# Patient Record
Sex: Female | Born: 1940
Health system: Southern US, Community
[De-identification: ages and names within clinical notes are randomized; demographics above are authoritative.]

## PROBLEM LIST (undated history)

## (undated) DIAGNOSIS — M1990 Unspecified osteoarthritis, unspecified site: Secondary | ICD-10-CM

## (undated) DIAGNOSIS — D239 Other benign neoplasm of skin, unspecified: Secondary | ICD-10-CM

## (undated) DIAGNOSIS — M8580 Other specified disorders of bone density and structure, unspecified site: Secondary | ICD-10-CM

## (undated) DIAGNOSIS — K209 Esophagitis, unspecified without bleeding: Secondary | ICD-10-CM

## (undated) DIAGNOSIS — K222 Esophageal obstruction: Secondary | ICD-10-CM

## (undated) DIAGNOSIS — K219 Gastro-esophageal reflux disease without esophagitis: Secondary | ICD-10-CM

## (undated) DIAGNOSIS — K449 Diaphragmatic hernia without obstruction or gangrene: Secondary | ICD-10-CM

## (undated) DIAGNOSIS — IMO0002 Reserved for concepts with insufficient information to code with codable children: Secondary | ICD-10-CM

## (undated) DIAGNOSIS — H269 Unspecified cataract: Secondary | ICD-10-CM

## (undated) DIAGNOSIS — T7840XA Allergy, unspecified, initial encounter: Secondary | ICD-10-CM

## (undated) DIAGNOSIS — F32A Depression, unspecified: Secondary | ICD-10-CM

## (undated) DIAGNOSIS — F419 Anxiety disorder, unspecified: Secondary | ICD-10-CM

## (undated) DIAGNOSIS — Z86010 Personal history of colonic polyps: Secondary | ICD-10-CM

## (undated) DIAGNOSIS — G4730 Sleep apnea, unspecified: Secondary | ICD-10-CM

## (undated) DIAGNOSIS — K5792 Diverticulitis of intestine, part unspecified, without perforation or abscess without bleeding: Secondary | ICD-10-CM

## (undated) DIAGNOSIS — H6990 Unspecified Eustachian tube disorder, unspecified ear: Secondary | ICD-10-CM

## (undated) DIAGNOSIS — N952 Postmenopausal atrophic vaginitis: Secondary | ICD-10-CM

## (undated) DIAGNOSIS — I341 Nonrheumatic mitral (valve) prolapse: Secondary | ICD-10-CM

## (undated) DIAGNOSIS — D649 Anemia, unspecified: Secondary | ICD-10-CM

## (undated) DIAGNOSIS — K5790 Diverticulosis of intestine, part unspecified, without perforation or abscess without bleeding: Secondary | ICD-10-CM

## (undated) DIAGNOSIS — J189 Pneumonia, unspecified organism: Secondary | ICD-10-CM

## (undated) DIAGNOSIS — K269 Duodenal ulcer, unspecified as acute or chronic, without hemorrhage or perforation: Secondary | ICD-10-CM

## (undated) DIAGNOSIS — H6980 Other specified disorders of Eustachian tube, unspecified ear: Secondary | ICD-10-CM

## (undated) DIAGNOSIS — J45909 Unspecified asthma, uncomplicated: Secondary | ICD-10-CM

## (undated) DIAGNOSIS — R42 Dizziness and giddiness: Secondary | ICD-10-CM

## (undated) DIAGNOSIS — E785 Hyperlipidemia, unspecified: Secondary | ICD-10-CM

## (undated) HISTORY — DX: Dizziness and giddiness: R42

## (undated) HISTORY — DX: Nonrheumatic mitral (valve) prolapse: I34.1

## (undated) HISTORY — DX: Diverticulosis of intestine, part unspecified, without perforation or abscess without bleeding: K57.90

## (undated) HISTORY — DX: Other specified disorders of bone density and structure, unspecified site: M85.80

## (undated) HISTORY — DX: Unspecified osteoarthritis, unspecified site: M19.90

## (undated) HISTORY — DX: Unspecified eustachian tube disorder, unspecified ear: H69.90

## (undated) HISTORY — DX: Anemia, unspecified: D64.9

## (undated) HISTORY — DX: Personal history of colonic polyps: Z86.010

## (undated) HISTORY — DX: Diverticulitis of intestine, part unspecified, without perforation or abscess without bleeding: K57.92

## (undated) HISTORY — DX: Postmenopausal atrophic vaginitis: N95.2

## (undated) HISTORY — DX: Gastro-esophageal reflux disease without esophagitis: K21.9

## (undated) HISTORY — DX: Reserved for concepts with insufficient information to code with codable children: IMO0002

## (undated) HISTORY — DX: Other specified disorders of Eustachian tube, unspecified ear: H69.80

## (undated) HISTORY — DX: Duodenal ulcer, unspecified as acute or chronic, without hemorrhage or perforation: K26.9

## (undated) HISTORY — DX: Sleep apnea, unspecified: G47.30

## (undated) HISTORY — DX: Allergy, unspecified, initial encounter: T78.40XA

## (undated) HISTORY — DX: Esophagitis, unspecified without bleeding: K20.90

## (undated) HISTORY — DX: Diaphragmatic hernia without obstruction or gangrene: K44.9

## (undated) HISTORY — DX: Unspecified cataract: H26.9

## (undated) HISTORY — DX: Esophageal obstruction: K22.2

## (undated) HISTORY — DX: Esophagitis, unspecified: K20.9

---
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
## 1967-05-14 HISTORY — PX: BREAST BIOPSY: SHX20

## 1974-05-13 HISTORY — PX: SEPTOPLASTY: SUR1290

## 1987-05-14 HISTORY — PX: APPENDECTOMY: SHX54

## 1993-05-13 DIAGNOSIS — K222 Esophageal obstruction: Secondary | ICD-10-CM

## 1993-05-13 HISTORY — DX: Esophageal obstruction: K22.2

## 1997-08-10 ENCOUNTER — Other Ambulatory Visit: Admission: RE | Admit: 1997-08-10 | Discharge: 1997-08-10 | Payer: Self-pay | Admitting: Obstetrics & Gynecology

## 1998-08-15 ENCOUNTER — Other Ambulatory Visit: Admission: RE | Admit: 1998-08-15 | Discharge: 1998-08-15 | Payer: Self-pay | Admitting: Obstetrics & Gynecology

## 1999-05-14 HISTORY — PX: ABDOMINAL HYSTERECTOMY: SHX81

## 1999-09-10 ENCOUNTER — Other Ambulatory Visit: Admission: RE | Admit: 1999-09-10 | Discharge: 1999-09-10 | Payer: Self-pay | Admitting: Obstetrics & Gynecology

## 1999-09-24 ENCOUNTER — Inpatient Hospital Stay (HOSPITAL_COMMUNITY): Admission: RE | Admit: 1999-09-24 | Discharge: 1999-09-28 | Payer: Self-pay | Admitting: Obstetrics & Gynecology

## 2000-04-03 ENCOUNTER — Ambulatory Visit: Admission: RE | Admit: 2000-04-03 | Discharge: 2000-04-03 | Payer: Self-pay | Admitting: Obstetrics & Gynecology

## 2000-04-04 ENCOUNTER — Ambulatory Visit (HOSPITAL_COMMUNITY): Admission: RE | Admit: 2000-04-04 | Discharge: 2000-04-04 | Payer: Self-pay | Admitting: Obstetrics & Gynecology

## 2000-04-04 ENCOUNTER — Encounter: Payer: Self-pay | Admitting: Obstetrics & Gynecology

## 2000-04-12 HISTORY — PX: LAPAROSCOPY: SHX197

## 2000-04-15 ENCOUNTER — Encounter: Payer: Self-pay | Admitting: Obstetrics & Gynecology

## 2000-04-15 ENCOUNTER — Ambulatory Visit (HOSPITAL_COMMUNITY): Admission: RE | Admit: 2000-04-15 | Discharge: 2000-04-15 | Payer: Self-pay | Admitting: Obstetrics & Gynecology

## 2000-04-25 ENCOUNTER — Ambulatory Visit (HOSPITAL_COMMUNITY): Admission: RE | Admit: 2000-04-25 | Discharge: 2000-04-25 | Payer: Self-pay | Admitting: Obstetrics & Gynecology

## 2000-04-25 ENCOUNTER — Encounter (INDEPENDENT_AMBULATORY_CARE_PROVIDER_SITE_OTHER): Payer: Self-pay | Admitting: Specialist

## 2002-09-21 ENCOUNTER — Other Ambulatory Visit: Admission: RE | Admit: 2002-09-21 | Discharge: 2002-09-21 | Payer: Self-pay | Admitting: Obstetrics & Gynecology

## 2003-03-10 ENCOUNTER — Ambulatory Visit (HOSPITAL_COMMUNITY): Admission: RE | Admit: 2003-03-10 | Discharge: 2003-03-11 | Payer: Self-pay | Admitting: *Deleted

## 2003-09-13 ENCOUNTER — Ambulatory Visit (HOSPITAL_COMMUNITY): Admission: RE | Admit: 2003-09-13 | Discharge: 2003-09-13 | Payer: Self-pay | Admitting: Orthopedic Surgery

## 2004-01-12 HISTORY — PX: CERVICAL DISCECTOMY: SHX98

## 2004-01-12 HISTORY — PX: SHOULDER SURGERY: SHX246

## 2004-01-19 ENCOUNTER — Inpatient Hospital Stay (HOSPITAL_COMMUNITY): Admission: RE | Admit: 2004-01-19 | Discharge: 2004-01-20 | Payer: Self-pay | Admitting: Neurosurgery

## 2004-03-22 ENCOUNTER — Ambulatory Visit: Payer: Self-pay | Admitting: Internal Medicine

## 2004-05-01 ENCOUNTER — Ambulatory Visit: Payer: Self-pay | Admitting: Internal Medicine

## 2004-09-12 ENCOUNTER — Ambulatory Visit: Payer: Self-pay | Admitting: Internal Medicine

## 2004-09-19 ENCOUNTER — Ambulatory Visit: Payer: Self-pay | Admitting: Internal Medicine

## 2004-09-25 ENCOUNTER — Ambulatory Visit: Payer: Self-pay | Admitting: Family Medicine

## 2004-10-11 DIAGNOSIS — G4730 Sleep apnea, unspecified: Secondary | ICD-10-CM

## 2004-10-11 HISTORY — DX: Sleep apnea, unspecified: G47.30

## 2004-10-12 ENCOUNTER — Ambulatory Visit: Payer: Self-pay | Admitting: Internal Medicine

## 2004-10-17 ENCOUNTER — Ambulatory Visit (HOSPITAL_BASED_OUTPATIENT_CLINIC_OR_DEPARTMENT_OTHER): Admission: RE | Admit: 2004-10-17 | Discharge: 2004-10-17 | Payer: Self-pay | Admitting: Internal Medicine

## 2004-10-26 ENCOUNTER — Ambulatory Visit: Payer: Self-pay | Admitting: Pulmonary Disease

## 2004-12-24 ENCOUNTER — Ambulatory Visit: Payer: Self-pay | Admitting: Pulmonary Disease

## 2005-03-06 ENCOUNTER — Ambulatory Visit: Payer: Self-pay | Admitting: Internal Medicine

## 2005-09-12 ENCOUNTER — Ambulatory Visit: Payer: Self-pay | Admitting: Internal Medicine

## 2005-09-23 ENCOUNTER — Ambulatory Visit: Payer: Self-pay | Admitting: Internal Medicine

## 2006-01-21 ENCOUNTER — Ambulatory Visit: Payer: Self-pay | Admitting: Internal Medicine

## 2006-02-28 ENCOUNTER — Ambulatory Visit: Payer: Self-pay | Admitting: Internal Medicine

## 2006-04-12 HISTORY — PX: OTHER SURGICAL HISTORY: SHX169

## 2006-04-18 ENCOUNTER — Encounter (INDEPENDENT_AMBULATORY_CARE_PROVIDER_SITE_OTHER): Payer: Self-pay | Admitting: Specialist

## 2006-04-18 ENCOUNTER — Ambulatory Visit (HOSPITAL_BASED_OUTPATIENT_CLINIC_OR_DEPARTMENT_OTHER): Admission: RE | Admit: 2006-04-18 | Discharge: 2006-04-18 | Payer: Self-pay | Admitting: Orthopedic Surgery

## 2006-08-26 ENCOUNTER — Encounter: Admission: RE | Admit: 2006-08-26 | Discharge: 2006-08-26 | Payer: Self-pay | Admitting: Neurosurgery

## 2006-09-01 ENCOUNTER — Ambulatory Visit: Payer: Self-pay | Admitting: Internal Medicine

## 2006-09-16 ENCOUNTER — Ambulatory Visit: Payer: Self-pay | Admitting: Internal Medicine

## 2006-10-01 ENCOUNTER — Ambulatory Visit: Payer: Self-pay | Admitting: Internal Medicine

## 2006-10-01 LAB — CONVERTED CEMR LAB
BUN: 14 mg/dL (ref 6–23)
Basophils Absolute: 0 10*3/uL (ref 0.0–0.1)
Basophils Relative: 0.4 % (ref 0.0–1.0)
CO2: 31 meq/L (ref 19–32)
Calcium: 9.4 mg/dL (ref 8.4–10.5)
Chloride: 104 meq/L (ref 96–112)
Cholesterol: 246 mg/dL (ref 0–200)
Creatinine, Ser: 0.7 mg/dL (ref 0.4–1.2)
Direct LDL: 115 mg/dL
Eosinophils Absolute: 0.2 10*3/uL (ref 0.0–0.6)
Eosinophils Relative: 4.5 % (ref 0.0–5.0)
GFR calc Af Amer: 107 mL/min
GFR calc non Af Amer: 89 mL/min
Glucose, Bld: 101 mg/dL — ABNORMAL HIGH (ref 70–99)
HCT: 40.8 % (ref 36.0–46.0)
HDL: 92 mg/dL (ref >39.0)
Hemoglobin: 14 g/dL (ref 12.0–15.0)
Lymphocytes Relative: 30 % (ref 12.0–46.0)
MCHC: 34.2 g/dL (ref 30.0–36.0)
MCV: 91.9 fL (ref 78.0–100.0)
Monocytes Absolute: 0.4 10*3/uL (ref 0.2–0.7)
Monocytes Relative: 7.9 % (ref 3.0–11.0)
Neutro Abs: 3.2 10*3/uL (ref 1.4–7.7)
Neutrophils Relative %: 57.2 % (ref 43.0–77.0)
Platelets: 224 10*3/uL (ref 150–400)
Potassium: 4.5 meq/L (ref 3.5–5.1)
RBC: 4.44 M/uL (ref 3.87–5.11)
RDW: 12.5 % (ref 11.5–14.6)
Sodium: 140 meq/L (ref 135–145)
Total CHOL/HDL Ratio: 2.7
Triglycerides: 46 mg/dL (ref 0–149)
VLDL: 9 mg/dL (ref 0–40)
WBC: 5.5 10*3/uL (ref 4.5–10.5)

## 2006-10-09 ENCOUNTER — Ambulatory Visit: Payer: Self-pay | Admitting: Internal Medicine

## 2006-10-14 ENCOUNTER — Ambulatory Visit: Payer: Self-pay | Admitting: Internal Medicine

## 2006-11-10 ENCOUNTER — Ambulatory Visit: Payer: Self-pay | Admitting: Internal Medicine

## 2006-12-29 ENCOUNTER — Encounter: Payer: Self-pay | Admitting: Internal Medicine

## 2006-12-29 DIAGNOSIS — Z8719 Personal history of other diseases of the digestive system: Secondary | ICD-10-CM | POA: Insufficient documentation

## 2006-12-29 DIAGNOSIS — I059 Rheumatic mitral valve disease, unspecified: Secondary | ICD-10-CM | POA: Insufficient documentation

## 2006-12-29 DIAGNOSIS — J309 Allergic rhinitis, unspecified: Secondary | ICD-10-CM | POA: Insufficient documentation

## 2006-12-29 DIAGNOSIS — K219 Gastro-esophageal reflux disease without esophagitis: Secondary | ICD-10-CM | POA: Insufficient documentation

## 2007-02-23 ENCOUNTER — Ambulatory Visit: Payer: Self-pay | Admitting: Internal Medicine

## 2007-06-19 ENCOUNTER — Encounter: Payer: Self-pay | Admitting: Internal Medicine

## 2007-06-30 ENCOUNTER — Encounter: Payer: Self-pay | Admitting: Internal Medicine

## 2007-09-16 ENCOUNTER — Encounter: Payer: Self-pay | Admitting: Internal Medicine

## 2007-11-30 ENCOUNTER — Ambulatory Visit: Payer: Self-pay | Admitting: Internal Medicine

## 2007-11-30 LAB — CONVERTED CEMR LAB
ALT: 26 units/L (ref 0–35)
AST: 34 units/L (ref 0–37)
Albumin: 4.3 g/dL (ref 3.5–5.2)
Alkaline Phosphatase: 66 units/L (ref 39–117)
BUN: 16 mg/dL (ref 6–23)
Basophils Absolute: 0 10*3/uL (ref 0.0–0.1)
Basophils Relative: 0.3 % (ref 0.0–3.0)
Bilirubin Urine: NEGATIVE
Bilirubin, Direct: 0.2 mg/dL (ref 0.0–0.3)
CO2: 30 meq/L (ref 19–32)
Calcium: 9.4 mg/dL (ref 8.4–10.5)
Chloride: 102 meq/L (ref 96–112)
Cholesterol: 253 mg/dL (ref 0–200)
Creatinine, Ser: 0.8 mg/dL (ref 0.4–1.2)
Direct LDL: 117.9 mg/dL
Eosinophils Absolute: 0.1 10*3/uL (ref 0.0–0.7)
Eosinophils Relative: 2.7 % (ref 0.0–5.0)
GFR calc Af Amer: 92 mL/min
GFR calc non Af Amer: 76 mL/min
Glucose, Bld: 100 mg/dL — ABNORMAL HIGH (ref 70–99)
HCT: 38.7 % (ref 36.0–46.0)
HDL: 105.8 mg/dL (ref >39.0)
Hemoglobin, Urine: NEGATIVE
Hemoglobin: 13.2 g/dL (ref 12.0–15.0)
Leukocytes, UA: NEGATIVE
Lymphocytes Relative: 23.6 % (ref 12.0–46.0)
MCHC: 34.1 g/dL (ref 30.0–36.0)
MCV: 94.6 fL (ref 78.0–100.0)
Monocytes Absolute: 0.4 10*3/uL (ref 0.1–1.0)
Monocytes Relative: 7.6 % (ref 3.0–12.0)
Neutro Abs: 3.7 10*3/uL (ref 1.4–7.7)
Neutrophils Relative %: 65.8 % (ref 43.0–77.0)
Nitrite: NEGATIVE
Platelets: 217 10*3/uL (ref 150–400)
Potassium: 4.1 meq/L (ref 3.5–5.1)
RBC: 4.09 M/uL (ref 3.87–5.11)
RDW: 12.6 % (ref 11.5–14.6)
Sodium: 139 meq/L (ref 135–145)
Specific Gravity, Urine: 1.01 (ref 1.000–1.03)
TSH: 1.34 microintl units/mL (ref 0.35–5.50)
Total Bilirubin: 1.5 mg/dL — ABNORMAL HIGH (ref 0.3–1.2)
Total CHOL/HDL Ratio: 2.4
Total Protein, Urine: NEGATIVE mg/dL
Total Protein: 7.1 g/dL (ref 6.0–8.3)
Triglycerides: 41 mg/dL (ref 0–149)
Urine Glucose: NEGATIVE mg/dL
Urobilinogen, UA: 0.2 (ref 0.0–1.0)
VLDL: 8 mg/dL (ref 0–40)
WBC: 5.5 10*3/uL (ref 4.5–10.5)
pH: 5.5 (ref 5.0–8.0)

## 2007-12-08 ENCOUNTER — Ambulatory Visit: Payer: Self-pay | Admitting: Internal Medicine

## 2007-12-08 DIAGNOSIS — M26629 Arthralgia of temporomandibular joint, unspecified side: Secondary | ICD-10-CM | POA: Insufficient documentation

## 2007-12-08 DIAGNOSIS — M949 Disorder of cartilage, unspecified: Secondary | ICD-10-CM

## 2007-12-08 DIAGNOSIS — Z8669 Personal history of other diseases of the nervous system and sense organs: Secondary | ICD-10-CM | POA: Insufficient documentation

## 2007-12-08 DIAGNOSIS — M899 Disorder of bone, unspecified: Secondary | ICD-10-CM | POA: Insufficient documentation

## 2008-02-26 ENCOUNTER — Ambulatory Visit: Payer: Self-pay | Admitting: Internal Medicine

## 2008-03-16 ENCOUNTER — Ambulatory Visit: Payer: Self-pay | Admitting: Internal Medicine

## 2008-03-16 DIAGNOSIS — R0789 Other chest pain: Secondary | ICD-10-CM | POA: Insufficient documentation

## 2008-03-24 ENCOUNTER — Encounter: Payer: Self-pay | Admitting: Internal Medicine

## 2008-03-24 ENCOUNTER — Ambulatory Visit: Payer: Self-pay

## 2008-04-08 ENCOUNTER — Encounter: Payer: Self-pay | Admitting: Internal Medicine

## 2008-06-21 ENCOUNTER — Encounter: Payer: Self-pay | Admitting: Internal Medicine

## 2008-10-11 ENCOUNTER — Telehealth: Payer: Self-pay | Admitting: Internal Medicine

## 2008-11-08 ENCOUNTER — Encounter: Payer: Self-pay | Admitting: Internal Medicine

## 2008-11-08 ENCOUNTER — Ambulatory Visit: Payer: Self-pay | Admitting: Internal Medicine

## 2008-11-22 ENCOUNTER — Telehealth: Payer: Self-pay | Admitting: Internal Medicine

## 2008-11-30 ENCOUNTER — Ambulatory Visit: Payer: Self-pay | Admitting: Internal Medicine

## 2008-11-30 LAB — CONVERTED CEMR LAB
ALT: 22 units/L (ref 0–35)
AST: 27 units/L (ref 0–37)
Albumin: 4.3 g/dL (ref 3.5–5.2)
Alkaline Phosphatase: 81 units/L (ref 39–117)
BUN: 15 mg/dL (ref 6–23)
Basophils Absolute: 0 10*3/uL (ref 0.0–0.1)
Basophils Relative: 0.1 % (ref 0.0–3.0)
Bilirubin Urine: NEGATIVE
Bilirubin, Direct: 0.1 mg/dL (ref 0.0–0.3)
CO2: 33 meq/L — ABNORMAL HIGH (ref 19–32)
Calcium: 9.5 mg/dL (ref 8.4–10.5)
Chloride: 105 meq/L (ref 96–112)
Cholesterol: 231 mg/dL — ABNORMAL HIGH (ref 0–200)
Creatinine, Ser: 0.7 mg/dL (ref 0.4–1.2)
Direct LDL: 107.7 mg/dL
Eosinophils Absolute: 0.2 10*3/uL (ref 0.0–0.7)
Eosinophils Relative: 3 % (ref 0.0–5.0)
GFR calc non Af Amer: 88.04 mL/min (ref >60)
Glucose, Bld: 98 mg/dL (ref 70–99)
HCT: 39.5 % (ref 36.0–46.0)
HDL: 104.1 mg/dL (ref >39.00)
Hemoglobin, Urine: NEGATIVE
Hemoglobin: 13.3 g/dL (ref 12.0–15.0)
Leukocytes, UA: NEGATIVE
Lymphocytes Relative: 27.2 % (ref 12.0–46.0)
Lymphs Abs: 1.4 10*3/uL (ref 0.7–4.0)
MCHC: 33.8 g/dL (ref 30.0–36.0)
MCV: 93.4 fL (ref 78.0–100.0)
Monocytes Absolute: 0.5 10*3/uL (ref 0.1–1.0)
Monocytes Relative: 8.9 % (ref 3.0–12.0)
Neutro Abs: 3.1 10*3/uL (ref 1.4–7.7)
Neutrophils Relative %: 60.8 % (ref 43.0–77.0)
Nitrite: NEGATIVE
Platelets: 204 10*3/uL (ref 150.0–400.0)
Potassium: 4.3 meq/L (ref 3.5–5.1)
RBC: 4.23 M/uL (ref 3.87–5.11)
RDW: 12.5 % (ref 11.5–14.6)
Sodium: 143 meq/L (ref 135–145)
Specific Gravity, Urine: 1.025 (ref 1.000–1.030)
TSH: 0.75 microintl units/mL (ref 0.35–5.50)
Total Bilirubin: 1.3 mg/dL — ABNORMAL HIGH (ref 0.3–1.2)
Total CHOL/HDL Ratio: 2
Total Protein, Urine: NEGATIVE mg/dL
Total Protein: 7.4 g/dL (ref 6.0–8.3)
Triglycerides: 39 mg/dL (ref 0.0–149.0)
Urine Glucose: NEGATIVE mg/dL
Urobilinogen, UA: 0.2 (ref 0.0–1.0)
VLDL: 7.8 mg/dL (ref 0.0–40.0)
WBC: 5.2 10*3/uL (ref 4.5–10.5)
pH: 6 (ref 5.0–8.0)

## 2008-12-08 ENCOUNTER — Ambulatory Visit: Payer: Self-pay | Admitting: Internal Medicine

## 2008-12-09 ENCOUNTER — Telehealth: Payer: Self-pay | Admitting: Internal Medicine

## 2008-12-30 ENCOUNTER — Encounter (INDEPENDENT_AMBULATORY_CARE_PROVIDER_SITE_OTHER): Payer: Self-pay | Admitting: *Deleted

## 2009-02-08 ENCOUNTER — Ambulatory Visit: Payer: Self-pay | Admitting: Internal Medicine

## 2009-02-10 DIAGNOSIS — Z860101 Personal history of adenomatous and serrated colon polyps: Secondary | ICD-10-CM

## 2009-02-10 DIAGNOSIS — Z86010 Personal history of colonic polyps: Secondary | ICD-10-CM

## 2009-02-10 HISTORY — DX: Personal history of colonic polyps: Z86.010

## 2009-02-10 HISTORY — DX: Personal history of adenomatous and serrated colon polyps: Z86.0101

## 2009-02-14 ENCOUNTER — Ambulatory Visit: Payer: Self-pay | Admitting: Internal Medicine

## 2009-02-23 ENCOUNTER — Ambulatory Visit: Payer: Self-pay | Admitting: Internal Medicine

## 2009-02-23 LAB — CONVERTED CEMR LAB
Nitrite: POSITIVE
Specific Gravity, Urine: >=1.03 (ref 1.000–1.030)
Total Protein, Urine: >=300 mg/dL
Urine Glucose: NEGATIVE mg/dL
Urobilinogen, UA: 1 (ref 0.0–1.0)
pH: 5 (ref 5.0–8.0)

## 2009-02-24 ENCOUNTER — Telehealth: Payer: Self-pay | Admitting: Internal Medicine

## 2009-03-06 ENCOUNTER — Encounter: Payer: Self-pay | Admitting: Internal Medicine

## 2009-03-06 ENCOUNTER — Ambulatory Visit: Payer: Self-pay | Admitting: Internal Medicine

## 2009-03-06 LAB — HM COLONOSCOPY

## 2009-03-07 ENCOUNTER — Encounter: Payer: Self-pay | Admitting: Internal Medicine

## 2009-05-20 ENCOUNTER — Ambulatory Visit: Payer: Self-pay | Admitting: Family Medicine

## 2009-05-30 ENCOUNTER — Ambulatory Visit: Payer: Self-pay | Admitting: Internal Medicine

## 2009-06-12 ENCOUNTER — Telehealth: Payer: Self-pay | Admitting: Internal Medicine

## 2009-06-20 ENCOUNTER — Encounter: Payer: Self-pay | Admitting: Internal Medicine

## 2009-06-22 ENCOUNTER — Encounter: Payer: Self-pay | Admitting: Internal Medicine

## 2009-06-22 LAB — HM MAMMOGRAPHY: HM Mammogram: NORMAL

## 2009-06-28 ENCOUNTER — Telehealth (INDEPENDENT_AMBULATORY_CARE_PROVIDER_SITE_OTHER): Payer: Self-pay | Admitting: *Deleted

## 2009-07-26 ENCOUNTER — Ambulatory Visit: Payer: Self-pay | Admitting: Internal Medicine

## 2009-07-31 ENCOUNTER — Telehealth: Payer: Self-pay | Admitting: Internal Medicine

## 2009-12-04 ENCOUNTER — Ambulatory Visit: Payer: Self-pay | Admitting: Internal Medicine

## 2009-12-04 LAB — CONVERTED CEMR LAB
ALT: 19 units/L (ref 0–35)
AST: 26 units/L (ref 0–37)
Albumin: 4.3 g/dL (ref 3.5–5.2)
Alkaline Phosphatase: 68 units/L (ref 39–117)
BUN: 18 mg/dL (ref 6–23)
Basophils Absolute: 0 10*3/uL (ref 0.0–0.1)
Basophils Relative: 0.4 % (ref 0.0–3.0)
Bilirubin Urine: NEGATIVE
Bilirubin, Direct: 0.1 mg/dL (ref 0.0–0.3)
CO2: 28 meq/L (ref 19–32)
Calcium: 9.4 mg/dL (ref 8.4–10.5)
Chloride: 103 meq/L (ref 96–112)
Cholesterol: 250 mg/dL — ABNORMAL HIGH (ref 0–200)
Creatinine, Ser: 0.7 mg/dL (ref 0.4–1.2)
Direct LDL: 123.3 mg/dL
Eosinophils Absolute: 0.1 10*3/uL (ref 0.0–0.7)
Eosinophils Relative: 2.7 % (ref 0.0–5.0)
GFR calc non Af Amer: 87.79 mL/min (ref >60)
Glucose, Bld: 100 mg/dL — ABNORMAL HIGH (ref 70–99)
HCT: 37.8 % (ref 36.0–46.0)
HDL: 107.7 mg/dL (ref >39.00)
Hemoglobin, Urine: NEGATIVE
Hemoglobin: 12.9 g/dL (ref 12.0–15.0)
Ketones, ur: NEGATIVE mg/dL
Leukocytes, UA: NEGATIVE
Lymphocytes Relative: 24.5 % (ref 12.0–46.0)
Lymphs Abs: 1.2 10*3/uL (ref 0.7–4.0)
MCHC: 34.2 g/dL (ref 30.0–36.0)
MCV: 94.3 fL (ref 78.0–100.0)
Monocytes Absolute: 0.4 10*3/uL (ref 0.1–1.0)
Monocytes Relative: 8.8 % (ref 3.0–12.0)
Neutro Abs: 3.1 10*3/uL (ref 1.4–7.7)
Neutrophils Relative %: 63.6 % (ref 43.0–77.0)
Nitrite: NEGATIVE
Platelets: 205 10*3/uL (ref 150.0–400.0)
Potassium: 4.6 meq/L (ref 3.5–5.1)
RBC: 4.01 M/uL (ref 3.87–5.11)
RDW: 13.4 % (ref 11.5–14.6)
Sodium: 137 meq/L (ref 135–145)
Specific Gravity, Urine: >=1.03 (ref 1.000–1.030)
TSH: 0.79 microintl units/mL (ref 0.35–5.50)
Total Bilirubin: 0.9 mg/dL (ref 0.3–1.2)
Total CHOL/HDL Ratio: 2
Total Protein, Urine: NEGATIVE mg/dL
Total Protein: 6.9 g/dL (ref 6.0–8.3)
Triglycerides: 49 mg/dL (ref 0.0–149.0)
Urine Glucose: NEGATIVE mg/dL
Urobilinogen, UA: 0.2 (ref 0.0–1.0)
VLDL: 9.8 mg/dL (ref 0.0–40.0)
WBC: 4.9 10*3/uL (ref 4.5–10.5)
pH: 5.5 (ref 5.0–8.0)

## 2009-12-11 ENCOUNTER — Ambulatory Visit: Payer: Self-pay | Admitting: Internal Medicine

## 2009-12-11 DIAGNOSIS — N952 Postmenopausal atrophic vaginitis: Secondary | ICD-10-CM | POA: Insufficient documentation

## 2009-12-11 DIAGNOSIS — R197 Diarrhea, unspecified: Secondary | ICD-10-CM | POA: Insufficient documentation

## 2010-02-16 ENCOUNTER — Ambulatory Visit: Payer: Self-pay | Admitting: Internal Medicine

## 2010-06-12 NOTE — Progress Notes (Signed)
Summary: Ear Problem   Phone Note Call from Patient Call back at Home Phone [phone]   Summary of Call: Pt took sudafed x 2 wks as advised and she continues to c/o ear "stuffyness". Please advise.  Initial call taken by: Lamar Sprinkles, CMA,  June 12, 2009 4:34 PM  Follow-up for Phone Call        for persistent problem had planned to refer to ENT. [facility] notifiec. Follow-up by: Jacques Navy MD,  June 12, 2009 6:02 PM  Additional Follow-up for Phone Call Additional follow up Details #1::        left mess to call office back.......................Marland KitchenLamar Sprinkles, CMA  June 12, 2009 6:20 PM     Additional Follow-up for Phone Call Additional follow up Details #2::    Patient notified Follow-up by: Rock Nephew CMA,  June 13, 2009 9:48 AM

## 2010-06-12 NOTE — Assessment & Plan Note (Signed)
Summary: flu shot-lb   Nurse Visit   Allergies: 1)  ! Codeine  Orders Added: 1)  Flu Vaccine 60yrs + MEDICARE PATIENTS [Q2039] 2)  Administration Flu vaccine - MCR [G0008] Flu Vaccine Consent Questions     Do you have a history of severe allergic reactions to this vaccine? no    Any prior history of allergic reactions to egg and/or gelatin? no    Do you have a sensitivity to the preservative Thimersol? no    Do you have a past history of Guillan-Barre Syndrome? no    Do you currently have an acute febrile illness? no    Have you ever had a severe reaction to latex? no    Vaccine information given and explained to patient? yes    Are you currently pregnant? no    Lot Number:[id number]   Exp Date:11/10/2010   Site Given  Left Deltoid IM

## 2010-06-12 NOTE — Progress Notes (Signed)
Summary: Cough/phlem  Phone Note Call from Patient Call back at [facility] Phone [phone]   Summary of Call: At last office visit patient was told to call back if not better. Per patient she still has a cough/phlem despite meds and is feeling better. Patient would like to know if she should repeat Z-Pak, or come into the office, or if MD has other recommendations. Please advise. Initial call taken by: Lucious Groves,  July 31, 2009 2:06 PM  Follow-up for Phone Call        still has azithromycin on board thru  3/26th. if sicker she needs office visit. Follow-up by: Jacques Navy MD,  July 31, 2009 5:44 PM  Additional Follow-up for Phone Call Additional follow up Details #1::        left mess to call office back................................Marland KitchenLamar Sprinkles, CMA  July 31, 2009 6:15 PM     Additional Follow-up for Phone Call Additional follow up Details #2::    Pt informed  Follow-up by: Lamar Sprinkles, CMA,  August 01, 2009 5:18 PM

## 2010-06-12 NOTE — Assessment & Plan Note (Signed)
Summary: DR MEN PT--CHEST CONGESTION-NO FEVER--STC   Vital Signs:  Patient profile:   70 year old female Height:      62 inches (157.48 cm) Weight:      114.12 pounds (51.87 kg) O2 Sat:      96 % on Room air Temp:     99.5 degrees F (37.50 degrees C) oral Pulse rate:   94 / minute BP sitting:   132 / 62  (left arm) Cuff size:   regular  Vitals Entered By: Orlan Leavens (July 26, 2009 1:33 PM)  O2 Flow:  Room air CC: chest congestion/ sore throat, URI symptoms Is Patient Diabetic? No Pain Assessment Patient in pain? no        Primary Care Provider:  Jacques Navy MD  CC:  chest congestion/ sore throat and URI symptoms.  History of Present Illness:  URI Symptoms      This is a 70 year old woman who presents with URI symptoms.  The symptoms began 4 days ago.  The severity is described as moderate.  The patient reports sore throat, dry cough, and sick contacts, but denies nasal congestion.  Associated symptoms include low-grade fever (<100.5 degrees).  The patient denies stiff neck, dyspnea, and wheezing.  The patient also reports seasonal symptoms.  The patient denies headache, muscle aches, and severe fatigue.  Risk factors for Strep sinusitis include Strep exposure.  The patient denies the following risk factors for Strep sinusitis: tender adenopathy.    Current Medications (verified): 1)  Vitamin E 400 Unit  Caps (Vitamin E) .... Take 1 Tablet By Mouth Once A Day 2)  Caltrate 600 1500 Mg  Tabs (Calcium Carbonate) .... Take 1 Tablet By Mouth Two Times A Day 3)  Glucosamine-Chondroitin   Caps (Glucosamine-Chondroit-Vit C-Mn) .... Take 1 Tablet By Mouth Two Times A Day 4)  Multivitamins   Tabs (Multiple Vitamin) .... Take 1 Tablet By Mouth Once A Day 5)  Aspirin 81 Mg  Tabs (Aspirin) .... Take 1 Tablet By Mouth Every Other Day 6)  Prilosec 20 Mg  Cpdr (Omeprazole) .... Once Daily 7)  Fosamax 70 Mg  Tabs (Alendronate Sodium) .Marland Kitchen.. 1 Every Week 8)  Liquid Minerals .... 2  Daily 9)  B 12 .... Once Daily 10)  Fluticasone Propionate 50 Mcg/act  Susp (Fluticasone Propionate) .Marland Kitchen.. 1 Spray Each Nostril Daily As Needed 11)  Fish Oil   Oil (Fish Oil) .... Daily  Allergies (verified): 1)  ! Codeine  Past History:  Past Medical History: Allergic rhinitis Diverticulitis, hx of GERD atrophic vaginitis      Review of Systems  The patient denies hoarseness, chest pain, headaches, and hemoptysis.    Physical Exam  General:  Well-developed,well-nourished,in no acute distress; alert,appropriate and cooperative throughout examination Eyes:  vision grossly intact; pupils equal, round and reactive to light.  conjunctiva and lids normal.    Ears:  normal pinnae bilaterally, without erythema, swelling, or tenderness to palpation. TMs clear, without effusion, or cerumen impaction. Hearing grossly normal bilaterally  Mouth:  teeth and gums in good repair; mucous membranes moist, without lesions or ulcers. oropharynx without exudate, mod erythema. +tiny vesicles in OP Lungs:  normal respiratory effort, no intercostal retractions or use of accessory muscles; normal breath sounds bilaterally - no crackles and no wheezes.    Heart:  normal rate and regular rhythm.     Impression & Recommendations:  Problem # 1:  PHARYNGITIS, ACUTE (ICD-462)  suspect viral but +strep exp with g-kids  so tx with zpack - phenrgan vc syrup for night cough - cont robitussin daytime and tylenol as needed - Her updated medication list for this problem includes:    Aspirin 81 Mg Tabs (Aspirin) .Marland Kitchen... Take 1 tablet by mouth every other day    Azithromycin 250 Mg Tabs (Azithromycin) .Marland Kitchen... 2 tabs by mouth today, then 1 by mouth daily starting tomorrow  Orders: Prescription Created Electronically [phone])  Complete Medication List: 1)  Vitamin E 400 Unit Caps (Vitamin e) .... Take 1 tablet by mouth once a day 2)  Caltrate 600 1500 Mg Tabs (Calcium carbonate) .... Take 1 tablet by mouth two times  a day 3)  Glucosamine-chondroitin Caps (Glucosamine-chondroit-vit c-mn) .... Take 1 tablet by mouth two times a day 4)  Multivitamins Tabs (Multiple vitamin) .... Take 1 tablet by mouth once a day 5)  Aspirin 81 Mg Tabs (Aspirin) .... Take 1 tablet by mouth every other day 6)  Prilosec 20 Mg Cpdr (Omeprazole) .... Once daily 7)  Fosamax 70 Mg Tabs (Alendronate sodium) .Marland Kitchen.. 1 every week 8)  Liquid Minerals  .... 2 daily 9)  B 12  .... Once daily 10)  Fluticasone Propionate 50 Mcg/act Susp (Fluticasone propionate) .Marland Kitchen.. 1 spray each nostril daily as needed 11)  Fish Oil Oil (Fish oil) .... Daily 12)  Azithromycin 250 Mg Tabs (Azithromycin) .... 2 tabs by mouth today, then 1 by mouth daily starting tomorrow 13)  Promethazine Vc Plain 6.25-5 Mg/103ml Syrp (Promethazine-phenylephrine) .Marland Kitchen.. 1 tsp by mouth every 4 hours as needed for cough, esp at night  Patient Instructions: 1)  it was good to see you today. 2)  antibiotics and cough syrup as discussed - your prescriptions have been electronically submitted to your pharmacy. Please take as directed. Contact our office if you believe you're having problems with the medication(s).  3)  may continue robitussin during daytime - 4)  Get plenty of rest, drink lots of clear liquids, and use Tylenol or Ibuprofen for fever and comfort. Return in 7-10 days if you're not better:sooner if you're feeling worse. Prescriptions: PROMETHAZINE VC PLAIN 6.25-5 MG/5ML SYRP (PROMETHAZINE-PHENYLEPHRINE) 1 tsp by mouth every 4 hours as needed for cough, esp at night  #6 oz x 0   Entered and Authorized by:   Newt Lukes MD   Signed by:   Newt Lukes MD on 07/26/2009   Method used:   [facility]        [facility] [address] [address] [phone]* (retail)       [address]       [place], [place]  [id number]       Ph: [phone] or [phone]       Fax: [phone]   RxID:   [id number] AZITHROMYCIN 250 MG TABS (AZITHROMYCIN) 2  tabs by mouth today, then 1 by mouth daily starting tomorrow  #6 x 0   Entered and Authorized by:   Newt Lukes MD   Signed by:   Newt Lukes MD on 07/26/2009   Method used:   [facility]        [facility]  [address] [phone]* (retail)       [address]       [place], [place]  [id number]       Ph: [phone] or [phone]       Fax: [phone]   RxID:  [id number]  

## 2010-06-12 NOTE — Consult Note (Signed)
Summary: [facility]  [facility]   Imported By: Sherian Rein 06/27/2009 08:47:07  _____________________________________________________________________  External Attachment:    Type:   Image     Comment:   External Document

## 2010-06-12 NOTE — Assessment & Plan Note (Signed)
Summary: SORE THROAT-CONGEST-HOARSE-DR MEN PT--STC   Vital Signs:  Patient profile:   70 year old female Weight:      112 pounds Temp:     97.3 degrees F oral Pulse rate:   76 / minute Pulse rhythm:   regular BP sitting:   132 / 82  (left arm) Cuff size:   regular  Vitals Entered By: Lowella Petties CMA (May 20, 2009 9:37 AM) CC: Congestion,cough, right ear hurting.   History of Present Illness: 70 yo with over 1 week of sinus congestion. Productive cough, right ear pressure. Has felt feverish with chills. No shortness of breath or wheezing. Has not tried anything OTC yet.  Of note, 38 month old grandchildren with hospitalized with RSV. Husband is having similar symptoms.  Current Medications (verified): 1)  Vitamin E 400 Unit  Caps (Vitamin E) .... Take 1 Tablet By Mouth Once A Day 2)  Caltrate 600 1500 Mg  Tabs (Calcium Carbonate) .... Take 1 Tablet By Mouth Two Times A Day 3)  Glucosamine-Chondroitin   Caps (Glucosamine-Chondroit-Vit C-Mn) .... Take 1 Tablet By Mouth Two Times A Day 4)  Multivitamins   Tabs (Multiple Vitamin) .... Take 1 Tablet By Mouth Once A Day 5)  Aspirin 81 Mg  Tabs (Aspirin) .... Take 1 Tablet By Mouth Every Other Day 6)  Prilosec 20 Mg  Cpdr (Omeprazole) .... Once Daily 7)  Fosamax 70 Mg  Tabs (Alendronate Sodium) .Marland Kitchen.. 1 Every Week 8)  Liquid Minerals .... 2 Daily 9)  B 12 .... Once Daily 10)  Fluticasone Propionate 50 Mcg/act  Susp (Fluticasone Propionate) .Marland Kitchen.. 1 Spray Each Nostril Daily As Needed 11)  Fish Oil   Oil (Fish Oil) .... Daily 12)  Azithromycin 250 Mg  Tabs (Azithromycin) .... 2 By  Mouth Today and Then 1 Daily For 4 Days  Allergies (verified): 1)  ! Codeine  Review of Systems      See HPI General:  Complains of chills, fever, and malaise. CV:  Denies chest pain or discomfort. Resp:  Complains of cough and sputum productive; denies shortness of breath and wheezing.  Physical Exam  General:  alert and well-developed.  NAD. Ears:  clear fluid bilaterally Nose:  mucosal erythema. +/- sinuses   Mouth:  no gingival abnormalities and pharynx pink and moist.   Neck:  no adenopathy Lungs:  normal respiratory effort and normal breath sounds.   Heart:  normal rate and regular rhythm.   Skin:  turgor normal, color normal, no ulcerations, and no edema.   Psych:  Oriented X3, memory intact for recent and remote, normally interactive, good eye contact, and not anxious appearing.     Impression & Recommendations:  Problem # 1:  URI (ICD-465.9) Assessment New MOst likely viral but given duraiton of symptoms with some getting acutely worse, will give prescription for zpack. Advised to fill if symptoms persist over the weekend. See patient instructions. Her updated medication list for this problem includes:    Aspirin 81 Mg Tabs (Aspirin) .Marland Kitchen... Take 1 tablet by mouth every other day  Complete Medication List: 1)  Vitamin E 400 Unit Caps (Vitamin e) .... Take 1 tablet by mouth once a day 2)  Caltrate 600 1500 Mg Tabs (Calcium carbonate) .... Take 1 tablet by mouth two times a day 3)  Glucosamine-chondroitin Caps (Glucosamine-chondroit-vit c-mn) .... Take 1 tablet by mouth two times a day 4)  Multivitamins Tabs (Multiple vitamin) .... Take 1 tablet by mouth once a day 5)  Aspirin 81 Mg Tabs (Aspirin) .... Take 1 tablet by mouth every other day 6)  Prilosec 20 Mg Cpdr (Omeprazole) .... Once daily 7)  Fosamax 70 Mg Tabs (Alendronate sodium) .Marland Kitchen.. 1 every week 8)  Liquid Minerals  .... 2 daily 9)  B 12  .... Once daily 10)  Fluticasone Propionate 50 Mcg/act Susp (Fluticasone propionate) .Marland Kitchen.. 1 spray each nostril daily as needed 11)  Fish Oil Oil (Fish oil) .... Daily 12)  Azithromycin 250 Mg Tabs (Azithromycin) .... 2 by  mouth today and then 1 daily for 4 days  Patient Instructions: 1)  Zpack. 2)  Recommended voice rest for laryngitis, Warm honey tea.  Drink lots of fluids.  Treat sympotmatically  with guafenesin,  nasal saline irrigation, and Tylenol.  Cough suppressant at night.  Prescriptions: AZITHROMYCIN 250 MG  TABS (AZITHROMYCIN) 2 by  mouth today and then 1 daily for 4 days  #6 x 0   Entered and Authorized by:   Ruthe Mannan MD   Signed by:   Ruthe Mannan MD on 05/20/2009   Method used:   [facility]        [facility]  [address] [phone]* (retail)       [address]       [place], [place]  [id number]       Ph: [phone] or [phone]       Fax: [phone]   RxID:   [id number]   Prior Medications: VITAMIN E 400 UNIT  CAPS (VITAMIN E) Take 1 tablet by mouth once a day CALTRATE 600 1500 MG  TABS (CALCIUM CARBONATE) Take 1 tablet by mouth two times a day GLUCOSAMINE-CHONDROITIN   CAPS (GLUCOSAMINE-CHONDROIT-VIT C-MN) Take 1 tablet by mouth two times a day MULTIVITAMINS   TABS (MULTIPLE VITAMIN) Take 1 tablet by mouth once a day ASPIRIN 81 MG  TABS (ASPIRIN) Take 1 tablet by mouth every other day PRILOSEC 20 MG  CPDR (OMEPRAZOLE) once daily FOSAMAX 70 MG  TABS (ALENDRONATE SODIUM) 1 every week LIQUID MINERALS () 2 daily B 12 () once daily FLUTICASONE PROPIONATE 50 MCG/ACT  SUSP (FLUTICASONE PROPIONATE) 1 spray each nostril daily as needed FISH OIL   OIL (FISH OIL) daily Current Allergies (reviewed today): ! CODEINE

## 2010-06-12 NOTE — Assessment & Plan Note (Signed)
Summary: per pt physical--insurance will pay b 4 labs--stc   Vital Signs:  Patient profile:   70 year old female Height:      62 inches Weight:      114 pounds BMI:     20.93 O2 Sat:      97 % on Room air Temp:     97.8 degrees F oral Pulse rate:   75 / minute BP sitting:   112 / 62  (left arm) Cuff size:   regular  Vitals Entered By: Bill Salinas CMA (December 11, 2009 1:19 PM)  O2 Flow:  Room air CC: CPX/ ab  Vision Screening:      Vision Comments: Last eye exam was with Dr Nile Riggs in Oct 2010 with Normal result   Primary Care Provider:  Jacques Navy MD  CC:  CPX/ ab.  History of Present Illness: Patient presents for routine medical exam. She feels good but a little tired: she has twin grandsons 16 months and a 4 month grand-daughter. She has no specific complaints.   C/o shin pain at night. She also has pain at the ischial tuberosity bilaterally. Not severe and usually self-limited.  she has a soft poorly formed stool and then will have water stool. Had colonoscopy in October '10 - diverticulitis and diminuitive.   she c/o dysparunia. She does admit to vaginal dryness. No dysuria.    Current Medications (verified): 1)  Vitamin E 400 Unit  Caps (Vitamin E) .... Take 1 Tablet By Mouth Once A Day 2)  Caltrate 600 1500 Mg  Tabs (Calcium Carbonate) .... Take 1 Tablet By Mouth Two Times A Day 3)  Glucosamine-Chondroitin   Caps (Glucosamine-Chondroit-Vit C-Mn) .... Take 1 Tablet By Mouth Two Times A Day 4)  Multivitamins   Tabs (Multiple Vitamin) .... Take 1 Tablet By Mouth Once A Day 5)  Aspirin 81 Mg  Tabs (Aspirin) .... Take 1 Tablet By Mouth Every Other Day 6)  Prilosec 20 Mg  Cpdr (Omeprazole) .... Once Daily 7)  Fosamax 70 Mg  Tabs (Alendronate Sodium) .Marland Kitchen.. 1 Every Week 8)  Liquid Minerals .... 2 Daily 9)  B 12 .... Once Daily 10)  Fluticasone Propionate 50 Mcg/act  Susp (Fluticasone Propionate) .Marland Kitchen.. 1 Spray Each Nostril Daily As Needed 11)  Fish Oil   Oil (Fish  Oil) .... Daily 12)  Promethazine Vc Plain 6.25-5 Mg/89ml Syrp (Promethazine-Phenylephrine) .Marland Kitchen.. 1 Tsp By Mouth Every 4 Hours As Needed For Cough, Esp At Night 13)  Vitamin D 400 Unit Caps (Cholecalciferol) .Marland Kitchen.. 1 Cap Once Daily  Allergies (verified): 1)  ! Codeine  Past History:  Past Medical History: Last updated: 07/26/2009 Allergic rhinitis Diverticulitis, hx of GERD atrophic vaginitis      Past Surgical History: Last updated: 12/29/2006 Appendectomy Hysterectomy Tonsillectomy Breast Bx Septoplasty C3-4,C4-5,C5-6,C6-7 diskectomy with fusion  Family History: Last updated: 01-04-08 father -deceased @74 : CAD/MI after years of angina mother - deceased @93 : CAD,  MAunt- breast cancer, colon cancer, ovarian cancer Brother - DM Sister - HTN, fibromyalgia, hyperthyroid disease brother - CAD/MI; another brother with CAD/PCI-stents  Social History: Last updated: 12/08/2008 HSG married '59 (44 y/o) 2 son- ''53, '55 (died at age 8); 1 daughter-'80; 4 grandchildren (1 set of twins) work: Engineer, manufacturing, Production designer, theatre/television/film of [employer], 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  The patient denies anorexia, fever, weight loss, weight gain, vision loss, decreased hearing, hoarseness, syncope, dyspnea on exertion, peripheral edema, prolonged cough, abdominal pain, severe indigestion/heartburn, incontinence, muscle weakness, suspicious skin lesions, difficulty walking, unusual weight change, abnormal bleeding, enlarged lymph nodes, and angioedema.         right chest pain, associated with meals, no exericse related pain.   Physical Exam  General:  WNWD white female who looks younger than her stated age Head:  Normocephalic and atraumatic without obvious abnormalities. No apparent alopecia or  balding. Eyes:  vision grossly intact, pupils equal, pupils round, corneas and lenses clear, and no injection.   Ears:  External ear exam shows no significant lesions or deformities.  Otoscopic examination reveals clear canals, tympanic membranes are intact bilaterally without bulging, retraction, inflammation or discharge. Hearing is grossly normal bilaterally. Nose:  no external deformity and no external erythema.   Mouth:  Oral mucosa and oropharynx without lesions or exudates.  Teeth in good repair. Neck:  supple, full ROM, no thyromegaly, no JVD, and no carotid bruits.   Chest Wall:  No deformities, masses, or tenderness noted. Breasts:  No mass, nodules, thickening, tenderness, bulging, retraction, inflamation, nipple discharge or skin changes noted.   Lungs:  Normal respiratory effort, chest expands symmetrically. Lungs are clear to auscultation, no crackles or wheezes. Heart:  Normal rate and regular rhythm. S1 and S2 normal without gallop, murmur, click, rub or other extra sounds. Abdomen:  soft, non-tender, normal bowel sounds, no masses, no guarding, and no splenomegaly.   Genitalia:  deferred Msk:  normal ROM, no joint tenderness, no joint swelling, no joint warmth, no redness over joints, and no joint instability.   Pulses:  2+ radial and DP pulses Extremities:  No clubbing, cyanosis, edema, or deformity noted with normal full range of motion of all joints.   Neurologic:  alert & oriented X3, cranial nerves II-XII intact, strength normal in all extremities, gait normal, and DTRs symmetrical and normal.   Skin:  turgor normal, color normal, no rashes, no suspicious lesions, and no ulcerations.   Cervical Nodes:  no anterior cervical adenopathy and no posterior cervical adenopathy.   Axillary Nodes:  no R axillary adenopathy and no L axillary adenopathy.   Psych:  Oriented X3, memory intact for recent and remote, normally interactive, good eye contact, and not anxious appearing.      Impression & Recommendations:  Problem # 1:  GERD (ICD-530.81) Well controlled on present medication  Her updated medication list for this problem includes:    Prilosec 20 Mg Cpdr (Omeprazole) ..... Once daily  Problem # 2:  VAGINITIS, ATROPHIC, SYMPTOMATIC (ICD-627.3) Probable cause of vaginal dryness and dysparunia  Plan - Premairn vaginal cream. Pt advised that it may take 6-8 weeks to see real improvement.   Her updated medication list for this problem includes:    Premarin 0.625 Mg/gm Crea (Estrogens, conjugated) .Marland Kitchen... 1 g vag applicator daily x 7 days then 2 times a week.  Problem # 3:  LEG PAIN (ICD-729.5) Patient with pain at the shin right leg that is intermittent. Exam is negative for radiculopathy. Also with pain at the ischial tuborosities. Probable bursitis.  Plan - NSAID of choice.  Problem # 4:  Preventive Health Care (ICD-V70.0) Unremarkable history. Normal physical exam. Last colonoscopy October '10, current with mammography. All immunizations are up-to date.  Patient is independent in all ADLs, she is well adjusted without signs of depression. She has no increased  fall risk or injury risk.  In summary - a very nice woman who is medically stable and doing well. She will return as needed or in 1 year.   Complete Medication List: 1)  Vitamin E 400 Unit Caps (Vitamin e) .... Take 1 tablet by mouth once a day 2)  Caltrate 600 1500 Mg Tabs (Calcium carbonate) .... Take 1 tablet by mouth two times a day 3)  Glucosamine-chondroitin Caps (Glucosamine-chondroit-vit c-mn) .... Take 1 tablet by mouth two times a day 4)  Multivitamins Tabs (Multiple vitamin) .... Take 1 tablet by mouth once a day 5)  Aspirin 81 Mg Tabs (Aspirin) .... Take 1 tablet by mouth every other day 6)  Prilosec 20 Mg Cpdr (Omeprazole) .... Once daily 7)  Liquid Minerals  .... 2 daily 8)  B 12  .... Once daily 9)  Fluticasone Propionate 50 Mcg/act Susp (Fluticasone propionate) .Marland Kitchen.. 1 spray each  nostril daily as needed 10)  Fish Oil Oil (Fish oil) .... Daily 11)  Promethazine Vc Plain 6.25-5 Mg/10ml Syrp (Promethazine-phenylephrine) .Marland Kitchen.. 1 tsp by mouth every 4 hours as needed for cough, esp at night 12)  Vitamin D 400 Unit Caps (Cholecalciferol) .Marland Kitchen.. 1 cap once daily 13)  Premarin 0.625 Mg/gm Crea (Estrogens, conjugated) .Marland Kitchen.. 1 g vag applicator daily x 7 days then 2 times a week.  Other Orders: Subsequent annual wellness visit with prevention plan (W1191)  Patient: Karla Roman Note: All result statuses are Final unless otherwise noted.  Tests: (1) Lipid Panel (LIPID)   Cholesterol          [H]  250 mg/dL                   4-782     ATP III Classification            Desirable:  < 200 mg/dL                    Borderline High:  200 - 239 mg/dL               High:  > = 240 mg/dL   Triglycerides             49.0 mg/dL                  9.5-621.3     Normal:  <150 mg/dL     Borderline High:  086 - 199 mg/dL   HDL                       578.46 mg/dL                >96.29   VLDL Cholesterol          9.8 mg/dL                   5.2-84.1  CHO/HDL Ratio:  CHD Risk                             2                    Men          Women     1/2 Average Risk     3.4          3.3     Average Risk          5.0  4.4     2X Average Risk          9.6          7.1     3X Average Risk          15.0          11.0                           Tests: (2) BMP (METABOL)   Sodium                    137 mEq/L                   135-145   Potassium                 4.6 mEq/L                   3.5-5.1   Chloride                  103 mEq/L                   96-112   Carbon Dioxide            28 mEq/L                    19-32   Glucose              [H]  100 mg/dL                   16-10   BUN                       18 mg/dL                    9-60   Creatinine                0.7 mg/dL                   4.5-4.0   Calcium                   9.4 mg/dL                   9.8-11.9   GFR                        87.79 mL/min                >60  Tests: (3) CBC Platelet w/Diff (CBCD)   White Cell Count          4.9 K/uL                    4.5-10.5   Red Cell Count            4.01 Mil/uL                 3.87-5.11   Hemoglobin                12.9 g/dL                   14.7-82.9   Hematocrit                37.8 %  36.0-46.0   MCV                       94.3 fl                     78.0-100.0   MCHC                      34.2 g/dL                   16.1-09.6   RDW                       13.4 %                      11.5-14.6   Platelet Count            205.0 K/uL                  150.0-400.0   Neutrophil %              63.6 %                      43.0-77.0   Lymphocyte %              24.5 %                      12.0-46.0   Monocyte %                8.8 %                       3.0-12.0   Eosinophils%              2.7 %                       0.0-5.0   Basophils %               0.4 %                       0.0-3.0   Neutrophill Absolute      3.1 K/uL                    1.4-7.7   Lymphocyte Absolute       1.2 K/uL                    0.7-4.0   Monocyte Absolute         0.4 K/uL                    0.1-1.0  Eosinophils, Absolute                             0.1 K/uL                    0.0-0.7   Basophils Absolute        0.0 K/uL                    0.0-0.1  Tests: (4) Hepatic/Liver Function Panel (HEPATIC)   Total Bilirubin           0.9 mg/dL  0.3-1.2   Direct Bilirubin          0.1 mg/dL                   0.4-5.4   Alkaline Phosphatase      68 U/L                      39-117   AST                       26 U/L                      0-37   ALT                       19 U/L                      0-35   Total Protein             6.9 g/dL                    0.9-8.1   Albumin                   4.3 g/dL                    1.9-1.4  Tests: (5) TSH (TSH)   FastTSH                   0.79 uIU/mL                 0.35-5.50  Tests: (6) UDip Only (UDIP)   Color                     LT.  YELLOW       RANGE:  Yellow;Lt. Yellow   Clarity                   CLEAR                       Clear   Specific Gravity          >=1.030                     1.000 - 1.030   Urine Ph                  5.5                         5.0-8.0   Protein                   NEGATIVE                    Negative   Urine Glucose             NEGATIVE                    Negative   Ketones                   NEGATIVE                    Negative   Urine Bilirubin           NEGATIVE  Negative   Blood                     NEGATIVE                    Negative   Urobilinogen              0.2                         0.0 - 1.0   Leukocyte Esterace        NEGATIVE                    Negative   Nitrite                   NEGATIVE                    Negative  Tests: (7) Cholesterol LDL - Direct (DIRLDL)  Cholesterol LDL - Direct                             123.3 mg/dL     Optimal:  <161 mg/dL     Near or Above Optimal:  100-129 mg/dL     Borderline High:  096-045 mg/dL     High:  409-811 mg/dL     Very High:  >914 mg/dL Prescriptions: PRILOSEC 20 MG  CPDR (OMEPRAZOLE) once daily  #90 x 3   Entered by:   Ami Bullins CMA   Authorized by:   Jacques Navy MD   Signed by:   Bill Salinas CMA on 12/11/2009   Method used:   Electronically to        [facility]* (mail-order)       [address]       [place], [place]  [address]       Ph: [phone]       Fax: [phone]   RxID:   [id number] PREMARIN 0.625 MG/GM CREA (ESTROGENS, CONJUGATED) 1 g vag applicator daily x 7 days then 2 times a week.  #42.5g x 2   Entered and Authorized by:   Jacques Navy MD   Signed by:   Bill Salinas CMA on 12/11/2009   Method used:   Electronically to        [facility] [phone]* (retail)       [address]       [place], [place]  [id number]       Ph: [phone] or [phone]       Fax: [phone]   RxID:   [id number]    Preventive Care  Screening  Mammogram:    Date:  06/22/2009    Results:  normal

## 2010-06-12 NOTE — Assessment & Plan Note (Signed)
Summary: EAR FEEL STUFF-PAIN--STC   Vital Signs:  Patient profile:   70 year old female Height:      62 inches Weight:      114 pounds BMI:     20.93 O2 Sat:      97 % on Room air Temp:     96.8 degrees F oral Pulse rate:   88 / minute BP sitting:   122 / 68  (left arm) Cuff size:   regular  Vitals Entered By: Bill Salinas CMA (May 30, 2009 9:19 AM)  O2 Flow:  Room air CC: pt here with complaint of ears feeling stopped up and aching mostly in her right ear x about two weeks. pt states she just recently finished a zpak for brochitis/ ab   Primary Care Provider:  Jacques Navy MD  CC:  pt here with complaint of ears feeling stopped up and aching mostly in her right ear x about two weeks. pt states she just recently finished a zpak for brochitis/ ab.  History of Present Illness: A two week h/o of ear ache rigth. She was seen in Saturday clinic, Jan 8th by Dr. Dayton Martes  and was treated for bronchitis with z-pak and symptomatic care. She has continued to have ear pain on the right. No drainage from the ear. It feels full and will ache. No fever. Finished Z-pak.   Current Medications (verified): 1)  Vitamin E 400 Unit  Caps (Vitamin E) .... Take 1 Tablet By Mouth Once A Day 2)  Caltrate 600 1500 Mg  Tabs (Calcium Carbonate) .... Take 1 Tablet By Mouth Two Times A Day 3)  Glucosamine-Chondroitin   Caps (Glucosamine-Chondroit-Vit C-Mn) .... Take 1 Tablet By Mouth Two Times A Day 4)  Multivitamins   Tabs (Multiple Vitamin) .... Take 1 Tablet By Mouth Once A Day 5)  Aspirin 81 Mg  Tabs (Aspirin) .... Take 1 Tablet By Mouth Every Other Day 6)  Prilosec 20 Mg  Cpdr (Omeprazole) .... Once Daily 7)  Fosamax 70 Mg  Tabs (Alendronate Sodium) .Marland Kitchen.. 1 Every Week 8)  Liquid Minerals .... 2 Daily 9)  B 12 .... Once Daily 10)  Fluticasone Propionate 50 Mcg/act  Susp (Fluticasone Propionate) .Marland Kitchen.. 1 Spray Each Nostril Daily As Needed 11)  Fish Oil   Oil (Fish Oil) .... Daily  Allergies  (verified): 1)  ! Codeine PMH-FH-SH reviewed-no changes except otherwise noted  Review of Systems  The patient denies anorexia, fever, weight loss, weight gain, decreased hearing, chest pain, syncope, dyspnea on exertion, prolonged cough, hemoptysis, hematochezia, genital sores, suspicious skin lesions, depression, and enlarged lymph nodes.    Physical Exam  General:  Well-developed,well-nourished,in no acute distress; alert,appropriate and cooperative throughout examination Head:  Normocephalic and atraumatic without obvious abnormalities. No apparent alopecia or balding. Ears:  EACs with minimal cerumen. TMs appear normal Lungs:  normal respiratory effort, normal breath sounds, and no dullness.   Heart:  normal rate and regular rhythm.     Impression & Recommendations:  Problem # 1:  EUSTACHIAN TUBE DYSFUNCTION, BILATERAL (ICD-381.81) No evidence of infection. Suspect eustachian tube dysfunction  Plan - sudafed 30mg  two times a day or three times a day.            if no improvement will need to see ENT  Complete Medication List: 1)  Vitamin E 400 Unit Caps (Vitamin e) .... Take 1 tablet by mouth once a day 2)  Caltrate 600 1500 Mg Tabs (Calcium carbonate) .... Take 1  tablet by mouth two times a day 3)  Glucosamine-chondroitin Caps (Glucosamine-chondroit-vit c-mn) .... Take 1 tablet by mouth two times a day 4)  Multivitamins Tabs (Multiple vitamin) .... Take 1 tablet by mouth once a day 5)  Aspirin 81 Mg Tabs (Aspirin) .... Take 1 tablet by mouth every other day 6)  Prilosec 20 Mg Cpdr (Omeprazole) .... Once daily 7)  Fosamax 70 Mg Tabs (Alendronate sodium) .Marland Kitchen.. 1 every week 8)  Liquid Minerals  .... 2 daily 9)  B 12  .... Once daily 10)  Fluticasone Propionate 50 Mcg/act Susp (Fluticasone propionate) .Marland Kitchen.. 1 spray each nostril daily as needed 11)  Fish Oil Oil (Fish oil) .... Daily

## 2010-06-12 NOTE — Progress Notes (Signed)
    Preventive Care Screening  Mammogram:    Date:  06/22/2009    Results:  normal

## 2010-07-10 ENCOUNTER — Encounter: Payer: Self-pay | Admitting: Internal Medicine

## 2010-08-06 ENCOUNTER — Ambulatory Visit: Payer: Self-pay | Admitting: Internal Medicine

## 2010-09-25 NOTE — Assessment & Plan Note (Signed)
[facility]                           PRIMARY CARE OFFICE NOTE   Karla, Roman                   MRN:          [id number]  DATE:10/09/2006                            DOB:          05/27/1940    Karla Roman is a 70 year old women last seen in the office Sep 16, 2006  after being treated for pneumonia with good resolution of symptoms.  Also treated for eustachian tube dysfunction with pseudoephedrine with  improvement.  She reports she is actually feeling well and doing well at  this time.   Past medical history, family history and social history is well  documented  in my note of Sep 23, 2005 with no significant changes  except for recent hospitalization for pneumonia.   CURRENT MEDICATIONS:  1. Vitamin E.  2. Caltrate.  3. Glucosamine.  4. Biotin.  5. Multivitamin.  6. Fish oil.  7. Prilosec 20 mg q. a.m.  8. Fosamax 70 mg weekly.  9. Aspirin 81 mg daily.  10.B12 orally daily.  11.Celebrex 200 mg daily p.r.n.  12.Vagifem tablets twice a week.   REVIEW OF SYSTEMS:  The patient has had no fevers, sweats or chills and  doing well.  Her last eye exam was in 2007 and she is doing well.  The  patient has had some caps and a root canal done this year.  No  cardiovascular, respiratory, GI problems.  She does have some periodic  vaginal discharge, which will be just for a day or less, no pain or  discomfort.  She does have atropic vaginitis, but uses Vagifem with good  results.  No musculoskeletal, dermatological, neurologic complaints.   EXAMINATION:  Temperature 98.1, blood pressure 116/65, pulse 74, weight  105.  GENERAL APPEARANCE:  This is a well-nourished slender women in no acute  distress.  HEENT:  Normocephalic, atraumatic.  EACs and TMs were unremarkable.  Oropharynx with native dentition that is well reserved with no buccal or  palatal lesions. Posterior pharynx is clear.  Conjunctivae and sclerae  was clear, PERRLA, EOMI,  funduscopic exam was unremarkable.  NECK:  Supple without thyromegaly.  NODES:  No adenopathy was noted in the cervical or supraclavicular  regions.  CHEST:  No CVA tenderness.  LUNGS:  Clear to auscultation and percussion.  BREAST:  Deferred to gynecology.  CARDIOVASCULAR:  2+ radial pulses, no JVD or carotid bruits.  She had a  quiet pericardium with a regular rate and rhythm without murmurs, rubs  or gallops.  ABDOMEN:  Soft, no guarding, no rebound, no organosplenomegaly was  noted.  PELVIC AND RECTAL:  Deferred to gynecology.  EXTREMITIES:  Normal.   LABORATORY:  Hemoglobin 14 grams, white count 5500 with a normal  differential.  Chemistries were unremarkable with a serum glucose of  101.  Renal function was normal with a creatinine of 0.7. Cholesterol is  246, triglycerides 46, HDL was 92, LDL was 115.   ASSESSMENT/PLAN:  1. Pneumonia resolved.  2. Gastroesophageal reflux disease, stable on Prilosec and she will      continue the same.  3. She asked about B12 deficiency.  I reported to her that her MCV was      normal and it is unlikely that this would be the case.  4. Allergic rhinitis, stable.  5. Hip and back pain.  The patient has seen Dr. Newell Coral for this.  At      this point she does seem stable; however if she has progressive      pain or discomfort as she may need further evaluation.  6. Health maintenance.  The patient is current and up to date with Dr.      Lloyd Huger for gynecology.  She reports she is current with mammography.      Last colonoscopy was February 02, 2002 and she would be due for      followup in 2010.   In summary, this is a pleasant women who is medically stable at this  time.  She will continue on her present medical regimen and she does  have current prescriptions.  She has asked to return to see me on a  p.r.n. basis.     Rosalyn Gess Norins, MD  Electronically Signed    MEN/MedQ  DD: 10/10/2006  DT: 10/10/2006  Job #: [id number]   cc:    Alberteen Sam

## 2010-09-28 NOTE — Op Note (Signed)
NAME:  Karla Roman, Karla Roman            ACCOUNT NO.:  [id number]   MEDICAL RECORD NO.:  [id number]          PATIENT TYPE:  AMB   LOCATION:  DSC                          FACILITY:  [facility]   PHYSICIAN:  Katy Fitch. Sypher, M.D. DATE OF BIRTH:  Feb 03, 1941   DATE OF PROCEDURE:  04/18/2006  DATE OF DISCHARGE:                               OPERATIVE REPORT   PREOPERATIVE DIAGNOSES:  1. Dupuytren's contracture involving pretendinous fibers to right long      finger and right ring finger.  2. Chronic stenosing tenosynovitis, right long finger.  3. Chronic stenosing tenosynovitis, right ring finger.   POSTOPERATIVE DIAGNOSES:  1. Dupuytren's contracture involving pretendinous fibers to right long      finger and right ring finger.  2. Chronic stenosing tenosynovitis, right long finger.  3. Chronic stenosing tenosynovitis, right ring finger.   OPERATION:  1. Resection of palmar fascia, right long finger.  2. Resection of palmar fascia, right ring finger.  3. Release of the right long finger A1 pulley.  4. Release of right ring finger A1 pulley.   OPERATING SURGEON:  Katy Fitch. Sypher, M.D.   ASSISTANT:  Molly Maduro Dasnoit PA-C.   ANESTHESIA:  IV regional at proximal brachial level, supervising  anesthesiologist is Dr. Sampson Goon.   INDICATIONS:  Karla Roman is a 70 year old woman referred by  Rosalyn Gess. Norins, MD, for evaluation and management of right hand  issues.  She is a well-known patient who was treated in the past for a  severe frozen shoulder.   Clinical examination revealed early Dupuytren's disease affecting her  right long and ring finger pretendinous fibers in the palm.  She has  extensive nodular disease and early cord formation.  In addition, she  had locking stenosing tenosynovitis of her long and ring fingers.   Karla Roman requested resection of her pathologic fascia and remedy of  her A1 pulley triggering.   After informed consent, she is brought to the operating  room at this  time.   Preoperatively she was carefully advised that Dupuytren's disease is a  genetic predicament that cannot be cured by surgery.   Our efforts to remove the pathologic fascia are palliative.  She  understands that over time she will develop more pathologic fascia with  nodules, cords and probable contracture.  Nonetheless, we can resolve  her current contracture problem by resection of her pathologic fascia at  this time.  In addition, she has triggering of right long and ring  fingers.  She requested that we remedy this as well.   After informed consent, she is brought to the operating room at this  time.  Preoperatively questions were invited and answered regarding the  potential risks and benefits of surgery.   PROCEDURE:  Karla Roman is brought to the operating room and placed  in supine position on the operating table.   Following anesthesia consult with Dr. Sampson Goon, IV regional block was  selected due to a history of obstructive sleep apnea.   After the IV regional block was placed within 10 minutes, excellent  anesthesia of the right hand was achieved.  The arm  was then prepped  with Betadine soap and solution and sterilely draped.   The procedure commenced with planning of Brunner zigzag incisions to  expose the pretendinous fibers of the right long and ring fingers.  Separate incisions were fashioned.   The long finger fascia was approached through a transverse incision in  the distal palmar crease.  The fascia extending from the distal  metacarpal proximally to the carpal canal was identified, released on  the deep surface of the dermis, and the nodular disease resected.  The  A1 pulley of the long finger was then identified and released along its  radial border.  Hemostasis was achieved with bipolar cautery.   A three-limb Brunner zigzag incision was then fashioned in the line of  the ring finger.   An extensive fasciectomy was performed  from the level of the metacarpal  head proximally to the carpal canal.  There was extensive nodular  disease and involvement of the septa.   The A1 pulley was isolated and subsequently split along its radial  border with scissors.  Thereafter, free range of motion of the long and  ring fingers passively was noted.   The wound was inspected for bleeding points.  After assuring that the  pathologic fascia nodules were resected, the wounds were repaired with  mattress sutures of 5-0 nylon.   Lidocaine 2% was infiltrated for postoperative analgesia.  There were no  apparent complications.   Ms. Landrigan was placed in a compressive dressing and awakened from her  sedation and transferred to the recovery room with stable vital signs.  She will be discharged home to the care her family with a prescription  for Dilaudid 2 mg one p.o. q.4-6h. p.r.n. pain, 20 tablets without  refill.   She will also use over-the-counter analgesics as needed for pain  including, Tylenol or Advil.   We will see her back in follow-up in 1 week.   Note that she was given 1 g of Ancef as an IV prophylactic antibiotic  due to a history of mitral valve prolapse even though we realize that  the [employer] states that prophylactic antibiotics  are optional for clean orthopedic surgery in this setting.      Katy Fitch Sypher, M.D.  Electronically Signed     RVS/MEDQ  D:  04/18/2006  T:  04/18/2006  Job:  [id number]   cc:   Rosalyn Gess. Norins, MD

## 2010-09-28 NOTE — Op Note (Signed)
NAME:  Karla Roman, [name]:  [id number]   MEDICAL RECORD NO.:  [id number]                   PATIENT TYPE:  INP   LOCATION:  2891                                 FACILITY:  [facility]   PHYSICIAN:  Hewitt Shorts, M.D.            DATE OF BIRTH:  June 09, 1940   DATE OF PROCEDURE:  01/19/2004  DATE OF DISCHARGE:                                 OPERATIVE REPORT   PREOPERATIVE DIAGNOSIS:  Cervical spondylosis, cervical degenerative disk  disease, cervical instability and cervical radiculopathy.   POSTOPERATIVE DIAGNOSIS:  Cervical spondylosis, cervical degenerative disk  disease, cervical instability and cervical radiculopathy.   PROCEDURE:  C3-4, C5-6 and C6-7 anterior cervical diskectomy with  arthrodesis with _________ allograft and tethered cervical plating.   SURGEON:  Hewitt Shorts, M.D.   ASSISTANT:  Payton Doughty, M.D.   ANESTHESIA:  General endotracheal   INDICATIONS:  The patient is a 70 year old woman, who had presented with  neck pain and left cervical radiculopathy, who was found to have significant  weakness in the left lower extremity and is found to have advanced  spondylosis and degenerative disk disease at the C5-6 and C6-7 levels with  degenerative dynamic instability at the C4-5 level.  The decision was made  to proceed with the three level anterior cervical diskectomy with  arthrodesis.   DESCRIPTION OF PROCEDURE:  The patient was brought to the operating room,  and placed under general endotracheal anesthesia.  Prior to the  neurosurgical procedure, Dr. Teressa Senter performed a manipulation under  anesthesia for adhesive capsulitis of the left shoulder.  After that  procedure was completed, the patient was placed on 10 pounds of Holter  cervical traction and then the neck was prepped with Betadine soap and  solution and draped in a sterile fashion.  A left vertical oblique incision  was made paralleling the anterior border of the  left sternocleidomastoid.  The line of the incision was infiltrated with local anesthetic with  epinephrine and an incision was made and carried down through the  subcutaneous tissue.  Bipolar cautery and electrocautery to maintain  hemostasis.  Dissection was carried down through the platysmal and then,  dissection was carried down through the avascular plane to the  sternocleidomastoid with the carotid and jugular vein laterally and the  trachea and esophagus medially. The ventral aspect of the vertebral column  was identified and an x-ray taken of the C4-5, C5-6 and C6-7 intervertebral  disk space was identified.  At each level, there was significant ventral  spondylitic degeneration.  The remaining annulus was incised and the ventral  osteophytes were removed, using osteophyte removal tool as well as the X-Max  drill and then, self-retaining retractors were placed and we proceeded with  the diskectomy using a variety of micro-curettes and pituitary rongeurs.  The microscope was draped and brought into the field to provide additional  magnification and illumination for visualization and the  remainder of the  procedure was performed using micro-dissection by microsurgical technique.  The cartilaginous end plates of the corresponding vertebral bodies were  removed using the X-Max drill along with micro-curettes and then posterior  osteophytic overgrowth was removed using the X-Max drill along with a  2 mm Kerrison punch with a foot plate.  There was significant spondylitic  overgrowth at every level and this was carefully removed and we were able to  decompress the spinal canal and neural foramina at each level. Once the  decompression was completed, hemostasis was established with the use of  Gelfoam soaked in Thrombin and then, using bone space sizers, we were able  to select two 6 mm grafts and one 7 mm graft of tricortical __________  allograft.  Each was hydrated in saline solution.   The two 6 mm grafts were  positioned at the C5-6 and C6-7 levels, and the single 7 mm graft was placed  at the C4-5 level.  We then selected a 46 mm three level tether cervical  plate.  This was positioned over the fusion construct and secured to the C4  and C7 vertebrae with a pair of 4 by 13 mm screws into the C5 and C6  vertebrae with a single 4 by 2 mm screw being placed at each level.  At each  level, the screw hole was drilled and tapped and the screw placed.  The  screws were placed in alternating fashion and an x-ray subsequently showed  the grafts, plate and screws to be in good position, the overall alignment  to be good.  Final tightening was done of all six screws and then, the wound  was irrigated with Bacitracin solution.  ___________ was confirmed and we  proceeded with closure of the platysma.  It was closed with interrupted,  inverted 2-0 undyed Vicryl sutures, subcutaneous and subcuticular layers  closed with interrupted, inverted 3-0 undyed Vicryl sutures and skin edges  were approximated with Dermabond.   The patient tolerated the procedure well.  The estimated blood loss was 100  cc.  Sponge and needle counts were correct.  Following surgery, the patient  was to be placed in a Aspen cervical collar and the cervical traction had  already been discontinued once the plating had been completed and patient is  to be reversed from anesthetic, extubated and transferred to the recovery  room for further care.                                               Hewitt Shorts, M.D.    RWN/MEDQ  D:  01/19/2004  T:  01/19/2004  Job:  [id number]

## 2010-09-28 NOTE — Op Note (Signed)
[facility] [place]  Patient:    Karla Roman, Karla Roman                     MRN: [id number] Proc. Date: 04/25/00 Attending:  Freddy Finner, M.D.                           Operative Report  PREOPERATIVE DIAGNOSIS:       Persistent lower pelvic pressure and right lower quadrant and pelvic pain, which has persisted since postoperative course following total vaginal hysterectomy, anterior and posterior vaginal repair and sacrospinous ligament suspension done in May 2001.  POSTOPERATIVE DIAGNOSIS:      Extensive pelvic and lower abdominal adhesions possible remnant of right ovary.  OPERATIVE PROCEDURE:          Laparoscopy, extensive adhesiolysis, resection of white nodular structure on the right pelvic sidewall consistent with an ovarian remnant.  INTRAOPERATIVE COMPLICATIONS: None.  ESTIMATED INTRAOPERATIVE BLOOD LOSS:                   20 cc.  DETAILS OF PRESENT ILLNESS:  Patient is a 70 year old white, married female, who had extensive pelvic surgery in May 2001.  She has continued to have pain and had a fairly slow recovery after that surgery.  In the recent past, she has had a CT scan of the abdomen and pelvis, which showed a small, benign-appearing cyst on the right kidney, but no other abnormal findings. She also had a pelvic and abdominal ultrasound again confirming the cyst in the kidney, but no other abnormal findings.  Due to the persistence of her symptoms, we have elected to admit her now for laparoscopy.  INTRAOPERATIVE FINDINGS:      Revealed extensive pelvic and abdominal adhesions.  There were adhesions of the omentum to the right lower quadrant at the location of an appendectomy scar.  There were adhesions of the sigmoid colon to the pelvic and anterior bladder peritoneum in the pelvis and along the line of dissection for the right salpingo-oophorectomy done previously. There were adhesions of the sigmoid colon to the vaginal cuff.  There was  a white structure at the site of the infundibulopelvic ligament on the right, which was possibly consistent with a small residual piece of ovary.  All of these findings were recorded and still photographs retained in the office record.  DESCRIPTION OF PROCEDURE:     Patient was admitted on the morning of surgery, brought to the operating room, placed under adequate general anesthesia, placed in the dorsal lithotomy position using the [facility] stirrup system. Betadine prep of the abdomen was carried out.  Bladder was evacuated with a Robinson catheter.  Sterile drapes were applied.  Two small incisions were made, one at the umbilicus and one just above the symphysis.  A 10 mm trocar was introduced in the upper incision and direct inspection with the laparoscope revealed adequate placement with no evidence of injury on entry. Pneumoperitoneum was allowed to accumulate.  Patient was placed in Trendelenburg position.  A 5 mm trocar was placed in the lower incision and a blunt probe used in this incision during the procedure as well as later and atraumatic grasping forceps were used.  Using the operating channel of the laparoscope for bipolar coagulation and for placement of reticulating scissors, all visible evidence of adhesions were lysed and coagulated where appropriate to complete hemostasis.  Some bleeding was encountered at the resection of the adhesions  on the anterior bladder flap to the left of the midline, but this was completely controlled with bipolar coagulation.  Copious irrigation was carried out.  Irrigating solution was aspirated.  The remnant of ovary or structure consistent with this on the right side was elevated with grasping forceps.  A pedicle was developed using bipolar coagulation and sharp division of this piece of tissue was completely excised and submitted for histologic examination.  Again, hemostasis was adequate.  All of the irrigating solution was aspirated from  the abdomen.  Gas was allowed to escape.   The instruments were removed.  The skin incisions were closed with interrupted subcuticular sutures of 3-0 Dexon.  Steri-Strips were applied to the lower incision.  Marcaine 0.25% was injected into the incision sites for postoperative analgesia and the patient tolerated the procedure well and was taken to the recovery room in good condition. DD:  04/25/00 TD:  04/25/00 Job: [id number]

## 2010-09-28 NOTE — Op Note (Signed)
NAME:  [name], [name] NO.:  [id number]   MEDICAL RECORD NO.:  [id number]          PATIENT TYPE:  INP   LOCATION:  3013                         FACILITY:  [facility]   PHYSICIAN:  Katy Fitch. Sypher Montez Hageman., M.D.DATE OF BIRTH:  08-Feb-1941   DATE OF PROCEDURE:  01/19/2004  DATE OF DISCHARGE:  01/20/2004                                 OPERATIVE REPORT   Note that this is a procedure performed in conjunction with Hewitt Shorts, M.D., who performed an anterior cervical diskectomy and interbody  fusion during the same anesthetic.   PREOPERATIVE DIAGNOSIS:  Chronic adhesive capsulitis, left shoulder.   POSTOPERATIVE DIAGNOSIS:  Chronic adhesive capsulitis, left shoulder.   OPERATION PERFORMED:  Examination of left shoulder under anesthesia with  gentle closed manipulation of left shoulder to increased range of motion.   SURGEON:  Katy Fitch. Sypher, M.D.   ASSISTANT:  None.   ANESTHESIA:  General endotracheal.   SUPERVISING ANESTHESIOLOGIST:  See anesthesia sheet.   INDICATIONS FOR PROCEDURE:  Karla Roman is a well-known patient who has  been followed for adhesive capsulitis of the left shoulder during the spring  and summer of 2005.  During her evaluation, she was noted to have  significant neck and left arm pain and was referred for evaluation and  management by Dr. Newell Coral for possible anterior cervical diskectomy and  interbody fusion.  After consultation with Dr. Newell Coral, she has decided to  proceed with her cervical surgery at this time.  Due to her failure to  respond to a therapy program to improve the range of motion of her shoulder,  Dr. Newell Coral has invited me to perform and examination of her shoulder under  anesthesia and manipulation of her shoulder prior to proceeding with her  cervical surgery.   DESCRIPTION OF PROCEDURE:  [name] was brought to the operating  room and placed in supine position on the operating table.  Following  informed consent in the holding area, during which questions were invited  and answered.  She was transported to the operating room and placed in  supine position upon the operating table.  Following the induction of stable  general orotracheal anesthesia, she was carefully positioned on the  operating table anticipating anterior cervical fusion.  With the consent of  the anesthesiology staff, I proceeded to examine her left shoulder under  anesthesia.  She was noted to have elevation of her shoulder under general  anesthesia to approximately 160 degrees and external rotation to  approximately 75 degrees.  Internal rotation was severely limited at about  20 degrees.  After gentle manipulation, we were able to increase the range  of motion to 180 degrees of combined elevation, external rotation of 95  degrees  at 90 degrees abduction and internal rotation of 70 degrees at 90  degrees abduction.  A minimum amount of force was required.   The characteristic release of adhesions was both audible and palpable.  There was no swelling or sign of bony injury.  [name] arm was then  carefully positioned at her side and prepared in the usual manner by Dr.  Nudelman for anterior cervical surgery.  We have advised Dr. Newell Coral that  she will require routine analgesics that he would normally provide  postoperatively.   I anticipate seeing her for consultation in the office approximately one  week following surgery.  She is tentatively scheduled for re-examination on  January 23, 2004.       RVS/MEDQ  D:  02/07/2004  T:  02/07/2004  Job:  [id number]

## 2010-09-28 NOTE — Op Note (Signed)
   NAME:  Karla Roman, Karla Roman NO.:  [id number]   MEDICAL RECORD NO.:  [id number]                   PATIENT TYPE:  OIB   LOCATION:  2899                                 FACILITY:  [facility]   PHYSICIAN:  Lowell Bouton, M.D.      DATE OF BIRTH:  May 18, 1940   DATE OF PROCEDURE:  03/10/2003  DATE OF DISCHARGE:                                 OPERATIVE REPORT   PREOPERATIVE DIAGNOSIS:  Septic arthritis left index metacarpophalangeal  joint.   POSTOPERATIVE DIAGNOSIS:  Septic arthritis left index metacarpophalangeal  joint.   OPERATION PERFORMED:  Incision and drainage of left metacarpophalangeal  joint.   SURGEON:  Lowell Bouton, M.D.   ANESTHESIA:  General.   OPERATIVE FINDINGS:  The patient had a cat bite that extended down through  the capsule of the MP joint.  There was purulent material in the  subcutaneous tissues that was cultured.   DESCRIPTION OF PROCEDURE:  Under general anesthesia with a tourniquet on the  left arm, the left hand was prepped and draped in the usual fashion.  After  elevating the limb, the tourniquet was inflated to .  A longitudinal  incision was made on the dorsum of the index MP joint and carried down to  through the subcutaneous tissues.  Cultures were obtained as gross purulent  material was identified.  The incision was carried down through the extensor  mechanism and through the capsule of the MP joint.  The joint was irrigated  copiously with saline.  The wound was packed open with iodoform packing and  the edges were closed with 4-0 nylon suture.  Sterile dressings were applied  followed by dorsal splint.  The patient tolerated the procedure well and  went to the recovery room awake and stable in  good condition.                                                Lowell Bouton, M.D.    EMM/MEDQ  D:  03/10/2003  T:  03/11/2003  Job:  [id number]   cc:   Rosalyn Gess. Norins, M.D. [facility]

## 2010-09-28 NOTE — Procedures (Signed)
NAME:  Karla Roman, Karla Roman NO.:  [id number]   MEDICAL RECORD NO.:  [id number]          PATIENT TYPE:  OUT   LOCATION:  SLEEP CENTER                 FACILITY:  [facility]   PHYSICIAN:  Marcelyn Bruins, M.D. [facility] DATE OF BIRTH:  March 30, 1941   DATE OF STUDY:  10/17/2004                              NOCTURNAL POLYSOMNOGRAM   REFERRING PHYSICIAN:  Rosalyn Gess. Norins, M.D.   INDICATIONS:  Hypersomnia with sleep apnea.   EPWORTH SCORE:  Epworth score 9.   SLEEP ARCHITECTURE:  The patient total sleep time of 327 minutes see with  decreased REM and slow wave sleep. Sleep onset latency was prolonged as was  REM onset. Sleep efficiency was only 72%.   IMPRESSION:  1.  Mild to moderate obstructive sleep apnea/hypopnea syndrome with a      respiratory disturbance index of 16 events per hour and O2 desaturation      as low as 83%. Events were clearly worse in the supine position.      Treatment of this degree of sleep apnea may include weight loss, oral      surgery, oral appliance, and C-PAP. Clinical correlation is suggested.  2.  Very fragmented sleep with significantly decreased REM and slow wave      sleep.  3.  Mild to moderate snoring noted throughout the study.  4.  No clinically significant cardiac arrhythmias.     ______________________________  Suzzette Righter    KC/MEDQ  D:  10/26/2004 16:16:11  T:  10/27/2004 07:29:34  Job:  [id number]

## 2010-09-28 NOTE — Discharge Summary (Signed)
[facility]  Patient:    Karla Roman, Karla Roman                   MRN: [id number] Adm. Date:  [id number] Disc. Date: [id number] Attending:  Minette Headland                           Discharge Summary  DISCHARGE DIAGNOSES:          1. Uterine enlargement secondary to leiomyomata.                               2. Adenomyosis.                               3. Dense pelvic adhesions.                               4. Cystocele with third degree prolapse.                               5. Rectocele with third degree prolapse.                               6. Vaginal vault prolapse.  PROCEDURE:                    Total abdominal hysterectomy, bilateral salpingo-oophorectomy, anterior and posterior vaginal repair, sacrospinous ligament suspension of vagina.  Postoperative complications, low grade temperature of 100.1 and slow return of bowel and bladder function.  Other complications were none.  DISPOSITION:                  The patient was in satisfactory improved condition at the time of her discharge.  She was given Percocet and Phenergan to be taken for pain and nausea.  She was to continue with Premarin 0.9 mg per day.  She was also to take Colace and Metamucil.  She is to see me in two weeks or with her ability to void 200 or more cc and a residual of 50 cc.  She is to have progressively increasing physical activity, but no vaginal entry and no vaginal lifting.  She is to take a regular diet.  She is to resume any preoperative medications.  HISTORY OF PRESENT ILLNESS: PAST MEDICAL HISTORY: FAMILY HISTORY: REVIEW OF SYSTEMS: PHYSICAL EXAMINATION:         Details recorded in the admission note and/or in he operative summary.  Her physical findings on admission were remarkable for the pelvic findings as defined in the postoperative diagnoses.  HOSPITAL COURSE:              The patient was admitted on the morning of surgery. She was treated  perioperatively with antibiotics and with PAS hose.  The above described surgical procedure was accomplished without difficulty or significant  intraoperative complications.  Her postoperative recovery was remarkable only for slow return of bowel function and for inability to void at the time of her discharge, but her condition was considered to be satisfactory for discharge and she was discharged home with disposition as noted above.  For some reason, the patients laboratory date and the  pathology report is not included in the medical record as best I can determine.  I do know that she had a hemoglobin of 11.2 postoperatively and I am certain that it was higher than this preoperatively.  DD:  10/19/99 TD:  10/22/99 Job: [id number]

## 2010-10-23 ENCOUNTER — Encounter: Payer: Self-pay | Admitting: Internal Medicine

## 2010-10-29 ENCOUNTER — Ambulatory Visit (INDEPENDENT_AMBULATORY_CARE_PROVIDER_SITE_OTHER): Payer: Medicare Other | Admitting: Internal Medicine

## 2010-10-29 ENCOUNTER — Encounter: Payer: Self-pay | Admitting: Internal Medicine

## 2010-10-29 VITALS — BP 126/74 | HR 80 | Ht 62.0 in | Wt 118.0 lb

## 2010-10-29 DIAGNOSIS — R198 Other specified symptoms and signs involving the digestive system and abdomen: Secondary | ICD-10-CM

## 2010-10-29 DIAGNOSIS — K589 Irritable bowel syndrome without diarrhea: Secondary | ICD-10-CM

## 2010-10-29 MED ORDER — FAMOTIDINE 40 MG PO TABS: 40.0000 mg | ORAL_TABLET | Freq: Every evening | ORAL | Status: DC

## 2010-10-29 MED ORDER — DICYCLOMINE HCL 10 MG PO CAPS: ORAL_CAPSULE | ORAL | Status: DC

## 2010-10-29 NOTE — Progress Notes (Signed)
Karla Roman 1940-09-17 MRN [id number]     History of Present Illness:  This is a 70 year old white female with moderately severe diverticulosis of the left colon from a colonoscopy in October 2010 and family history of colon polyps. She is having urgent bowel movements and occasional incontinence. She had a tubular adenoma on a colonoscopy in 2010. She has been under a lot of stress taking care of her 3 grandchildren 3 days a week. She has a history of gastroesophageal reflux. An upper endoscopy in 1995 showed a Schatzki's ring and eccentric pylorus. She underwent an esophageal manometry which showed an essentially normal motility with lower esophageal sphincter being at the upper limits of normal. She has been on Prilosec 20 mg daily for several years. She also takes probiotics daily. She has gained about 10 pounds in the last several years.   Past Medical History  Diagnosis Date  . Diverticulitis   . GERD (gastroesophageal reflux disease)   . Atrophic vaginitis   . MVP (mitral valve prolapse)   . Vertigo   . Osteopenia   . Eustachian tube dysfunction   . Anemia     in childhood  . Hiatal hernia   . Sleep apnea   . Dyspareunia   . Diverticulosis   . Hx of adenomatous colonic polyps   . Schatzki's ring 1995   Past Surgical History  Procedure Date  . Appendectomy 1989  . Abdominal hysterectomy     with anterior and posterior vaginal repair  . Tonsillectomy   . Breast biopsy     RIGHT  . Septoplasty   . Cervical discectomy     C3-4, C5-6, C6-7  . Laparoscopy 04/2000    with lysis of adhesions  . Shoulder surgery     left  . Hand surgery     right    reports that she has never smoked. She has never used smokeless tobacco. She reports that she does not drink alcohol or use illicit drugs. family history includes Breast cancer in her maternal aunt; Colon cancer in her maternal aunt; Coronary artery disease in her brother and mother; Diabetes in her brother; Fibromyalgia  in her sister; Heart attack in her brother and father; Hypertension in her sister; Ovarian cancer in her maternal aunt; and Thyroid disease in her sister. Allergies  Allergen Reactions  . Codeine         Review of Systems: Denies dysphagia or odynophagia. Denies rectal bleeding or abdominal pain  The remainder of the 10  point ROS is negative except as outlined in H&P   Physical Exam: General appearance  Well developed, in no distress. Eyes- non icteric. HEENT nontraumatic, normocephalic. Mouth no lesions, tongue papillated, no cheilosis. Neck supple without adenopathy, thyroid not enlarged, no carotid bruits, no JVD. Lungs Clear to auscultation bilaterally. Cor normal S1 normal S2, regular rhythm , no murmur,  quiet precordium. Abdomen soft nontender abdomen but minimal discomfort in the left lower quadrant. No palpable mass or stool. Liver edge at costal margin. Right lower quadrant post appendectomy scar. Rectal: Slightly decreased rectal sphincter tone. Stool is Hemoccult negative. Extremities no pedal edema. Skin no lesions. Neurological alert and oriented x 3. Psychological normal mood and affect.  Assessment and Plan:  Problem #1 Change in bowel habits toward urgency and incontinence consistent with irritable bowel syndrome. Stress and poor eating habits resulting in weight gain are contributing factors here. I asked her to start  Metamucil or Benefiber daily and Bentyl 10 mg every morning  and every 6 hours when necessary prn urgency. I also suggested switching to Pepcid 40 mg daily or every other day. She is up-to-date on her colonoscopy. A recall will be due in October 2015.  Problem #2 Family history of colon polyps and a personal history of a tubular adenoma. A recall colonoscopy will be due in October 2015.   10/29/2010 Lina Sar

## 2010-10-29 NOTE — Patient Instructions (Addendum)
We have sent a prescription for Pepcid to your pharmacy in place of omeprazole. We have sent a prescription of Bentyl to your pharmacy. CC:Dr Norins

## 2010-11-15 ENCOUNTER — Telehealth: Payer: Self-pay | Admitting: *Deleted

## 2010-11-15 DIAGNOSIS — M899 Disorder of bone, unspecified: Secondary | ICD-10-CM

## 2010-11-15 NOTE — Telephone Encounter (Signed)
Pt has f/u apt 8/2. She is due for bone density (last was 10/2008). Should she get this prior to apt? If yes, please place orders and we will coordinate scheduling.

## 2010-11-16 NOTE — Telephone Encounter (Signed)
Please help set pt up for an apt for bone density, THANKS

## 2010-11-16 NOTE — Telephone Encounter (Signed)
Order entered

## 2010-11-27 ENCOUNTER — Ambulatory Visit (INDEPENDENT_AMBULATORY_CARE_PROVIDER_SITE_OTHER)
Admission: RE | Admit: 2010-11-27 | Discharge: 2010-11-27 | Disposition: A | Discharge status: Home / Self Care | Payer: Medicare Other | Source: Ambulatory Visit

## 2010-11-27 DIAGNOSIS — M899 Disorder of bone, unspecified: Secondary | ICD-10-CM

## 2010-11-27 DIAGNOSIS — M949 Disorder of cartilage, unspecified: Secondary | ICD-10-CM

## 2010-12-06 ENCOUNTER — Other Ambulatory Visit: Payer: Self-pay | Admitting: Internal Medicine

## 2010-12-06 ENCOUNTER — Other Ambulatory Visit (INDEPENDENT_AMBULATORY_CARE_PROVIDER_SITE_OTHER): Payer: Medicare Other

## 2010-12-06 DIAGNOSIS — Z0000 Encounter for general adult medical examination without abnormal findings: Secondary | ICD-10-CM

## 2010-12-06 DIAGNOSIS — I059 Rheumatic mitral valve disease, unspecified: Secondary | ICD-10-CM

## 2010-12-06 DIAGNOSIS — M899 Disorder of bone, unspecified: Secondary | ICD-10-CM

## 2010-12-06 DIAGNOSIS — Z79899 Other long term (current) drug therapy: Secondary | ICD-10-CM

## 2010-12-06 DIAGNOSIS — K219 Gastro-esophageal reflux disease without esophagitis: Secondary | ICD-10-CM

## 2010-12-06 DIAGNOSIS — M949 Disorder of cartilage, unspecified: Secondary | ICD-10-CM

## 2010-12-06 DIAGNOSIS — R079 Chest pain, unspecified: Secondary | ICD-10-CM

## 2010-12-06 LAB — URINALYSIS
Bilirubin Urine: NEGATIVE
Hgb urine dipstick: NEGATIVE
Leukocytes, UA: NEGATIVE
Nitrite: NEGATIVE
Specific Gravity, Urine: 1.02 (ref 1.000–1.030)
Total Protein, Urine: NEGATIVE
Urine Glucose: NEGATIVE
Urobilinogen, UA: 0.2 (ref 0.0–1.0)
pH: 6 (ref 5.0–8.0)

## 2010-12-06 LAB — BASIC METABOLIC PANEL
BUN: 14 mg/dL (ref 6–23)
CO2: 29 mEq/L (ref 19–32)
Calcium: 9.1 mg/dL (ref 8.4–10.5)
Chloride: 104 mEq/L (ref 96–112)
Creatinine, Ser: 0.9 mg/dL (ref 0.4–1.2)
GFR: 66.35 mL/min (ref >60.00)
Glucose, Bld: 97 mg/dL (ref 70–99)
Potassium: 4.2 mEq/L (ref 3.5–5.1)
Sodium: 139 mEq/L (ref 135–145)

## 2010-12-06 LAB — HEPATIC FUNCTION PANEL
ALT: 19 U/L (ref 0–35)
AST: 27 U/L (ref 0–37)
Albumin: 4.5 g/dL (ref 3.5–5.2)
Alkaline Phosphatase: 78 U/L (ref 39–117)
Bilirubin, Direct: 0.1 mg/dL (ref 0.0–0.3)
Total Bilirubin: 1 mg/dL (ref 0.3–1.2)
Total Protein: 7.5 g/dL (ref 6.0–8.3)

## 2010-12-06 LAB — CBC WITH DIFFERENTIAL/PLATELET
Basophils Absolute: 0 10*3/uL (ref 0.0–0.1)
Basophils Relative: 0.2 % (ref 0.0–3.0)
Eosinophils Absolute: 0.1 10*3/uL (ref 0.0–0.7)
Eosinophils Relative: 2 % (ref 0.0–5.0)
HCT: 38.5 % (ref 36.0–46.0)
Hemoglobin: 12.9 g/dL (ref 12.0–15.0)
Lymphocytes Relative: 19.9 % (ref 12.0–46.0)
Lymphs Abs: 1.1 10*3/uL (ref 0.7–4.0)
MCHC: 33.4 g/dL (ref 30.0–36.0)
MCV: 94.8 fl (ref 78.0–100.0)
Monocytes Absolute: 0.4 10*3/uL (ref 0.1–1.0)
Monocytes Relative: 7.2 % (ref 3.0–12.0)
Neutro Abs: 4.1 10*3/uL (ref 1.4–7.7)
Neutrophils Relative %: 70.7 % (ref 43.0–77.0)
Platelets: 203 10*3/uL (ref 150.0–400.0)
RBC: 4.06 Mil/uL (ref 3.87–5.11)
RDW: 13.9 % (ref 11.5–14.6)
WBC: 5.8 10*3/uL (ref 4.5–10.5)

## 2010-12-06 LAB — LDL CHOLESTEROL, DIRECT: Direct LDL: 100.4 mg/dL

## 2010-12-06 LAB — LIPID PANEL
Cholesterol: 220 mg/dL — ABNORMAL HIGH (ref 0–200)
HDL: 95.4 mg/dL (ref >39.00)
Total CHOL/HDL Ratio: 2
Triglycerides: 60 mg/dL (ref 0.0–149.0)
VLDL: 12 mg/dL (ref 0.0–40.0)

## 2010-12-06 LAB — TSH: TSH: 1.15 u[IU]/mL (ref 0.35–5.50)

## 2010-12-09 ENCOUNTER — Telehealth: Payer: Self-pay | Admitting: Internal Medicine

## 2010-12-09 ENCOUNTER — Encounter: Payer: Self-pay | Admitting: Internal Medicine

## 2010-12-09 NOTE — Telephone Encounter (Signed)
Please call pateint - DXA with normal spine and osteopenia hips. Plan is to continue to take calcium and Vit D; regular weight bearing exercise.  For questions - OV

## 2010-12-10 ENCOUNTER — Encounter: Payer: Self-pay | Admitting: Internal Medicine

## 2010-12-10 NOTE — Telephone Encounter (Signed)
Called pt per MD/LMOVM for her to call back

## 2010-12-11 NOTE — Telephone Encounter (Signed)
Pt called about test/has 12/13/2010 OV scheduled.

## 2010-12-12 NOTE — Telephone Encounter (Signed)
Informed pt she will discuss at Dec 13, 2010 appt

## 2010-12-13 ENCOUNTER — Ambulatory Visit (INDEPENDENT_AMBULATORY_CARE_PROVIDER_SITE_OTHER): Payer: Medicare Other | Admitting: Internal Medicine

## 2010-12-13 ENCOUNTER — Encounter: Payer: Self-pay | Admitting: Internal Medicine

## 2010-12-13 VITALS — BP 102/56 | HR 66 | Temp 97.7°F | Wt 110.0 lb

## 2010-12-13 DIAGNOSIS — Z0000 Encounter for general adult medical examination without abnormal findings: Secondary | ICD-10-CM

## 2010-12-13 DIAGNOSIS — R079 Chest pain, unspecified: Secondary | ICD-10-CM

## 2010-12-13 DIAGNOSIS — M949 Disorder of cartilage, unspecified: Secondary | ICD-10-CM

## 2010-12-13 DIAGNOSIS — K219 Gastro-esophageal reflux disease without esophagitis: Secondary | ICD-10-CM

## 2010-12-13 DIAGNOSIS — Z136 Encounter for screening for cardiovascular disorders: Secondary | ICD-10-CM

## 2010-12-13 DIAGNOSIS — M899 Disorder of bone, unspecified: Secondary | ICD-10-CM

## 2010-12-13 DIAGNOSIS — Z23 Encounter for immunization: Secondary | ICD-10-CM

## 2010-12-13 DIAGNOSIS — I059 Rheumatic mitral valve disease, unspecified: Secondary | ICD-10-CM

## 2010-12-13 DIAGNOSIS — N952 Postmenopausal atrophic vaginitis: Secondary | ICD-10-CM

## 2010-12-13 MED ORDER — PNEUMOCOCCAL VAC POLYVALENT 25 MCG/0.5ML IJ INJ: 0.5000 mL | INJECTION | Freq: Once | INTRAMUSCULAR | Status: DC

## 2010-12-13 NOTE — Progress Notes (Signed)
  Subjective:    Patient ID: Karla Roman, female    DOB: 1941/04/19, 70 y.o.   MRN: [id number]  HPI  The patient is here for annual Medicare wellness examination and management of other chronic and acute problems.   The risk factors are reflected in the social history.  The roster of all physicians providing medical care to patient - is listed in the Snapshot section of the chart.  Activities of daily living:  The patient is 100% inedpendent in all ADLs: dressing, toileting, feeding as well as independent mobility  Home safety : The patient has smoke detectors in the home. They wear seatbelts.No firearms at home ( firearms are present in the home, kept in a safe fashion). There is no violence in the home.   There is no risks for hepatitis, STDs or HIV. There is no   history of blood transfusion. They have no travel history to infectious disease endemic areas of the world.  The patient has not seen their dentist in the last six month. They have not  seen their eye doctor in the last year. They deny  any hearing difficulty and have not had audiologic testing in the last year.  They do not  have excessive sun exposure. Discussed the need for sun protection: hats, long sleeves and use of sunscreen if there is significant sun exposure.   Diet: the importance of a healthy diet is discussed. They do have a healthy (unhealthy-high fat/fast food) diet.  The patient has a regular exercise program: aerobics , 2 hour duration, 2-3 times per week.  The benefits of regular aerobic exercise were discussed.  Depression screen: there are no signs or vegative symptoms of depression- irritability, change in appetite, anhedonia, sadness/tearfullness.  Cognitive assessment: the patient manages all their financial and personal affairs and is actively engaged. They could relate day,date,year and events; recalled 3/3 objects at 3 minutes; performed clock-face test normally.  The following portions of the  patient's history were reviewed and updated as appropriate: allergies, current medications, past family history, past medical history,  past surgical history, past social history  and problem list.  Vision, hearing, body mass index were assessed and reviewed.   During the course of the visit the patient was educated and counseled about appropriate screening and preventive services including : fall prevention , diabetes screening, nutrition counseling, colorectal cancer screening, and recommended immunizations.   Review of Systems     Objective:   Physical Exam        Assessment & Plan:

## 2010-12-13 NOTE — Progress Notes (Signed)
Subjective:    Patient ID: Karla Roman, female    DOB: 1940-12-24, 70 y.o.   MRN: [id number]  HPI The patient is here for annual Medicare wellness examination and management of other chronic and acute problems.  The risk factors are reflected in the social history.  The roster of all physicians providing medical care to patient - is listed in the Snapshot section of the chart.  Activities of daily living: The patient is 100% inedpendent in all ADLs: dressing, toileting, feeding as well as independent mobility  Home safety : The patient has smoke detectors in the home. They wear seatbelts.No firearms at home. There is no violence in the home.  There is no risks for hepatitis, STDs or HIV. There is no history of blood transfusion. They have no travel history to infectious disease endemic areas of the world.  The patient has not seen their dentist in the last six month. They have not seen their eye doctor in the last year. They deny any hearing difficulty and have not had audiologic testing in the last year. They do not have excessive sun exposure. Discussed the need for sun protection: hats, long sleeves and use of sunscreen if there is significant sun exposure.  Diet: the importance of a healthy diet is discussed. They do have a healthy diet.  The patient has a regular exercise program: aerobics , 2 hour duration, 2-3 times per week. The benefits of regular aerobic exercise were discussed.  Depression screen: there are no signs or vegative symptoms of depression- irritability, change in appetite, anhedonia, sadness/tearfullness.  Cognitive assessment: the patient manages all their financial and personal affairs and is actively engaged. They could relate day,date,year and events; recalled 3/3 objects at 3 minutes; performed clock-face test normally.  The following portions of the patient's history were reviewed and updated as appropriate: allergies, current medications, past family history, past  medical history, past surgical history, past social history and problem list.  Vision, hearing, body mass index were assessed and reviewed.  During the course of the visit the patient was educated and counseled about appropriate screening and preventive services including : fall prevention , diabetes screening, nutrition counseling, colorectal cancer screening, and recommended immunizations.  Review of Systems    Review of Systems Review of Systems  Constitutional:  Negative for fever, chills, activity change and unexpected weight change.  HEENT:  Negative for hearing loss, ear pain, congestion, neck stiffness and postnasal drip. Negative for sore throat or swallowing problems. Negative for dental complaints.   Eyes: Negative for vision loss or change in visual acuity.  Respiratory: Negative for chest tightness and wheezing.   Cardiovascular: Negative for chest pain and palpitation. No decreased exercise tolerance Gastrointestinal: No change in bowel habit. No bloating or gas. No reflux or indigestion Genitourinary: Negative for urgency, frequency, flank pain and difficulty urinating.  Musculoskeletal: Negative for myalgias, back pain, arthralgias and gait problem.  Neurological: Negative for dizziness, tremors, weakness and headaches.  Hematological: Negative for adenopathy.  Psychiatric/Behavioral: Negative for behavioral problems and dysphoric mood.       Objective:   Physical Exam Vitals reviewed good BP. Gen'l: well nourished, well developed white woman in no distress HEENT - [place]/AT, EACs/TMs normal, oropharynx with native dentition in good condition, no buccal or palatal lesions, posterior pharynx clear, mucous membranes moist. C&S clear, PERRLA, fundi - poorly visualized Neck - supple, no thyromegaly Nodes- negative submental, cervical, supraclavicular regions Chest - no deformity, no CVAT Lungs - cleat without rales,  wheezes. No increased work of breathing Breast - deferred to  normal mammogram Cardiovascular - regular rate and rhythm, quiet precordium, no murmurs, rubs or gallops, 2+ radial, DP and PT pulses Abdomen - BS+ x 4, no HSM, no guarding or rebound or tenderness Pelvic - NEG/BUS normal, vaginal mucosa with minimal atrophic, absent cervix, no vulvar or vaginal lesions. Rectal - deferred to GI Extremities - no clubbing, cyanosis, edema or deformity.  Neuro - A&O x 3, CN II-XII normal, motor strength normal and equal, DTRs 2+ and symmetrical biceps, radial, and patellar tendons. Cerebellar - no tremor, no rigidity, fluid movement and normal gait. Derm - Head, neck, back, abdomen and extremities without suspicious lesions  Lab Results  Component Value Date   WBC 5.8 12/06/2010   HGB 12.9 12/06/2010   HCT 38.5 12/06/2010   PLT 203.0 12/06/2010   CHOL 220* 12/06/2010   TRIG 60.0 12/06/2010   HDL 95.40 12/06/2010   LDLDIRECT 100.4 12/06/2010   ALT 19 12/06/2010   AST 27 12/06/2010   NA 139 12/06/2010   K 4.2 12/06/2010   CL 104 12/06/2010   CREATININE 0.9 12/06/2010   BUN 14 12/06/2010   CO2 29 12/06/2010   TSH 1.15 12/06/2010           Assessment & Plan:

## 2010-12-15 DIAGNOSIS — Z0000 Encounter for general adult medical examination without abnormal findings: Secondary | ICD-10-CM | POA: Insufficient documentation

## 2010-12-15 NOTE — Assessment & Plan Note (Signed)
Interval medical history is benign. Physical exam is normal. Lab results are within normal limits. She is current with colorectal cancer screening with last study October '10. Last mammogram '11 and she is due for follow-up in '13. Immunizations: Tdap July '09; Shingles vaccine Sept '07; Pneumonia vaccine booster today. 12 lead EKG with T wave inversion V1 and V2 but no signs of ischemia or injury.  In summary - a very nice woman who is medically stable. She is current for health maintenance. She is encouraged to continue her exercise program, to see her dentist and to keep up with her ophthalmologist. She will return as needed or in 1 year.

## 2010-12-15 NOTE — Assessment & Plan Note (Signed)
Stable but the patient will have palpitations at the left chest. She is otherwise hemodynamically stable. No indication for any further diagnostic testing at this time.

## 2010-12-15 NOTE — Assessment & Plan Note (Signed)
Last DXA November 27, 2010 - T-scores: 0.3 AP spine; -1.6 left femoral neck; -1/7 right femoral neck.  Plan - patient had been on medical treatment for greater than 5 years and was taken off medication.           Osteopenia - will need 1200 mg calcium daily ( diet + supplement) and [id number] iu Vitamin D daily.

## 2010-12-15 NOTE — Assessment & Plan Note (Signed)
Stable on H2 blocker therapy along with probiotic.  No indication for EGD or additional testing.

## 2010-12-15 NOTE — Assessment & Plan Note (Signed)
Unremarkable exam today. She does not need any further threapy.  Plan - repeat exam in 3-5 years.(per ACOG guidelines)

## 2010-12-20 ENCOUNTER — Telehealth: Payer: Self-pay

## 2010-12-20 MED ORDER — FAMOTIDINE 40 MG PO TABS: 40.0000 mg | ORAL_TABLET | Freq: Every evening | ORAL | Status: DC

## 2010-12-20 NOTE — Telephone Encounter (Signed)
Patient called requesting  90 day rx for famotidine 40 mg qam. She would like this sent to [facility]. Patient notified

## 2010-12-29 ENCOUNTER — Ambulatory Visit (INDEPENDENT_AMBULATORY_CARE_PROVIDER_SITE_OTHER): Payer: Medicare Other | Admitting: Family Medicine

## 2010-12-29 DIAGNOSIS — J069 Acute upper respiratory infection, unspecified: Secondary | ICD-10-CM

## 2010-12-29 NOTE — Progress Notes (Signed)
  Subjective:    Patient ID: Karla Roman, female    DOB: 1941-04-11, 70 y.o.   MRN: [id number]  Sore Throat  This is a new problem. The current episode started yesterday. The problem has been gradually worsening. Neither side of throat is experiencing more pain than the other. There has been no fever. Associated symptoms include congestion, headaches and trouble swallowing. Pertinent negatives include no coughing, ear discharge, ear pain, hoarse voice, neck pain, shortness of breath or swollen glands. Associated symptoms comments: Chest feels tight, nasal congestion Post nasal drip. She has had exposure to strep. She has had no exposure to mono. She has tried acetaminophen for the symptoms. The treatment provided mild relief.      Review of Systems  Constitutional: Positive for fatigue. Negative for fever.  HENT: Positive for congestion, trouble swallowing and postnasal drip. Negative for ear pain, hoarse voice, rhinorrhea, sneezing, neck pain and ear discharge.   Eyes: Negative for pain and visual disturbance.  Respiratory: Negative for cough and shortness of breath.   Neurological: Positive for headaches.       Objective:   Physical Exam  Constitutional: Vital signs are normal. She appears well-developed and well-nourished. She is cooperative.  Non-toxic appearance. She does not appear ill. No distress.  HENT:  Head: Normocephalic.  Right Ear: Hearing, tympanic membrane, external ear and ear canal normal. Tympanic membrane is not erythematous, not retracted and not bulging.  Left Ear: Hearing, tympanic membrane, external ear and ear canal normal. Tympanic membrane is not erythematous, not retracted and not bulging.  Nose: Mucosal edema and rhinorrhea present. Right sinus exhibits no maxillary sinus tenderness and no frontal sinus tenderness. Left sinus exhibits no maxillary sinus tenderness and no frontal sinus tenderness.  Mouth/Throat: Uvula is midline, oropharynx is clear and  moist and mucous membranes are normal.  Eyes: Conjunctivae, EOM and lids are normal. Pupils are equal, round, and reactive to light. No foreign bodies found.  Neck: Trachea normal and normal range of motion. Neck supple. Carotid bruit is not present. No mass and no thyromegaly present.  Cardiovascular: Normal rate, regular rhythm, S1 normal, S2 normal, normal heart sounds, intact distal pulses and normal pulses.  Exam reveals no gallop and no friction rub.   No murmur heard. Pulmonary/Chest: Effort normal and breath sounds normal. Not tachypneic. No respiratory distress. She has no decreased breath sounds. She has no wheezes. She has no rhonchi. She has no rales.  Genitourinary: Vagina normal and uterus normal.  Neurological: She is alert.  Skin: Skin is warm, dry and intact. No rash noted.  Psychiatric: Her speech is normal and behavior is normal. Judgment normal. Her mood appears not anxious. Cognition and memory are normal. She does not exhibit a depressed mood.          Assessment & Plan:

## 2010-12-29 NOTE — Assessment & Plan Note (Signed)
Treat symptomatically. Reviewed viral timeline and care needed.

## 2010-12-29 NOTE — Patient Instructions (Addendum)
Start guafenesin to break up mucus...mucinex, robitussin. No decongestant. Try nasal saline irrigation. Expect symptoms to worsen over next 2-3 days , but then to improve. Viral infection timeline lasts 7-10 days. Call regular MD if not improving as expected or if shortness of breath. REST!

## 2011-02-14 ENCOUNTER — Ambulatory Visit: Payer: Medicare Other

## 2011-04-01 ENCOUNTER — Telehealth: Payer: Self-pay | Admitting: *Deleted

## 2011-04-01 ENCOUNTER — Ambulatory Visit (INDEPENDENT_AMBULATORY_CARE_PROVIDER_SITE_OTHER)
Admission: RE | Admit: 2011-04-01 | Discharge: 2011-04-01 | Disposition: A | Discharge status: Home / Self Care | Payer: Medicare Other | Source: Ambulatory Visit | Attending: Endocrinology | Admitting: Endocrinology

## 2011-04-01 ENCOUNTER — Encounter: Payer: Self-pay | Admitting: Endocrinology

## 2011-04-01 ENCOUNTER — Ambulatory Visit (INDEPENDENT_AMBULATORY_CARE_PROVIDER_SITE_OTHER): Payer: Medicare Other | Admitting: Endocrinology

## 2011-04-01 VITALS — BP 110/58 | HR 75 | Temp 98.1°F | Ht 62.0 in | Wt 112.5 lb

## 2011-04-01 DIAGNOSIS — R05 Cough: Secondary | ICD-10-CM

## 2011-04-01 DIAGNOSIS — R918 Other nonspecific abnormal finding of lung field: Secondary | ICD-10-CM

## 2011-04-01 MED ORDER — AZITHROMYCIN 500 MG PO TABS: 500.0000 mg | ORAL_TABLET | Freq: Every day | ORAL | Status: AC

## 2011-04-01 MED ORDER — FLUTICASONE-SALMETEROL 100-50 MCG/DOSE IN AEPB: 1.0000 | INHALATION_SPRAY | Freq: Two times a day (BID) | RESPIRATORY_TRACT | Status: DC

## 2011-04-01 MED ORDER — PROMETHAZINE-DM 6.25-15 MG/5ML PO SYRP: 5.0000 mL | ORAL_SOLUTION | Freq: Four times a day (QID) | ORAL | Status: AC | PRN

## 2011-04-01 NOTE — Patient Instructions (Addendum)
i have sent prescriptions for and antibiotic, cough syrup, and "advair-100."  take 1 puff 2x a day.  rinse mouth after using. I hope you feel better soon.  If you don't feel better by next week, please call dr Debby Bud.  A chest-x-ray is requested for you today.  please call [phone] to hear your test results.  You will be prompted to enter the 9-digit "MRN" number that appears at the top left of this page, followed by #.  Then you will hear the message. (update: i left message on phone-tree:  i ordered ct)

## 2011-04-01 NOTE — Telephone Encounter (Signed)
Chest XRay-Asymmetric added density apex right upper lobe-questionably increased in prominence-chest CT w/o contrast recommended for further evaluation-MD informed that results was available for review.

## 2011-04-01 NOTE — Progress Notes (Signed)
Subjective:    Patient ID: Karla Roman, female    DOB: [birth year], 70 y.o.   MRN: [id number]  HPI Pt states 3 weeks of slight prod-quality cough in the chest, and slight assoc wheezing.   Past Medical History  Diagnosis Date  . Diverticulitis   . GERD (gastroesophageal reflux disease)   . Atrophic vaginitis   . MVP (mitral valve prolapse)   . Vertigo   . Osteopenia   . Eustachian tube dysfunction   . Anemia     in childhood  . Hiatal hernia   . Sleep apnea   . Dyspareunia   . Diverticulosis   . Hx of adenomatous colonic polyps   . Schatzki's ring 1995  . Allergic rhinitis     Past Surgical History  Procedure Date  . Appendectomy 1989  . Abdominal hysterectomy     with anterior and posterior vaginal repair  . Tonsillectomy   . Breast biopsy     RIGHT  . Septoplasty   . Cervical discectomy     C3-4, C5-6, C6-7  . Laparoscopy 04/2000    with lysis of adhesions  . Shoulder surgery     left  . Hand surgery     right    History   Social History  . Marital Status: Married    Spouse Name: Vyctoria Dickman    Number of Children: N/A  . Years of Education: N/A   Occupational History  . retired    Social History Main Topics  . Smoking status: Never Smoker   . Smokeless tobacco: Never Used  . Alcohol Use: No  . Drug Use: No  . Sexually Active: Not on file   Other Topics Concern  . Not on file   Social History Narrative   HSGMarried is in good health4 grandchildren (1 set twins)    Current Outpatient Prescriptions on File Prior to Visit  Medication Sig Dispense Refill  . aspirin 81 MG tablet Take 81 mg by mouth daily.        . Calcium Carbonate (CALTRATE 600) 1500 MG TABS Take 1 tablet by mouth 2 (two) times daily. chewables      . conjugated estrogens (PREMARIN) vaginal cream Place vaginally 2 (two) times a week. As needed      . Cyanocobalamin (VITAMIN B-12 PO) Take 1 tablet by mouth daily.        . famotidine (PEPCID) 40 MG tablet  Take 1 tablet (40 mg total) by mouth every evening.  90 tablet  3  . fluticasone (FLONASE) 50 MCG/ACT nasal spray Place 1 spray into the nose daily as needed.        . Glucosamine-Chondroitin (GLUCOSAMINE CHONDR COMPLEX PO) Take 1 tablet by mouth daily. chewable      . Multiple Vitamins-Minerals (MULTIVITAMIN WITH MINERALS) tablet Take 1 tablet by mouth daily. chewables      . Omega-3 Fatty Acids (FISH OIL PO) Take 1 tablet by mouth daily.        . Probiotic Product (ALIGN) 4 MG CAPS Take by mouth 1 dose over 24 hours.        . vitamin D, CHOLECALCIFEROL, 400 UNITS tablet Take 400 Units by mouth daily.        . vitamin E 400 UNIT capsule Take 400 Units by mouth daily.        Marland Kitchen dicyclomine (BENTYL) 10 MG capsule Take 1 tablet by mouth every 4-6 hours as needed for IBS.  90 capsule  2  Current Facility-Administered Medications on File Prior to Visit  Medication Dose Route Frequency Provider Last Rate Last Dose  . pneumococcal 23 valent vaccine (PNU-IMMUNE) injection 0.5 mL  0.5 mL Intramuscular Once Duke Salvia, MD        Allergies  Allergen Reactions  . Codeine     Family History  Problem Relation Age of Onset  . Heart attack Father     after years of angina  . Coronary artery disease Father   . Coronary artery disease Mother   . Breast cancer Maternal Aunt   . Cancer Maternal Aunt     breast, colon, ovarian  . Diabetes Brother   . Coronary artery disease Brother   . Hypertension Sister   . Hyperthyroidism Sister   . Coronary artery disease Brother     PCI-stents   BP 110/58  Pulse 75  Temp(Src) 98.1 F (36.7 C) (Oral)  Ht 5\' 2"  (1.575 m)  Wt 112 lb 8 oz (51.03 kg)  BMI 20.58 kg/m2  SpO2 96%  Review of Systems Denies fever and nasal congestion.      Objective:   Physical Exam VITAL SIGNS:  See vs page GENERAL: no distress head: no deformity eyes: no periorbital swelling, no proptosis external nose and ears are normal mouth: no lesion seen Both tm's  are slightly red LUNGS:  Clear to auscultation, except for a few rales at the left base.  (i reviewed ct)    Assessment & Plan:  Acute bronchitis, new Abnormal cxr, new

## 2011-04-03 ENCOUNTER — Ambulatory Visit (INDEPENDENT_AMBULATORY_CARE_PROVIDER_SITE_OTHER)
Admission: RE | Admit: 2011-04-03 | Discharge: 2011-04-03 | Disposition: A | Discharge status: Home / Self Care | Payer: Medicare Other | Source: Ambulatory Visit | Attending: Endocrinology | Admitting: Endocrinology

## 2011-04-03 DIAGNOSIS — R918 Other nonspecific abnormal finding of lung field: Secondary | ICD-10-CM

## 2011-08-14 ENCOUNTER — Encounter: Payer: Self-pay | Admitting: Internal Medicine

## 2011-10-01 ENCOUNTER — Encounter: Payer: Self-pay | Admitting: Internal Medicine

## 2011-10-01 ENCOUNTER — Ambulatory Visit (INDEPENDENT_AMBULATORY_CARE_PROVIDER_SITE_OTHER): Payer: Medicare Other | Admitting: Internal Medicine

## 2011-10-01 VITALS — BP 130/60 | HR 66 | Temp 98.0°F | Ht 61.0 in | Wt 114.4 lb

## 2011-10-01 DIAGNOSIS — J069 Acute upper respiratory infection, unspecified: Secondary | ICD-10-CM

## 2011-10-01 DIAGNOSIS — J029 Acute pharyngitis, unspecified: Secondary | ICD-10-CM

## 2011-10-01 MED ORDER — AMOXICILLIN 500 MG PO CAPS: 500.0000 mg | ORAL_CAPSULE | Freq: Three times a day (TID) | ORAL | Status: AC

## 2011-10-01 NOTE — Patient Instructions (Signed)
It was good to see you today. If you develop worsening symptoms or fever,  we can reconsider antibiotics, but it does not appear necessary to use antibiotics at this time. If symptoms worse in next 72h (green sputum, fever or increase cough) use amoxicillin antibiotics - Your prescription(s) have been submitted to your pharmacy. Please take as directed and contact our office if you believe you are having problem(s) with the medication(s). Salt water gargle as discussed - see below Alternate between ibuprofen and tylenol for aches, pain and fever symptoms as discussed   Salt Water Gargle This solution will help make your mouth and throat feel better. HOME CARE INSTRUCTIONS    Mix 1 teaspoon of salt in 8 ounces of warm water.   Gargle with this solution as much or often as you need or as directed. Swish and gargle gently if you have any sores or wounds in your mouth.   Do not swallow this mixture.  Document Released: 02/01/2004 Document Revised: 04/18/2011 Document Reviewed: 06/24/2008 [facility] Patient Information 2012 [employer], [place].

## 2011-10-01 NOTE — Progress Notes (Signed)
  Subjective:    Patient ID: Karla Roman, female    DOB: [birth year], 71 y.o.   MRN: [id number]  Sore Throat  This is a new problem. The current episode started in the past 7 days. The problem has been gradually worsening. Neither side of throat is experiencing more pain than the other. There has been no fever. The pain is moderate. Associated symptoms include coughing, headaches and a hoarse voice. Pertinent negatives include no congestion, ear discharge, ear pain, shortness of breath, swollen glands or trouble swallowing. She has had no exposure to strep or mono. She has tried cool liquids and gargles for the symptoms. The treatment provided mild relief.   Past Medical History  Diagnosis Date  . Diverticulitis   . GERD (gastroesophageal reflux disease)   . Atrophic vaginitis   . MVP (mitral valve prolapse)   . Vertigo   . Osteopenia   . Eustachian tube dysfunction   . Anemia     in childhood  . Hiatal hernia   . Sleep apnea   . Dyspareunia   . Diverticulosis   . Hx of adenomatous colonic polyps   . Schatzki's ring 1995  . Allergic rhinitis     Review of Systems  HENT: Positive for hoarse voice. Negative for ear pain, congestion, trouble swallowing and ear discharge.   Respiratory: Positive for cough. Negative for shortness of breath.   Neurological: Positive for headaches.       Objective:   Physical Exam Constitutional: She appears well-developed and well-nourished. No distress.  HENT: Head: Normocephalic and atraumatic. Ears: B TMs ok, no erythema or effusion; Nose: Nose normal. Mouth/Throat: Oropharynx is red but clear and moist. No oropharyngeal exudate.  Eyes: Conjunctivae and EOM are normal. Pupils are equal, round, and reactive to light. No scleral icterus.  Neck: Normal range of motion. Neck supple. No JVD or LAD present. No thyromegaly present.  Cardiovascular: Normal rate, regular rhythm and normal heart sounds.  No murmur heard. No BLE edema. Pulmonary/Chest:  Effort normal and breath sounds normal. No respiratory distress. She has no wheezes.  Neurological: She is alert and oriented to person, place, and time. No cranial nerve deficit. Coordination normal.  Skin: Skin is warm and dry. No rash noted. No erythema.  Psychiatric: She has a normal mood and affect. Her behavior is normal. Judgment and thought content normal.   Lab Results  Component Value Date   WBC 5.8 12/06/2010   HGB 12.9 12/06/2010   HCT 38.5 12/06/2010   PLT 203.0 12/06/2010   GLUCOSE 97 12/06/2010   CHOL 220* 12/06/2010   TRIG 60.0 12/06/2010   HDL 95.40 12/06/2010   LDLDIRECT 100.4 12/06/2010   ALT 19 12/06/2010   AST 27 12/06/2010   NA 139 12/06/2010   K 4.2 12/06/2010   CL 104 12/06/2010   CREATININE 0.9 12/06/2010   BUN 14 12/06/2010   CO2 29 12/06/2010   TSH 1.15 12/06/2010       Assessment & Plan:  Viral URI with cough and pharyngitis  Explained lack of antibiotics efficiency on viral dz but empiric prescription for antibiotics provided to fill if sx worse or unimproved in next 72h - pt expresses understanding of same Salt water gargle Alternate ibuprofen/tylenol Call if worse or unimproved

## 2011-12-17 ENCOUNTER — Encounter: Payer: Medicare Other | Admitting: Internal Medicine

## 2011-12-23 ENCOUNTER — Encounter: Payer: Self-pay | Admitting: Internal Medicine

## 2011-12-23 ENCOUNTER — Ambulatory Visit (INDEPENDENT_AMBULATORY_CARE_PROVIDER_SITE_OTHER): Payer: Medicare Other | Admitting: Internal Medicine

## 2011-12-23 ENCOUNTER — Other Ambulatory Visit (INDEPENDENT_AMBULATORY_CARE_PROVIDER_SITE_OTHER): Payer: Medicare Other

## 2011-12-23 VITALS — BP 112/60 | HR 74 | Temp 98.6°F | Resp 16 | Wt 115.0 lb

## 2011-12-23 DIAGNOSIS — J309 Allergic rhinitis, unspecified: Secondary | ICD-10-CM

## 2011-12-23 DIAGNOSIS — Z0000 Encounter for general adult medical examination without abnormal findings: Secondary | ICD-10-CM

## 2011-12-23 DIAGNOSIS — I059 Rheumatic mitral valve disease, unspecified: Secondary | ICD-10-CM

## 2011-12-23 DIAGNOSIS — M949 Disorder of cartilage, unspecified: Secondary | ICD-10-CM

## 2011-12-23 DIAGNOSIS — K219 Gastro-esophageal reflux disease without esophagitis: Secondary | ICD-10-CM

## 2011-12-23 DIAGNOSIS — M899 Disorder of bone, unspecified: Secondary | ICD-10-CM

## 2011-12-23 LAB — COMPREHENSIVE METABOLIC PANEL
ALT: 22 U/L (ref 0–35)
AST: 27 U/L (ref 0–37)
Albumin: 4.5 g/dL (ref 3.5–5.2)
Alkaline Phosphatase: 73 U/L (ref 39–117)
BUN: 12 mg/dL (ref 6–23)
CO2: 30 mEq/L (ref 19–32)
Calcium: 9.6 mg/dL (ref 8.4–10.5)
Chloride: 102 mEq/L (ref 96–112)
Creatinine, Ser: 0.7 mg/dL (ref 0.4–1.2)
GFR: 81.86 mL/min (ref >60.00)
Glucose, Bld: 96 mg/dL (ref 70–99)
Potassium: 4.1 mEq/L (ref 3.5–5.1)
Sodium: 140 mEq/L (ref 135–145)
Total Bilirubin: 1 mg/dL (ref 0.3–1.2)
Total Protein: 7.6 g/dL (ref 6.0–8.3)

## 2011-12-23 LAB — CBC WITH DIFFERENTIAL/PLATELET
Basophils Absolute: 0 10*3/uL (ref 0.0–0.1)
Basophils Relative: 0.5 % (ref 0.0–3.0)
Eosinophils Absolute: 0.1 10*3/uL (ref 0.0–0.7)
Eosinophils Relative: 2 % (ref 0.0–5.0)
HCT: 41 % (ref 36.0–46.0)
Hemoglobin: 13.4 g/dL (ref 12.0–15.0)
Lymphocytes Relative: 25.2 % (ref 12.0–46.0)
Lymphs Abs: 1.7 10*3/uL (ref 0.7–4.0)
MCHC: 32.6 g/dL (ref 30.0–36.0)
MCV: 95.7 fl (ref 78.0–100.0)
Monocytes Absolute: 0.6 10*3/uL (ref 0.1–1.0)
Monocytes Relative: 8.5 % (ref 3.0–12.0)
Neutro Abs: 4.2 10*3/uL (ref 1.4–7.7)
Neutrophils Relative %: 63.8 % (ref 43.0–77.0)
Platelets: 200 10*3/uL (ref 150.0–400.0)
RBC: 4.28 Mil/uL (ref 3.87–5.11)
RDW: 13.5 % (ref 11.5–14.6)
WBC: 6.5 10*3/uL (ref 4.5–10.5)

## 2011-12-23 LAB — SEDIMENTATION RATE: Sed Rate: 7 mm/hr (ref 0–22)

## 2011-12-23 NOTE — Progress Notes (Signed)
Subjective:    Patient ID: Karla Roman, female    DOB: Aug 08, 1940, 71 y.o.   MRN: [id number]  HPI The patient is here for annual Medicare wellness examination and management of other chronic and acute problems.  CC: she has had increase in heart flutters; she has some fullness in the left neck. She is under a lot of stress - SO w/ rectal cancer: surgery-ileostomy; XRT; chemo.   The risk factors are reflected in the social history.  The roster of all physicians providing medical care to patient - is listed in the Snapshot section of the chart.  Activities of daily living:  The patient is 100% inedpendent in all ADLs: dressing, toileting, feeding as well as independent mobility  Home safety : The patient has smoke detectors in the home. They wear seatbelts. Fall- fall safe.   firearms are present in the home, kept in a safe fashion. There is no violence in the home.   There is no risks for hepatitis, STDs or HIV. There is no   history of blood transfusion. They have no travel history to infectious disease endemic areas of the world.  The patient has seen their dentist in the last six month. They have seen their eye doctor in the last year. They deny any hearing difficulty and have not had audiologic testing in the last year.    They do not  have excessive sun exposure. Discussed the need for sun protection: hats, long sleeves and use of sunscreen if there is significant sun exposure.   Diet: the importance of a healthy diet is discussed. They do have a healthy diet pretty much but she does have fast food several times a day.  The patient has a regular exercise program: aerobic video , 45 min duration, 5 per week.  The benefits of regular aerobic exercise were discussed.  Depression screen: there are no signs or vegative symptoms of depression- irritability, change in appetite, anhedonia, sadness/tearfullness. During her husband's illness she did have some tearfullness and  depression.  Cognitive assessment: the patient manages all their financial and personal affairs and is actively engaged.  The following portions of the patient's history were reviewed and updated as appropriate: allergies, current medications, past family history, past medical history,  past surgical history, past social history  and problem list.  Vision, hearing, body mass index were assessed and reviewed.   During the course of the visit the patient was educated and counseled about appropriate screening and preventive services including : fall prevention , diabetes screening, nutrition counseling, colorectal cancer screening, and recommended immunizations.  Past Medical History  Diagnosis Date  . Diverticulitis   . GERD (gastroesophageal reflux disease)   . Atrophic vaginitis   . MVP (mitral valve prolapse)   . Vertigo   . Osteopenia   . Eustachian tube dysfunction   . Anemia     in childhood  . Hiatal hernia   . Sleep apnea   . Dyspareunia   . Diverticulosis   . Hx of adenomatous colonic polyps   . Schatzki's ring 1995  . Allergic rhinitis    Past Surgical History  Procedure Date  . Appendectomy 1989  . Abdominal hysterectomy     with anterior and posterior vaginal repair  . Tonsillectomy   . Breast biopsy     RIGHT  . Septoplasty   . Cervical discectomy     C3-4, C5-6, C6-7  . Laparoscopy 04/2000    with lysis of adhesions  .  Shoulder surgery     left  . Hand surgery     right   Family History  Problem Relation Age of Onset  . Heart attack Father     after years of angina  . Coronary artery disease Father   . Coronary artery disease Mother   . Breast cancer Maternal Aunt   . Cancer Maternal Aunt     breast, colon, ovarian  . Diabetes Brother   . Coronary artery disease Brother   . Hypertension Sister   . Hyperthyroidism Sister   . Coronary artery disease Brother     PCI-stents   History   Social History  . Marital Status: Married    Spouse Name:  Tristine Langi    Number of Children: N/A  . Years of Education: 12   Occupational History  . retired    Social History Main Topics  . Smoking status: Never Smoker   . Smokeless tobacco: Never Used  . Alcohol Use: No  . Drug Use: No  . Sexually Active: Not Currently   Other Topics Concern  . Not on file   Social History Narrative   HSG. Married 1959. 1 son - ''64; 1 dtr - '80; 6 grandchildren (1 set of twins). Marriage is in good health - SO with rectal cancer. Work - retired.    Current Outpatient Prescriptions on File Prior to Visit  Medication Sig Dispense Refill  . aspirin 81 MG tablet Take 81 mg by mouth daily.        . Calcium Carbonate (CALTRATE 600) 1500 MG TABS Take 1 tablet by mouth 2 (two) times daily. chewables      . Cyanocobalamin (VITAMIN B-12 PO) Take 1 tablet by mouth daily.        . fluticasone (FLONASE) 50 MCG/ACT nasal spray Place 1 spray into the nose daily as needed.        . Multiple Vitamins-Minerals (MULTIVITAMIN WITH MINERALS) tablet Take 1 tablet by mouth daily. chewables      . Omega-3 Fatty Acids (FISH OIL PO) Take 1 tablet by mouth daily.        . Probiotic Product (ALIGN) 4 MG CAPS Take by mouth 1 dose over 24 hours.        . vitamin D, CHOLECALCIFEROL, 400 UNITS tablet Take 400 Units by mouth daily.        . vitamin E 400 UNIT capsule Take 400 Units by mouth daily.        . famotidine (PEPCID) 40 MG tablet Take 1 tablet (40 mg total) by mouth every evening.  90 tablet  3  .    1 each  0   Current Facility-Administered Medications on File Prior to Visit  Medication Dose Route Frequency Provider Last Rate Last Dose  . pneumococcal 23 valent vaccine (PNU-IMMUNE) injection 0.5 mL  0.5 mL Intramuscular Once Jacques Navy, MD           Review of Systems Constitutional:  Negative for fever, chills, activity change and unexpected weight change.  HEENT:  Negative for hearing loss, ear pain, congestion, neck stiffness and postnasal drip. Negative  for sore throat or swallowing problems. Negative for dental complaints.   Eyes: Negative for vision loss or change in visual acuity.  Respiratory: Negative for chest tightness and wheezing. Negative for DOE.   Cardiovascular: Negative for chest pain or palpitations. No decreased exercise tolerance Gastrointestinal: No change in bowel habit. No bloating or gas. No reflux or indigestion Genitourinary: Negative  for urgency, frequency, flank pain and difficulty urinating.  Musculoskeletal: Negative for myalgias, back pain, arthralgias and gait problem.  Neurological: Negative for dizziness, tremors, weakness and headaches.  Hematological: Negative for adenopathy.  Psychiatric/Behavioral: Negative for behavioral problems and dysphoric mood.       Objective:   Physical Exam Filed Vitals:   12/23/11 1004  BP: 112/60  Pulse: 74  Temp: 98.6 F (37 C)  Resp: 16   Wt Readings from Last 3 Encounters:  12/23/11 115 lb (52.164 kg)  10/01/11 114 lb 6.4 oz (51.891 kg)  04/01/11 112 lb 8 oz (51.03 kg)   Gen'l: well nourished, well developed white woman in no distress HEENT - [place]/AT, EACs/TMs normal, oropharynx with native dentition in good condition, no buccal or palatal lesions, posterior pharynx clear, mucous membranes moist. C&S clear, PERRLA, fundi - normal Neck - supple, no thyromegaly. Left neck with muscle tension, no fixed mass, lesion, lymphadenopathy Nodes- negative submental, cervical, supraclavicular regions Chest - no deformity, no CVAT Lungs - cleat without rales, wheezes. No increased work of breathing Breast -- Skin normal, nipples w/o discharge, dense tissue/large cystic type structure Left at 0200 position, Right at 11:00 position, mobile, non-tender, no axillary adenopathy. Cardiovascular - regular rate and rhythm, quiet precordium, no murmurs, rubs or gallops, 2+ radial, DP and PT pulses Abdomen - BS+ x 4, no HSM, no guarding or rebound or tenderness Pelvic - deferred  Rectal  - deferred  Extremities - no clubbing, cyanosis, edema or deformity.  Neuro - A&O x 3, CN II-XII normal, motor strength normal and equal, DTRs 2+ and symmetrical biceps, radial, and patellar tendons. Cerebellar - no tremor, no rigidity, fluid movement and normal gait. Derm - Head, neck, back, abdomen and extremities without suspicious lesions  Lab Results  Component Value Date   WBC 6.5 12/23/2011   HGB 13.4 12/23/2011   HCT 41.0 12/23/2011   PLT 200.0 12/23/2011   GLUCOSE 96 12/23/2011   CHOL 220* 12/06/2010   TRIG 60.0 12/06/2010   HDL 95.40 12/06/2010   LDLDIRECT 100.4 12/06/2010   ALT 22 12/23/2011   AST 27 12/23/2011   NA 140 12/23/2011   K 4.1 12/23/2011   CL 102 12/23/2011   CREATININE 0.7 12/23/2011   BUN 12 12/23/2011   CO2 30 12/23/2011   TSH 1.15 12/06/2010         Assessment & Plan:

## 2011-12-24 ENCOUNTER — Other Ambulatory Visit: Payer: Self-pay | Admitting: Internal Medicine

## 2011-12-24 NOTE — Assessment & Plan Note (Signed)
Last DEXA July 17, '12 - osteopenia bilateral femoral neck: -1.4, -1.6  Plan Calcium 1200 mg daily (diet plus supplement)  Vitamin D 800-1,000 iu daily  Weight bearing exercise.

## 2011-12-24 NOTE — Assessment & Plan Note (Signed)
Well controlled with H2 blocker - pepcid.

## 2011-12-24 NOTE — Assessment & Plan Note (Signed)
Interval history without new medical illness, injury or surgery. Physical exam, sans pelvic, normal. Lab results in normal range. Cholesterol panel from '12 was good and repeat study not indicated. She is current with colorectal and breast cancer screening. Immunizations are up to date.  In summary - a very nice woman who appears to be medically stable. She will continue her healthy ways and return in 1 year or sooner as needed.

## 2011-12-31 ENCOUNTER — Other Ambulatory Visit: Payer: Self-pay | Admitting: *Deleted

## 2011-12-31 MED ORDER — FAMOTIDINE 40 MG PO TABS: 40.0000 mg | ORAL_TABLET | Freq: Every evening | ORAL | Status: DC | PRN

## 2012-01-01 NOTE — Telephone Encounter (Signed)
completed

## 2012-02-20 ENCOUNTER — Ambulatory Visit (INDEPENDENT_AMBULATORY_CARE_PROVIDER_SITE_OTHER): Payer: Medicare Other | Admitting: General Practice

## 2012-02-20 DIAGNOSIS — Z23 Encounter for immunization: Secondary | ICD-10-CM

## 2012-09-16 ENCOUNTER — Ambulatory Visit (INDEPENDENT_AMBULATORY_CARE_PROVIDER_SITE_OTHER): Payer: Medicare Other | Admitting: Internal Medicine

## 2012-09-16 ENCOUNTER — Encounter: Payer: Self-pay | Admitting: Internal Medicine

## 2012-09-16 VITALS — BP 140/60 | HR 72 | Temp 98.1°F | Resp 16 | Wt 116.0 lb

## 2012-09-16 DIAGNOSIS — R030 Elevated blood-pressure reading, without diagnosis of hypertension: Secondary | ICD-10-CM

## 2012-09-16 DIAGNOSIS — M25561 Pain in right knee: Secondary | ICD-10-CM | POA: Insufficient documentation

## 2012-09-16 DIAGNOSIS — H6120 Impacted cerumen, unspecified ear: Secondary | ICD-10-CM

## 2012-09-16 DIAGNOSIS — M25569 Pain in unspecified knee: Secondary | ICD-10-CM

## 2012-09-16 DIAGNOSIS — J069 Acute upper respiratory infection, unspecified: Secondary | ICD-10-CM | POA: Insufficient documentation

## 2012-09-16 DIAGNOSIS — J020 Streptococcal pharyngitis: Secondary | ICD-10-CM

## 2012-09-16 DIAGNOSIS — H6122 Impacted cerumen, left ear: Secondary | ICD-10-CM

## 2012-09-16 MED ORDER — AZITHROMYCIN 250 MG PO TABS: ORAL_TABLET | ORAL | Status: DC

## 2012-09-16 NOTE — Assessment & Plan Note (Signed)
Will watch F/u w/Dr Norins in 4-6 wks

## 2012-09-16 NOTE — Patient Instructions (Addendum)
Use over-the-counter  "cold" medicines  such as  "Afrin" nasal spray for nasal congestion as directed instead. Use" Delsym" or" Robitussin" cough syrup varietis for cough.  You can use plain "Tylenol" or "Advil" for fever, chills and achyness.  Please, make an appointment if you are not better or if you're worse.  

## 2012-09-16 NOTE — Assessment & Plan Note (Signed)
Will irrigate when URI resolved

## 2012-09-16 NOTE — Assessment & Plan Note (Signed)
Strep test [facility] 

## 2012-09-16 NOTE — Progress Notes (Signed)
   Subjective:    Sore Throat  This is a new problem. The current episode started 1 to 4 weeks ago. The problem has been gradually worsening. Neither side of throat is experiencing more pain than the other. There has been no fever. The pain is moderate. Associated symptoms include coughing, headaches and a hoarse voice. Pertinent negatives include no congestion, ear discharge, ear pain, shortness of breath, swollen glands or trouble swallowing. She has had exposure to strep. She has had no exposure to mono. She has tried cool liquids and gargles for the symptoms. The treatment provided mild relief.  Cough This is a new problem. The current episode started 1 to 4 weeks ago. The cough is productive of brown sputum. Associated symptoms include headaches. Pertinent negatives include no ear pain or shortness of breath. The treatment provided no relief. There is no history of COPD.   C/o R knee pain at times  Past Medical History  Diagnosis Date  . Diverticulitis   . GERD (gastroesophageal reflux disease)   . Atrophic vaginitis   . MVP (mitral valve prolapse)   . Vertigo   . Osteopenia   . Eustachian tube dysfunction   . Anemia     in childhood  . Hiatal hernia   . Sleep apnea   . Dyspareunia   . Diverticulosis   . Hx of adenomatous colonic polyps   . Schatzki's ring 1995  . Allergic rhinitis     Review of Systems  HENT: Positive for hoarse voice. Negative for ear pain, congestion, trouble swallowing and ear discharge.   Respiratory: Positive for cough. Negative for shortness of breath.   Neurological: Positive for headaches.       Objective:   Physical Exam Constitutional: She appears well-developed and well-nourished. No distress.  HENT: Head: Normocephalic and atraumatic. Ears: B TMs ok, no erythema or effusion; L wax. Eryth throat  Nose: Nose normal. Mouth/Throat: Oropharynx is red but clear and moist. No oropharyngeal exudate.  Eyes: Conjunctivae and EOM are normal. Pupils  are equal, round, and reactive to light. No scleral icterus.  Neck: Normal range of motion. Neck supple. No JVD or LAD present. No thyromegaly present.  Cardiovascular: Normal rate, regular rhythm and normal heart sounds.  No murmur heard. No BLE edema. Pulmonary/Chest: Effort normal and breath sounds normal. No respiratory distress. She has no wheezes.  Neurological: She is alert and oriented to person, place, and time. No cranial nerve deficit. Coordination normal.  Skin: Skin is warm and dry. No rash noted. No erythema.  Psychiatric: She has a normal mood and affect. Her behavior is normal. Judgment and thought content normal.  L ear wax R knee NT  Lab Results  Component Value Date   WBC 6.5 12/23/2011   HGB 13.4 12/23/2011   HCT 41.0 12/23/2011   PLT 200.0 12/23/2011   GLUCOSE 96 12/23/2011   CHOL 220* 12/06/2010   TRIG 60.0 12/06/2010   HDL 95.40 12/06/2010   LDLDIRECT 100.4 12/06/2010   ALT 22 12/23/2011   AST 27 12/23/2011   NA 140 12/23/2011   K 4.1 12/23/2011   CL 102 12/23/2011   CREATININE 0.7 12/23/2011   BUN 12 12/23/2011   CO2 30 12/23/2011   TSH 1.15 12/06/2010       Assessment & Plan:    Call if worse or unimproved

## 2012-09-28 ENCOUNTER — Ambulatory Visit (INDEPENDENT_AMBULATORY_CARE_PROVIDER_SITE_OTHER): Payer: Medicare Other | Admitting: Internal Medicine

## 2012-09-28 ENCOUNTER — Encounter: Payer: Self-pay | Admitting: Internal Medicine

## 2012-09-28 VITALS — BP 140/70 | HR 80 | Temp 97.9°F | Resp 16 | Wt 119.0 lb

## 2012-09-28 DIAGNOSIS — H6120 Impacted cerumen, unspecified ear: Secondary | ICD-10-CM

## 2012-09-28 DIAGNOSIS — H6122 Impacted cerumen, left ear: Secondary | ICD-10-CM

## 2012-09-28 DIAGNOSIS — J309 Allergic rhinitis, unspecified: Secondary | ICD-10-CM

## 2012-09-28 NOTE — Assessment & Plan Note (Signed)
Claritin 10 mg a day prn ear symptoms

## 2012-09-28 NOTE — Progress Notes (Signed)
  Subjective:    Patient ID: Karla Roman, female    DOB: 1941/02/10, 72 y.o.   MRN: [id number]  HPI C/o L ear wax C/o occ discomfort in b ears   Review of Systems     Objective:   Physical Exam   R ear nl L ear w/wax   Procedure Note :     Procedure :  Ear irrigation   Indication:  Cerumen impaction L   Risks, including pain, dizziness, eardrum perforation, bleeding, infection and others as well as benefits were explained to the patient in detail. Verbal consent was obtained and the patient agreed to proceed.    We used "The Elephant Ear Irrigation Device" filled with lukewarm water for irrigation. A large amount wax was recovered. Procedure has also required manual wax removal with an ear loop.   Tolerated well. Complications: None.   Postprocedure instructions :  Call if problems.       Assessment & Plan:

## 2012-09-28 NOTE — Assessment & Plan Note (Signed)
See procedure 

## 2012-09-29 ENCOUNTER — Other Ambulatory Visit: Payer: Self-pay | Admitting: Dermatology

## 2012-10-11 HISTORY — PX: OTHER SURGICAL HISTORY: SHX169

## 2012-10-15 ENCOUNTER — Other Ambulatory Visit: Payer: Self-pay | Admitting: Dermatology

## 2012-11-30 ENCOUNTER — Encounter: Payer: Self-pay | Admitting: Internal Medicine

## 2012-12-23 ENCOUNTER — Ambulatory Visit (INDEPENDENT_AMBULATORY_CARE_PROVIDER_SITE_OTHER): Payer: Medicare Other

## 2012-12-23 ENCOUNTER — Ambulatory Visit (INDEPENDENT_AMBULATORY_CARE_PROVIDER_SITE_OTHER): Payer: Medicare Other | Admitting: Internal Medicine

## 2012-12-23 ENCOUNTER — Encounter: Payer: Self-pay | Admitting: Internal Medicine

## 2012-12-23 VITALS — BP 130/70 | HR 67 | Temp 97.5°F | Ht 62.25 in | Wt 116.0 lb

## 2012-12-23 DIAGNOSIS — Z136 Encounter for screening for cardiovascular disorders: Secondary | ICD-10-CM

## 2012-12-23 DIAGNOSIS — R5381 Other malaise: Secondary | ICD-10-CM

## 2012-12-23 DIAGNOSIS — K219 Gastro-esophageal reflux disease without esophagitis: Secondary | ICD-10-CM

## 2012-12-23 DIAGNOSIS — R7989 Other specified abnormal findings of blood chemistry: Secondary | ICD-10-CM

## 2012-12-23 DIAGNOSIS — Z0000 Encounter for general adult medical examination without abnormal findings: Secondary | ICD-10-CM

## 2012-12-23 DIAGNOSIS — I059 Rheumatic mitral valve disease, unspecified: Secondary | ICD-10-CM

## 2012-12-23 DIAGNOSIS — R5383 Other fatigue: Secondary | ICD-10-CM

## 2012-12-23 DIAGNOSIS — R030 Elevated blood-pressure reading, without diagnosis of hypertension: Secondary | ICD-10-CM

## 2012-12-23 LAB — COMPREHENSIVE METABOLIC PANEL
ALT: 20 U/L (ref 0–35)
AST: 26 U/L (ref 0–37)
Albumin: 4.3 g/dL (ref 3.5–5.2)
Alkaline Phosphatase: 68 U/L (ref 39–117)
BUN: 13 mg/dL (ref 6–23)
CO2: 29 mEq/L (ref 19–32)
Calcium: 9.4 mg/dL (ref 8.4–10.5)
Chloride: 106 mEq/L (ref 96–112)
Creatinine, Ser: 0.8 mg/dL (ref 0.4–1.2)
GFR: 75.7 mL/min (ref >60.00)
Glucose, Bld: 97 mg/dL (ref 70–99)
Potassium: 4.5 mEq/L (ref 3.5–5.1)
Sodium: 139 mEq/L (ref 135–145)
Total Bilirubin: 1 mg/dL (ref 0.3–1.2)
Total Protein: 7.3 g/dL (ref 6.0–8.3)

## 2012-12-23 LAB — HEPATIC FUNCTION PANEL
ALT: 20 U/L (ref 0–35)
AST: 26 U/L (ref 0–37)
Albumin: 4.3 g/dL (ref 3.5–5.2)
Alkaline Phosphatase: 68 U/L (ref 39–117)
Bilirubin, Direct: 0.1 mg/dL (ref 0.0–0.3)
Total Bilirubin: 1 mg/dL (ref 0.3–1.2)
Total Protein: 7.3 g/dL (ref 6.0–8.3)

## 2012-12-23 LAB — CBC WITH DIFFERENTIAL/PLATELET
Basophils Absolute: 0 10*3/uL (ref 0.0–0.1)
Basophils Relative: 0.3 % (ref 0.0–3.0)
Eosinophils Absolute: 0.1 10*3/uL (ref 0.0–0.7)
Eosinophils Relative: 1.8 % (ref 0.0–5.0)
HCT: 39.3 % (ref 36.0–46.0)
Hemoglobin: 13.2 g/dL (ref 12.0–15.0)
Lymphocytes Relative: 19 % (ref 12.0–46.0)
Lymphs Abs: 1.3 10*3/uL (ref 0.7–4.0)
MCHC: 33.5 g/dL (ref 30.0–36.0)
MCV: 94.3 fl (ref 78.0–100.0)
Monocytes Absolute: 0.5 10*3/uL (ref 0.1–1.0)
Monocytes Relative: 7 % (ref 3.0–12.0)
Neutro Abs: 4.8 10*3/uL (ref 1.4–7.7)
Neutrophils Relative %: 71.9 % (ref 43.0–77.0)
Platelets: 194 10*3/uL (ref 150.0–400.0)
RBC: 4.17 Mil/uL (ref 3.87–5.11)
RDW: 13.4 % (ref 11.5–14.6)
WBC: 6.7 10*3/uL (ref 4.5–10.5)

## 2012-12-23 LAB — LIPID PANEL
Cholesterol: 221 mg/dL — ABNORMAL HIGH (ref 0–200)
HDL: 88.9 mg/dL (ref >39.00)
Total CHOL/HDL Ratio: 2
Triglycerides: 56 mg/dL (ref 0.0–149.0)
VLDL: 11.2 mg/dL (ref 0.0–40.0)

## 2012-12-23 LAB — TSH: TSH: 1.22 u[IU]/mL (ref 0.35–5.50)

## 2012-12-23 MED ORDER — FLUTICASONE PROPIONATE 50 MCG/ACT NA SUSP: 1.0000 | Freq: Every day | NASAL | Status: DC | PRN

## 2012-12-23 MED ORDER — FAMOTIDINE 40 MG PO TABS: 40.0000 mg | ORAL_TABLET | Freq: Every evening | ORAL | Status: DC | PRN

## 2012-12-23 NOTE — Progress Notes (Signed)
Pepcid sent to [facility]. Cancelled pepcid and flonase that was sent to [facility]

## 2012-12-23 NOTE — Assessment & Plan Note (Signed)
Reports that her symptoms are controlled.

## 2012-12-23 NOTE — Patient Instructions (Addendum)
Thanks for coming in to see me.  Malaise and fatigue - physical exam is normal. Will check a battery of labs to look for any metabolic derangement. If all is normal we will need to consider whether this is an emotional issue.  Lab results will be mailed or available on MyChart

## 2012-12-23 NOTE — Assessment & Plan Note (Signed)
BP Readings from Last 3 Encounters:  12/23/12 130/70  09/28/12 140/70  09/16/12 140/60   Good control. No need for medication

## 2012-12-23 NOTE — Assessment & Plan Note (Signed)
Interval history unremarkable. Physical exam, sans pelvic, normal. Previous labs reviewed and in normal range. She is current with colorectal cancer screening and breast cancer screening. Immunizations are up to date.   In summary - a very nice woman who is medically stable. She will reutrn in 1 year or sooner as needed

## 2012-12-23 NOTE — Progress Notes (Signed)
Subjective:    Patient ID: Karla Roman, female    DOB: 15-Dec-1940, 72 y.o.   MRN: [id number]  HPI The patient is here for annual Medicare wellness examination and management of other chronic and acute problems.  She reports that she is feeling bad. Her husband has rectal cancer - there is some stress in the relationship: he is non-communicative and hard to please; she is keeping the grandchildren 4 days a week and every other weekend. She is sad, tearful, irritable, anhedonia, awakened by racing heart (anxiety). No fever or chills.   She had a mole excised - dysplastic nevus, June '14 (Dr. Yetta Barre)   The risk factors are reflected in the social history.  The roster of all physicians providing medical care to patient - is listed in the Snapshot section of the chart.  Activities of daily living:  The patient is 100% inedpendent in all ADLs: dressing, toileting, feeding as well as independent mobility  Home safety : The patient has smoke detectors in the home. Falls - fell off ladder. Home is fall safe. They wear seatbelts.  firearms are present in the home, kept in a safe fashion. There is no violence in the home.   There is no risks for hepatitis, STDs or HIV. There is no history of blood transfusion. They have no travel history to infectious disease endemic areas of the world.  The patient has seen their dentist in the last six month, crown replacement. They have seen their eye doctor in the last year. They deny any hearing difficulty and have not had audiologic testing in the last year.    They do not  have excessive sun exposure. Discussed the need for sun protection: hats, long sleeves and use of sunscreen if there is significant sun exposure.   Diet: the importance of a healthy diet is discussed. They do have a healthy  diet.  The patient has a regular exercise program: exercise tape , 60 min duration, 4-5 per week.  The benefits of regular aerobic exercise were  discussed.  Depression screen: there are signs or vegative symptoms of depression- irritability, change in appetite, anhedonia, sadness/tearfullness.  Cognitive assessment: the patient manages all their financial and personal affairs and is actively engaged.   The following portions of the patient's history were reviewed and updated as appropriate: allergies, current medications, past family history, past medical history,  past surgical history, past social history  and problem list.  Vision, hearing, body mass index were assessed and reviewed.   During the course of the visit the patient was educated and counseled about appropriate screening and preventive services including : fall prevention , diabetes screening, nutrition counseling, colorectal cancer screening, and recommended immunizations.  Past Medical History  Diagnosis Date  . Diverticulitis   . GERD (gastroesophageal reflux disease)   . Atrophic vaginitis   . MVP (mitral valve prolapse)   . Vertigo   . Osteopenia   . Eustachian tube dysfunction   . Anemia     in childhood  . Hiatal hernia   . Sleep apnea   . Dyspareunia   . Diverticulosis   . Hx of adenomatous colonic polyps   . Schatzki's ring 1995  . Allergic rhinitis    Past Surgical History  Procedure Laterality Date  . Appendectomy  1989  . Abdominal hysterectomy      with anterior and posterior vaginal repair  . Tonsillectomy    . Breast biopsy      RIGHT  .  Septoplasty    . Cervical discectomy      C3-4, C5-6, C6-7  . Laparoscopy  04/2000    with lysis of adhesions  . Shoulder surgery      left  . Hand surgery      right  . Melanoma excision      right thigh   Family History  Problem Relation Age of Onset  . Heart attack Father     after years of angina  . Coronary artery disease Father   . Coronary artery disease Mother   . Breast cancer Maternal Aunt   . Cancer Maternal Aunt     breast, colon, ovarian  . Diabetes Brother   . Coronary  artery disease Brother   . Hypertension Sister   . Hyperthyroidism Sister   . Coronary artery disease Brother     PCI-stents   History   Social History  . Marital Status: Married    Spouse Name: Leata Dominy    Number of Children: N/A  . Years of Education: 12   Occupational History  . retired    Social History Main Topics  . Smoking status: Never Smoker   . Smokeless tobacco: Never Used  . Alcohol Use: No  . Drug Use: No  . Sexual Activity: Not Currently   Other Topics Concern  . Not on file   Social History Narrative   HSG. Married 1959. 1 son - ''43; 1 dtr - '80; 6 grandchildren (1 set of twins). Marriage is in good health - SO with rectal cancer. Work - retired.     Current Outpatient Prescriptions on File Prior to Visit  Medication Sig Dispense Refill  . aspirin 81 MG tablet Take 81 mg by mouth daily.        . Calcium Carbonate (CALTRATE 600) 1500 MG TABS Take 1 tablet by mouth 2 (two) times daily. chewables      . Cyanocobalamin (VITAMIN B-12 PO) Take 1 tablet by mouth daily.        . famotidine (PEPCID) 40 MG tablet Take 1 tablet (40 mg total) by mouth at bedtime as needed.  90 tablet  3  . fluticasone (FLONASE) 50 MCG/ACT nasal spray Place 1 spray into the nose daily as needed.        . Misc Natural Products (GLUCOSAMINE CHOND COMPLEX/MSM PO) Take 2 tablets by mouth daily.      . Multiple Vitamins-Minerals (MULTIVITAMIN WITH MINERALS) tablet Take 1 tablet by mouth daily. chewables      . Omega-3 Fatty Acids (FISH OIL PO) Take 1 tablet by mouth daily.        . vitamin D, CHOLECALCIFEROL, 400 UNITS tablet Take 400 Units by mouth daily.        . vitamin E 400 UNIT capsule Take 400 Units by mouth daily.         Current Facility-Administered Medications on File Prior to Visit  Medication Dose Route Frequency Provider Last Rate Last Dose  . pneumococcal 23 valent vaccine (PNU-IMMUNE) injection 0.5 mL  0.5 mL Intramuscular Once Jacques Navy, MD           Review of Systems Constitutional:  Negative for fever, chills, activity change and unexpected weight change.  HEENT:  Negative for hearing loss, ear pain, congestion, neck stiffness and postnasal drip. Negative for sore throat or swallowing problems. positive for dental complaints - needs new crown.   Eyes: Negative for vision loss or change in visual acuity.  Respiratory: Negative for  chest tightness and wheezing. Negative for DOE.   Cardiovascular: Negative for chest pain or palpitations. No decreased exercise tolerance Gastrointestinal: No change in bowel habit. No bloating or gas.  reflux or indigestion Genitourinary: Negative for urgency, frequency, flank pain and difficulty urinating.  Musculoskeletal: Negative for myalgias, back pain, arthralgias and gait problem.  Neurological: Negative for dizziness, tremors, weakness and headaches.  Hematological: Negative for adenopathy.  Psychiatric/Behavioral: Negative for behavioral problems, positive for mild dysphoric mood.       Objective:   Physical Exam Filed Vitals:   12/23/12 0908  BP: 130/70  Pulse: 67  Temp: 97.5 F (36.4 C)   Wt Readings from Last 3 Encounters:  12/23/12 116 lb (52.617 kg)  09/28/12 119 lb (53.978 kg)  09/16/12 116 lb (52.617 kg)   Gen'l: well nourished, well developed white Woman in no distress HEENT - /AT, EACs/TMs normal, oropharynx with native dentition in good condition, no buccal or palatal lesions, posterior pharynx clear, mucous membranes moist. C&S clear, PERRLA, fundi - normal Neck - supple, no thyromegaly Nodes- negative submental, cervical, supraclavicular regions Chest - no deformity, no CVAT Lungs - cleat without rales, wheezes. No increased work of breathing Breast - - Skin normal, nipples w/o discharge, no fixed mass or lesion, no axillary adenopathy. 2 c m cystic structure 2:00 position left breast. Cardiovascular - regular rate and rhythm, quiet precordium, no murmurs, rubs or  gallops, 2+ radial, DP and PT pulses Abdomen - BS+ x 4, no HSM, no guarding or rebound or tenderness Pelvic - deferred to gyn Rectal - deferred to gyn Extremities - no clubbing, cyanosis, edema or deformity.  Neuro - A&O x 3, CN II-XII normal, motor strength normal and equal, DTRs 2+ and symmetrical biceps, radial, and patellar tendons. Cerebellar - no tremor, no rigidity, fluid movement and normal gait. Derm - Head, neck, back, abdomen and extremities without suspicious lesions         Assessment & Plan:

## 2012-12-24 LAB — LDL CHOLESTEROL, DIRECT: Direct LDL: 115.1 mg/dL

## 2013-02-10 ENCOUNTER — Ambulatory Visit (INDEPENDENT_AMBULATORY_CARE_PROVIDER_SITE_OTHER): Payer: Medicare Other

## 2013-02-10 DIAGNOSIS — Z23 Encounter for immunization: Secondary | ICD-10-CM

## 2013-03-13 HISTORY — PX: OTHER SURGICAL HISTORY: SHX169

## 2013-03-18 ENCOUNTER — Ambulatory Visit (INDEPENDENT_AMBULATORY_CARE_PROVIDER_SITE_OTHER)
Admission: RE | Admit: 2013-03-18 | Discharge: 2013-03-18 | Disposition: A | Discharge status: Home / Self Care | Payer: Medicare Other | Source: Ambulatory Visit | Attending: Internal Medicine | Admitting: Internal Medicine

## 2013-03-18 ENCOUNTER — Ambulatory Visit (INDEPENDENT_AMBULATORY_CARE_PROVIDER_SITE_OTHER): Payer: Medicare Other | Admitting: Internal Medicine

## 2013-03-18 ENCOUNTER — Encounter: Payer: Self-pay | Admitting: Internal Medicine

## 2013-03-18 VITALS — BP 138/62 | HR 80 | Temp 98.2°F | Wt 118.0 lb

## 2013-03-18 DIAGNOSIS — M25511 Pain in right shoulder: Secondary | ICD-10-CM

## 2013-03-18 DIAGNOSIS — S42309A Unspecified fracture of shaft of humerus, unspecified arm, initial encounter for closed fracture: Secondary | ICD-10-CM

## 2013-03-18 DIAGNOSIS — M25519 Pain in unspecified shoulder: Secondary | ICD-10-CM

## 2013-03-18 DIAGNOSIS — S42301A Unspecified fracture of shaft of humerus, right arm, initial encounter for closed fracture: Secondary | ICD-10-CM

## 2013-03-18 NOTE — Progress Notes (Signed)
Subjective:    Patient ID: Karla Roman, female    DOB: [birth year], 72 y.o.   MRN: [id number]  HPI Karla Roman fell about mid-day striking her right shoulder on the bed board and has had pain and immobility since. She did get nauseated at the time of the fall. Hand hurts but it is not numb.  Past Medical History  Diagnosis Date  . Diverticulitis   . GERD (gastroesophageal reflux disease)   . Atrophic vaginitis   . MVP (mitral valve prolapse)   . Vertigo   . Osteopenia   . Eustachian tube dysfunction   . Anemia     in childhood  . Hiatal hernia   . Sleep apnea   . Dyspareunia   . Diverticulosis   . Hx of adenomatous colonic polyps   . Schatzki's ring 1995  . Allergic rhinitis    Past Surgical History  Procedure Laterality Date  . Appendectomy  1989  . Abdominal hysterectomy      with anterior and posterior vaginal repair  . Tonsillectomy    . Breast biopsy      RIGHT  . Septoplasty    . Cervical discectomy      C3-4, C5-6, C6-7  . Laparoscopy  04/2000    with lysis of adhesions  . Shoulder surgery      left  . Hand surgery      right  . Melanoma excision      right thigh   Family History  Problem Relation Age of Onset  . Heart attack Father     after years of angina  . Coronary artery disease Father   . Coronary artery disease Mother   . Breast cancer Maternal Aunt   . Cancer Maternal Aunt     breast, colon, ovarian  . Diabetes Brother   . Coronary artery disease Brother   . Hypertension Sister   . Hyperthyroidism Sister   . Coronary artery disease Brother     PCI-stents   History   Social History  . Marital Status: Married    Spouse Name: Karla Roman    Number of Children: N/A  . Years of Education: 12   Occupational History  . retired    Social History Main Topics  . Smoking status: Never Smoker   . Smokeless tobacco: Never Used  . Alcohol Use: No  . Drug Use: No  . Sexual Activity: Not Currently   Other Topics Concern  . Not  on file   Social History Narrative   HSG. Married 1959. 1 son - ''89; 1 dtr - '80; 6 grandchildren (1 set of twins). Marriage is in good health - SO with rectal cancer. Work - retired.      Current Outpatient Prescriptions on File Prior to Visit  Medication Sig Dispense Refill  . aspirin 81 MG tablet Take 81 mg by mouth daily.        . Calcium Carbonate (CALTRATE 600) 1500 MG TABS Take 1 tablet by mouth 2 (two) times daily. chewables      . Cyanocobalamin (VITAMIN B-12 PO) Take 1 tablet by mouth daily.        . famotidine (PEPCID) 40 MG tablet Take 1 tablet (40 mg total) by mouth at bedtime as needed.  90 tablet  3  . fluticasone (FLONASE) 50 MCG/ACT nasal spray Place 1 spray into the nose daily as needed.  16 g  11  . Misc Natural Products (GLUCOSAMINE CHOND COMPLEX/MSM PO) Take 2 tablets  by mouth daily.      . Multiple Vitamins-Minerals (MULTIVITAMIN WITH MINERALS) tablet Take 1 tablet by mouth daily. chewables      . Omega-3 Fatty Acids (FISH OIL PO) Take 1 tablet by mouth daily.        . vitamin D, CHOLECALCIFEROL, 400 UNITS tablet Take 400 Units by mouth daily.        . vitamin E 400 UNIT capsule Take 400 Units by mouth daily.         Current Facility-Administered Medications on File Prior to Visit  Medication Dose Route Frequency Provider Last Rate Last Dose  . pneumococcal 23 valent vaccine (PNU-IMMUNE) injection 0.5 mL  0.5 mL Intramuscular Once Jacques Navy, MD           Review of Systems System review is negative for any constitutional, cardiac, pulmonary, GI or neuro symptoms or complaints other than as described in the HPI.     Objective:   Physical Exam Filed Vitals:   03/18/13 1608  BP: 138/62  Pulse: 80  Temp: 98.2 F (36.8 C)   Gen'l - WNWD woman who is uncomfortable Cor - RRR Pulm - normal respirations MSK - left UE/Shoulder w/o deformity, no drop-off of right shoulder. No palpable tenderness right hand, forearm, elbow. Very tender with passive ROM  with adduction, extension. No obvious swelling.  X-Ray Right UE: RIGHT SHOULDER - 1 VIEW  COMPARISON: None.  FINDINGS:  No dislocation is identified. There is a lucency identified to the  lateral aspect of the humeral head in the greater tuberosity  consistent with an undisplaced avulsion fracture. No other focal  bony abnormality is noted.  IMPRESSION:  Undisplaced fracture in the superior lateral aspect of the humerus.  It does not appear to involve the remainder of the humeral head.        Assessment & Plan:  Fracture right humeral head, non displaced  Plan Patient put into a sling  APAP and Aleve for pain  Contacted Dr. Stark Jock office - no acute treatment other than immobilization. She will be seen Monday, Nov 10th

## 2013-03-18 NOTE — Progress Notes (Signed)
Pre visit review using our clinic review tool, if applicable. No additional management support is needed unless otherwise documented below in the visit note. 

## 2013-03-18 NOTE — Patient Instructions (Signed)
Shoulder pain right, started after a fall. The x-ray did reveal a non-displaced facture of the humeral head. CLINICAL DATA: Recent traumatic injury with right shoulder pain  EXAM:  RIGHT SHOULDER - 1 VIEW  COMPARISON: None.  FINDINGS:  No dislocation is identified. There is a lucency identified to the  lateral aspect of the humeral head in the greater tuberosity  consistent with an undisplaced avulsion fracture. No other focal  bony abnormality is noted.  IMPRESSION:  Undisplaced fracture in the superior lateral aspect of the humerus.  It does not appear to involve the remainder of the humeral head.   Plan Immobilize with a sling  Tylenol 500 mg 1 or 2 every 6 hours on schedule  Aleve 1 o 2 every 12 hours for pain - not controlled with tylenol  [facility] - Dr. Stark [facility]ock office - will try to get you seen tomorrow.

## 2013-03-24 ENCOUNTER — Other Ambulatory Visit: Payer: Self-pay | Admitting: Internal Medicine

## 2013-03-24 MED ORDER — MOMETASONE FUROATE 50 MCG/ACT NA SUSP: 2.0000 | Freq: Every day | NASAL | Status: DC

## 2013-05-17 ENCOUNTER — Ambulatory Visit: Payer: Self-pay

## 2013-05-20 ENCOUNTER — Ambulatory Visit: Payer: Medicare Other | Admitting: Family Medicine

## 2013-05-20 ENCOUNTER — Encounter: Payer: Self-pay | Admitting: Internal Medicine

## 2013-05-20 ENCOUNTER — Ambulatory Visit (INDEPENDENT_AMBULATORY_CARE_PROVIDER_SITE_OTHER): Payer: Medicare Other | Admitting: Internal Medicine

## 2013-05-20 VITALS — BP 138/64 | HR 114 | Temp 100.5°F | Wt 112.0 lb

## 2013-05-20 DIAGNOSIS — J101 Influenza due to other identified influenza virus with other respiratory manifestations: Secondary | ICD-10-CM

## 2013-05-20 DIAGNOSIS — J111 Influenza due to unidentified influenza virus with other respiratory manifestations: Secondary | ICD-10-CM

## 2013-05-20 NOTE — Patient Instructions (Signed)
Classic influenza - the tamiflu may cause some of the nausea and diarrhea.  Plan Finish the tamiflu  Tylenol 1,000 mg three times a day  OK to use the onsdansterone for nausea  For Cough - robitussin DM 1 tsp every 4 hours as needed  Hydrate, rest, isolate yourself as long as you have fever.  Influenza, Adult Influenza ("the flu") is a viral infection of the respiratory tract. It occurs more often in winter months because people spend more time in close contact with one another. Influenza can make you feel very sick. Influenza easily spreads from person to person (contagious). CAUSES  Influenza is caused by a virus that infects the respiratory tract. You can catch the virus by breathing in droplets from an infected person's cough or sneeze. You can also catch the virus by touching something that was recently contaminated with the virus and then touching your mouth, nose, or eyes. SYMPTOMS  Symptoms typically last 4 to 10 days and may include:  Fever.  Chills.  Headache, body aches, and muscle aches.  Sore throat.  Chest discomfort and cough.  Poor appetite.  Weakness or feeling tired.  Dizziness.  Nausea or vomiting. DIAGNOSIS  Diagnosis of influenza is often made based on your history and a physical exam. A nose or throat swab test can be done to confirm the diagnosis. RISKS AND COMPLICATIONS You may be at risk for a more severe case of influenza if you smoke cigarettes, have diabetes, have chronic heart disease (such as heart failure) or lung disease (such as asthma), or if you have a weakened immune system. Elderly people and pregnant women are also at risk for more serious infections. The most common complication of influenza is a lung infection (pneumonia). Sometimes, this complication can require emergency medical care and may be life-threatening. PREVENTION  An annual influenza vaccination (flu shot) is the best way to avoid getting influenza. An annual flu shot is now  routinely recommended for all adults in the [place] TREATMENT  In mild cases, influenza goes away on its own. Treatment is directed at relieving symptoms. For more severe cases, your caregiver may prescribe antiviral medicines to shorten the sickness. Antibiotic medicines are not effective, because the infection is caused by a virus, not by bacteria. HOME CARE INSTRUCTIONS  Only take over-the-counter or prescription medicines for pain, discomfort, or fever as directed by your caregiver.  Use a cool mist humidifier to make breathing easier.  Get plenty of rest until your temperature returns to normal. This usually takes 3 to 4 days.  Drink enough fluids to keep your urine clear or pale yellow.  Cover your mouth and nose when coughing or sneezing, and wash your hands well to avoid spreading the virus.  Stay home from work or school until your fever has been gone for at least 1 full day. SEEK MEDICAL CARE IF:   You have chest pain or a deep cough that worsens or produces more mucus.  You have nausea, vomiting, or diarrhea. SEEK IMMEDIATE MEDICAL CARE IF:   You have difficulty breathing, shortness of breath, or your skin or nails turn bluish.  You have severe neck pain or stiffness.  You have a severe headache, facial pain, or earache.  You have a worsening or recurring fever.  You have nausea or vomiting that cannot be controlled. MAKE SURE YOU:  Understand these instructions.  Will watch your condition.  Will get help right away if you are not doing well or get worse. Document  Released: 04/26/2000 Document Revised: 10/29/2011 Document Reviewed: 07/29/2011 [facility] Patient Information 2014 [place], [place].

## 2013-05-20 NOTE — Progress Notes (Signed)
   Subjective:    Patient ID: Karla Roman, female    DOB: 1941-02-27, 73 y.o.   MRN: [id number]  HPI Karla Roman presents for on-going fever - husband was seen at urgent care and tested positive for influenza. She developed similar symptoms and was started on Tamiflu Monday. She has had diarrhea and continued fever, cough with sputum  She has developed a frozen right shoulder after fracture. She is to be doing PT when she feels better.  PMH, FamHx and SocHx reviewed for any changes and relevance. Current Outpatient Prescriptions on File Prior to Visit  Medication Sig Dispense Refill  . aspirin 81 MG tablet Take 81 mg by mouth daily.        . Calcium Carbonate (CALTRATE 600) 1500 MG TABS Take 1 tablet by mouth 2 (two) times daily. chewables      . Cyanocobalamin (VITAMIN B-12 PO) Take 1 tablet by mouth daily.        . famotidine (PEPCID) 40 MG tablet Take 1 tablet (40 mg total) by mouth at bedtime as needed.  90 tablet  3  . Misc Natural Products (GLUCOSAMINE CHOND COMPLEX/MSM PO) Take 2 tablets by mouth daily.      . mometasone (NASONEX) 50 MCG/ACT nasal spray Place 2 sprays into the nose daily.  17 g  12  . Multiple Vitamins-Minerals (MULTIVITAMIN WITH MINERALS) tablet Take 1 tablet by mouth daily. chewables      . Omega-3 Fatty Acids (FISH OIL PO) Take 1 tablet by mouth daily.        . vitamin D, CHOLECALCIFEROL, 400 UNITS tablet Take 400 Units by mouth daily.        . vitamin E 400 UNIT capsule Take 400 Units by mouth daily.         Current Facility-Administered Medications on File Prior to Visit  Medication Dose Route Frequency Provider Last Rate Last Dose  . pneumococcal 23 valent vaccine (PNU-IMMUNE) injection 0.5 mL  0.5 mL Intramuscular Once Neena Rhymes, MD         Review of Systems System review is negative for any constitutional, cardiac, pulmonary, GI or neuro symptoms or complaints other than as described in the HPI.     Objective:   Physical Exam Filed  Vitals:   05/20/13 0810  BP: 138/64  Pulse: 114  Temp: 100.5 F (38.1 C)   gen'l- WNWD woman in no acute distress HEENT - TMs normal, throat clear Cor - RRR Pulm - clear to A&P Neuro - normal       Assessment & Plan:  Classic influenza - the tamiflu may cause some of the nausea and diarrhea.  Plan Finish the tamiflu  Tylenol 1,000 mg three times a day  OK to use the onsdansterone for nausea  For Cough - robitussin DM 1 tsp every 4 hours as needed  Hydrate, rest, isolate yourself as long as you have fever.

## 2013-05-20 NOTE — Progress Notes (Signed)
Pre visit review using our clinic review tool, if applicable. No additional management support is needed unless otherwise documented below in the visit note. 

## 2013-05-21 ENCOUNTER — Telehealth: Payer: Self-pay | Admitting: Internal Medicine

## 2013-05-21 MED ORDER — PROMETHAZINE HCL 12.5 MG PO TABS: 12.5000 mg | ORAL_TABLET | Freq: Four times a day (QID) | ORAL | Status: DC | PRN

## 2013-05-21 NOTE — Telephone Encounter (Signed)
Called patient back and gave her MD message.   She asks about the diarrhea. She has taken Immodium yesterday and today and it's not helping. Please advise. Thanks!

## 2013-05-21 NOTE — Telephone Encounter (Signed)
Patient has been notified

## 2013-05-21 NOTE — Telephone Encounter (Signed)
Attempted to notify patient no answer/no voicemail

## 2013-05-21 NOTE — Telephone Encounter (Signed)
Pt called stated she has been taking tamiflu for 2 days now and her symptom is not getting any better, she has diarrhea and can not eat anything without throwing it back up. Pt does not know what to do. Please advise.

## 2013-05-21 NOTE — Telephone Encounter (Signed)
the phenergan may help the diarrhea

## 2013-05-21 NOTE — Telephone Encounter (Signed)
Stop tamiflu Try phenergan

## 2013-05-21 NOTE — Telephone Encounter (Signed)
Dr Ronnald Ramp do you mind addressing since Dr Linda Hedges is not here.

## 2013-05-22 ENCOUNTER — Emergency Department (HOSPITAL_COMMUNITY): Payer: Medicare Other

## 2013-05-22 ENCOUNTER — Emergency Department (HOSPITAL_COMMUNITY)
Admission: EM | Admit: 2013-05-22 | Discharge: 2013-05-22 | Disposition: A | Discharge status: Home / Self Care | Payer: Medicare Other | Attending: Emergency Medicine | Admitting: Emergency Medicine

## 2013-05-22 ENCOUNTER — Encounter (HOSPITAL_COMMUNITY): Payer: Self-pay | Admitting: Emergency Medicine

## 2013-05-22 DIAGNOSIS — R062 Wheezing: Secondary | ICD-10-CM | POA: Insufficient documentation

## 2013-05-22 DIAGNOSIS — Z7982 Long term (current) use of aspirin: Secondary | ICD-10-CM | POA: Insufficient documentation

## 2013-05-22 DIAGNOSIS — IMO0001 Reserved for inherently not codable concepts without codable children: Secondary | ICD-10-CM | POA: Insufficient documentation

## 2013-05-22 DIAGNOSIS — IMO0002 Reserved for concepts with insufficient information to code with codable children: Secondary | ICD-10-CM | POA: Insufficient documentation

## 2013-05-22 DIAGNOSIS — J3489 Other specified disorders of nose and nasal sinuses: Secondary | ICD-10-CM | POA: Insufficient documentation

## 2013-05-22 DIAGNOSIS — R509 Fever, unspecified: Secondary | ICD-10-CM | POA: Insufficient documentation

## 2013-05-22 DIAGNOSIS — K219 Gastro-esophageal reflux disease without esophagitis: Secondary | ICD-10-CM | POA: Insufficient documentation

## 2013-05-22 DIAGNOSIS — J189 Pneumonia, unspecified organism: Secondary | ICD-10-CM | POA: Insufficient documentation

## 2013-05-22 DIAGNOSIS — R110 Nausea: Secondary | ICD-10-CM | POA: Insufficient documentation

## 2013-05-22 DIAGNOSIS — R197 Diarrhea, unspecified: Secondary | ICD-10-CM | POA: Insufficient documentation

## 2013-05-22 MED ORDER — CEFTRIAXONE SODIUM 1 G IJ SOLR
1.0000 g | INTRAMUSCULAR | Status: DC
Start: 2013-05-22 — End: 2013-05-23
  Administered 2013-05-22: 1 g via INTRAVENOUS
  Filled 2013-05-22: qty 10

## 2013-05-22 MED ORDER — ALBUTEROL SULFATE (2.5 MG/3ML) 0.083% IN NEBU
5.0000 mg | INHALATION_SOLUTION | Freq: Once | RESPIRATORY_TRACT | Status: AC
  Administered 2013-05-22: 5 mg via RESPIRATORY_TRACT
  Filled 2013-05-22: qty 6

## 2013-05-22 MED ORDER — AZITHROMYCIN 250 MG PO TABS
500.0000 mg | ORAL_TABLET | Freq: Once | ORAL | Status: DC
  Filled 2013-05-22: qty 2

## 2013-05-22 MED ORDER — LEVOFLOXACIN 500 MG PO TABS: 500.0000 mg | ORAL_TABLET | Freq: Every day | ORAL | Status: DC

## 2013-05-22 MED ORDER — ALBUTEROL SULFATE HFA 108 (90 BASE) MCG/ACT IN AERS: 2.0000 | INHALATION_SPRAY | RESPIRATORY_TRACT | Status: DC | PRN

## 2013-05-22 MED ORDER — SODIUM CHLORIDE 0.9 % IV BOLUS (SEPSIS)
1000.0000 mL | Freq: Once | INTRAVENOUS | Status: AC
  Administered 2013-05-22: 1000 mL via INTRAVENOUS

## 2013-05-22 MED ORDER — SODIUM CHLORIDE 0.9 % IV BOLUS (SEPSIS)
500.0000 mL | Freq: Once | INTRAVENOUS | Status: AC
  Administered 2013-05-22: 500 mL via INTRAVENOUS

## 2013-05-22 MED ORDER — IPRATROPIUM BROMIDE 0.02 % IN SOLN
0.5000 mg | Freq: Once | RESPIRATORY_TRACT | Status: AC
  Administered 2013-05-22: 0.5 mg via RESPIRATORY_TRACT
  Filled 2013-05-22: qty 2.5

## 2013-05-22 MED ORDER — ONDANSETRON HCL 4 MG/2ML IJ SOLN
4.0000 mg | Freq: Once | INTRAMUSCULAR | Status: AC
  Administered 2013-05-22: 4 mg via INTRAVENOUS
  Filled 2013-05-22: qty 2

## 2013-05-22 MED ORDER — DEXTROSE 5 % IV SOLN
500.0000 mg | INTRAVENOUS | Status: DC
  Administered 2013-05-22: 500 mg via INTRAVENOUS

## 2013-05-22 MED ORDER — LEVOFLOXACIN 25 MG/ML PO SOLN: 500.0000 mg | Freq: Every day | ORAL | Status: DC

## 2013-05-22 NOTE — ED Notes (Signed)
Pt has nausea vomiting and diarrhea  With cough fever and chills since Monday pt was feeling better but began to feel worse last night

## 2013-05-22 NOTE — ED Provider Notes (Addendum)
CSN: [id number]     Arrival date & time 05/22/13  1758 History   First MD Initiated Contact with Patient 05/22/13 1801     Chief complaint: cough, congestion, diarrhea   (Consider location/radiation/quality/duration/timing/severity/associated sxs/prior Treatment) The history is provided by the patient and the spouse.  pt notes onset body aches, non productive cough, congestion, 5 days ago. Subjective fever. States spouse tested positive for flu day prior at pcp office, she went there 5 days ago and test was neg for flu, but was prescribed tamiflu. States cough persists, ?wheezing, and now also w diarrhea and nausea. Several episodes diarrhea, not bloody or bilious. No abd pain or distension. No dysuria or gu c/o.      Past Medical History  Diagnosis Date  . Diverticulitis   . GERD (gastroesophageal reflux disease)   . Atrophic vaginitis   . MVP (mitral valve prolapse)   . Vertigo   . Osteopenia   . Eustachian tube dysfunction   . Anemia     in childhood  . Hiatal hernia   . Sleep apnea   . Dyspareunia   . Diverticulosis   . Hx of adenomatous colonic polyps   . Schatzki's ring 1995  . Allergic rhinitis    Past Surgical History  Procedure Laterality Date  . Appendectomy  1989  . Abdominal hysterectomy      with anterior and posterior vaginal repair  . Tonsillectomy    . Breast biopsy      RIGHT  . Septoplasty    . Cervical discectomy      C3-4, C5-6, C6-7  . Laparoscopy  04/2000    with lysis of adhesions  . Shoulder surgery      left  . Hand surgery      right  . Melanoma excision      right thigh   Family History  Problem Relation Age of Onset  . Heart attack Father     after years of angina  . Coronary artery disease Father   . Coronary artery disease Mother   . Breast cancer Maternal Aunt   . Cancer Maternal Aunt     breast, colon, ovarian  . Diabetes Brother   . Coronary artery disease Brother   . Hypertension Sister   . Hyperthyroidism Sister   .  Coronary artery disease Brother     PCI-stents   History  Substance Use Topics  . Smoking status: Never Smoker   . Smokeless tobacco: Never Used  . Alcohol Use: No   OB History   Grav Para Term Preterm Abortions TAB SAB Ect Mult Living                 Review of Systems  Constitutional: Negative for fever and chills.  HENT: Positive for congestion and rhinorrhea. Negative for sore throat.   Eyes: Negative for redness.  Respiratory: Positive for cough.   Cardiovascular: Negative for chest pain and leg swelling.  Gastrointestinal: Positive for nausea and diarrhea. Negative for abdominal pain and abdominal distention.  Genitourinary: Negative for dysuria and flank pain.  Musculoskeletal: Negative for back pain and neck pain.  Skin: Negative for rash.  Neurological: Negative for headaches.  Hematological: Does not bruise/bleed easily.  Psychiatric/Behavioral: Negative for confusion.    Allergies  Codeine  Home Medications   Current Outpatient Rx  Name  Route  Sig  Dispense  Refill  . Acetaminophen (TYLENOL EXTRA STRENGTH PO)   Oral   Take by mouth.         Marland Kitchen  aspirin 81 MG tablet   Oral   Take 81 mg by mouth daily.           . Calcium Carbonate (CALTRATE 600) 1500 MG TABS   Oral   Take 1 tablet by mouth 2 (two) times daily. chewables         . Cyanocobalamin (VITAMIN B-12 PO)   Oral   Take 1 tablet by mouth daily.           . famotidine (PEPCID) 40 MG tablet   Oral   Take 1 tablet (40 mg total) by mouth at bedtime as needed.   90 tablet   3   . GuaiFENesin (MUCINEX PO)   Oral   Take by mouth.         . Misc Natural Products (GLUCOSAMINE CHOND COMPLEX/MSM PO)   Oral   Take 2 tablets by mouth daily.         . mometasone (NASONEX) 50 MCG/ACT nasal spray   Nasal   Place 2 sprays into the nose daily.   17 g   12   . Multiple Vitamins-Minerals (MULTIVITAMIN WITH MINERALS) tablet   Oral   Take 1 tablet by mouth daily. chewables         .  Omega-3 Fatty Acids (FISH OIL PO)   Oral   Take 1 tablet by mouth daily.           Marland Kitchen oseltamivir (TAMIFLU) 75 MG capsule   Oral   Take 75 mg by mouth 2 (two) times daily.         . predniSONE (DELTASONE) 10 MG tablet   Oral   Take 10 mg by mouth daily.         . prochlorperazine (COMPAZINE) 10 MG tablet   Oral   Take 10 mg by mouth every 6 (six) hours as needed for nausea or vomiting.         . promethazine (PHENERGAN) 12.5 MG tablet   Oral   Take 1 tablet (12.5 mg total) by mouth every 6 (six) hours as needed for nausea or vomiting.   30 tablet   0   . vitamin D, CHOLECALCIFEROL, 400 UNITS tablet   Oral   Take 400 Units by mouth daily.           . vitamin E 400 UNIT capsule   Oral   Take 400 Units by mouth daily.            BP 156/59  Pulse 104  Temp(Src) 98.5 F (36.9 C) (Oral)  Resp 18  SpO2 92% Physical Exam  Nursing note and vitals reviewed. Constitutional: She is oriented to person, place, and time. She appears well-developed and well-nourished. No distress.  HENT:  Mouth/Throat: Oropharynx is clear and moist.  Eyes: Conjunctivae are normal. No scleral icterus.  Neck: Neck supple. No tracheal deviation present.  No stiffness or rigidity  Cardiovascular: Normal rate, regular rhythm, normal heart sounds and intact distal pulses.  Exam reveals no gallop and no friction rub.   No murmur heard. Pulmonary/Chest: Effort normal. No respiratory distress. She has wheezes.  Rhonchi LLL  Abdominal: Soft. Normal appearance and bowel sounds are normal. She exhibits no distension and no mass. There is no tenderness. There is no rebound and no guarding.  Genitourinary:  No cva tenderness  Musculoskeletal: She exhibits no edema.  Neurological: She is alert and oriented to person, place, and time.  Skin: Skin is warm and dry. No rash noted.  No rash, no petechia.   Psychiatric: She has a normal mood and affect.    ED Course  Procedures (including critical  care time)   Dg Chest 2 View  05/22/2013   CLINICAL DATA:  Productive cough  EXAM: CHEST  2 VIEW  COMPARISON:  04/01/2011  FINDINGS: Cardiac shadow is within normal limits. The lungs are well aerated bilaterally but demonstrate bibasilar infiltrative changes. No acute bony abnormality is noted. Previously seen density in the right lung apex is less well visualized on the current exam.  IMPRESSION: Bibasilar infiltrates.   Electronically Signed   By: Inez Catalina M.D.   On: 05/22/2013 18:31     EKG Interpretation   None       MDM  Iv ns bolus. zofran iv. Albuterol and atrovent neb.  Reviewed nursing notes and prior charts for additional history.   Pt feels improved. Given cough, fevers, chest congestion on exam, and infiltrate on cxr, will rx for suspected cap.  Rocephin and zithromax.  Additional ivf. Po fluids.  Pt feels improved.  Given onset symptoms 5 days ago, and already rx/taken tamiflu, no additional benefit from re-rx now.  Recheck pt, breathing comfortably and drinking po fluids. No wheezing.  bp normal, hr 84, rr 16. Pulse ox currently 95% ra.   Pt feels ready for d/c.     Mirna Mires, MD 05/22/13 2131

## 2013-05-22 NOTE — Discharge Instructions (Signed)
Take antibiotic (levaquin) as prescribed. You may use albuterol inhaler as need if wheezing.  Follow up with your doctor this Monday for recheck. Return to ER right away if worse, trouble breathing, weak/faint, persistent vomiting, other concern.   Pneumonia, Adult Pneumonia is an infection of the lungs.  CAUSES Pneumonia may be caused by bacteria or a virus. Usually, these infections are caused by breathing infectious particles into the lungs (respiratory tract). SYMPTOMS   Cough.  Fever.  Chest pain.  Increased rate of breathing.  Wheezing.  Mucus production. DIAGNOSIS  If you have the common symptoms of pneumonia, your caregiver will typically confirm the diagnosis with a chest X-ray. The X-ray will show an abnormality in the lung (pulmonary infiltrate) if you have pneumonia. Other tests of your blood, urine, or sputum may be done to find the specific cause of your pneumonia. Your caregiver may also do tests (blood gases or pulse oximetry) to see how well your lungs are working. TREATMENT  Some forms of pneumonia may be spread to other people when you cough or sneeze. You may be asked to wear a mask before and during your exam. Pneumonia that is caused by bacteria is treated with antibiotic medicine. Pneumonia that is caused by the influenza virus may be treated with an antiviral medicine. Most other viral infections must run their course. These infections will not respond to antibiotics.  PREVENTION A pneumococcal shot (vaccine) is available to prevent a common bacterial cause of pneumonia. This is usually suggested for:  People over 5 years old.  Patients on chemotherapy.  People with chronic lung problems, such as bronchitis or emphysema.  People with immune system problems. If you are over 65 or have a high risk condition, you may receive the pneumococcal vaccine if you have not received it before. In some countries, a routine influenza vaccine is also recommended. This  vaccine can help prevent some cases of pneumonia.You may be offered the influenza vaccine as part of your care. If you smoke, it is time to quit. You may receive instructions on how to stop smoking. Your caregiver can provide medicines and counseling to help you quit. HOME CARE INSTRUCTIONS   Cough suppressants may be used if you are losing too much rest. However, coughing protects you by clearing your lungs. You should avoid using cough suppressants if you can.  Your caregiver may have prescribed medicine if he or she thinks your pneumonia is caused by a bacteria or influenza. Finish your medicine even if you start to feel better.  Your caregiver may also prescribe an expectorant. This loosens the mucus to be coughed up.  Only take over-the-counter or prescription medicines for pain, discomfort, or fever as directed by your caregiver.  Do not smoke. Smoking is a common cause of bronchitis and can contribute to pneumonia. If you are a smoker and continue to smoke, your cough may last several weeks after your pneumonia has cleared.  A cold steam vaporizer or humidifier in your room or home may help loosen mucus.  Coughing is often worse at night. Sleeping in a semi-upright position in a recliner or using a couple pillows under your head will help with this.  Get rest as you feel it is needed. Your body will usually let you know when you need to rest. SEEK IMMEDIATE MEDICAL CARE IF:   Your illness becomes worse. This is especially true if you are elderly or weakened from any other disease.  You cannot control your cough with suppressants  and are losing sleep.  You begin coughing up blood.  You develop pain which is getting worse or is uncontrolled with medicines.  You have a fever.  Any of the symptoms which initially brought you in for treatment are getting worse rather than better.  You develop shortness of breath or chest pain. MAKE SURE YOU:   Understand these  instructions.  Will watch your condition.  Will get help right away if you are not doing well or get worse. Document Released: 04/29/2005 Document Revised: 07/22/2011 Document Reviewed: 07/19/2010 [facility] Patient Information 2014 [place], [place].

## 2013-05-23 LAB — RAPID INFLUENZA A&B ANTIGENS

## 2013-05-26 ENCOUNTER — Telehealth: Payer: Self-pay | Admitting: Internal Medicine

## 2013-05-26 DIAGNOSIS — R918 Other nonspecific abnormal finding of lung field: Secondary | ICD-10-CM

## 2013-05-26 NOTE — Telephone Encounter (Signed)
Pt request phone call from Dr. Linda Hedges concern about the symptoms of pneumonia that she has (diarrhea). Pt is not sure what to do. Please advise.

## 2013-05-27 NOTE — Telephone Encounter (Signed)
Called patient - diarrhea resolved. She was seen ED 05/1013 had no fever but did have bibasilar infiltrates on CXR. Was started on Levaquin 500 mg q d x7. She is weak but better, no fever, sputum is clear.  Plan OV Monday (on schedule) will come in early for CXR

## 2013-05-29 ENCOUNTER — Emergency Department (HOSPITAL_COMMUNITY): Payer: Medicare Other

## 2013-05-29 ENCOUNTER — Encounter (HOSPITAL_COMMUNITY): Payer: Self-pay | Admitting: Emergency Medicine

## 2013-05-29 ENCOUNTER — Observation Stay (HOSPITAL_COMMUNITY): Payer: Medicare Other

## 2013-05-29 ENCOUNTER — Observation Stay (HOSPITAL_BASED_OUTPATIENT_CLINIC_OR_DEPARTMENT_OTHER)
Admission: EM | Admit: 2013-05-29 | Discharge: 2013-06-01 | Disposition: A | Discharge status: Home / Self Care | Payer: Medicare Other | Source: Home / Self Care | Attending: Emergency Medicine | Admitting: Emergency Medicine

## 2013-05-29 DIAGNOSIS — Z8249 Family history of ischemic heart disease and other diseases of the circulatory system: Secondary | ICD-10-CM

## 2013-05-29 DIAGNOSIS — M549 Dorsalgia, unspecified: Secondary | ICD-10-CM | POA: Diagnosis not present

## 2013-05-29 DIAGNOSIS — Z7982 Long term (current) use of aspirin: Secondary | ICD-10-CM

## 2013-05-29 DIAGNOSIS — R112 Nausea with vomiting, unspecified: Secondary | ICD-10-CM

## 2013-05-29 DIAGNOSIS — R1110 Vomiting, unspecified: Secondary | ICD-10-CM

## 2013-05-29 DIAGNOSIS — R059 Cough, unspecified: Secondary | ICD-10-CM

## 2013-05-29 DIAGNOSIS — K219 Gastro-esophageal reflux disease without esophagitis: Secondary | ICD-10-CM | POA: Insufficient documentation

## 2013-05-29 DIAGNOSIS — Z79899 Other long term (current) drug therapy: Secondary | ICD-10-CM

## 2013-05-29 DIAGNOSIS — K5900 Constipation, unspecified: Secondary | ICD-10-CM | POA: Diagnosis not present

## 2013-05-29 DIAGNOSIS — J9801 Acute bronchospasm: Secondary | ICD-10-CM

## 2013-05-29 DIAGNOSIS — Z85820 Personal history of malignant melanoma of skin: Secondary | ICD-10-CM

## 2013-05-29 DIAGNOSIS — K269 Duodenal ulcer, unspecified as acute or chronic, without hemorrhage or perforation: Principal | ICD-10-CM | POA: Diagnosis present

## 2013-05-29 DIAGNOSIS — R197 Diarrhea, unspecified: Secondary | ICD-10-CM

## 2013-05-29 DIAGNOSIS — K209 Esophagitis, unspecified without bleeding: Secondary | ICD-10-CM | POA: Diagnosis present

## 2013-05-29 DIAGNOSIS — E43 Unspecified severe protein-calorie malnutrition: Secondary | ICD-10-CM | POA: Diagnosis present

## 2013-05-29 DIAGNOSIS — J90 Pleural effusion, not elsewhere classified: Secondary | ICD-10-CM | POA: Diagnosis not present

## 2013-05-29 DIAGNOSIS — K5289 Other specified noninfective gastroenteritis and colitis: Secondary | ICD-10-CM

## 2013-05-29 DIAGNOSIS — E860 Dehydration: Secondary | ICD-10-CM | POA: Diagnosis present

## 2013-05-29 DIAGNOSIS — G4730 Sleep apnea, unspecified: Secondary | ICD-10-CM

## 2013-05-29 DIAGNOSIS — D649 Anemia, unspecified: Secondary | ICD-10-CM | POA: Diagnosis not present

## 2013-05-29 DIAGNOSIS — Z833 Family history of diabetes mellitus: Secondary | ICD-10-CM

## 2013-05-29 DIAGNOSIS — R05 Cough: Secondary | ICD-10-CM

## 2013-05-29 DIAGNOSIS — Z803 Family history of malignant neoplasm of breast: Secondary | ICD-10-CM

## 2013-05-29 DIAGNOSIS — Q619 Cystic kidney disease, unspecified: Secondary | ICD-10-CM

## 2013-05-29 DIAGNOSIS — R079 Chest pain, unspecified: Secondary | ICD-10-CM

## 2013-05-29 DIAGNOSIS — IMO0002 Reserved for concepts with insufficient information to code with codable children: Secondary | ICD-10-CM

## 2013-05-29 DIAGNOSIS — K529 Noninfective gastroenteritis and colitis, unspecified: Secondary | ICD-10-CM | POA: Diagnosis present

## 2013-05-29 LAB — CBC WITH DIFFERENTIAL/PLATELET
Basophils Absolute: 0 10*3/uL (ref 0.0–0.1)
Basophils Relative: 0 % (ref 0–1)
Eosinophils Absolute: 0.1 10*3/uL (ref 0.0–0.7)
Eosinophils Relative: 1 % (ref 0–5)
HCT: 36.4 % (ref 36.0–46.0)
Hemoglobin: 12.6 g/dL (ref 12.0–15.0)
Lymphocytes Relative: 11 % — ABNORMAL LOW (ref 12–46)
Lymphs Abs: 1.3 10*3/uL (ref 0.7–4.0)
MCH: 31.4 pg (ref 26.0–34.0)
MCHC: 34.6 g/dL (ref 30.0–36.0)
MCV: 90.8 fL (ref 78.0–100.0)
Monocytes Absolute: 0.7 10*3/uL (ref 0.1–1.0)
Monocytes Relative: 6 % (ref 3–12)
Neutro Abs: 10.4 10*3/uL — ABNORMAL HIGH (ref 1.7–7.7)
Neutrophils Relative %: 83 % — ABNORMAL HIGH (ref 43–77)
Platelets: 415 10*3/uL — ABNORMAL HIGH (ref 150–400)
RBC: 4.01 MIL/uL (ref 3.87–5.11)
RDW: 13 % (ref 11.5–15.5)
WBC: 12.5 10*3/uL — ABNORMAL HIGH (ref 4.0–10.5)

## 2013-05-29 LAB — COMPREHENSIVE METABOLIC PANEL
ALT: 20 U/L (ref 0–35)
AST: 31 U/L (ref 0–37)
Albumin: 3.2 g/dL — ABNORMAL LOW (ref 3.5–5.2)
Alkaline Phosphatase: 96 U/L (ref 39–117)
BUN: 11 mg/dL (ref 6–23)
CO2: 22 mEq/L (ref 19–32)
Calcium: 8.9 mg/dL (ref 8.4–10.5)
Chloride: 93 mEq/L — ABNORMAL LOW (ref 96–112)
Creatinine, Ser: 0.6 mg/dL (ref 0.50–1.10)
GFR calc Af Amer: >90 mL/min (ref >90)
GFR calc non Af Amer: 88 mL/min — ABNORMAL LOW (ref >90)
Glucose, Bld: 115 mg/dL — ABNORMAL HIGH (ref 70–99)
Potassium: 4.4 mEq/L (ref 3.7–5.3)
Sodium: 134 mEq/L — ABNORMAL LOW (ref 137–147)
Total Bilirubin: 0.5 mg/dL (ref 0.3–1.2)
Total Protein: 7.5 g/dL (ref 6.0–8.3)

## 2013-05-29 LAB — URINALYSIS, ROUTINE W REFLEX MICROSCOPIC
Bilirubin Urine: NEGATIVE
Glucose, UA: NEGATIVE mg/dL
Ketones, ur: 40 mg/dL — AB
Leukocytes, UA: NEGATIVE
Nitrite: NEGATIVE
Protein, ur: NEGATIVE mg/dL
Specific Gravity, Urine: 1.008 (ref 1.005–1.030)
Urobilinogen, UA: 0.2 mg/dL (ref 0.0–1.0)
pH: 6 (ref 5.0–8.0)

## 2013-05-29 LAB — URINE MICROSCOPIC-ADD ON

## 2013-05-29 LAB — MAGNESIUM: Magnesium: 2.1 mg/dL (ref 1.5–2.5)

## 2013-05-29 LAB — CLOSTRIDIUM DIFFICILE BY PCR: Toxigenic C. Difficile by PCR: NEGATIVE

## 2013-05-29 LAB — LACTIC ACID, PLASMA: Lactic Acid, Venous: 0.7 mmol/L (ref 0.5–2.2)

## 2013-05-29 LAB — POTASSIUM: Potassium: 4.2 mEq/L (ref 3.7–5.3)

## 2013-05-29 MED ORDER — SODIUM CHLORIDE 0.9 % IV BOLUS (SEPSIS)
1000.0000 mL | Freq: Once | INTRAVENOUS | Status: AC
  Administered 2013-05-29: 1000 mL via INTRAVENOUS

## 2013-05-29 MED ORDER — GUAIFENESIN-DM 100-10 MG/5ML PO SYRP
5.0000 mL | ORAL_SOLUTION | ORAL | Status: DC | PRN
  Administered 2013-05-29: 5 mL via ORAL
  Filled 2013-05-29: qty 10

## 2013-05-29 MED ORDER — ONDANSETRON HCL 4 MG/2ML IJ SOLN
4.0000 mg | Freq: Four times a day (QID) | INTRAMUSCULAR | Status: DC | PRN
  Administered 2013-05-29: 4 mg via INTRAVENOUS
  Filled 2013-05-29: qty 2

## 2013-05-29 MED ORDER — ENOXAPARIN SODIUM 40 MG/0.4ML ~~LOC~~ SOLN
40.0000 mg | SUBCUTANEOUS | Status: DC
  Administered 2013-05-29 – 2013-05-31 (×3): 40 mg via SUBCUTANEOUS
  Filled 2013-05-29 (×4): qty 0.4

## 2013-05-29 MED ORDER — SODIUM CHLORIDE 0.9 % IV SOLN
INTRAVENOUS | Status: DC
  Administered 2013-05-29: 10:00:00 via INTRAVENOUS

## 2013-05-29 MED ORDER — LIP MEDEX EX OINT
TOPICAL_OINTMENT | CUTANEOUS | Status: DC | PRN
  Filled 2013-05-29: qty 7

## 2013-05-29 MED ORDER — ACETAMINOPHEN 650 MG RE SUPP
650.0000 mg | Freq: Four times a day (QID) | RECTAL | Status: DC | PRN
Start: 2013-05-29 — End: 2013-06-01

## 2013-05-29 MED ORDER — HYDROMORPHONE HCL PF 1 MG/ML IJ SOLN
0.5000 mg | Freq: Once | INTRAMUSCULAR | Status: AC
  Administered 2013-05-29: 0.5 mg via INTRAVENOUS
  Filled 2013-05-29: qty 1

## 2013-05-29 MED ORDER — FAMOTIDINE IN NACL 20-0.9 MG/50ML-% IV SOLN
20.0000 mg | Freq: Two times a day (BID) | INTRAVENOUS | Status: DC
  Administered 2013-05-29 – 2013-05-31 (×6): 20 mg via INTRAVENOUS
  Filled 2013-05-29 (×10): qty 50

## 2013-05-29 MED ORDER — ONDANSETRON HCL 4 MG PO TABS: 4.0000 mg | ORAL_TABLET | Freq: Four times a day (QID) | ORAL | Status: DC | PRN

## 2013-05-29 MED ORDER — ONDANSETRON HCL 4 MG/2ML IJ SOLN
4.0000 mg | Freq: Once | INTRAMUSCULAR | Status: AC
  Administered 2013-05-29: 4 mg via INTRAVENOUS
  Filled 2013-05-29: qty 2

## 2013-05-29 MED ORDER — ONDANSETRON HCL 4 MG/2ML IJ SOLN: 4.0000 mg | Freq: Three times a day (TID) | INTRAMUSCULAR | Status: DC | PRN

## 2013-05-29 MED ORDER — SODIUM CHLORIDE 0.9 % IV SOLN
INTRAVENOUS | Status: DC
  Administered 2013-05-29 – 2013-05-30 (×2): via INTRAVENOUS

## 2013-05-29 MED ORDER — IPRATROPIUM-ALBUTEROL 0.5-2.5 (3) MG/3ML IN SOLN
3.0000 mL | Freq: Four times a day (QID) | RESPIRATORY_TRACT | Status: DC
  Administered 2013-05-29 (×2): 3 mL via RESPIRATORY_TRACT
  Filled 2013-05-29 (×2): qty 3

## 2013-05-29 MED ORDER — IPRATROPIUM-ALBUTEROL 0.5-2.5 (3) MG/3ML IN SOLN
3.0000 mL | Freq: Four times a day (QID) | RESPIRATORY_TRACT | Status: DC
  Administered 2013-05-30 – 2013-06-01 (×9): 3 mL via RESPIRATORY_TRACT
  Filled 2013-05-29 (×9): qty 3

## 2013-05-29 MED ORDER — SACCHAROMYCES BOULARDII 250 MG PO CAPS
250.0000 mg | ORAL_CAPSULE | Freq: Two times a day (BID) | ORAL | Status: DC
  Administered 2013-05-29 – 2013-06-01 (×7): 250 mg via ORAL
  Filled 2013-05-29 (×9): qty 1

## 2013-05-29 MED ORDER — ACYCLOVIR 5 % EX OINT
TOPICAL_OINTMENT | CUTANEOUS | Status: DC
  Administered 2013-05-29 – 2013-05-30 (×8): via TOPICAL
  Administered 2013-05-30: 1 via TOPICAL
  Administered 2013-05-30 – 2013-05-31 (×10): via TOPICAL
  Administered 2013-06-01: 1 via TOPICAL
  Administered 2013-06-01: 10:00:00 via TOPICAL
  Administered 2013-06-01: 1 via TOPICAL
  Filled 2013-05-29 (×2): qty 30

## 2013-05-29 MED ORDER — ACETAMINOPHEN 325 MG PO TABS: 650.0000 mg | ORAL_TABLET | Freq: Four times a day (QID) | ORAL | Status: DC | PRN

## 2013-05-29 NOTE — ED Notes (Signed)
Pt arrived to the ED with a complaint of emesis and diarrhea.  Pt was seen last week and was dx with pneumonia and prescribed antibiotic.  Pt has not felt better in a week.  Pt states in the last two days she has had emesis and diarrhea.  Pt states she has had one episode of emesis and three of diarrhea in the last 24 hours

## 2013-05-29 NOTE — ED Notes (Signed)
Patient transported to X-ray 

## 2013-05-29 NOTE — ED Provider Notes (Signed)
CSN: [id number]     Arrival date & time 05/29/13  0042 History   First MD Initiated Contact with Patient 05/29/13 [phone]     Chief Complaint  Patient presents with  . Emesis  . Diarrhea   (Consider location/radiation/quality/duration/timing/severity/associated sxs/prior Treatment) HPI Comments: 73 yo female with gerd, htn presents with cough, vomiting, diarrhea worsening since Monday.  Pt had the flu sxs two weeks prior then last week was diagnosed with CAP, place on abx and is currently finishing levaquin however pt cannot keep fluids down, vomiting and diarrhea, non bloody.  Abd cramping with sxs.  No known C diff hx.    Patient is a 73 y.o. female presenting with vomiting and diarrhea. The history is provided by the patient.  Emesis Associated symptoms: diarrhea   Associated symptoms: no abdominal pain, no chills and no headaches   Diarrhea Associated symptoms: vomiting   Associated symptoms: no abdominal pain, no chills, no fever and no headaches     Past Medical History  Diagnosis Date  . Diverticulitis   . GERD (gastroesophageal reflux disease)   . Atrophic vaginitis   . MVP (mitral valve prolapse)   . Vertigo   . Osteopenia   . Eustachian tube dysfunction   . Anemia     in childhood  . Hiatal hernia   . Sleep apnea   . Dyspareunia   . Diverticulosis   . Hx of adenomatous colonic polyps   . Schatzki's ring 1995  . Allergic rhinitis    Past Surgical History  Procedure Laterality Date  . Appendectomy  1989  . Abdominal hysterectomy      with anterior and posterior vaginal repair  . Tonsillectomy    . Breast biopsy      RIGHT  . Septoplasty    . Cervical discectomy      C3-4, C5-6, C6-7  . Laparoscopy  04/2000    with lysis of adhesions  . Shoulder surgery      left  . Hand surgery      right  . Melanoma excision      right thigh   Family History  Problem Relation Age of Onset  . Heart attack Father     after years of angina  . Coronary artery disease  Father   . Coronary artery disease Mother   . Breast cancer Maternal Aunt   . Cancer Maternal Aunt     breast, colon, ovarian  . Diabetes Brother   . Coronary artery disease Brother   . Hypertension Sister   . Hyperthyroidism Sister   . Coronary artery disease Brother     PCI-stents   History  Substance Use Topics  . Smoking status: Never Smoker   . Smokeless tobacco: Never Used  . Alcohol Use: No   OB History   Grav Para Term Preterm Abortions TAB SAB Ect Mult Living                 Review of Systems  Constitutional: Positive for appetite change and fatigue. Negative for fever and chills.  HENT: Negative for congestion.   Eyes: Negative for visual disturbance.  Respiratory: Positive for cough. Negative for shortness of breath.   Cardiovascular: Negative for chest pain.  Gastrointestinal: Positive for vomiting and diarrhea. Negative for abdominal pain and blood in stool.  Genitourinary: Negative for dysuria and flank pain.  Musculoskeletal: Negative for back pain, neck pain and neck stiffness.  Skin: Negative for rash.  Neurological: Negative for light-headedness and headaches.  Allergies  Codeine  Home Medications   Current Outpatient Rx  Name  Route  Sig  Dispense  Refill  . acetaminophen (TYLENOL) 500 MG tablet   Oral   Take 500-1,000 mg by mouth every 6 (six) hours as needed for mild pain or headache.         . albuterol (PROVENTIL HFA;VENTOLIN HFA) 108 (90 BASE) MCG/ACT inhaler   Inhalation   Inhale 2 puffs into the lungs every 4 (four) hours as needed for wheezing or shortness of breath.   1 Inhaler   0   . aspirin 81 MG tablet   Oral   Take 81 mg by mouth daily.           . Calcium Carbonate (CALTRATE 600) 1500 MG TABS   Oral   Take 1 tablet by mouth 2 (two) times daily. chewables         . cyanocobalamin 500 MCG tablet   Oral   Take 500 mcg by mouth every morning.         . famotidine (PEPCID) 40 MG tablet   Oral   Take 40 mg by  mouth every morning.         Marland Kitchen levofloxacin (LEVAQUIN) 25 MG/ML solution   Oral   Take 20 mLs (500 mg total) by mouth daily.   200 mL   0   . Multiple Vitamins-Minerals (MULTIVITAMIN WITH MINERALS) tablet   Oral   Take 1 tablet by mouth every morning. chewables         . omega-3 acid ethyl esters (LOVAZA) 1 G capsule   Oral   Take 1 g by mouth every morning.         . vitamin D, CHOLECALCIFEROL, 400 UNITS tablet   Oral   Take 400 Units by mouth every morning.          . vitamin E 400 UNIT capsule   Oral   Take 400 Units by mouth every morning.          Marland Kitchen oseltamivir (TAMIFLU) 75 MG capsule   Oral   Take 75 mg by mouth 2 (two) times daily.         . predniSONE (DELTASONE) 10 MG tablet   Oral   Take 10 mg by mouth daily.         . promethazine (PHENERGAN) 12.5 MG tablet   Oral   Take 1 tablet (12.5 mg total) by mouth every 6 (six) hours as needed for nausea or vomiting.   30 tablet   0    BP 177/60  Pulse 100  Temp(Src) 98.1 F (36.7 C) (Oral)  Resp 18  SpO2 97% Physical Exam  Nursing note and vitals reviewed. Constitutional: She is oriented to person, place, and time. She appears well-developed and well-nourished.  HENT:  Head: Normocephalic and atraumatic.  Dry mm  Eyes: Conjunctivae are normal. Right eye exhibits no discharge. Left eye exhibits no discharge.  Neck: Normal range of motion. Neck supple. No tracheal deviation present.  Cardiovascular: Regular rhythm.  Tachycardia present.   Pulmonary/Chest: Effort normal. She has rales (bilateral lower).  Abdominal: Soft. She exhibits no distension. There is tenderness (mild left upper). There is no guarding.  Musculoskeletal: She exhibits no edema.  Neurological: She is alert and oriented to person, place, and time.  Skin: Skin is warm. No rash noted.  Psychiatric: She has a normal mood and affect.    ED Course  Procedures (including critical care time) Labs Review  Labs Reviewed  CBC WITH  DIFFERENTIAL - Abnormal; Notable for the following:    WBC 12.5 (*)    Platelets 415 (*)    Neutrophils Relative % 83 (*)    Neutro Abs 10.4 (*)    Lymphocytes Relative 11 (*)    All other components within normal limits  COMPREHENSIVE METABOLIC PANEL - Abnormal; Notable for the following:    Sodium 134 (*)    Chloride 93 (*)    Glucose, Bld 115 (*)    Albumin 3.2 (*)    GFR calc non Af Amer 88 (*)    All other components within normal limits  CLOSTRIDIUM DIFFICILE BY PCR  URINALYSIS, ROUTINE W REFLEX MICROSCOPIC  LACTIC ACID, PLASMA  POTASSIUM  MAGNESIUM   Imaging Review Dg Chest 2 View  05/29/2013   CLINICAL DATA:  Chest discomfort. Vomiting. Diarrhea. Recent pneumonia.  EXAM: CHEST  2 VIEW  COMPARISON:  None.  FINDINGS: Improving bibasilar infiltrates seen since previous study. New tiny left pleural effusion noted. No new or worsening areas of infiltrate seen. Heart size is normal.  IMPRESSION: Improving bibasilar infiltrates.  New tiny left pleural effusion.   Electronically Signed   By: Earle Gell M.D.   On: 05/29/2013 07:28    EKG Interpretation   None       MDM   1. Vomiting   2. Diarrhea   3. Cough   4. Dehydration    Differential worsening pneumonia, SE from medicine, C diff, GE, dehydration, other.  Delay with pt waiting in the waiting room for 5 hrs, pt had diarrhea however no sample obtained.  Pt unable to urinate after first bolus, feels improved, second bolus given.  Discussed if pt feels improved possible outpt fup as labs only mild changes, pt does not feel comfortable with that plan  As she has not kept fluid in for 4-5 days.   UA and C diff pending.  Paged [facility] to discuss observation given age, recurrent sxs. Dr Rama agreed with observation. K resent due to hemolysis  The patients results and plan were reviewed and discussed.   Any x-rays performed were personally reviewed by myself.   Differential diagnosis were considered with the presenting  HPI. Filed Vitals:   05/29/13 0104 05/29/13 0811  BP: 139/64 177/60  Pulse: 116 100  Temp: 98.1 F (36.7 C)   TempSrc: Oral   Resp: 18 18  SpO2: 93% 97%    Admission/ observation were discussed with the admitting physician, patient and/or family and they are comfortable with the plan.        Mariea Clonts, MD 05/29/13 [phone]

## 2013-05-29 NOTE — ED Notes (Signed)
Pt states she has been sick for over a week and for the past two days she has had loose bowel movements , pt also states she has vomited once.

## 2013-05-29 NOTE — H&P (Signed)
[facility] History and Physical  Karla Roman YPP:[id number] DOB: 09/21/1940 DOA: 05/29/2013  Referring physician: Dr. Elnora Morrison PCP: Adella Hare, MD   Chief Complaint: Cough, vomiting, diarrhea   History of Present Illness: Karla Roman is an 73 y.o. female with a PMH of 2 weeks of flu like symptoms (treated with Tamiflu, but stopped this medication after 2-3 days secondary to nausea and diarrhea).  Saw Dr. Linda Hedges on 05/20/13 who recommended she finish the Tamiflu and put her on Zofran for nausea, subsequently diagnosed CAP after being evaluated in the ED on 05/22/13 for which she was treated with Levaquin for 1 week (on day #6) but now presents with ongoing intermittent nausea, vomiting and non-bloody diarrhea associated with abdominal cramping.   She reports 6 episodes of loose stools over the past 24 hours, and 2 episodes of vomiting. Her stools have been dark in color but reports she has recently taken Pepto-Bismol with no relief of symptoms. No hematemesis. No current fever or chills but had some at the onset of her illness.  She continues to have a cough, chest tightness, but no dyspnea.  Upon initial evaluation in the ED, the patient's chest x-ray shows improvement from the one done on 05/22/13.  Review of Systems: Constitutional: No fever, no chills;  Appetite diminished; + weight loss, no weight gain, + fatigue.  HEENT: No blurry vision, no diplopia, no pharyngitis, no dysphagia, + dry throat/bad taste in mouth CV: No chest pain but some heaviness in the chest, + palpitations described as "my heart was beating fast", no PND.  Resp: No SOB, + cough productive of thick clear mucous, no pleuritic pain. GI: + nausea, + vomiting, + diarrhea, no melena, no hematochezia, no constipation.  GU: No dysuria, no hematuria, no frequency, no urgency. MSK: no myalgias, + back arthralgias.  Neuro:  No headache, no focal neurological deficits, no history of seizures.  Psych: No depression,  + anxiety.  Endo: No heat intolerance, no cold intolerance, no polyuria, no polydipsia  Skin: No rashes, no skin lesions.  Heme: No easy bruising.  Travel history: None in past 6 months.  Past Medical History Past Medical History  Diagnosis Date  . Diverticulitis   . GERD (gastroesophageal reflux disease)   . Atrophic vaginitis   . MVP (mitral valve prolapse)   . Vertigo   . Osteopenia   . Eustachian tube dysfunction   . Anemia     in childhood  . Hiatal hernia   . Sleep apnea   . Dyspareunia   . Diverticulosis   . Hx of adenomatous colonic polyps   . Schatzki's ring 1995  . Allergic rhinitis      Past Surgical History Past Surgical History  Procedure Laterality Date  . Appendectomy  1989  . Abdominal hysterectomy      with anterior and posterior vaginal repair  . Tonsillectomy    . Breast biopsy      RIGHT  . Septoplasty    . Cervical discectomy      C3-4, C5-6, C6-7  . Laparoscopy  04/2000    with lysis of adhesions  . Shoulder surgery      left  . Hand surgery      right  . Melanoma excision      right thigh     Social History: History   Social History  . Marital Status: Married    Spouse Name: Karla Roman    Number of Children: 2  . Years  of Education: 12   Occupational History  . Retired from [employer]  . Smoking status: Never Smoker   . Smokeless tobacco: Never Used  . Alcohol Use: No  . Drug Use: No  . Sexual Activity: Not Currently   Other Topics Concern  . Not on file   Social History Narrative   HSG. Married 1959. 1 son - ''54; 1 dtr - '80; 6 grandchildren (1 set of twins). Marriage is in good health - SO with rectal cancer. Work - retired.     Family History:  Family History  Problem Relation Age of Onset  . Heart attack Father     after years of angina  . Coronary artery disease Father   . Coronary artery disease Mother   . Breast cancer Maternal Aunt   . Cancer Maternal Aunt     breast,  colon, ovarian  . Diabetes Brother   . Coronary artery disease Brother   . Hypertension Sister   . Hyperthyroidism Sister   . Coronary artery disease Brother     PCI-stents    Allergies: Codeine  Meds: Prior to Admission medications   Medication Sig Start Date End Date Taking? Authorizing Provider  acetaminophen (TYLENOL) 500 MG tablet Take 500-1,000 mg by mouth every 6 (six) hours as needed for mild pain or headache.   Yes Historical Provider, MD  albuterol (PROVENTIL HFA;VENTOLIN HFA) 108 (90 BASE) MCG/ACT inhaler Inhale 2 puffs into the lungs every 4 (four) hours as needed for wheezing or shortness of breath. 05/22/13  Yes Mirna Mires, MD  aspirin 81 MG tablet Take 81 mg by mouth daily.     Yes Historical Provider, MD  Calcium Carbonate (CALTRATE 600) 1500 MG TABS Take 1 tablet by mouth 2 (two) times daily. chewables   Yes Historical Provider, MD  cyanocobalamin 500 MCG tablet Take 500 mcg by mouth every morning.   Yes Historical Provider, MD  famotidine (PEPCID) 40 MG tablet Take 40 mg by mouth every morning.   Yes Historical Provider, MD  levofloxacin (LEVAQUIN) 25 MG/ML solution Take 20 mLs (500 mg total) by mouth daily. 05/22/13  Yes Mirna Mires, MD  Multiple Vitamins-Minerals (MULTIVITAMIN WITH MINERALS) tablet Take 1 tablet by mouth every morning. chewables   Yes Historical Provider, MD  omega-3 acid ethyl esters (LOVAZA) 1 G capsule Take 1 g by mouth every morning.   Yes Historical Provider, MD  vitamin D, CHOLECALCIFEROL, 400 UNITS tablet Take 400 Units by mouth every morning.    Yes Historical Provider, MD  vitamin E 400 UNIT capsule Take 400 Units by mouth every morning.    Yes Historical Provider, MD  oseltamivir (TAMIFLU) 75 MG capsule Take 75 mg by mouth 2 (two) times daily.    Historical Provider, MD  predniSONE (DELTASONE) 10 MG tablet Take 10 mg by mouth daily.    Historical Provider, MD  promethazine (PHENERGAN) 12.5 MG tablet Take 1 tablet (12.5 mg total) by mouth  every 6 (six) hours as needed for nausea or vomiting. 05/21/13   Janith Lima, MD    Physical Exam: Filed Vitals:   05/29/13 0104 05/29/13 0811  BP: 139/64 177/60  Pulse: 116 100  Temp: 98.1 F (36.7 C)   TempSrc: Oral   Resp: 18 18  SpO2: 93% 97%     Physical Exam: Blood pressure 177/60, pulse 100, temperature 98.1 F (36.7 C), temperature source Oral, resp. rate 18, SpO2 97.00%. Gen: No acute  distress. Ill-appearing. Head: Normocephalic, atraumatic. Eyes: PERRL, EOMI, sclerae nonicteric. Mouth: Oropharynx with posterior pharyngeal erythema, no thrush. Herpetic sore to upper lip. Neck: Supple, no thyromegaly, no lymphadenopathy, no jugular venous distention. Chest: Lungs coarse with diffuse wheezes and crackles at the bases. CV: Heart sounds are tachycardic, no murmurs, rubs or gallops. Abdomen: Soft, nontender, nondistended with normal active bowel sounds. Extremities: Extremities are without clubbing, edema, or cyanosis. Skin: Warm and dry. Neuro: Alert and oriented times 3; cranial nerves II through XII grossly intact. Psych: Mood and affect flat.  Labs on Admission:  Basic Metabolic Panel:  Recent Labs Lab 05/29/13 0655 05/29/13 0903  NA 134*  --   K 4.4 4.2  CL 93*  --   CO2 22  --   GLUCOSE 115*  --   BUN 11  --   CREATININE 0.60  --   CALCIUM 8.9  --   MG  --  2.1   Liver Function Tests:  Recent Labs Lab 05/29/13 0655  AST 31  ALT 20  ALKPHOS 96  BILITOT 0.5  PROT 7.5  ALBUMIN 3.2*   CBC:  Recent Labs Lab 05/29/13 0655  WBC 12.5*  NEUTROABS 10.4*  HGB 12.6  HCT 36.4  MCV 90.8  PLT 415*    Radiological Exams on Admission: Dg Chest 2 View  05/29/2013   CLINICAL DATA:  Chest discomfort. Vomiting. Diarrhea. Recent pneumonia.  EXAM: CHEST  2 VIEW  COMPARISON:  None.  FINDINGS: Improving bibasilar infiltrates seen since previous study. New tiny left pleural effusion noted. No new or worsening areas of infiltrate seen. Heart size is normal.   IMPRESSION: Improving bibasilar infiltrates.  New tiny left pleural effusion.   Electronically Signed   By: Earle Gell M.D.   On: 05/29/2013 07:28    Assessment/Plan Principal Problem:   Gastroenteritis Rule out Clostridium difficile given recent use of antibiotics. Place on IV Pepcid. Place on probiotic. Active Problems:   GERD Pepcid every 12 hours.   Diarrhea Ruling out Clostridium difficile.   Bronchospasm with recent CAP Duo nebs every 6 hours ordered. May need a trial of prednisone. Chest x-ray looks clearer and she has completed 6 days of therapy with Levaquin, so will hold further antibiotics for now.  Code Status: Full. Family Communication: Husband at bedside. Disposition Plan: Home when stable.  Time spent: 1 hour.  Ascencion Coye [facility] Pager [phone]  If 7PM-7AM, please contact night-coverage [email or web address] Password [facility] 05/29/2013, 9:51 AM

## 2013-05-30 DIAGNOSIS — K5289 Other specified noninfective gastroenteritis and colitis: Secondary | ICD-10-CM

## 2013-05-30 LAB — BASIC METABOLIC PANEL
BUN: 5 mg/dL — ABNORMAL LOW (ref 6–23)
CO2: 24 mEq/L (ref 19–32)
Calcium: 8.1 mg/dL — ABNORMAL LOW (ref 8.4–10.5)
Chloride: 105 mEq/L (ref 96–112)
Creatinine, Ser: 0.58 mg/dL (ref 0.50–1.10)
GFR calc Af Amer: >90 mL/min (ref >90)
GFR calc non Af Amer: 89 mL/min — ABNORMAL LOW (ref >90)
Glucose, Bld: 95 mg/dL (ref 70–99)
Potassium: 3.7 mEq/L (ref 3.7–5.3)
Sodium: 140 mEq/L (ref 137–147)

## 2013-05-30 LAB — CBC
HCT: 32 % — ABNORMAL LOW (ref 36.0–46.0)
Hemoglobin: 10.4 g/dL — ABNORMAL LOW (ref 12.0–15.0)
MCH: 30.1 pg (ref 26.0–34.0)
MCHC: 32.5 g/dL (ref 30.0–36.0)
MCV: 92.8 fL (ref 78.0–100.0)
Platelets: 325 10*3/uL (ref 150–400)
RBC: 3.45 MIL/uL — ABNORMAL LOW (ref 3.87–5.11)
RDW: 13.3 % (ref 11.5–15.5)
WBC: 5.6 10*3/uL (ref 4.0–10.5)

## 2013-05-30 MED ORDER — ENSURE COMPLETE PO LIQD
237.0000 mL | Freq: Two times a day (BID) | ORAL | Status: DC
  Administered 2013-05-30 – 2013-06-01 (×4): 237 mL via ORAL

## 2013-05-30 NOTE — Progress Notes (Signed)
[facility] PROGRESS NOTE   SHAMAYA KAUER UKG:[id number] DOB: Sep 20, 1940 DOA: 05/29/2013 PCP: Adella Hare, MD  Brief narrative: Karla Roman is an 73 y.o. female was admitted with nausea, vomiting and diarrhea on 05/29/13 in the setting of with a PMH of 2 weeks of flu like symptoms (treated with Tamiflu, but stopped this medication after 2-3 days secondary to nausea and diarrhea). Saw Dr. Linda Hedges on 05/20/13 who recommended she finish the Tamiflu and put her on Zofran for nausea, subsequently diagnosed CAP after being evaluated in the ED on 05/22/13 for which she was treated with Levaquin for approximately 6 days.   Assessment/Plan: Principal Problem:  Gastroenteritis  KUB negative for obstruction. Clostridium difficile ruled out with PCR. Continue IV Pepcid and probiotics. Symptoms improving.  Advance diet to [place]. Active Problems:  GERD  Continue Pepcid every 12 hours.  Diarrhea  Possibly from a viral gastroenteritis, C. difficile ruled out.  Bronchospasm with recent CAP  Continue Duo nebs every 6 hours. Chest x-ray looks clearer and she has completed 6 days of therapy with Levaquin, so will hold further antibiotics for now.   Code Status: Full.  Family Communication: Husband at bedside on admission, no family present.  Disposition Plan: Home when stable.    IV access:  Peripheral IV  Medical Consultants:  None  Other Consultants:  None  Anti-infectives:  None  HPI/Subjective: [name] feels better.  Still with a cough and some diarrhea.  No nausea or vomiting.  Tolerating clear liquids.  Objective: Filed Vitals:   05/29/13 1939 05/29/13 2209 05/30/13 0136 05/30/13 0515  BP:  134/62  134/62  Pulse:  87  83  Temp:  98.5 F (36.9 C)  98.4 F (36.9 C)  TempSrc:  Oral  Oral  Resp:  18  18  Height:      Weight:      SpO2: 97% 99% 96% 99%    Intake/Output Summary (Last 24 hours) at 05/30/13 0741 Last data filed at 05/29/13 1800  Gross  per 24 hour  Intake   1260 ml  Output      0 ml  Net   1260 ml    Exam: Gen:  NAD Cardiovascular:  RRR, No M/R/G Respiratory:  Lungs course with scattered rhonchi Gastrointestinal:  Abdomen soft, NT/ND, + BS Extremities:  No C/E/C  Data Reviewed: Basic Metabolic Panel:  Recent Labs Lab 05/29/13 0655 05/29/13 0903 05/30/13 0553  NA 134*  --  140  K 4.4 4.2 3.7  CL 93*  --  105  CO2 22  --  24  GLUCOSE 115*  --  95  BUN 11  --  5*  CREATININE 0.60  --  0.58  CALCIUM 8.9  --  8.1*  MG  --  2.1  --    GFR Estimated Creatinine Clearance: 48.8 ml/min (by C-G formula based on Cr of 0.58). Liver Function Tests:  Recent Labs Lab 05/29/13 0655  AST 31  ALT 20  ALKPHOS 96  BILITOT 0.5  PROT 7.5  ALBUMIN 3.2*   CBC:  Recent Labs Lab 05/29/13 0655 05/30/13 0553  WBC 12.5* 5.6  NEUTROABS 10.4*  --   HGB 12.6 10.4*  HCT 36.4 32.0*  MCV 90.8 92.8  PLT 415* 325   Microbiology Recent Results (from the past 240 hour(s))  CLOSTRIDIUM DIFFICILE BY PCR     Status: None   Collection Time    05/29/13  1:00 PM      Result Value  Range Status   C difficile by pcr NEGATIVE  NEGATIVE Final   Comment: Performed at [facility]     Procedures and Diagnostic Studies: Dg Chest 2 View  05/29/2013   CLINICAL DATA:  Chest discomfort. Vomiting. Diarrhea. Recent pneumonia.  EXAM: CHEST  2 VIEW  COMPARISON:  None.  FINDINGS: Improving bibasilar infiltrates seen since previous study. New tiny left pleural effusion noted. No new or worsening areas of infiltrate seen. Heart size is normal.  IMPRESSION: Improving bibasilar infiltrates.  New tiny left pleural effusion.   Electronically Signed   By: Earle Gell M.D.   On: 05/29/2013 07:28   Dg Chest 2 View  05/22/2013   CLINICAL DATA:  Productive cough  EXAM: CHEST  2 VIEW  COMPARISON:  04/01/2011  FINDINGS: Cardiac shadow is within normal limits. The lungs are well aerated bilaterally but demonstrate bibasilar infiltrative  changes. No acute bony abnormality is noted. Previously seen density in the right lung apex is less well visualized on the current exam.  IMPRESSION: Bibasilar infiltrates.   Electronically Signed   By: Inez Catalina M.D.   On: 05/22/2013 18:31   Dg Abd 1 View  05/29/2013   CLINICAL DATA:  Nausea vomiting and diarrhea for 3 days  EXAM: ABDOMEN - 1 VIEW  COMPARISON:  None.  FINDINGS: No abnormally dilated loops of bowel. Bowel gas pattern is unremarkable. Mild aortic calcification noted. No abnormal opacities otherwise.  IMPRESSION: Bowel gas pattern within normal limits   Electronically Signed   By: Skipper Cliche M.D.   On: 05/29/2013 10:22    Scheduled Meds: . acyclovir ointment   Topical Q3H  . enoxaparin (LOVENOX) injection  40 mg Subcutaneous Q24H  . famotidine (PEPCID) IV  20 mg Intravenous Q12H  . ipratropium-albuterol  3 mL Nebulization Q6H WA  . saccharomyces boulardii  250 mg Oral BID   Continuous Infusions: . sodium chloride 75 mL/hr at 05/29/13 1128    Time spent: 25 minutes.   LOS: 1 day   RAMA,CHRISTINA  [facility] Pager [phone].   *Please note that the hospitalists switch teams on Wednesdays. Please call the flow manager at [phone] if you are having difficulty reaching the hospitalist taking care of this patient as she can update you and provide the most up-to-date pager number of provider caring for the patient. If 8PM-8AM, please contact night-coverage at [email or web address], password [facility]  05/30/2013, 7:41 AM

## 2013-05-30 NOTE — Progress Notes (Signed)
INITIAL NUTRITION ASSESSMENT  DOCUMENTATION CODES Per approved criteria  -Not Applicable   INTERVENTION: - Ensure Complete po BID, each supplement provides 350 kcal and 13 grams of protein - Diet advancement per MD discretion NUTRITION DIAGNOSIS: Inadequate oral intake related to nausea, vomiting and diarrhea as evidenced by reported wt loss.   Goal: Pt to meet >/= 90% of their estimated nutrition needs   Monitor:  Wt, po intake, toleration of diet advancement, labs  Reason for Assessment: MST  73 y.o. female  Admitting Dx: Gastroenteritis  ASSESSMENT: Karla Roman is an 73 y.o. female was admitted with nausea, vomiting and diarrhea on 05/29/13 in the setting of with a PMH of 2 weeks of flu like symptoms.  Pt reports that her appetite has been decreased since she has been sick. She has experienced about a 7 lb wt loss in the past two weeks. She had a large amount of diarrhea today, but her nausea and vomiting are no longer present.   Height: Ht Readings from Last 1 Encounters:  05/29/13 5\' 2"  (1.575 m)    Weight: Wt Readings from Last 1 Encounters:  05/29/13 112 lb 9.6 oz (51.075 kg)    Ideal Body Weight: 50.1 kg  % Ideal Body Weight: 102%  Wt Readings from Last 10 Encounters:  05/29/13 112 lb 9.6 oz (51.075 kg)  05/20/13 112 lb (50.803 kg)  03/18/13 118 lb (53.524 kg)  12/23/12 116 lb (52.617 kg)  09/28/12 119 lb (53.978 kg)  09/16/12 116 lb (52.617 kg)  12/23/11 115 lb (52.164 kg)  10/01/11 114 lb 6.4 oz (51.891 kg)  04/01/11 112 lb 8 oz (51.03 kg)  12/13/10 110 lb (49.896 kg)    Usual Body Weight: ~119 lbs  % Usual Body Weight: 94%  BMI:  Body mass index is 20.59 kg/(m^2).  Estimated Nutritional Needs: Kcal: 1300-1500 Protein: 55-65 g Fluid: >1.5 L  Skin: WNL  Diet Order: Full Liquid  EDUCATION NEEDS: -No education needs identified at this time   Intake/Output Summary (Last 24 hours) at 05/30/13 1519 Last data filed at 05/29/13  1800  Gross per 24 hour  Intake    465 ml  Output      0 ml  Net    465 ml    Last BM: none recorded   Labs:   Recent Labs Lab 05/29/13 0655 05/29/13 0903 05/30/13 0553  NA 134*  --  140  K 4.4 4.2 3.7  CL 93*  --  105  CO2 22  --  24  BUN 11  --  5*  CREATININE 0.60  --  0.58  CALCIUM 8.9  --  8.1*  MG  --  2.1  --   GLUCOSE 115*  --  95    CBG (last 3)  No results found for this basename: GLUCAP,  in the last 72 hours  Scheduled Meds: . acyclovir ointment   Topical Q3H  . enoxaparin (LOVENOX) injection  40 mg Subcutaneous Q24H  . famotidine (PEPCID) IV  20 mg Intravenous Q12H  . ipratropium-albuterol  3 mL Nebulization Q6H WA  . saccharomyces boulardii  250 mg Oral BID    Continuous Infusions: . sodium chloride 20 mL/hr at 05/30/13 1429    Past Medical History  Diagnosis Date  . Diverticulitis   . GERD (gastroesophageal reflux disease)   . Atrophic vaginitis   . MVP (mitral valve prolapse)   . Vertigo   . Osteopenia   . Eustachian tube dysfunction   .  Anemia     in childhood  . Hiatal hernia   . Sleep apnea   . Dyspareunia   . Diverticulosis   . Hx of adenomatous colonic polyps   . Schatzki's ring 1995  . Allergic rhinitis     Past Surgical History  Procedure Laterality Date  . Appendectomy  1989  . Abdominal hysterectomy      with anterior and posterior vaginal repair  . Tonsillectomy    . Breast biopsy      RIGHT  . Septoplasty    . Cervical discectomy      C3-4, C5-6, C6-7  . Laparoscopy  04/2000    with lysis of adhesions  . Shoulder surgery      left  . Hand surgery      right  . Melanoma excision      right thigh    Terrace Arabia RD, LDN

## 2013-05-31 ENCOUNTER — Inpatient Hospital Stay: Payer: Medicare Other | Admitting: Internal Medicine

## 2013-05-31 MED ORDER — DEXTROMETHORPHAN POLISTIREX 30 MG/5ML PO LQCR
60.0000 mg | Freq: Two times a day (BID) | ORAL | Status: DC
  Administered 2013-05-31 – 2013-06-01 (×3): 60 mg via ORAL
  Filled 2013-05-31 (×4): qty 10

## 2013-05-31 MED ORDER — DIPHENOXYLATE-ATROPINE 2.5-0.025 MG PO TABS
1.0000 | ORAL_TABLET | Freq: Four times a day (QID) | ORAL | Status: AC
  Administered 2013-05-31 (×4): 1 via ORAL
  Filled 2013-05-31 (×4): qty 1

## 2013-05-31 NOTE — Progress Notes (Signed)
[facility] PROGRESS NOTE   Karla Roman JKK:[id number] DOB: 1940-09-27 DOA: 05/29/2013 PCP: Karla Hare, MD  Brief narrative: Karla Roman is an 73 y.o. female was admitted with nausea, vomiting and diarrhea on 05/29/13 in the setting of with a PMH of 2 weeks of flu like symptoms (treated with Tamiflu, but stopped this medication after 2-3 days secondary to nausea and diarrhea). Saw Karla Roman on 05/20/13 who recommended she finish the Tamiflu and put her on Zofran for nausea, subsequently diagnosed CAP after being evaluated in the ED on 05/22/13 for which she was treated with Levaquin for approximately 6 days.   Assessment/Plan: Principal Problem:  Gastroenteritis  KUB negative for obstruction. Clostridium difficile ruled out with PCR. Continue IV Pepcid and probiotics. Symptoms improving but still with some diarrhea.  Start Lomotil.  Advance diet to regular. Active Problems:  GERD  Continue Pepcid every 12 hours.  Diarrhea  Possibly from a viral gastroenteritis, C. difficile ruled out.  Bronchospasm with recent CAP  Continue Duo nebs every 6 hours. Chest x-ray looks clearer and she has completed 6 days of therapy with Levaquin, so will hold further antibiotics for now. Add Delsym for cough.  Code Status: Full.  Family Communication: Karla Roman at bedside on admission, no family present.  Disposition Plan: Home when stable.    IV access:  Peripheral IV  Medical Consultants:  None  Other Consultants:  None  Anti-infectives:  None  HPI/Subjective: Karla Roman feels better, but still with a cough and some diarrhea.  No nausea or vomiting.  Tolerating full liquids.  Objective: Filed Vitals:   05/30/13 2013 05/30/13 2148 05/31/13 0631 05/31/13 0931  BP:  139/47 127/73   Pulse:  100 90   Temp:  98.8 F (37.1 C) 98 F (36.7 C)   TempSrc:  Oral Oral   Resp:  18 16   Height:      Weight:      SpO2: 98% 97% 96% 99%    Intake/Output Summary  (Last 24 hours) at 05/31/13 1049 Last data filed at 05/31/13 1000  Gross per 24 hour  Intake 2406.59 ml  Output    300 ml  Net 2106.59 ml    Exam: Gen:  NAD Cardiovascular:  RRR, No M/R/G Respiratory:  Lungs course with scattered rhonchi Gastrointestinal:  Abdomen soft, NT/ND, + BS Extremities:  No C/E/C  Data Reviewed: Basic Metabolic Panel:  Recent Labs Lab 05/29/13 0655 05/29/13 0903 05/30/13 0553  NA 134*  --  140  K 4.4 4.2 3.7  CL 93*  --  105  CO2 22  --  24  GLUCOSE 115*  --  95  BUN 11  --  5*  CREATININE 0.60  --  0.58  CALCIUM 8.9  --  8.1*  MG  --  2.1  --    GFR Estimated Creatinine Clearance: 48.8 ml/min (by C-G formula based on Cr of 0.58). Liver Function Tests:  Recent Labs Lab 05/29/13 0655  AST 31  ALT 20  ALKPHOS 96  BILITOT 0.5  PROT 7.5  ALBUMIN 3.2*   CBC:  Recent Labs Lab 05/29/13 0655 05/30/13 0553  WBC 12.5* 5.6  NEUTROABS 10.4*  --   HGB 12.6 10.4*  HCT 36.4 32.0*  MCV 90.8 92.8  PLT 415* 325   Microbiology Recent Results (from the past 240 hour(s))  CLOSTRIDIUM DIFFICILE BY PCR     Status: None   Collection Time    05/29/13  1:00 PM  Result Value Range Status   C difficile by pcr NEGATIVE  NEGATIVE Final   Comment: Performed at [facility]     Procedures and Diagnostic Studies: Dg Chest 2 View  05/29/2013   CLINICAL DATA:  Chest discomfort. Vomiting. Diarrhea. Recent pneumonia.  EXAM: CHEST  2 VIEW  COMPARISON:  None.  FINDINGS: Improving bibasilar infiltrates seen since previous study. New tiny left pleural effusion noted. No new or worsening areas of infiltrate seen. Heart size is normal.  IMPRESSION: Improving bibasilar infiltrates.  New tiny left pleural effusion.   Electronically Signed   By: Earle Gell M.D.   On: 05/29/2013 07:28   Dg Chest 2 View  05/22/2013   CLINICAL DATA:  Productive cough  EXAM: CHEST  2 VIEW  COMPARISON:  04/01/2011  FINDINGS: Cardiac shadow is within normal limits. The  lungs are well aerated bilaterally but demonstrate bibasilar infiltrative changes. No acute bony abnormality is noted. Previously seen density in the right lung apex is less well visualized on the current exam.  IMPRESSION: Bibasilar infiltrates.   Electronically Signed   By: Inez Catalina M.D.   On: 05/22/2013 18:31   Dg Abd 1 View  05/29/2013   CLINICAL DATA:  Nausea vomiting and diarrhea for 3 days  EXAM: ABDOMEN - 1 VIEW  COMPARISON:  None.  FINDINGS: No abnormally dilated loops of bowel. Bowel gas pattern is unremarkable. Mild aortic calcification noted. No abnormal opacities otherwise.  IMPRESSION: Bowel gas pattern within normal limits   Electronically Signed   By: Skipper Cliche M.D.   On: 05/29/2013 10:22    Scheduled Meds: . acyclovir ointment   Topical Q3H  . dextromethorphan  60 mg Oral BID  . diphenoxylate-atropine  1 tablet Oral QID  . enoxaparin (LOVENOX) injection  40 mg Subcutaneous Q24H  . famotidine (PEPCID) IV  20 mg Intravenous Q12H  . feeding supplement (ENSURE COMPLETE)  237 mL Oral BID BM  . ipratropium-albuterol  3 mL Nebulization Q6H WA  . saccharomyces boulardii  250 mg Oral BID   Continuous Infusions: . sodium chloride 20 mL/hr at 05/30/13 1704    Time spent: 25 minutes.   LOS: 2 days   RAMA,CHRISTINA  [facility] Pager [phone].   *Please note that the hospitalists switch teams on Wednesdays. Please call the flow manager at [phone] if you are having difficulty reaching the hospitalist taking care of this patient as she can update you and provide the most up-to-date pager number of provider caring for the patient. If 8PM-8AM, please contact night-coverage at [email or web address], password [facility]  05/31/2013, 10:49 AM

## 2013-06-01 MED ORDER — SACCHAROMYCES BOULARDII 250 MG PO CAPS: 250.0000 mg | ORAL_CAPSULE | Freq: Two times a day (BID) | ORAL | Status: DC

## 2013-06-01 MED ORDER — ACYCLOVIR 5 % EX OINT: TOPICAL_OINTMENT | CUTANEOUS | Status: DC

## 2013-06-01 MED ORDER — DEXTROMETHORPHAN POLISTIREX 30 MG/5ML PO LQCR: 60.0000 mg | Freq: Two times a day (BID) | ORAL | Status: DC

## 2013-06-01 NOTE — Discharge Summary (Signed)
Physician Discharge Summary  Karla Roman [id number] DOB: 09/17/1940 DOA: 05/29/2013  PCP: Adella Hare, MD  Admit date: 05/29/2013 Discharge date: 06/01/2013  Recommendations for Outpatient Follow-up:  1. F/U with PCP in 1 week to ensure resolution of symptoms.  Discharge Diagnoses:  Principal Problem:    Gastroenteritis Active Problems:    GERD    Diarrhea    Bronchospasm with recent CAP   Discharge Condition: Improved.  Diet recommendation: Regular.  History of present illness:  Karla Roman is an 73 y.o. female was admitted with nausea, vomiting and diarrhea on 05/29/13 in the setting of with a PMH of 2 weeks of flu like symptoms (treated with Tamiflu, but stopped this medication after 2-3 days secondary to nausea and diarrhea). Saw Dr. Linda Hedges on 05/20/13 who recommended she finish the Tamiflu and put her on Zofran for nausea, subsequently diagnosed CAP after being evaluated in the ED on 05/22/13 for which she was treated with Levaquin for approximately 6 days.   Hospital Course by problem:  Principal Problem:  Gastroenteritis  KUB done on admission was negative for obstruction. Clostridium difficile ruled out with PCR. Treated conservatively with IV Pepcid and probiotics. Lomotil added after C. Diff confirmed negative.  Symptoms gradually resolved over the course of her hospital stay, and has tolerated advancement of her diet without a return of symptoms.  Active Problems:  GERD  Treated with IV Pepcid every 12 hours. Resume home dose of Pepcid at discharge. Diarrhea  Likely from a viral gastroenteritis, C. difficile ruled out.  Bronchospasm with recent CAP  Treated with Duo nebs every 6 hours. Chest x-ray, done on admission, looked clearer and she has completed 6 days of therapy with Levaquin, so no further antibiotics were given. Continue PRN Delsym for cough.  Procedures:  None.  Consultations:  None.  Discharge Exam: Filed Vitals:   06/01/13  0639  BP: 148/66  Pulse: 84  Temp: 98 F (36.7 C)  Resp: 18   Filed Vitals:   05/31/13 1557 05/31/13 1929 05/31/13 2154 06/01/13 0639  BP: 117/48  152/67 148/66  Pulse: 92  89 84  Temp: 98.5 F (36.9 C)  98.1 F (36.7 C) 98 F (36.7 C)  TempSrc: Oral  Oral Oral  Resp: 18  20 18   Height:      Weight:      SpO2: 97% 93% 95% 96%    Gen:  NAD Cardiovascular:  RRR, No M/R/G Respiratory: Lungs CTAB, diminished Gastrointestinal: Abdomen soft, NT/ND with normal active bowel sounds. Extremities: No C/E/C   Discharge Instructions  Discharge Orders   Future Orders Complete By Expires   Call MD for:  extreme fatigue  As directed    Call MD for:  persistant nausea and vomiting  As directed    Call MD for:  temperature >100.4  As directed    Diet general  As directed    Discharge instructions  As directed    Comments:     You were cared for by Dr. Jacquelynn Cree  (a hospitalist) during your hospital stay. If you have any questions about your discharge medications or the care you received while you were in the hospital after you are discharged, you can call the unit and ask to speak with the hospitalist on call if the hospitalist that took care of you is not available. Once you are discharged, your primary care physician will handle any further medical issues. Please note that NO REFILLS for any discharge medications will be  authorized once you are discharged, as it is imperative that you return to your primary care physician (or establish a relationship with a primary care physician if you do not have one) for your aftercare needs so that they can reassess your need for medications and monitor your lab values.  Any outstanding tests can be reviewed by your PCP at your follow up visit.  It is also important to review any medicine changes with your PCP.  Please bring these d/c instructions with you to your next visit so your physician can review these changes with you.  If you do not have a  primary care physician, you can call [phone] for a physician referral.  It is highly recommended that you obtain a PCP for hospital follow up.   Increase activity slowly  As directed        Medication List    STOP taking these medications       levofloxacin 25 MG/ML solution  Commonly known as:  LEVAQUIN     oseltamivir 75 MG capsule  Commonly known as:  TAMIFLU     predniSONE 10 MG tablet  Commonly known as:  DELTASONE      TAKE these medications       acetaminophen 500 MG tablet  Commonly known as:  TYLENOL  Take 500-1,000 mg by mouth every 6 (six) hours as needed for mild pain or headache.     acyclovir ointment 5 %  Commonly known as:  ZOVIRAX  Apply topically every 3 (three) hours.     albuterol 108 (90 BASE) MCG/ACT inhaler  Commonly known as:  PROVENTIL HFA;VENTOLIN HFA  Inhale 2 puffs into the lungs every 4 (four) hours as needed for wheezing or shortness of breath.     aspirin 81 MG tablet  Take 81 mg by mouth daily.     CALTRATE 600 1500 MG Tabs  Generic drug:  Calcium Carbonate  Take 1 tablet by mouth 2 (two) times daily. chewables     cyanocobalamin 500 MCG tablet  Take 500 mcg by mouth every morning.     dextromethorphan 30 MG/5ML liquid  Commonly known as:  DELSYM  Take 10 mLs (60 mg total) by mouth 2 (two) times daily.     famotidine 40 MG tablet  Commonly known as:  PEPCID  Take 40 mg by mouth every morning.     multivitamin with minerals tablet  Take 1 tablet by mouth every morning. chewables     omega-3 acid ethyl esters 1 G capsule  Commonly known as:  LOVAZA  Take 1 g by mouth every morning.     promethazine 12.5 MG tablet  Commonly known as:  PHENERGAN  Take 1 tablet (12.5 mg total) by mouth every 6 (six) hours as needed for nausea or vomiting.     saccharomyces boulardii 250 MG capsule  Commonly known as:  FLORASTOR  Take 1 capsule (250 mg total) by mouth 2 (two) times daily.     vitamin D (CHOLECALCIFEROL) 400 UNITS tablet   Take 400 Units by mouth every morning.     vitamin E 400 UNIT capsule  Take 400 Units by mouth every morning.           Follow-up Information   Follow up with Adella Hare, MD. Schedule an appointment as soon as possible for a visit in 1 week. [facility] follow up.)    Specialty:  Internal Medicine   Contact information:   [address] [place] [address]  [phone]        The results of significant diagnostics from this hospitalization (including imaging, microbiology, ancillary and laboratory) are listed below for reference.    Significant Diagnostic Studies: Dg Chest 2 View  05/29/2013   CLINICAL DATA:  Chest discomfort. Vomiting. Diarrhea. Recent pneumonia.  EXAM: CHEST  2 VIEW  COMPARISON:  None.  FINDINGS: Improving bibasilar infiltrates seen since previous study. New tiny left pleural effusion noted. No new or worsening areas of infiltrate seen. Heart size is normal.  IMPRESSION: Improving bibasilar infiltrates.  New tiny left pleural effusion.   Electronically Signed   By: Earle Gell M.D.   On: 05/29/2013 07:28   Dg Chest 2 View  05/22/2013   CLINICAL DATA:  Productive cough  EXAM: CHEST  2 VIEW  COMPARISON:  04/01/2011  FINDINGS: Cardiac shadow is within normal limits. The lungs are well aerated bilaterally but demonstrate bibasilar infiltrative changes. No acute bony abnormality is noted. Previously seen density in the right lung apex is less well visualized on the current exam.  IMPRESSION: Bibasilar infiltrates.   Electronically Signed   By: Inez Catalina M.D.   On: 05/22/2013 18:31   Dg Abd 1 View  05/29/2013   CLINICAL DATA:  Nausea vomiting and diarrhea for 3 days  EXAM: ABDOMEN - 1 VIEW  COMPARISON:  None.  FINDINGS: No abnormally dilated loops of bowel. Bowel gas pattern is unremarkable. Mild aortic calcification noted. No abnormal opacities otherwise.  IMPRESSION: Bowel gas pattern within normal limits   Electronically Signed   By: Skipper Cliche M.D.    On: 05/29/2013 10:22    Labs:  Basic Metabolic Panel:  Recent Labs Lab 05/29/13 0655 05/29/13 0903 05/30/13 0553  NA 134*  --  140  K 4.4 4.2 3.7  CL 93*  --  105  CO2 22  --  24  GLUCOSE 115*  --  95  BUN 11  --  5*  CREATININE 0.60  --  0.58  CALCIUM 8.9  --  8.1*  MG  --  2.1  --    GFR Estimated Creatinine Clearance: 48.8 ml/min (by C-G formula based on Cr of 0.58). Liver Function Tests:  Recent Labs Lab 05/29/13 0655  AST 31  ALT 20  ALKPHOS 96  BILITOT 0.5  PROT 7.5  ALBUMIN 3.2*   CBC:  Recent Labs Lab 05/29/13 0655 05/30/13 0553  WBC 12.5* 5.6  NEUTROABS 10.4*  --   HGB 12.6 10.4*  HCT 36.4 32.0*  MCV 90.8 92.8  PLT 415* 325   Microbiology Recent Results (from the past 240 hour(s))  CLOSTRIDIUM DIFFICILE BY PCR     Status: None   Collection Time    05/29/13  1:00 PM      Result Value Range Status   C difficile by pcr NEGATIVE  NEGATIVE Final   Comment: Performed at [facility]    Time coordinating discharge: 25 minutes.  Signed:  Lebron Nauert  Pager [phone] [facility] 06/01/2013, 10:59 AM

## 2013-06-02 ENCOUNTER — Inpatient Hospital Stay (HOSPITAL_COMMUNITY)
Admission: AD | Admit: 2013-06-02 | Discharge: 2013-06-11 | DRG: 380 | Disposition: A | Discharge status: Home / Self Care | Payer: Medicare Other | Source: Ambulatory Visit | Attending: Internal Medicine | Admitting: Internal Medicine

## 2013-06-02 ENCOUNTER — Ambulatory Visit (INDEPENDENT_AMBULATORY_CARE_PROVIDER_SITE_OTHER): Payer: Medicare Other | Admitting: Internal Medicine

## 2013-06-02 ENCOUNTER — Encounter: Payer: Self-pay | Admitting: Internal Medicine

## 2013-06-02 ENCOUNTER — Telehealth: Payer: Self-pay | Admitting: Internal Medicine

## 2013-06-02 ENCOUNTER — Encounter (HOSPITAL_COMMUNITY): Payer: Self-pay | Admitting: General Practice

## 2013-06-02 VITALS — BP 150/70 | HR 97 | Temp 98.2°F | Wt 109.8 lb

## 2013-06-02 DIAGNOSIS — R0789 Other chest pain: Secondary | ICD-10-CM | POA: Diagnosis present

## 2013-06-02 DIAGNOSIS — K264 Chronic or unspecified duodenal ulcer with hemorrhage: Secondary | ICD-10-CM

## 2013-06-02 DIAGNOSIS — R197 Diarrhea, unspecified: Secondary | ICD-10-CM | POA: Diagnosis present

## 2013-06-02 DIAGNOSIS — I059 Rheumatic mitral valve disease, unspecified: Secondary | ICD-10-CM

## 2013-06-02 DIAGNOSIS — Z8669 Personal history of other diseases of the nervous system and sense organs: Secondary | ICD-10-CM

## 2013-06-02 DIAGNOSIS — E860 Dehydration: Secondary | ICD-10-CM

## 2013-06-02 DIAGNOSIS — R079 Chest pain, unspecified: Secondary | ICD-10-CM

## 2013-06-02 DIAGNOSIS — D649 Anemia, unspecified: Secondary | ICD-10-CM

## 2013-06-02 DIAGNOSIS — D510 Vitamin B12 deficiency anemia due to intrinsic factor deficiency: Secondary | ICD-10-CM | POA: Diagnosis present

## 2013-06-02 DIAGNOSIS — K920 Hematemesis: Secondary | ICD-10-CM

## 2013-06-02 DIAGNOSIS — N952 Postmenopausal atrophic vaginitis: Secondary | ICD-10-CM

## 2013-06-02 DIAGNOSIS — M899 Disorder of bone, unspecified: Secondary | ICD-10-CM

## 2013-06-02 DIAGNOSIS — R030 Elevated blood-pressure reading, without diagnosis of hypertension: Secondary | ICD-10-CM

## 2013-06-02 DIAGNOSIS — IMO0001 Reserved for inherently not codable concepts without codable children: Secondary | ICD-10-CM

## 2013-06-02 DIAGNOSIS — J9801 Acute bronchospasm: Secondary | ICD-10-CM

## 2013-06-02 DIAGNOSIS — M949 Disorder of cartilage, unspecified: Secondary | ICD-10-CM

## 2013-06-02 DIAGNOSIS — K219 Gastro-esophageal reflux disease without esophagitis: Secondary | ICD-10-CM

## 2013-06-02 DIAGNOSIS — K529 Noninfective gastroenteritis and colitis, unspecified: Secondary | ICD-10-CM

## 2013-06-02 DIAGNOSIS — J309 Allergic rhinitis, unspecified: Secondary | ICD-10-CM

## 2013-06-02 DIAGNOSIS — R112 Nausea with vomiting, unspecified: Secondary | ICD-10-CM

## 2013-06-02 DIAGNOSIS — E43 Unspecified severe protein-calorie malnutrition: Secondary | ICD-10-CM

## 2013-06-02 DIAGNOSIS — R932 Abnormal findings on diagnostic imaging of liver and biliary tract: Secondary | ICD-10-CM

## 2013-06-02 DIAGNOSIS — M25561 Pain in right knee: Secondary | ICD-10-CM

## 2013-06-02 DIAGNOSIS — M26629 Arthralgia of temporomandibular joint, unspecified side: Secondary | ICD-10-CM

## 2013-06-02 DIAGNOSIS — Z0000 Encounter for general adult medical examination without abnormal findings: Secondary | ICD-10-CM

## 2013-06-02 HISTORY — DX: Other benign neoplasm of skin, unspecified: D23.9

## 2013-06-02 LAB — TROPONIN I
Troponin I: <0.3 ng/mL (ref <0.30)
Troponin I: <0.3 ng/mL (ref <0.30)

## 2013-06-02 LAB — CBC
HCT: 35.9 % — ABNORMAL LOW (ref 36.0–46.0)
Hemoglobin: 12 g/dL (ref 12.0–15.0)
MCH: 31.1 pg (ref 26.0–34.0)
MCHC: 33.4 g/dL (ref 30.0–36.0)
MCV: 93 fL (ref 78.0–100.0)
Platelets: 379 10*3/uL (ref 150–400)
RBC: 3.86 MIL/uL — ABNORMAL LOW (ref 3.87–5.11)
RDW: 13.5 % (ref 11.5–15.5)
WBC: 10.5 10*3/uL (ref 4.0–10.5)

## 2013-06-02 LAB — COMPREHENSIVE METABOLIC PANEL
ALT: 15 U/L (ref 0–35)
AST: 25 U/L (ref 0–37)
Albumin: 3.2 g/dL — ABNORMAL LOW (ref 3.5–5.2)
Alkaline Phosphatase: 87 U/L (ref 39–117)
BUN: 11 mg/dL (ref 6–23)
CO2: 23 mEq/L (ref 19–32)
Calcium: 9.4 mg/dL (ref 8.4–10.5)
Chloride: 96 mEq/L (ref 96–112)
Creatinine, Ser: 0.52 mg/dL (ref 0.50–1.10)
GFR calc Af Amer: >90 mL/min (ref >90)
GFR calc non Af Amer: >90 mL/min (ref >90)
Glucose, Bld: 102 mg/dL — ABNORMAL HIGH (ref 70–99)
Potassium: 4.3 mEq/L (ref 3.7–5.3)
Sodium: 137 mEq/L (ref 137–147)
Total Bilirubin: 0.6 mg/dL (ref 0.3–1.2)
Total Protein: 7.1 g/dL (ref 6.0–8.3)

## 2013-06-02 MED ORDER — ACYCLOVIR 5 % EX OINT
TOPICAL_OINTMENT | CUTANEOUS | Status: DC
  Administered 2013-06-02 – 2013-06-04 (×9): via TOPICAL
  Filled 2013-06-02: qty 30

## 2013-06-02 MED ORDER — IOHEXOL 300 MG/ML  SOLN: 25.0000 mL | INTRAMUSCULAR | Status: AC

## 2013-06-02 MED ORDER — BIOTENE DRY MOUTH MT LIQD
15.0000 mL | Freq: Two times a day (BID) | OROMUCOSAL | Status: DC
  Administered 2013-06-02 – 2013-06-03 (×3): 15 mL via OROMUCOSAL

## 2013-06-02 MED ORDER — ONDANSETRON HCL 4 MG PO TABS: 4.0000 mg | ORAL_TABLET | Freq: Four times a day (QID) | ORAL | Status: DC | PRN

## 2013-06-02 MED ORDER — SODIUM CHLORIDE 0.9 % IJ SOLN
3.0000 mL | Freq: Two times a day (BID) | INTRAMUSCULAR | Status: DC
  Administered 2013-06-03: 3 mL via INTRAVENOUS

## 2013-06-02 MED ORDER — ONDANSETRON HCL 4 MG/2ML IJ SOLN
4.0000 mg | Freq: Four times a day (QID) | INTRAMUSCULAR | Status: DC | PRN
  Administered 2013-06-02 – 2013-06-04 (×3): 4 mg via INTRAVENOUS
  Filled 2013-06-02 (×3): qty 2

## 2013-06-02 MED ORDER — MORPHINE SULFATE 2 MG/ML IJ SOLN
1.0000 mg | INTRAMUSCULAR | Status: DC | PRN
  Administered 2013-06-02 – 2013-06-03 (×2): 1 mg via INTRAVENOUS
  Filled 2013-06-02 (×2): qty 1

## 2013-06-02 MED ORDER — PANTOPRAZOLE SODIUM 40 MG IV SOLR
40.0000 mg | Freq: Two times a day (BID) | INTRAVENOUS | Status: DC
Start: 2013-06-02 — End: 2013-06-03
  Administered 2013-06-02 – 2013-06-03 (×2): 40 mg via INTRAVENOUS
  Filled 2013-06-02 (×3): qty 40

## 2013-06-02 MED ORDER — SODIUM CHLORIDE 0.9 % IV SOLN
INTRAVENOUS | Status: DC
  Administered 2013-06-02 – 2013-06-03 (×2): via INTRAVENOUS

## 2013-06-02 MED ORDER — SACCHAROMYCES BOULARDII 250 MG PO CAPS
250.0000 mg | ORAL_CAPSULE | Freq: Two times a day (BID) | ORAL | Status: DC
  Administered 2013-06-02 – 2013-06-03 (×3): 250 mg via ORAL
  Filled 2013-06-02 (×5): qty 1

## 2013-06-02 MED ORDER — ACETAMINOPHEN 650 MG RE SUPP
650.0000 mg | Freq: Four times a day (QID) | RECTAL | Status: DC | PRN
Start: 2013-06-02 — End: 2013-06-04

## 2013-06-02 MED ORDER — ACETAMINOPHEN 325 MG PO TABS: 650.0000 mg | ORAL_TABLET | Freq: Four times a day (QID) | ORAL | Status: DC | PRN

## 2013-06-02 MED ORDER — ONDANSETRON HCL 4 MG/2ML IJ SOLN: 4.0000 mg | Freq: Four times a day (QID) | INTRAMUSCULAR | Status: DC | PRN

## 2013-06-02 NOTE — Progress Notes (Signed)
Pt has arrived to floor. Called [employer] and notified of pt arrival to 6N10.  Karla Roman

## 2013-06-02 NOTE — Telephone Encounter (Signed)
PENDING ACCEPTANCE TRANFER NOTE:  Call received from:    Dr. Linda Hedges  REASON FOR REQUESTING TRANSFER:    N/V, reported coffee ground emesis  HPI:   73 year old female discharged yesterday from [facility] hospital after she was admitted for nausea and vomiting, treated for gastroparesis. She went to see her primary care physician today and she continued to have nausea and vomiting, she reported also coffee-ground emesis. No labs were done, she'll be directly admitted to [facility]:  According to telephone report, this patient was accepted for [facility] to [facility],   Under [facility] team:  10,  I have requested an order be written to call Flow Manager at [phone] upon patient arrival to the floor for final physician assignment who will do the admission and give admitting orders.  SIGNED: Birdie Hopes, MD [facility]  06/02/2013, 12:37 PM

## 2013-06-02 NOTE — Progress Notes (Signed)
Pre visit review using our clinic review tool, if applicable. No additional management support is needed unless otherwise documented below in the visit note. 

## 2013-06-02 NOTE — H&P (Signed)
[facility]                                                                                    Patient Demographics  Karla Roman, is a 73 y.o. female  MRN: [id number]   DOB - [birth year]  Admit Date - 06/02/2013  Outpatient Primary MD for the patient is Adella Hare, MD   With History of -  Past Medical History  Diagnosis Date  . Diverticulitis   . GERD (gastroesophageal reflux disease)   . Atrophic vaginitis   . MVP (mitral valve prolapse)   . Vertigo   . Osteopenia   . Eustachian tube dysfunction   . Anemia     in childhood  . Hiatal hernia   . Sleep apnea   . Dyspareunia   . Diverticulosis   . Hx of adenomatous colonic polyps   . Schatzki's ring 1995  . Allergic rhinitis       Past Surgical History  Procedure Laterality Date  . Appendectomy  1989  . Abdominal hysterectomy      with anterior and posterior vaginal repair  . Tonsillectomy    . Breast biopsy      RIGHT  . Septoplasty    . Cervical discectomy      C3-4, C5-6, C6-7  . Laparoscopy  04/2000    with lysis of adhesions  . Shoulder surgery      left  . Hand surgery      right  . Melanoma excision      right thigh    in for coffee ground emesis and diarrhea with nausea.     HPI  Karla Roman  is a 73 y.o. female presents to the ED with a CC of coffee ground emesis, diarrhea and nausea.  Pt has a PMH of diverticulosis, diverticulitis, GERD, MVP, anemia, hiatal hernia, schatzki's ring. Pt presented to her PCP with 2 weeks of flu like symptoms and was treated with Tamiflu (stopped this medication after 2-3 days secondary to nausea and diarrhea).  Pt was placed on Zofran for nausea on 05/20/13.  Pt presented to the ED on 05/22/13 and was diagnosed with CAP and started on a 1 week course of Levaquin.  Pt was then admitted into the hospital on 05/29/13 reporting 6 episodes of loose stools over 24 hrs and 2 episodes of vomiting and was currently on her 6th day of Levaquin.  Pt was treated for  gastroenteritis and d/c on 06/01/13.  Pt presents to the ED complaining of vomiting and diarrhea that began after being d/c on 06/01/13.  Pt appears dehydrated and confused, cannot recall the timeline of details since the N/V began.  Pt is experiencing abdominal pain, denies any use of pepto-bismol and chronic NSAID use.  States that both the vomit and diarrhea are coffee colored.  Pt has been experiencing a "funny feeling" in her left chest, felt like irregular heartbeat.  Review of Systems   Constitutional: negative for anorexia, fevers and sweats Eyes: negative for irritation, redness and visual disturbance Ears, nose, mouth, throat, and face: negative for earaches, epistaxis, nasal congestion and sore throat Respiratory: negative for cough, dyspnea on exertion, sputum  and wheezing Cardiovascular: negative for chest pain, dyspnea, lower extremity edema, orthopnea, palpitations and syncope Gastrointestinal: Per HPI Genitourinary:negative for dysuria, frequency and hematuria Hematologic/lymphatic: negative for bleeding, easy bruising and lymphadenopathy Musculoskeletal:negative for arthralgias, muscle weakness and stiff joints Neurological: negative for coordination problems, gait problems, headaches and weakness Endocrine: negative for diabetic symptoms including polydipsia, polyuria and weight loss Allergic/Immunologic: negative for anaphylaxis, hay fever and urticaria  Social History History  Substance Use Topics  . Smoking status: Never Smoker   . Smokeless tobacco: Never Used  . Alcohol Use: No   Family History Family History  Problem Relation Age of Onset  . Heart attack Father     after years of angina  . Coronary artery disease Father   . Coronary artery disease Mother   . Breast cancer Maternal Aunt   . Cancer Maternal Aunt     breast, colon, ovarian  . Diabetes Brother   . Coronary artery disease Brother   . Hypertension Sister   . Hyperthyroidism Sister   . Coronary  artery disease Brother     PCI-stents    Prior to Admission medications   Medication Sig Start Date End Date Taking? Authorizing Provider  acetaminophen (TYLENOL) 500 MG tablet Take 500-1,000 mg by mouth every 6 (six) hours as needed for mild pain or headache.    Historical Provider, MD  acyclovir ointment (ZOVIRAX) 5 % Apply topically every 3 (three) hours. 06/01/13   Venetia Maxon Rama, MD  albuterol (PROVENTIL HFA;VENTOLIN HFA) 108 (90 BASE) MCG/ACT inhaler Inhale 2 puffs into the lungs every 4 (four) hours as needed for wheezing or shortness of breath. 05/22/13   Mirna Mires, MD  aspirin 81 MG tablet Take 81 mg by mouth daily.      Historical Provider, MD  Calcium Carbonate (CALTRATE 600) 1500 MG TABS Take 1 tablet by mouth 2 (two) times daily. chewables    Historical Provider, MD  cyanocobalamin 500 MCG tablet Take 500 mcg by mouth every morning.    Historical Provider, MD  dextromethorphan (DELSYM) 30 MG/5ML liquid Take 10 mLs (60 mg total) by mouth 2 (two) times daily. 06/01/13   Venetia Maxon Rama, MD  famotidine (PEPCID) 40 MG tablet Take 40 mg by mouth every morning.    Historical Provider, MD  levofloxacin (LEVAQUIN) 25 MG/ML solution  05/23/13   Historical Provider, MD  Multiple Vitamins-Minerals (MULTIVITAMIN WITH MINERALS) tablet Take 1 tablet by mouth every morning. chewables    Historical Provider, MD  omega-3 acid ethyl esters (LOVAZA) 1 G capsule Take 1 g by mouth every morning.    Historical Provider, MD  promethazine (PHENERGAN) 12.5 MG tablet Take 1 tablet (12.5 mg total) by mouth every 6 (six) hours as needed for nausea or vomiting. 05/21/13   Janith Lima, MD  saccharomyces boulardii (FLORASTOR) 250 MG capsule Take 1 capsule (250 mg total) by mouth 2 (two) times daily. 06/01/13   Venetia Maxon Rama, MD  vitamin D, CHOLECALCIFEROL, 400 UNITS tablet Take 400 Units by mouth every morning.     Historical Provider, MD  vitamin E 400 UNIT capsule Take 400 Units by mouth every morning.      Historical Provider, MD    Allergies  Allergen Reactions  . Codeine Other (See Comments)    Anxiety, restlessness  . Levaquin [Levofloxacin]     Nausea, vomiting, diarrhea.   Physical Exam  Vitals  Blood pressure 148/64, pulse 99, temperature 98.6 F (37 C), temperature source Oral, SpO2 96.00%. General appearance:  alert, cooperative and no distress  Head: Normocephalic, without obvious abnormality, atraumatic  Eyes: conjunctivae/corneas clear. PERRL, EOM's intact. Fundi benign.  Nose: Nares normal. Septum midline. Mucosa normal. No drainage or sinus tenderness.  Throat: lips, mucosa, and tongue normal; teeth and gums normal  Neck: Supple, no masses, no cervical lymphadenopathy, no JVD appreciated, no meningeal signs Resp: clear to auscultation bilaterally  Chest wall: no tenderness  Cardio: regular rate and rhythm, S1, S2 normal, no murmur, click, rub or gallop  GI: soft, non-tender; bowel sounds normal; no masses, no organomegaly  Extremities: extremities normal, atraumatic, no cyanosis or edema  Skin: Skin color, texture, turgor normal. No rashes or lesions  Neurologic: Alert and oriented X 3, normal strength and tone. Normal symmetric reflexes. Normal coordination and gait    Data Review  CBC  Recent Labs Lab 05/29/13 0655 05/30/13 0553 06/02/13 1540  WBC 12.5* 5.6 10.5  HGB 12.6 10.4* 12.0  HCT 36.4 32.0* 35.9*  PLT 415* 325 379  MCV 90.8 92.8 93.0  MCH 31.4 30.1 31.1  MCHC 34.6 32.5 33.4  RDW 13.0 13.3 13.5  LYMPHSABS 1.3  --   --   MONOABS 0.7  --   --   EOSABS 0.1  --   --   BASOSABS 0.0  --   --    ------------------------------------------------------------------------------------------------------------------  Chemistries   Recent Labs Lab 05/29/13 0655 05/29/13 0903 05/30/13 0553 06/02/13 1540  NA 134*  --  140 137  K 4.4 4.2 3.7 4.3  CL 93*  --  105 96  CO2 22  --  24 23  GLUCOSE 115*  --  95 102*  BUN 11  --  5* 11  CREATININE 0.60   --  0.58 0.52  CALCIUM 8.9  --  8.1* 9.4  MG  --  2.1  --   --   AST 31  --   --  25  ALT 20  --   --  15  ALKPHOS 96  --   --  87  BILITOT 0.5  --   --  0.6   ------------------------------------------------------------------------------------------------------------------ CrCl is unknown because both a height and weight (above a minimum accepted value) are required for this calculation. ------------------------------------------------------------------------------------------------------------------ No results found for this basename: TSH, T4TOTAL, FREET3, T3FREE, THYROIDAB,  in the last 72 hours   Coagulation profile No results found for this basename: INR, PROTIME,  in the last 168 hours ------------------------------------------------------------------------------------------------------------------- No results found for this basename: DDIMER,  in the last 72 hours -------------------------------------------------------------------------------------------------------------------  Cardiac Enzymes  Recent Labs Lab 06/02/13 1540  TROPONINI <0.30   ------------------------------------------------------------------------------------------------------------------ No components found with this basename: POCBNP,    ---------------------------------------------------------------------------------------------------------------  Urinalysis    Component Value Date/Time   COLORURINE YELLOW 05/29/2013 0934   APPEARANCEUR CLEAR 05/29/2013 0934   LABSPEC 1.008 05/29/2013 0934   PHURINE 6.0 05/29/2013 0934   GLUCOSEU NEGATIVE 05/29/2013 0934   HGBUR TRACE* 05/29/2013 0934   BILIRUBINUR NEGATIVE 05/29/2013 0934   KETONESUR 40* 05/29/2013 0934   PROTEINUR NEGATIVE 05/29/2013 0934   UROBILINOGEN 0.2 05/29/2013 0934   NITRITE NEGATIVE 05/29/2013 0934   LEUKOCYTESUR NEGATIVE 05/29/2013 0934     ----------------------------------------------------------------------------------------------------------------  Imaging results:   Dg Chest 2 View  05/29/2013   CLINICAL DATA:  Chest discomfort. Vomiting. Diarrhea. Recent pneumonia.  EXAM: CHEST  2 VIEW  COMPARISON:  None.  FINDINGS: Improving bibasilar infiltrates seen since previous study. New tiny left pleural effusion noted. No new or worsening areas of infiltrate seen. Heart size  is normal.  IMPRESSION: Improving bibasilar infiltrates.  New tiny left pleural effusion.   Electronically Signed   By: Earle Gell M.D.   On: 05/29/2013 07:28   Dg Chest 2 View  05/22/2013   CLINICAL DATA:  Productive cough  EXAM: CHEST  2 VIEW  COMPARISON:  04/01/2011  FINDINGS: Cardiac shadow is within normal limits. The lungs are well aerated bilaterally but demonstrate bibasilar infiltrative changes. No acute bony abnormality is noted. Previously seen density in the right lung apex is less well visualized on the current exam.  IMPRESSION: Bibasilar infiltrates.   Electronically Signed   By: Inez Catalina M.D.   On: 05/22/2013 18:31   Dg Abd 1 View  05/29/2013   CLINICAL DATA:  Nausea vomiting and diarrhea for 3 days  EXAM: ABDOMEN - 1 VIEW  COMPARISON:  None.  FINDINGS: No abnormally dilated loops of bowel. Bowel gas pattern is unremarkable. Mild aortic calcification noted. No abnormal opacities otherwise.  IMPRESSION: Bowel gas pattern within normal limits   Electronically Signed   By: Skipper Cliche M.D.   On: 05/29/2013 10:22    My personal review of EKG: Rhythm NSR, Rate  91 /min, QTc 440 , no Acute ST changes,  Premature atrial complexes    Assessment & Plan  Principal Problem:   Coffee ground emesis Active Problems:   GERD   CHEST PAIN   Diarrhea   Anemia   Dehydration   Nausea and vomiting   Principal Problem  Nausea and vomiting -Unclear etiology, treated recently for gastroenteritis with symptomatic management. -Denies any fever,  chills or any other symptoms suggest infections. -Obtain CT scan of abdomen pelvis with contrast. -Supportive management with antiemetics, start clear liquids advance as tolerated.  Coffee ground emesis -Per pt history, vomited coffee-ground emesis once. -CBC showed hemoglobin of 12, likely this is hemoconcentration, check CBC in a.m. after IV fluids. -Will check vomitus and stool for occult blood -Start Pt on IV Protonix 40mg  BID -Consult GI if blood present in vomitus or stool  Dehydration -Likely secondary to vomiting, diarrhea and lack of appetite -Starting Pt on 12ml/hr IV fluids -Will continue to monitor for improvement  GERD -Pt has PMH of GERD, was taking Pepcid at home -Held Pepcid, started [employer].  Chest Pain -Pt reports "funny feeling" in left chest -Ordered 3 sets of troponin and EKG, twelve-lead EKG and troponins showed no acute changes. -Will continue to monitor  Diarrhea -Pt reports having loose, watery diarrhea x2 weeks.  Recently darker in color -Have order fecal occult blood testing  Anemia -Hemoglobin was 10.4 on 1/18. Check today is 12.0. -This is likely secondary to dehydration and hemoconcentration. Check CBC in a.m. -On 05/29/13 Hg was 12.6 and on 05/30/13 it was 10.4 -Have ordered CBC, awaiting on results.  DVT Prophylaxis SCDs; no anti-coags due to possible GI bleed  AM Labs Ordered, also please review Full Orders  Family Communication:   Husband at bedside   Code Status:  Full  Likely DC to:  Home  Condition:  Stable  Time spent in minutes : 70 minutes    Cameron Proud PA-S on 06/02/2013 at 4:59 PM  Between 7am to 7pm - Pager - [phone]  After 7pm go to [email or web address] - password [email or web address]  And look for the night coverage person covering me after hours  [place]  [phone]  Addendum  Patient seen and examined, chart and data base reviewed.  I agree with the above assessment and plan.  For full details  please see Mr. Cameron Proud PA-S note.  I reviewed aD addended the above note as needed.   Birdie Hopes, MD [facility] Pager: [phone] 06/02/2013, 5:01 PM

## 2013-06-02 NOTE — Progress Notes (Signed)
Subjective:    Patient ID: [name], female    DOB: [birth year], 73 y.o.   MRN: [id number]  HPI Mrs. Millirons was discharged from the hospital yesterday after a 3 day admission for N/V/D thought to be gastroenteritis. She had had influenza treated with Tamiflu followed by CAP, diagnosed in the ED, treated with levaquin. She had persistent N/V/D and was subsequently admitted for IV fluids, anti-emetics and H2 blocker by vein. She was discharged yesterday. She presents today for persistent symptoms - unable to keep down food or fluids, increased weakness, pallor. She reports one episode of coffee-ground emesis and does have abdominal pain. She has had soft stool that was dark. She does not feel she is able to keep down food or fluids.  PMH, FamHx and SocHx reviewed for any changes and relevance.  Current Outpatient Prescriptions on File Prior to Visit  Medication Sig Dispense Refill  . acetaminophen (TYLENOL) 500 MG tablet Take 500-1,000 mg by mouth every 6 (six) hours as needed for mild pain or headache.      Marland Kitchen acyclovir ointment (ZOVIRAX) 5 % Apply topically every 3 (three) hours.      Marland Kitchen albuterol (PROVENTIL HFA;VENTOLIN HFA) 108 (90 BASE) MCG/ACT inhaler Inhale 2 puffs into the lungs every 4 (four) hours as needed for wheezing or shortness of breath.  1 Inhaler  0  . aspirin 81 MG tablet Take 81 mg by mouth daily.        . Calcium Carbonate (CALTRATE 600) 1500 MG TABS Take 1 tablet by mouth 2 (two) times daily. chewables      . cyanocobalamin 500 MCG tablet Take 500 mcg by mouth every morning.      Marland Kitchen dextromethorphan (DELSYM) 30 MG/5ML liquid Take 10 mLs (60 mg total) by mouth 2 (two) times daily.  89 mL  0  . famotidine (PEPCID) 40 MG tablet Take 40 mg by mouth every morning.      . Multiple Vitamins-Minerals (MULTIVITAMIN WITH MINERALS) tablet Take 1 tablet by mouth every morning. chewables      . omega-3 acid ethyl esters (LOVAZA) 1 G capsule Take 1 g by mouth every morning.      .  promethazine (PHENERGAN) 12.5 MG tablet Take 1 tablet (12.5 mg total) by mouth every 6 (six) hours as needed for nausea or vomiting.  30 tablet  0  . saccharomyces boulardii (FLORASTOR) 250 MG capsule Take 1 capsule (250 mg total) by mouth 2 (two) times daily.  60 capsule  0  . vitamin D, CHOLECALCIFEROL, 400 UNITS tablet Take 400 Units by mouth every morning.       . vitamin E 400 UNIT capsule Take 400 Units by mouth every morning.        No current facility-administered medications on file prior to visit.     Review of Systems System review is negative for any constitutional, cardiac, pulmonary, GI or neuro symptoms or complaints other than as described in the HPI.     Objective:   Physical Exam Filed Vitals:   06/02/13 1120  BP: 150/70  Pulse: 97  Temp: 98.2 F (36.8 C)   Gen'l - ill appearing woman, pale and washed out HEENT- C&S clear, PERRLA Neck - supple Nodes - no adenopathy appreciated in the cervical region Cor 2+ radial pulse, regular tachycardia, no M/R/G Pulm - wet cough, lungs CTAP Abd - BS+, no guarding but very tender to palpation in the epigastrium Neuro - Awake and alert.  Assessment & Plan:  N/V/D w/ dehydration - Mrs. Espino looks pale and has not been able to keep down medications or fluids.  Plan Refer to [facility] for admission for IV fluids, anti-emetics, PPI, routine lab.

## 2013-06-02 NOTE — Progress Notes (Signed)
Pt complaining of "funny feeling" in her left chest.  No complaints of pain.  VS stable.  MD notified.  Will continue to monitor.  Eliezer [place]

## 2013-06-03 ENCOUNTER — Inpatient Hospital Stay (HOSPITAL_COMMUNITY): Payer: Medicare Other

## 2013-06-03 ENCOUNTER — Encounter (HOSPITAL_COMMUNITY): Payer: Self-pay | Admitting: Radiology

## 2013-06-03 DIAGNOSIS — R932 Abnormal findings on diagnostic imaging of liver and biliary tract: Secondary | ICD-10-CM

## 2013-06-03 DIAGNOSIS — K5289 Other specified noninfective gastroenteritis and colitis: Secondary | ICD-10-CM

## 2013-06-03 LAB — BASIC METABOLIC PANEL
BUN: 10 mg/dL (ref 6–23)
CO2: 23 mEq/L (ref 19–32)
Calcium: 8.7 mg/dL (ref 8.4–10.5)
Chloride: 102 mEq/L (ref 96–112)
Creatinine, Ser: 0.53 mg/dL (ref 0.50–1.10)
GFR calc Af Amer: >90 mL/min (ref >90)
GFR calc non Af Amer: >90 mL/min (ref >90)
Glucose, Bld: 86 mg/dL (ref 70–99)
Potassium: 4.3 mEq/L (ref 3.7–5.3)
Sodium: 139 mEq/L (ref 137–147)

## 2013-06-03 LAB — CBC
HCT: 34 % — ABNORMAL LOW (ref 36.0–46.0)
Hemoglobin: 11 g/dL — ABNORMAL LOW (ref 12.0–15.0)
MCH: 30.4 pg (ref 26.0–34.0)
MCHC: 32.4 g/dL (ref 30.0–36.0)
MCV: 93.9 fL (ref 78.0–100.0)
Platelets: 385 10*3/uL (ref 150–400)
RBC: 3.62 MIL/uL — ABNORMAL LOW (ref 3.87–5.11)
RDW: 13.3 % (ref 11.5–15.5)
WBC: 8.6 10*3/uL (ref 4.0–10.5)

## 2013-06-03 LAB — OCCULT BLOOD X 1 CARD TO LAB, STOOL
Fecal Occult Bld: POSITIVE — AB
Fecal Occult Bld: NEGATIVE
Fecal Occult Bld: NEGATIVE

## 2013-06-03 LAB — HEPATIC FUNCTION PANEL
ALT: 17 U/L (ref 0–35)
AST: 37 U/L (ref 0–37)
Albumin: 2.7 g/dL — ABNORMAL LOW (ref 3.5–5.2)
Alkaline Phosphatase: 80 U/L (ref 39–117)
Bilirubin, Direct: <0.2 mg/dL (ref 0.0–0.3)
Total Bilirubin: 0.5 mg/dL (ref 0.3–1.2)
Total Protein: 6.2 g/dL (ref 6.0–8.3)

## 2013-06-03 LAB — TROPONIN I: Troponin I: <0.3 ng/mL (ref <0.30)

## 2013-06-03 LAB — LIPASE, BLOOD: Lipase: 22 U/L (ref 11–59)

## 2013-06-03 MED ORDER — IOHEXOL 300 MG/ML  SOLN
100.0000 mL | Freq: Once | INTRAMUSCULAR | Status: AC | PRN
  Administered 2013-06-03: 100 mL via INTRAVENOUS

## 2013-06-03 MED ORDER — PANTOPRAZOLE SODIUM 40 MG IV SOLR
40.0000 mg | INTRAVENOUS | Status: DC
  Administered 2013-06-04: 40 mg via INTRAVENOUS
  Filled 2013-06-03: qty 40

## 2013-06-03 MED ORDER — SODIUM CHLORIDE 0.9 % IV SOLN
INTRAVENOUS | Status: DC
  Administered 2013-06-03: 23:00:00 via INTRAVENOUS

## 2013-06-03 MED ORDER — DEXTROMETHORPHAN POLISTIREX 30 MG/5ML PO LQCR
15.0000 mg | Freq: Two times a day (BID) | ORAL | Status: DC
  Administered 2013-06-03: 15 mg via ORAL
  Filled 2013-06-03 (×4): qty 5

## 2013-06-03 MED ORDER — GADOBENATE DIMEGLUMINE 529 MG/ML IV SOLN
10.0000 mL | Freq: Once | INTRAVENOUS | Status: AC
  Administered 2013-06-03: 10 mL via INTRAVENOUS

## 2013-06-03 NOTE — Progress Notes (Signed)
INITIAL NUTRITION ASSESSMENT  DOCUMENTATION CODES Per approved criteria  -Severe malnutrition in the context of acute illness or injury   INTERVENTION: Diet advancement per MD discretion Provide Ensure Complete or Resource Breeze when diet advanced Provide Magic cup once daily when diet advanced Encourage PO intake as tolerated  NUTRITION DIAGNOSIS: Inadequate oral intake related to nausea and poor appetite as evidenced by 8% weight loss in less than 3 months.   Goal: Pt to meet >/= 90% of their estimated nutrition needs   Monitor:  Diet advancement PO intake Weight  Labs  Reason for Assessment: Malnutrition Screening Tool, score of 2  73 y.o. female  Admitting Dx: Coffee ground emesis  ASSESSMENT: 73 y.o. female presents to the ED with a CC of coffee ground emesis, diarrhea and nausea. Pt has a PMH of diverticulosis, diverticulitis, GERD, MVP, anemia, hiatal hernia, schatzki's ring. Pt presented to her PCP with 2 weeks of flu like symptoms and was treated with Tamiflu (stopped this medication after 2-3 days secondary to nausea and diarrhea). Pt was placed on Zofran for nausea on 05/20/13. Pt presented to the ED on 05/22/13 and was diagnosed with CAP and started on a 1 week course of Levaquin. Pt was then admitted into the hospital on 05/29/13 reporting 6 episodes of loose stools over 24 hrs and 2 episodes of vomiting and was currently on her 6th day of Levaquin. Pt was treated for gastroenteritis and d/c on 06/01/13. Pt presents to the ED complaining of vomiting and diarrhea that began after being d/c on 06/01/13.  Pt reports poor appetite and poor po intake for the past month. She states for the past week she has not been eating; she was able to tolerate one meal while at [facility] last week but, for the most part she has been sipping on clear liquids. Pt states that she usually weighs 118 lbs. Weight history shows 8% weight loss in less than 3 months. Encouraged pt to consume nutritional  supplements when diet is advanced and to snack in between meals to improve nutritional status.  Moderate to severe muscle wasting noted in pt's temples and clavicles.   Pt meets criteria for SEVERE MALNUTRITION in the context of ACUTE ILLNESS as evidenced by 8% weight loss in less than 3 months, moderate to severe muscle wasting, and estimated energy intake <50% of estimated energy requirements for > 5 days.   Height: Ht Readings from Last 1 Encounters:  05/29/13 5\' 2"  (1.575 m)    Weight: Wt Readings from Last 1 Encounters:  06/02/13 109 lb 12.8 oz (49.805 kg)    Ideal Body Weight: 110 lbs  % Ideal Body Weight: 99%  Wt Readings from Last 10 Encounters:  06/02/13 109 lb 12.8 oz (49.805 kg)  05/29/13 112 lb 9.6 oz (51.075 kg)  05/20/13 112 lb (50.803 kg)  03/18/13 118 lb (53.524 kg)  12/23/12 116 lb (52.617 kg)  09/28/12 119 lb (53.978 kg)  09/16/12 116 lb (52.617 kg)  12/23/11 115 lb (52.164 kg)  10/01/11 114 lb 6.4 oz (51.891 kg)  04/01/11 112 lb 8 oz (51.03 kg)    Usual Body Weight: 118 lbs  % Usual Body Weight: 92%  BMI:  19.9 kg/(m^2)  Estimated Nutritional Needs: Kcal: 1400-1600 Protein: 60-70 grams Fluid: 1.4-1.6 L/day  Skin: WDL  Diet Order: NPO  EDUCATION NEEDS: -No education needs identified at this time   Intake/Output Summary (Last 24 hours) at 06/03/13 1458 Last data filed at 06/03/13 1300  Gross per 24 hour  Intake    240 ml  Output    200 ml  Net     40 ml    Last BM: 1/21  Labs:   Recent Labs Lab 05/29/13 0655 05/29/13 0903 05/30/13 0553 06/02/13 1540 06/03/13 0350  NA 134*  --  140 137 139  K 4.4 4.2 3.7 4.3 4.3  CL 93*  --  105 96 102  CO2 22  --  24 23 23   BUN 11  --  5* 11 10  CREATININE 0.60  --  0.58 0.52 0.53  CALCIUM 8.9  --  8.1* 9.4 8.7  MG  --  2.1  --   --   --   GLUCOSE 115*  --  95 102* 86    CBG (last 3)  No results found for this basename: GLUCAP,  in the last 72 hours  Scheduled Meds: . acyclovir  ointment   Topical Q3H  . antiseptic oral rinse  15 mL Mouth Rinse BID  . [START ON 06/04/2013] pantoprazole (PROTONIX) IV  40 mg Intravenous Q24H  . saccharomyces boulardii  250 mg Oral BID  . sodium chloride  3 mL Intravenous Q12H    Continuous Infusions: . sodium chloride 50 mL/hr at 06/03/13 1338    Past Medical History  Diagnosis Date  . Diverticulitis   . GERD (gastroesophageal reflux disease)   . Atrophic vaginitis   . MVP (mitral valve prolapse)   . Vertigo   . Osteopenia   . Eustachian tube dysfunction   . Anemia     in childhood  . Hiatal hernia   . Sleep apnea 10/2004    Sleep study by Dr Gwenette Greet.   Marland Kitchen Dyspareunia   . Diverticulosis   . Hx of adenomatous colonic polyps 02/2009    Tubular adenoma in sigmoid. Diminutive.   . Schatzki's ring 1995  . Allergic rhinitis   . Dysplastic nevus 09/2012, 10/2012    right thigh, severe atypia, resected 10/2012 by Dr Jarome Matin    Past Surgical History  Procedure Laterality Date  . Appendectomy  1989  . Abdominal hysterectomy      with anterior and posterior vaginal repair  . Tonsillectomy    . Breast biopsy      RIGHT  . Septoplasty    . Cervical discectomy  01/2004    C3-4, C5-6, C6-7  . Laparoscopy  04/2000    with lysis of adhesions  . Shoulder surgery Left 01/2004    release of adhesive capsulitis  . Depuytren's contraction  04/2006    right  . Dysplastic nevus  10/2012    right thigh  . Right shoulder fracture Right 03/2013    non-displaced, humeral head, non-operative mgt but she developed frozen shoulder.     Pryor Ochoa RD, LDN Inpatient Clinical Dietitian Pager: [phone] After Hours Pager: [phone]

## 2013-06-03 NOTE — Progress Notes (Signed)
Patient had very small amt of brown stool, hemoccult sent.

## 2013-06-03 NOTE — Progress Notes (Signed)
Karla Roman MPN:[id number] DOB: 12-11-1940 DOA: 06/02/2013 PCP: Adella Hare, MD  Brief narrative: 73 year old female, recently discharged 1/20 with gastroenteritis from [facility] to [facility] with coffee-ground emesis, diarrhea with nausea. She carries a past medical history of diverticulosis/icterus, GERD, MVP, anemia, hiatal hernia, Schatzki ring, # humerus 03/18/13, osteopenia,piror fibroid uteri + adenomyosis c Cystorectocoele s/p TAH BSO 2001-please see initial H&P but she had a long and it course over early January resulting in admission to the hospital 1/17 for 6 loose stools and vomiting and was discharged with gastroenteritis. CT scan of the abdomen pelvis showed ill-defined porta hepatis with minimal edema and descending duodenum -hyperenhancement CBD, borderline interim/extrahepatic data the patient Abdominal ultrasound 1/22 = complex cyst right lower of right kidney 17 by 12 mm with central echogenic area LFTs were normal Mildly anemic hemoglobin 11 point, hematocrit 34 point  Past medical history-As per Problem list Chart reviewed as below- reviewed  Consultants:  GI-Dr. Olevia Perches  Procedures:  CT abd pelv  [place] abd  Antibiotics:  none   Subjective  Feels better.  tol liquids this am c/out vomiting.  ?4 dark stools today? Currently NPO for MRCP    Objective    Interim History: none  Telemetry: nsr   Objective: Filed Vitals:   06/02/13 1330 06/02/13 2202 06/03/13 0554  BP: 148/64 149/62 136/65  Pulse: 99 89 96  Temp: 98.6 F (37 C) 98 F (36.7 C) 98.3 F (36.8 C)  TempSrc: Oral Oral Oral  Resp:  20 18  SpO2: 96% 98% 96%    Intake/Output Summary (Last 24 hours) at 06/03/13 1043 Last data filed at 06/03/13 0554  Gross per 24 hour  Intake      0 ml  Output    200 ml  Net   -200 ml    Exam:  General: EOMI, NCAT Cardiovascular: s1 s2 no m/r/g Respiratory: clear Abdomen: soft, Slightly tender  Lower abd Skinno LE edema Neurointact  Data Reviewed: Basic Metabolic Panel:  Recent Labs Lab 05/29/13 0655 05/29/13 0903 05/30/13 0553 06/02/13 1540 06/03/13 0350  NA 134*  --  140 137 139  K 4.4 4.2 3.7 4.3 4.3  CL 93*  --  105 96 102  CO2 22  --  24 23 23   GLUCOSE 115*  --  95 102* 86  BUN 11  --  5* 11 10  CREATININE 0.60  --  0.58 0.52 0.53  CALCIUM 8.9  --  8.1* 9.4 8.7  MG  --  2.1  --   --   --    Liver Function Tests:  Recent Labs Lab 05/29/13 0655 06/02/13 1540 06/03/13 0821  AST 31 25 37  ALT 20 15 17   ALKPHOS 96 87 80  BILITOT 0.5 0.6 0.5  PROT 7.5 7.1 6.2  ALBUMIN 3.2* 3.2* 2.7*   No results found for this basename: LIPASE, AMYLASE,  in the last 168 hours No results found for this basename: AMMONIA,  in the last 168 hours CBC:  Recent Labs Lab 05/29/13 0655 05/30/13 0553 06/02/13 1540 06/03/13 0350  WBC 12.5* 5.6 10.5 8.6  NEUTROABS 10.4*  --   --   --   HGB 12.6 10.4* 12.0 11.0*  HCT 36.4 32.0* 35.9* 34.0*  MCV 90.8 92.8 93.0 93.9  PLT 415* 325 379 385   Cardiac Enzymes:  Recent Labs Lab 06/02/13 1540 06/02/13 2220 06/03/13 0350  TROPONINI <0.30 <0.30 <0.30   BNP: No components found with  this basename: POCBNP,  CBG: No results found for this basename: GLUCAP,  in the last 168 hours  Recent Results (from the past 240 hour(s))  CLOSTRIDIUM DIFFICILE BY PCR     Status: None   Collection Time    05/29/13  1:00 PM      Result Value Range Status   C difficile by pcr NEGATIVE  NEGATIVE Final   Comment: Performed at [facility]     Studies:              All Imaging reviewed and is as per above notation   Scheduled Meds: . acyclovir ointment   Topical Q3H  . antiseptic oral rinse  15 mL Mouth Rinse BID  . pantoprazole (PROTONIX) IV  40 mg Intravenous Q12H  . saccharomyces boulardii  250 mg Oral BID  . sodium chloride  3 mL Intravenous Q12H   Continuous Infusions: . sodium chloride 100 mL/hr at 06/02/13 1731      Assessment/Plan: 1. Undifferentiated Abd pain c N/v-Lipase 22.  MRCP pending.  Possible GE?Marland Kitchen  Await MRCP given ductal dilatation on abd US-further poc per GI 2. Recent CAP-adequately Rx. 3. ? GIB-await GI input. Hemoccult stools  4. Anemia-baseline 11-12.  Monitor 5. cyst right lower pole measures 17 x 12 mm kidney-OP eval.  Stable right now   Code Status: Full Family Communication:  D/w husband bedsdie Disposition Plan:  Inpatient pending w/u   Verneita Griffes, MD  [facility] Pager [phone] 06/03/2013, 10:43 AM    LOS: 1 day

## 2013-06-03 NOTE — Consult Note (Addendum)
[facility] Consult: 12:06 PM 06/03/2013  LOS: 1 day    Referring Provider: Dr Hartford Poli  Primary Care Physician:  Adella Hare, MD Primary Gastroenterologist:  Dr. Delfin Edis    Reason for Consultation:  CG emesis. Anemia. Biliary tree abnormal on CT scan.    HPI: Karla Roman is a 73 y.o. female.    Hx of tubular adenoma and diverticulosis on 2010 colonoscopy.  Also carries hx of GERD.   More than 2 weeks ago she developed watery, brown diarrhea and nausea but no emesis, anorexia.  Some minor cough and congestion.  Her husband had malaise and slight URI sxs, tested + for flu and the MD started Mrs Billiot on Tamiflu 1/8, suspecting her GI sxs were flu related (she had previous flu shot).  Did not have lab assay for flu.  She stopped Tamiflu within 2 to 3 days as her GI sxs progressed, she became dehydrated and was vomiting non bloody/CG material.  At [facility] ED on 1/10, infiltrates on CXR diagnosed her with PNA.  She started on Levaquin and got Rocephin and IVF in ED.  The GI sxs persisted and she contacted Dr Linda Hedges. Admitted to hospital 1/17 - 1/20.  Tested C diff negative. Diagnosed with gastroenteritis.  Treated with fluids, pepcid IV, probiotics.  She improved marginally and tolerated one solid meal and had formed stool on discharge day. The N/V/D returned within 36 hours and she was readmitted on 1/21.  That day she saw Cg emesis.  Stools watery dark.  Vomiting 3 to 4 x daily, stooling 4 x daily.   She is FOBT negative. .  Hgb 12 on admission, down to 11 post hydration. Baseline is 12 to 13.    Ct scan abdomen shows edema in Porta hepatis and area of enhancement in CBD along with borderline intra/extra hepatic duct dilatation.  The LFTs are normal.  Lipase has not been assayed.   Today she feels  better, last loose stool this AM.  No nausea today.  Some mid back pain when she would vomit.  No tea colored urine but some minor oliguria at home.  No general problems with GER sxs.  [facility]id [facility].  Some weight loss, 3# in last 2 weeks. No bleeding PR.  + fatigue and Malaise,  No cough/rhinorrhea (resolved)   Past Medical History  Diagnosis Date  . Diverticulitis   . GERD (gastroesophageal reflux disease)   . Atrophic vaginitis   . MVP (mitral valve prolapse)   . Vertigo   . Osteopenia   . Eustachian tube dysfunction   . Anemia     in childhood  . Hiatal hernia   . Sleep apnea 10/2004    Sleep study by Dr Gwenette Greet.   Marland Kitchen Dyspareunia   . Diverticulosis   . Hx of adenomatous colonic polyps 02/2009    Tubular adenoma in sigmoid. Diminutive.   . Schatzki's ring 1995  . Allergic rhinitis   . Dysplastic nevus 09/2012, 10/2012    right thigh, severe atypia, resected 10/2012 by Dr  Jarome Matin    Past Surgical History  Procedure Laterality Date  . Appendectomy  1989  . Abdominal hysterectomy      with anterior and posterior vaginal repair  . Tonsillectomy    . Breast biopsy      RIGHT  . Septoplasty    . Cervical discectomy  01/2004    C3-4, C5-6, C6-7  . Laparoscopy  04/2000    with lysis of adhesions  . Shoulder surgery Left 01/2004    release of adhesive capsulitis  . Depuytren's contraction  04/2006    right  . Dysplastic nevus  10/2012    right thigh  . Right shoulder fracture Right 03/2013    non-displaced, humeral head, non-operative mgt but she developed frozen shoulder.     Prior to Admission medications   Medication Sig Start Date End Date Taking? Authorizing Provider  acetaminophen (TYLENOL) 500 MG tablet Take 500-1,000 mg by mouth every 6 (six) hours as needed for mild pain or headache.   Yes Historical Provider, MD  acyclovir ointment (ZOVIRAX) 5 % Apply topically every 3 (three) hours. 06/01/13  Yes Christina P Rama, MD  albuterol (PROVENTIL  HFA;VENTOLIN HFA) 108 (90 BASE) MCG/ACT inhaler Inhale 2 puffs into the lungs every 4 (four) hours as needed for wheezing or shortness of breath. 05/22/13  Yes Mirna Mires, MD  aspirin 81 MG tablet Take 81 mg by mouth daily.     Yes Historical Provider, MD  Calcium Carbonate (CALTRATE 600) 1500 MG TABS Take 1 tablet by mouth 2 (two) times daily. chewables   Yes Historical Provider, MD  cyanocobalamin 500 MCG tablet Take 500 mcg by mouth every morning.   Yes Historical Provider, MD  dextromethorphan (DELSYM) 30 MG/5ML liquid Take 10 mLs (60 mg total) by mouth 2 (two) times daily. 06/01/13  Yes Christina P Rama, MD  famotidine (PEPCID) 40 MG tablet Take 40 mg by mouth every morning.   Yes Historical Provider, MD  Multiple Vitamins-Minerals (MULTIVITAMIN WITH MINERALS) tablet Take 1 tablet by mouth every morning. chewables   Yes Historical Provider, MD  omega-3 acid ethyl esters (LOVAZA) 1 G capsule Take 1 g by mouth every morning.   Yes Historical Provider, MD  promethazine (PHENERGAN) 12.5 MG tablet Take 1 tablet (12.5 mg total) by mouth every 6 (six) hours as needed for nausea or vomiting. 05/21/13  Yes Janith Lima, MD  saccharomyces boulardii (FLORASTOR) 250 MG capsule Take 1 capsule (250 mg total) by mouth 2 (two) times daily. 06/01/13  Yes Venetia Maxon Rama, MD  vitamin E 400 UNIT capsule Take 400 Units by mouth every morning.    Yes Historical Provider, MD    Scheduled Meds: . acyclovir ointment   Topical Q3H  . antiseptic oral rinse  15 mL Mouth Rinse BID  . [START ON 06/04/2013] pantoprazole (PROTONIX) IV  40 mg Intravenous Q24H  . saccharomyces boulardii  250 mg Oral BID  . sodium chloride  3 mL Intravenous Q12H   Infusions: . sodium chloride 100 mL/hr at 06/02/13 1731   PRN Meds: acetaminophen, acetaminophen, morphine injection, ondansetron (ZOFRAN) IV, ondansetron   Allergies as of 06/02/2013 - Review Complete 06/02/2013  Allergen Reaction Noted  . Codeine Other (See Comments)     . Levaquin [levofloxacin]  06/01/2013    Family History  Problem Relation Age of Onset  . Heart attack Father     after years of angina  . Coronary artery disease Father   . Coronary artery disease Mother   .  Breast cancer Maternal Aunt   . Cancer Maternal Aunt     breast, colon, ovarian  . Diabetes Brother   . Coronary artery disease Brother   . Hypertension Sister   . Hyperthyroidism Sister   . Coronary artery disease Brother     PCI-stents    History   Social History  . Marital Status: Married    Spouse Name: Constanza Mincy    Number of Children: 2  . Years of Education: 12   Occupational History  . Retired from [employer]  . Smoking status: Never Smoker   . Smokeless tobacco: Never Used  . Alcohol Use: No  . Drug Use: No  . Sexual Activity: Not Currently   Other Topics Concern  . Not on file   Social History Narrative   HSG. Married 1959. 1 son - ''71; 1 dtr - '80; 6 grandchildren (1 set of twins). Marriage is in good health - SO with rectal cancer. Work - retired.     REVIEW OF SYSTEMS: Constitutional:  Per HPI.  Quite active normally, no limitations.  ENT:  No nose bleeds.  Fever blister on upper lip Pulm:  No Dyspnea or cough. CV:  No palpitations, no LE edema.  GU:  No hematuria, no frequency GI:  Per HPI Heme:  No hx anemia or need or iron supplements   Transfusions:  none Neuro:  No headaches, no peripheral tingling or numbness Derm:  No itching, no rash or sores.  Endocrine:  No sweats or chills.  No polyuria or dysuria Immunization:  Flu shot 02/2013.  Pneumovax 02/2013.  Travel:  None beyond local counties in last few months.    PHYSICAL EXAM: Vital signs in last 24 hours: Filed Vitals:   06/03/13 0554  BP: 136/65  Pulse: 96  Temp: 98.3 F (36.8 C)  Resp: 18   Wt Readings from Last 3 Encounters:  06/02/13 49.805 kg (109 lb 12.8 oz)  05/29/13 51.075 kg (112 lb 9.6 oz)  05/20/13 50.803 kg (112 lb)     General: somewhat frail, tired, ill looking.  Head:  No swelling or asymmetry  Eyes:  No icterus or pallor Ears:  Not HOH  Nose:  No congestion or discharge Mouth:  Moist, clear, excellent dental repair Neck:  No mass, no TMG, no bruits or JVD Lungs:  Clear bil.  Unlabored breathing Heart: RRR Abdomen:  Soft, NT, ND, no bruits.  BS hypoactive. .   Rectal: golden colored stool is loose and FOBT negative   Musc/Skeltl: no joint deformity.  Shoulder ROM not tested Extremities:  No CCE  Neurologic:  No tremor, oriented x 3.  No limb weakness grossly. Excellent historian.  Skin:  No telangectasia or rash.  Tattoos:  None  Nodes:  No inguinal adenopathy.    Psych:  Pleasant, relaxed.   Intake/Output from previous day: 01/21 0701 - 01/22 0700 In: -  Out: 200 [Urine:200] Intake/Output this shift:    LAB RESULTS:  Recent Labs  06/02/13 1540 06/03/13 0350  WBC 10.5 8.6  HGB 12.0 11.0*  HCT 35.9* 34.0*  PLT 379 385   BMET Lab Results  Component Value Date   NA 139 06/03/2013   NA 137 06/02/2013   NA 140 05/30/2013   K 4.3 06/03/2013   K 4.3 06/02/2013   K 3.7 05/30/2013   CL 102 06/03/2013   CL 96 06/02/2013   CL 105 05/30/2013   CO2 23 06/03/2013  CO2 23 06/02/2013   CO2 24 05/30/2013   GLUCOSE 86 06/03/2013   GLUCOSE 102* 06/02/2013   GLUCOSE 95 05/30/2013   BUN 10 06/03/2013   BUN 11 06/02/2013   BUN 5* 05/30/2013   CREATININE 0.53 06/03/2013   CREATININE 0.52 06/02/2013   CREATININE 0.58 05/30/2013   CALCIUM 8.7 06/03/2013   CALCIUM 9.4 06/02/2013   CALCIUM 8.1* 05/30/2013   LFT  Recent Labs  06/02/13 1540 06/03/13 0821  PROT 7.1 6.2  ALBUMIN 3.2* 2.7*  AST 25 37  ALT 15 17  ALKPHOS 87 80  BILITOT 0.6 0.5  BILIDIR  --  <0.2  IBILI  --  NOT CALCULATED   PT/INR No results found for this basename: INR,  PROTIME   C-Diff Negative on 03/29/14  Lipase  No results found for this basename: lipase    RADIOLOGY STUDIES: US Abdomen Complete 06/03/2013    FINDINGS: Gallbladder:  No gallstones or wall thickening visualized. No sonographic Murphy sign noted.  Common bile duct:  Diameter: 5.5 mm  Liver:  No focal lesion identified. Within normal limits in parenchymal echogenicity.  IVC:  No abnormality visualized.  Pancreas:  Limited  Spleen:  Size and appearance within normal limits.  Right Kidney:  Length: 9.7 cm. Complex cyst right lower pole measures 17 x 12 mm with a central echogenic area. This is a subtle lesion on CT but no calcification is noted on CT. Echogenicity within normal limits. No mass or hydronephrosis visualized.  Left Kidney:  Length: 10.2 cm. Echogenicity within normal limits. No mass or hydronephrosis visualized.  Abdominal aorta:  No aneurysm visualized.  Other findings:  Left pleural effusion  IMPRESSION: Negative for gallstones.  No biliary duct dilatation.  Small complex cyst left lower pole.   Electronically Signed   By: Franchot Gallo M.D.   On: 06/03/2013 09:48   Ct Abdomen Pelvis W Contrast 06/03/2013     FINDINGS: Lower Chest: Motion degradation. Left greater than right bibasilar atelectasis. Cardiomegaly. Small left pleural effusion. Small hiatal hernia.  Abdomen/Pelvis: No focal liver lesion. Normal spleen. Underdistended proximal stomach. Mild pancreatic atrophy, without pancreatic ductal dilatation  Borderline intrahepatic biliary ductal dilatation. Gallbladder unremarkable. The common duct measures upper normal at 8 mm on coronal image 30. There is mild mucosal hyper enhancement within. The common duct is difficult to follow, secondary to the ampulla being positioned far right of midline. There is no convincing evidence of obstructive stone or mass. There is a periampullary descending duodenal diverticulum. Ill definition of fat planes in the porta hepatis, indeterminate. Example image 26/series 2. Edema or minimal fluid adjacent the inferior genu of the duodenum on image 37/series 2 and coronal image 42.  Normal adrenal glands.  Too small to characterize lower pole right renal lesion. Normal left kidney.  Atherosclerosis at the origin of bilateral renal arteries. Retroaortic left renal vein. No retroperitoneal or retrocrural adenopathy. Scattered colonic diverticula. Normal terminal ileum. Normal small bowel without abdominal ascites.  No pelvic adenopathy. Pelvic floor laxity. Otherwise normal urinary bladder. Hysterectomy. No adnexal mass or significant free fluid.  Bones/Musculoskeletal:  No acute osseous abnormality.  IMPRESSION: 1. Ill definition of porta hepatis fat planes with minimal edema/fluid adjacent the descending duodenum. Concurrent mucosal hyper enhancement within the common duct and borderline intra/extrahepatic biliary ductal dilatation. Appearance is nonspecific. Correlate with liver function tests to exclude otherwise occult choledocholithiasis. Also correlate with laboratory values to exclude pancreatitis or possible duodenitis. Depending on clinical symptoms, ultrasound should be considered. If choledocholithiasis  is a concern, consider MRCP. 2. No other explanation for patient's symptoms. 3. Small left pleural effusion. 4. Pelvic floor laxity.   Electronically Signed   By: Abigail Miyamoto M.D.   On: 06/03/2013 02:09    ENDOSCOPIC STUDIES: 02/2009  COLONOSCOPY by Dr Olevia Perches INDICATIONS: family Hx of polyps last colon 2003   FINDINGS: Moderate diverticulosis was found throughout the colon  (see image2, image1, and image7). A diminutive polyp was found in  the sigmoid colon. 3 mm polyp at 30 cm removed The polyp was  removed using cold biopsy forceps (see image6 and image5). This  was otherwise a normal examination of the colon (see image8,  image4, and image3). Retroflexed views in the rectum revealed no  abnormalities. The scope was then withdrawn from the patient  and the procedure completed.  ENDOSCOPIC IMPRESSION:  1) Moderate diverticulosis throughout the colon  2) Diminutive polyp in the sigmoid colon   3) Otherwise normal examination  RECOMMENDATIONS:  1) Await pathology results  2) high fiber diet  REPEAT EXAM: In 10 year(s) for. Pathology: SIGMOID COLON, POLYP AT 30 CM: TUBULAR ADENOMA. NO HIGH GRADE DYSPLASIA OR MALIGNANCY IDENTIFIED.    IMPRESSION:   *  Dilated biliary tree and porta hepatis edema, rule out choledocholithiasis, rule out obstructing lesion, rule out pancreatitis.  LFTs normal x 2.   *  Prolonged >2 weeks of diarrhea, N/V.  Recently with limited CG emesis. C diff negative.  ? If sxs due to the above biliary/porta hepatis changes, ? Peptic ulcer disease.   *  Left pleural effusion.  No SOB, cough. Completed 6 days of Levaquin for bil infiltrates earlier this month, cough resolved.     PLAN:     *  MRCP.  ? EGD.  *  Lipase level now.    Azucena Freed  06/03/2013, 12:06 PM Pager: [phone] Attending MD note:   I have taken a history, , and reviewed the chart. I agree with the Advanced Practitioner's impression and recommendations. Pt is currently in MRI  So I could not examine her. MRCP pending. She never got over acute gastroenteritis,  Alternative diagnosis - duodenitis, may appear as inflammatory process in porta hepatis.Normal LFT's and ultrasound are against biliary problem.May need EGD  For diagnosis. ( last EGD 2003).  [facility] Pager # [phone]

## 2013-06-03 NOTE — Progress Notes (Signed)
MRCP is normal, no evidence of biliary dilation. Will proceed with EGD tomorrow

## 2013-06-03 NOTE — Progress Notes (Signed)
Patient called RN to room due to diarrhea.  RN saw approx. 100 ccs of dark brown watery stool.

## 2013-06-04 ENCOUNTER — Encounter (HOSPITAL_COMMUNITY)
Admission: AD | Disposition: A | Discharge status: Home / Self Care | Payer: Self-pay | Source: Ambulatory Visit | Attending: Internal Medicine

## 2013-06-04 ENCOUNTER — Encounter (HOSPITAL_COMMUNITY): Payer: Self-pay

## 2013-06-04 DIAGNOSIS — D649 Anemia, unspecified: Secondary | ICD-10-CM

## 2013-06-04 DIAGNOSIS — K264 Chronic or unspecified duodenal ulcer with hemorrhage: Secondary | ICD-10-CM

## 2013-06-04 HISTORY — PX: ESOPHAGOGASTRODUODENOSCOPY: SHX5428

## 2013-06-04 LAB — CBC WITH DIFFERENTIAL/PLATELET
Basophils Absolute: 0.1 10*3/uL (ref 0.0–0.1)
Basophils Relative: 1 % (ref 0–1)
Eosinophils Absolute: 0.1 10*3/uL (ref 0.0–0.7)
Eosinophils Relative: 2 % (ref 0–5)
HCT: 34.3 % — ABNORMAL LOW (ref 36.0–46.0)
Hemoglobin: 11.4 g/dL — ABNORMAL LOW (ref 12.0–15.0)
Lymphocytes Relative: 23 % (ref 12–46)
Lymphs Abs: 1.4 10*3/uL (ref 0.7–4.0)
MCH: 30.7 pg (ref 26.0–34.0)
MCHC: 33.2 g/dL (ref 30.0–36.0)
MCV: 92.5 fL (ref 78.0–100.0)
Monocytes Absolute: 0.7 10*3/uL (ref 0.1–1.0)
Monocytes Relative: 11 % (ref 3–12)
Neutro Abs: 3.9 10*3/uL (ref 1.7–7.7)
Neutrophils Relative %: 63 % (ref 43–77)
Platelets: 347 10*3/uL (ref 150–400)
RBC: 3.71 MIL/uL — ABNORMAL LOW (ref 3.87–5.11)
RDW: 13.2 % (ref 11.5–15.5)
WBC: 6.3 10*3/uL (ref 4.0–10.5)

## 2013-06-04 LAB — BASIC METABOLIC PANEL
BUN: 8 mg/dL (ref 6–23)
CO2: 25 mEq/L (ref 19–32)
Calcium: 8.9 mg/dL (ref 8.4–10.5)
Chloride: 102 mEq/L (ref 96–112)
Creatinine, Ser: 0.57 mg/dL (ref 0.50–1.10)
GFR calc Af Amer: >90 mL/min (ref >90)
GFR calc non Af Amer: 89 mL/min — ABNORMAL LOW (ref >90)
Glucose, Bld: 85 mg/dL (ref 70–99)
Potassium: 4.4 mEq/L (ref 3.7–5.3)
Sodium: 140 mEq/L (ref 137–147)

## 2013-06-04 LAB — OCCULT BLOOD X 1 CARD TO LAB, STOOL: Fecal Occult Bld: NEGATIVE

## 2013-06-04 SURGERY — EGD (ESOPHAGOGASTRODUODENOSCOPY)
Anesthesia: Moderate Sedation

## 2013-06-04 MED ORDER — FENTANYL CITRATE 0.05 MG/ML IJ SOLN
INTRAMUSCULAR | Status: DC | PRN
  Administered 2013-06-04 (×2): 25 ug via INTRAVENOUS

## 2013-06-04 MED ORDER — MORPHINE SULFATE 2 MG/ML IJ SOLN
1.0000 mg | INTRAMUSCULAR | Status: DC | PRN
  Administered 2013-06-05 – 2013-06-11 (×17): 1 mg via INTRAVENOUS
  Filled 2013-06-04 (×17): qty 1

## 2013-06-04 MED ORDER — ACETAMINOPHEN 650 MG RE SUPP: 650.0000 mg | Freq: Four times a day (QID) | RECTAL | Status: DC | PRN

## 2013-06-04 MED ORDER — DEXTROMETHORPHAN POLISTIREX 30 MG/5ML PO LQCR
15.0000 mg | Freq: Two times a day (BID) | ORAL | Status: DC
  Administered 2013-06-04 – 2013-06-07 (×6): 15 mg via ORAL
  Filled 2013-06-04 (×10): qty 5

## 2013-06-04 MED ORDER — ACETAMINOPHEN 325 MG PO TABS
650.0000 mg | ORAL_TABLET | Freq: Four times a day (QID) | ORAL | Status: DC | PRN
  Filled 2013-06-04: qty 2

## 2013-06-04 MED ORDER — SODIUM CHLORIDE 0.9 % IJ SOLN
3.0000 mL | Freq: Two times a day (BID) | INTRAMUSCULAR | Status: DC
  Administered 2013-06-04 – 2013-06-10 (×13): 3 mL via INTRAVENOUS

## 2013-06-04 MED ORDER — ONDANSETRON HCL 4 MG PO TABS
4.0000 mg | ORAL_TABLET | Freq: Four times a day (QID) | ORAL | Status: DC | PRN
  Administered 2013-06-05: 4 mg via ORAL
  Filled 2013-06-04: qty 1

## 2013-06-04 MED ORDER — PANTOPRAZOLE SODIUM 40 MG IV SOLR
40.0000 mg | INTRAVENOUS | Status: DC
  Administered 2013-06-05: 40 mg via INTRAVENOUS
  Filled 2013-06-04 (×2): qty 40

## 2013-06-04 MED ORDER — SACCHAROMYCES BOULARDII 250 MG PO CAPS
250.0000 mg | ORAL_CAPSULE | Freq: Two times a day (BID) | ORAL | Status: DC
  Administered 2013-06-05: 250 mg via ORAL
  Filled 2013-06-04 (×3): qty 1

## 2013-06-04 MED ORDER — FENTANYL CITRATE 0.05 MG/ML IJ SOLN
INTRAMUSCULAR | Status: AC
  Filled 2013-06-04: qty 4

## 2013-06-04 MED ORDER — ACYCLOVIR 5 % EX CREA
1.0000 "application " | TOPICAL_CREAM | Freq: Every day | CUTANEOUS | Status: DC
  Filled 2013-06-04: qty 5

## 2013-06-04 MED ORDER — MIDAZOLAM HCL 10 MG/2ML IJ SOLN
INTRAMUSCULAR | Status: DC | PRN
  Administered 2013-06-04 (×2): 2 mg via INTRAVENOUS

## 2013-06-04 MED ORDER — DIPHENHYDRAMINE HCL 50 MG/ML IJ SOLN
INTRAMUSCULAR | Status: AC
  Filled 2013-06-04: qty 1

## 2013-06-04 MED ORDER — ONDANSETRON HCL 4 MG/2ML IJ SOLN
4.0000 mg | Freq: Four times a day (QID) | INTRAMUSCULAR | Status: DC | PRN
  Administered 2013-06-04 – 2013-06-07 (×3): 4 mg via INTRAVENOUS
  Filled 2013-06-04 (×2): qty 2

## 2013-06-04 MED ORDER — MIDAZOLAM HCL 5 MG/ML IJ SOLN
INTRAMUSCULAR | Status: AC
  Filled 2013-06-04: qty 2

## 2013-06-04 NOTE — Progress Notes (Signed)
Utilization review completed. Breanda Greenlaw, RN, BSN. 

## 2013-06-04 NOTE — Op Note (Addendum)
[facility] [place], [address]   ENDOSCOPY PROCEDURE REPORT  PATIENT: Karla, Roman  MR#: [id number] BIRTHDATE: June 22, 1940 , 73  yrs. old GENDER: Female ENDOSCOPIST: Lafayette Dragon, MD REFERRED BY:  Dr Jerilynn Mages.[place] PROCEDURE DATE:  06/04/2013 PROCEDURE:  EGD w/ biopsy ASA CLASS:     Class II INDICATIONS:  Vomiting.   Nausea.   Hematemesis. MEDICATIONS: These medications were titrated to patient response per physician's verbal order, Versed 4 mg IV, and Fentanyl 50 mcg IV TOPICAL ANESTHETIC: Cetacaine Spray  DESCRIPTION OF PROCEDURE: After the risks benefits and alternatives of the procedure were thoroughly explained, informed consent was obtained.  The Pentax Gastroscope [id number] endoscope was introduced through the mouth and advanced to the second portion of the duodenum. Without limitations.  The instrument was slowly withdrawn as the mucosa was fully examined.      Esophagus[  oximeter and mid esophagus was normal. There was a tiny erosion at the GE junction but there was no stricture Stomach: Gastric mucosa was unremarkable in the body of the stomach and  In the gastric antrum, pyloric outlet was normal . The retroflexion of the endoscope revealed normal fundus and Cardia,  Duodenum:the duodenal bulb was  ulcerated circumferentially, There were  deep  Ulcerations, nodularity and edema  causing partial duodenal outlet obstruction. Endoscope traversed into  descending duodenum with some difficulty. There was bleeding from the Mucosal ulcerations.  or the duodenal The process was consistent with benign disease but malignant nature could not be entirely ruled out.. The descending duodenum was entirely normal. Multiple biopsies were obtained from duodenal bulb.The scope was then withdrawn from the patient and the procedure completed.  COMPLICATIONS: There were no complications. ENDOSCOPIC IMPRESSION: Marked  duodenal bulbar  ulceration, ? Benign, status post biopsies for H. Pylori, may be medication related ulcerations,  rule out peptic or malignant ulcers.. Partial duodenal outlet obstruction, mild grade 1 esophagitis RECOMMENDATIONS:  await biopsy results Bowel rest which includes full liquid Conti PIP REPEAT EXAM: for EGD pending biopsy results.  eSigned:  Lafayette Dragon, MD 06/04/2013 3:45 PM   CC:  PATIENT NAME:  Karla, Roman MR#: [id number]

## 2013-06-04 NOTE — Progress Notes (Signed)
Karla Roman DOB: Jun 21, 1940 DOA: 06/02/2013 PCP: Adella Hare, MD  Brief narrative:  73 year old female, recently discharged 1/20 with gastroenteritis from [facility]-represented to [facility] with coffee-ground emesis, diarrhea with nausea. She carries a past medical history of diverticulosis/icterus, GERD, MVP, anemia, hiatal hernia, Schatzki ring, # humerus 03/18/13, osteopenia,prior fibroid uteri + adenomyosis c Cystorectocoele s/p TAH BSO 2001-please see initial H&P but she had a long and it course over early January resulting in admission to the hospital 1/17 for 6 loose stools and vomiting and was discharged with gastroenteritis. CT scan of the abdomen pelvis showed ill-defined porta hepatis with minimal edema and descending duodenum -hyperenhancement CBD, borderline interim/extrahepatic data the patient Abdominal ultrasound 1/22 = complex cyst right lower of right kidney 17 by 12 mm with central echogenic area LFTs were normal Mildly anemic hemoglobin 11 point, hematocrit 34 point  Past medical history-As per Problem list Chart reviewed as below- reviewed  Consultants:  GI-Dr. Olevia Perches  Procedures:  CT abd pelv  [place] abd  EGD 1/23=Marked duodenal ulceration s/p biospy + duodenal outlet partial obstruction  Antibiotics:  none   Subjective   DOing fair.  Very worried about the renal mass that I told her about yesterday TOl clear liquid diet to some extent Denies CP   Objective    Interim History: none  Telemetry: nsr   Objective: Filed Vitals:   06/04/13 1550 06/04/13 1555 06/04/13 1600 06/04/13 1605  BP: 136/62 132/58 139/60 133/61  Pulse: 90 89 90 95  Temp:      TempSrc:      Resp: 20 19 16 17   SpO2: 97% 97% 96% 97%    Intake/Output Summary (Last 24 hours) at 06/04/13 1625 Last data filed at 06/04/13 0600  Gross per 24 hour  Intake    397 ml  Output      0 ml  Net    397 ml    Exam:  General:  EOMI, NCAT Cardiovascular: s1 s2 no m/r/g Respiratory: clear Abdomen: soft, Slightly tender Lower abd Skinno LE edema Neurointact  Data Reviewed: Basic Metabolic Panel:  Recent Labs Lab 05/29/13 0655 05/29/13 0903 05/30/13 0553 06/02/13 1540 06/03/13 0350 06/04/13 0318  NA 134*  --  140 137 139 140  K 4.4 4.2 3.7 4.3 4.3 4.4  CL 93*  --  105 96 102 102  CO2 22  --  24 23 23 25   GLUCOSE 115*  --  95 102* 86 85  BUN 11  --  5* 11 10 8   CREATININE 0.60  --  0.58 0.52 0.53 0.57  CALCIUM 8.9  --  8.1* 9.4 8.7 8.9  MG  --  2.1  --   --   --   --    Liver Function Tests:  Recent Labs Lab 05/29/13 0655 06/02/13 1540 06/03/13 0821  AST 31 25 37  ALT 20 15 17   ALKPHOS 96 87 80  BILITOT 0.5 0.6 0.5  PROT 7.5 7.1 6.2  ALBUMIN 3.2* 3.2* 2.7*    Recent Labs Lab 06/03/13 0821  LIPASE 22   No results found for this basename: AMMONIA,  in the last 168 hours CBC:  Recent Labs Lab 05/29/13 0655 05/30/13 0553 06/02/13 1540 06/03/13 0350 06/04/13 0318  WBC 12.5* 5.6 10.5 8.6 6.3  NEUTROABS 10.4*  --   --   --  3.9  HGB 12.6 10.4* 12.0 11.0* 11.4*  HCT 36.4 32.0* 35.9* 34.0* 34.3*  MCV 90.8  92.8 93.0 93.9 92.5  PLT 415* 325 379 385 347   Cardiac Enzymes:  Recent Labs Lab 06/02/13 1540 06/02/13 2220 06/03/13 0350  TROPONINI <0.30 <0.30 <0.30   BNP: No components found with this basename: POCBNP,  CBG: No results found for this basename: GLUCAP,  in the last 168 hours  Recent Results (from the past 240 hour(s))  CLOSTRIDIUM DIFFICILE BY PCR     Status: None   Collection Time    05/29/13  1:00 PM      Result Value Range Status   C difficile by pcr NEGATIVE  NEGATIVE Final   Comment: Performed at [facility]     Studies:              All Imaging reviewed and is as per above notation   Scheduled Meds: . acyclovir ointment   Topical Q3H  . antiseptic oral rinse  15 mL Mouth Rinse BID  . dextromethorphan  15 mg Oral BID  . pantoprazole  (PROTONIX) IV  40 mg Intravenous Q24H  . saccharomyces boulardii  250 mg Oral BID  . sodium chloride  3 mL Intravenous Q12H   Continuous Infusions: . sodium chloride 50 mL/hr at 06/03/13 1818  . sodium chloride 20 mL/hr at 06/03/13 2319     Assessment/Plan:  1. Undifferentiated Abd pain c N/v-Lipase 22.  MRCP neg 1/22, EGD-Duuodenal ulcer- PPI, Bowel rest.  Rest per GI.  appreicate greatly input 2. ? GIB- appreciate GI input. Hemoccult stools are neg 3. Anemia-baseline 11-12.  Monitor 4. cyst right lower pole measures 17 x 12 mm kidney-OP eval.  Stable right now   Code Status: Full Family Communication:   Disposition Plan:  Inpatient pending w/u   Verneita Griffes, MD  [facility] Pager [phone] 06/04/2013, 4:25 PM    LOS: 2 days

## 2013-06-04 NOTE — Progress Notes (Signed)
Subjective  " I don't feel good..."   Objective  S/p recent hospitalization for pneumonia and ? Acute gastroenteritis, Having N&V x 2 weeks, passed dark stools but Hgb is stable at 11.4, still loose stools- cultures pending. Vital signs in last 24 hours: Temp:  [98.2 F (36.8 C)-98.5 F (36.9 C)] 98.4 F (36.9 C) (01/23 1610) Pulse Rate:  [90-94] 90 (01/23 0623) Resp:  [20] 20 (01/23 0623) BP: (131-140)/(58-73) 135/58 mmHg (01/23 0623) SpO2:  [95 %-97 %] 95 % (01/23 0623) Last BM Date: 06/03/13 General:    white female in NAD Heart:  Regular rate and rhythm; no murmurs Lungs: Respirations even and unlabored, lungs CTA bilaterally Abdomen:  Soft, tender diffusely but more so in the epigastrium, no rebound, and nondistended. Normal bowel sounds., slightly increased, no ascites Extremities:  Without edema. Neurologic:  Alert and oriented,  grossly normal neurologically. Psych:  Cooperative. Normal mood and affect.  Intake/Output from previous day: 01/22 0701 - 01/23 0700 In: 637 [P.O.:240; I.V.:397] Out: -  Intake/Output this shift:    Lab Results:  Recent Labs  06/02/13 1540 06/03/13 0350 06/04/13 0318  WBC 10.5 8.6 6.3  HGB 12.0 11.0* 11.4*  HCT 35.9* 34.0* 34.3*  PLT 379 385 347   BMET  Recent Labs  06/02/13 1540 06/03/13 0350 06/04/13 0318  NA 137 139 140  K 4.3 4.3 4.4  CL 96 102 102  CO2 23 23 25   GLUCOSE 102* 86 85  BUN 11 10 8   CREATININE 0.52 0.53 0.57  CALCIUM 9.4 8.7 8.9   LFT  Recent Labs  06/03/13 0821  PROT 6.2  ALBUMIN 2.7*  AST 37  ALT 17  ALKPHOS 80  BILITOT 0.5  BILIDIR <0.2  IBILI NOT CALCULATED   PT/INR No results found for this basename: LABPROT, INR,  in the last 72 hours  Studies/Results: US Abdomen Complete  06/03/2013   CLINICAL DATA:  Rule out choledocholithiasis  EXAM: ULTRASOUND ABDOMEN COMPLETE  COMPARISON:  CT 06/03/2013  FINDINGS: Gallbladder:  No gallstones or wall thickening visualized. No sonographic Murphy  sign noted.  Common bile duct:  Diameter: 5.5 mm  Liver:  No focal lesion identified. Within normal limits in parenchymal echogenicity.  IVC:  No abnormality visualized.  Pancreas:  Limited  Spleen:  Size and appearance within normal limits.  Right Kidney:  Length: 9.7 cm. Complex cyst right lower pole measures 17 x 12 mm with a central echogenic area. This is a subtle lesion on CT but no calcification is noted on CT. Echogenicity within normal limits. No mass or hydronephrosis visualized.  Left Kidney:  Length: 10.2 cm. Echogenicity within normal limits. No mass or hydronephrosis visualized.  Abdominal aorta:  No aneurysm visualized.  Other findings:  Left pleural effusion  IMPRESSION: Negative for gallstones.  No biliary duct dilatation.  Small complex cyst left lower pole.   Electronically Signed   By: Franchot Gallo M.D.   On: 06/03/2013 09:48   Ct Abdomen Pelvis W Contrast  06/03/2013   CLINICAL DATA:  Nausea and vomiting.  Abdominal pain.  EXAM: CT ABDOMEN AND PELVIS WITH CONTRAST  TECHNIQUE: Multidetector CT imaging of the abdomen and pelvis was performed using the standard protocol following bolus administration of intravenous contrast.  CONTRAST:  159mL OMNIPAQUE IOHEXOL 300 MG/ML  SOLN  COMPARISON:  DG ABDOMEN 1V dated 05/29/2013  FINDINGS: Lower Chest: Motion degradation. Left greater than right bibasilar atelectasis. Cardiomegaly. Small left pleural effusion. Small hiatal hernia.  Abdomen/Pelvis: No focal liver  lesion. Normal spleen. Underdistended proximal stomach. Mild pancreatic atrophy, without pancreatic ductal dilatation  Borderline intrahepatic biliary ductal dilatation. Gallbladder unremarkable. The common duct measures upper normal at 8 mm on coronal image 30. There is mild mucosal hyper enhancement within. The common duct is difficult to follow, secondary to the ampulla being positioned far right of midline. There is no convincing evidence of obstructive stone or mass. There is a  periampullary descending duodenal diverticulum. Ill definition of fat planes in the porta hepatis, indeterminate. Example image 26/series 2. Edema or minimal fluid adjacent the inferior genu of the duodenum on image 37/series 2 and coronal image 42.  Normal adrenal glands. Too small to characterize lower pole right renal lesion. Normal left kidney.  Atherosclerosis at the origin of bilateral renal arteries. Retroaortic left renal vein. No retroperitoneal or retrocrural adenopathy. Scattered colonic diverticula. Normal terminal ileum. Normal small bowel without abdominal ascites.  No pelvic adenopathy. Pelvic floor laxity. Otherwise normal urinary bladder. Hysterectomy. No adnexal mass or significant free fluid.  Bones/Musculoskeletal:  No acute osseous abnormality.  IMPRESSION: 1. Ill definition of porta hepatis fat planes with minimal edema/fluid adjacent the descending duodenum. Concurrent mucosal hyper enhancement within the common duct and borderline intra/extrahepatic biliary ductal dilatation. Appearance is nonspecific. Correlate with liver function tests to exclude otherwise occult choledocholithiasis. Also correlate with laboratory values to exclude pancreatitis or possible duodenitis. Depending on clinical symptoms, ultrasound should be considered. If choledocholithiasis is a concern, consider MRCP. 2. No other explanation for patient's symptoms. 3. Small left pleural effusion. 4. Pelvic floor laxity.   Electronically Signed   By: Abigail Miyamoto M.D.   On: 06/03/2013 02:09   Mr Lambert Mody Cm/mrcp  06/03/2013   CLINICAL DATA:  Abdominal pain with vomiting and diarrhea. Treated for gastroenteritis.  EXAM: MRI ABDOMEN WITHOUT  (INCLUDING MRCP)  TECHNIQUE: Multiplanar multisequence MR imaging of the abdomen was performed. Heavily T2-weighted images of the biliary and pancreatic ducts were obtained, and three-dimensional MRCP images were rendered by post processing.  COMPARISON:  US ABDOMEN COMPLETE dated 06/03/2013;  MR L SPINE W/O dated 08/26/2006; CT ABD/PELVIS W CM dated 06/03/2013  FINDINGS: Examination is mildly motion degraded, especially on the thin-section T2 weighted MRCP images. The gallbladder appears normal. There is no evidence of gallstones or gallbladder wall thickening. There is no intra or extrahepatic biliary dilatation. There is no evidence of choledocholithiasis.  There is no evidence of pancreatic mass, focal surrounding fluid collection or pancreatic ductal dilatation. The pancreatic duct is not well visualized, although there is no definite evidence of pancreas divisum. The pancreas enhances normally following contrast.  Better seen on the recent CT is wall thickening of the distal stomach and proximal duodenum with surrounding inflammatory change. There is a periampullary duodenum diverticulum.  The liver, spleen and adrenal glands appear normal. There is a small right renal cyst. No hydronephrosis.  Left pleural effusion and adjacent left lower lobe airspace disease/atelectasis are noted.  IMPRESSION: 1. No evidence of biliary dilatation or choledocholithiasis. 2. Right upper quadrant inflammatory process with epicenter on the proximal duodenum, suggesting duodenitis or peptic ulcer disease. No evidence of pancreatitis or focal fluid collection. 3. Left pleural effusion with adjacent left lower lobe atelectasis or infiltrate.   Electronically Signed   By: Camie Patience M.D.   On: 06/03/2013 18:56       Assessment / Plan:    Dilated biliary tree and porta hepatis edema, rule out choledocholithiasis, rule out obstructing lesion, rule out pancreatitis., normal MRCP,  LFTs normal x 2.  * Prolonged >2 weeks of diarrhea, N/V. Recently with limited CG emesis. C diff negative. ? If sxs due to the above biliary/porta hepatis changes, ? Peptic ulcer disease.  * Left pleural effusion. No SOB, cough. Completed 6 days of Levaquin for bil infiltrates earlier this month, cough resolved.  I am leaning toward  acute gastritis/ PUD, will proceed with EGD today 1.30 mp, discussed with the pt  Principal Problem:   Coffee ground emesis Active Problems:   GERD   CHEST PAIN   Diarrhea   Anemia   Dehydration   Nausea and vomiting   Nonspecific (abnormal) findings on radiological and other examination of biliary tract     LOS: 2 days   Delfin Edis  06/04/2013, 7:28 AM

## 2013-06-05 DIAGNOSIS — R112 Nausea with vomiting, unspecified: Secondary | ICD-10-CM

## 2013-06-05 MED ORDER — PANTOPRAZOLE SODIUM 40 MG IV SOLR
40.0000 mg | Freq: Two times a day (BID) | INTRAVENOUS | Status: DC
  Administered 2013-06-05 – 2013-06-06 (×2): 40 mg via INTRAVENOUS
  Filled 2013-06-05 (×2): qty 40

## 2013-06-05 NOTE — Progress Notes (Signed)
Daily Rounding Note  06/05/2013, 10:03 AM  LOS: 3 days   SUBJECTIVE:       Less nausea, still with brown diarrhea.  Appetite poor.   OBJECTIVE:         Vital signs in last 24 hours:    Temp:  [97.8 F (36.6 C)-98.7 F (37.1 C)] 97.8 F (36.6 C) (01/24 0524) Pulse Rate:  [70-103] 87 (01/24 0524) Resp:  [13-24] 20 (01/24 0524) BP: (121-189)/(55-84) 121/55 mmHg (01/24 0524) SpO2:  [91 %-98 %] 93 % (01/24 0524) Weight:  [51.07 kg (112 lb 9.4 oz)] 51.07 kg (112 lb 9.4 oz) (01/23 2127) Last BM Date: 06/05/13 General: looks frail and ill.  comfortable   Heart: rrr Chest: clear bil Abdomen: soft, slight upper belly tenderness no guard or rebound.    Extremities: no CCE Neuro/Psych:  Pleasant, oriented x 3.  Relaxed.   Intake/Output from previous day: 01/23 0701 - 01/24 0700 In: 3 [I.V.:3] Out: -   Intake/Output this shift:    Lab Results:  Recent Labs  06/02/13 1540 06/03/13 0350 06/04/13 0318  WBC 10.5 8.6 6.3  HGB 12.0 11.0* 11.4*  HCT 35.9* 34.0* 34.3*  PLT 379 385 347   BMET  Recent Labs  06/02/13 1540 06/03/13 0350 06/04/13 0318  NA 137 139 140  K 4.3 4.3 4.4  CL 96 102 102  CO2 23 23 25   GLUCOSE 102* 86 85  BUN 11 10 8   CREATININE 0.52 0.53 0.57  CALCIUM 9.4 8.7 8.9   LFT  Recent Labs  06/02/13 1540 06/03/13 0821  PROT 7.1 6.2  ALBUMIN 3.2* 2.7*  AST 25 37  ALT 15 17  ALKPHOS 87 80  BILITOT 0.6 0.5  BILIDIR  --  <0.2  IBILI  --  NOT CALCULATED   PT/INR No results found for this basename: LABPROT, INR,  in the last 72 hours Hepatitis Panel No results found for this basename: HEPBSAG, HCVAB, HEPAIGM, HEPBIGM,  in the last 72 hours  Studies/Results: Mr Lambert Mody Cm/mrcp  06/03/2013   CLINICAL DATA:  Abdominal pain with vomiting and diarrhea. Treated for gastroenteritis.  EXAM: MRI ABDOMEN WITHOUT  (INCLUDING MRCP)  TECHNIQUE: Multiplanar multisequence MR imaging of the abdomen was  performed. Heavily T2-weighted images of the biliary and pancreatic ducts were obtained, and three-dimensional MRCP images were rendered by post processing.  COMPARISON:  US ABDOMEN COMPLETE dated 06/03/2013; MR L SPINE W/O dated 08/26/2006; CT ABD/PELVIS W CM dated 06/03/2013  FINDINGS: Examination is mildly motion degraded, especially on the thin-section T2 weighted MRCP images. The gallbladder appears normal. There is no evidence of gallstones or gallbladder wall thickening. There is no intra or extrahepatic biliary dilatation. There is no evidence of choledocholithiasis.  There is no evidence of pancreatic mass, focal surrounding fluid collection or pancreatic ductal dilatation. The pancreatic duct is not well visualized, although there is no definite evidence of pancreas divisum. The pancreas enhances normally following contrast.  Better seen on the recent CT is wall thickening of the distal stomach and proximal duodenum with surrounding inflammatory change. There is a periampullary duodenum diverticulum.  The liver, spleen and adrenal glands appear normal. There is a small right renal cyst. No hydronephrosis.  Left pleural effusion and adjacent left lower lobe airspace disease/atelectasis are noted.  IMPRESSION: 1. No evidence of biliary dilatation or choledocholithiasis. 2. Right upper quadrant inflammatory process with epicenter on the proximal duodenum, suggesting duodenitis or peptic ulcer disease. No  evidence of pancreatitis or focal fluid collection. 3. Left pleural effusion with adjacent left lower lobe atelectasis or infiltrate.   Electronically Signed   By: Camie Patience M.D.   On: 06/03/2013 18:56   . acyclovir cream  1 application Topical 5 X Daily  . dextromethorphan  15 mg Oral BID  . pantoprazole (PROTONIX) IV  40 mg Intravenous Q24H  . saccharomyces boulardii  250 mg Oral BID  . sodium chloride  3 mL Intravenous Q12H   ASSESMENT:   * Prolonged >2 weeks of diarrhea, N/V. Recently with  limited CG emesis. C diff negative.  egd 1/23 with marked duodenal ulceration and partial outlet obstruction.  On pepcid hs at home, now on IV protonix.   *  Diarrhea.  c diff negative on 1/17.   * Dilated biliary tree and porta hepatis edema.  Likely due to the extensive duodenal ulcers.  LFTs normal repeatedly.    * Left pleural effusion. No SOB, cough. Completed 6 days of Levaquin for bil infiltrates earlier this month, cough resolved.     PLAN   *  Leave on fulls for now.  *  Will increase Protonix IV to bid,    Karla Roman  06/05/2013, 10:03 AM Pager: [phone]  GI ATTENDING  Patient personally seen and examined. Interval data personally reviewed. Agree with history, physical, assessment, and plans as outlined. Patient's husband in room. Overall feeling better, tolerating liquids. I reviewed with her the endoscopic findings and implications as well as treatment plan. Had been taking daily aspirin at home. Occasional NSAID. Was not on PPI, only H2 receptor antagonist. H. pylori status pending. Would check antibody if CLO test negative. Not sure why she's having diarrhea, but it has improved some. Advance diet as tolerated. Would need to go home on twice a day PPI with appropriate GI followup.  Docia Chuck. Geri Seminole., M.D. [facility] Division of Gastroenterology

## 2013-06-05 NOTE — Progress Notes (Signed)
Karla Roman ZWC:[id number] DOB: 1940-08-27 DOA: 06/02/2013 PCP: Adella Hare, MD  Brief narrative:  73 year old female, recently discharged 1/20 with gastroenteritis from [facility]-represented to [facility] with coffee-ground emesis, diarrhea with nausea. She carries a past medical history of diverticulosis/icterus, GERD, MVP, anemia, hiatal hernia, Schatzki ring, # humerus 03/18/13, osteopenia,prior fibroid uteri + adenomyosis c Cystorectocoele s/p TAH BSO 2001-please see initial H&P but she had a long and it course over early January resulting in admission to the hospital 1/17 for 6 loose stools and vomiting and was discharged with gastroenteritis. CT scan of the abdomen pelvis showed ill-defined porta hepatis with minimal edema and descending duodenum -hyperenhancement CBD, borderline interim/extrahepatic data the patient Abdominal ultrasound 1/22 = complex cyst right lower of right kidney 17 by 12 mm with central echogenic area LFTs were normal Mildly anemic hemoglobin 11 point, hematocrit 34 point  Past medical history-As per Problem list Chart reviewed as below- reviewed  Consultants:  GI-Dr. Olevia Perches  Procedures:  CT abd pelv  [place] abd  EGD 1/23=Marked duodenal ulceration s/p biospy + duodenal outlet partial obstruction  Antibiotics:  none   Subjective   DOing fair.  Continues to perseverate about the renal mass/? stomach cancer Her friend diagnosed with stomahc cancer some time ago TOl clear liquid diet to some extent Denies CP   Objective    Interim History: none  Telemetry: nsr   Objective: Filed Vitals:   06/04/13 1605 06/04/13 2127 06/05/13 0524 06/05/13 1430  BP: 133/61 131/56 121/55 101/52  Pulse: 95 91 87 93  Temp:  98.2 F (36.8 C) 97.8 F (36.6 C) 98.1 F (36.7 C)  TempSrc:  Oral Oral Oral  Resp: 17 20 20 17   Height:  5\' 2"  (1.575 m)    Weight:  51.07 kg (112 lb 9.4 oz)    SpO2: 97% 94% 93% 94%     Intake/Output Summary (Last 24 hours) at 06/05/13 1454 Last data filed at 06/04/13 2227  Gross per 24 hour  Intake      3 ml  Output      0 ml  Net      3 ml    Exam:  General: EOMI, NCAT Cardiovascular: s1 s2 no m/r/g Respiratory: clear Abdomen: soft, Slightly tender epigastrium Skinno LE edema Neurointact  Data Reviewed: Basic Metabolic Panel:  Recent Labs Lab 05/30/13 0553 06/02/13 1540 06/03/13 0350 06/04/13 0318  NA 140 137 139 140  K 3.7 4.3 4.3 4.4  CL 105 96 102 102  CO2 24 23 23 25   GLUCOSE 95 102* 86 85  BUN 5* 11 10 8   CREATININE 0.58 0.52 0.53 0.57  CALCIUM 8.1* 9.4 8.7 8.9   Liver Function Tests:  Recent Labs Lab 06/02/13 1540 06/03/13 0821  AST 25 37  ALT 15 17  ALKPHOS 87 80  BILITOT 0.6 0.5  PROT 7.1 6.2  ALBUMIN 3.2* 2.7*    Recent Labs Lab 06/03/13 0821  LIPASE 22   No results found for this basename: AMMONIA,  in the last 168 hours CBC:  Recent Labs Lab 05/30/13 0553 06/02/13 1540 06/03/13 0350 06/04/13 0318  WBC 5.6 10.5 8.6 6.3  NEUTROABS  --   --   --  3.9  HGB 10.4* 12.0 11.0* 11.4*  HCT 32.0* 35.9* 34.0* 34.3*  MCV 92.8 93.0 93.9 92.5  PLT 325 379 385 347   Cardiac Enzymes:  Recent Labs Lab 06/02/13 1540 06/02/13 2220 06/03/13 0350  TROPONINI <0.30 <0.30 <0.30  BNP: No components found with this basename: POCBNP,  CBG: No results found for this basename: GLUCAP,  in the last 168 hours  Recent Results (from the past 240 hour(s))  CLOSTRIDIUM DIFFICILE BY PCR     Status: None   Collection Time    05/29/13  1:00 PM      Result Value Range Status   C difficile by pcr NEGATIVE  NEGATIVE Final   Comment: Performed at [facility]     Studies:              All Imaging reviewed and is as per above notation   Scheduled Meds: . acyclovir cream  1 application Topical 5 X Daily  . dextromethorphan  15 mg Oral BID  . pantoprazole (PROTONIX) IV  40 mg Intravenous Q12H  . sodium chloride  3 mL  Intravenous Q12H   Continuous Infusions:     Assessment/Plan:  1. Duodenal ulcer c N/v-Lipase 22.  MRCP neg 1/22, EGD-Duuodenal ulcer- PPI, Bowel rest.  Rest per GI.   2. ? GIB- appreciate GI input. Hemoccult stools are neg 3. Anemia-baseline 11-12.  Monitor 4. cyst right lower pole measures 17 x 12 mm kidney-OP eval.  Stable right now-Spoke with Dr. Janice Norrie 1/23-Could ge tOP MRI kidney c contrast to better characterise-agrees to see her in office.   Code Status: Full Family Communication:  Husband updated at bedside Disposition Plan:  Inpatient pending w/u   Verneita Griffes, MD  [facility] Pager [phone] 06/05/2013, 2:54 PM    LOS: 3 days

## 2013-06-06 DIAGNOSIS — R932 Abnormal findings on diagnostic imaging of liver and biliary tract: Secondary | ICD-10-CM

## 2013-06-06 MED ORDER — PANTOPRAZOLE SODIUM 40 MG PO TBEC
40.0000 mg | DELAYED_RELEASE_TABLET | Freq: Two times a day (BID) | ORAL | Status: DC
  Administered 2013-06-06 – 2013-06-11 (×10): 40 mg via ORAL
  Filled 2013-06-06 (×9): qty 1

## 2013-06-06 MED ORDER — PANTOPRAZOLE SODIUM 40 MG PO TBEC: 40.0000 mg | DELAYED_RELEASE_TABLET | Freq: Every day | ORAL | Status: DC

## 2013-06-06 NOTE — Progress Notes (Signed)
          Daily Rounding Note  06/06/2013, 9:38 AM  LOS: 4 days   SUBJECTIVE:       One loose stool this AM, 3 yesterday.  No nausea or abdominal pain.  Not dizzy, feeling stronger  OBJECTIVE:         Vital signs in last 24 hours:    Temp:  [98.1 F (36.7 C)-98.5 F (36.9 C)] 98.5 F (36.9 C) (01/25 0549) Pulse Rate:  [89-93] 89 (01/25 0549) Resp:  [17] 17 (01/25 0549) BP: (101-116)/(42-52) 116/47 mmHg (01/25 0549) SpO2:  [94 %-95 %] 94 % (01/25 0549) Last BM Date: 06/05/13 General: still looks frail, pale   Heart: RRR Chest: clear.  Breathing unlabored Abdomen: soft, minor tenderness mid-abdomen.  Active BS  Extremities: no CCE Neuro/Psych:  Oriented x 3, relaxed, no limb weakness.   Intake/Output from previous day:    Intake/Output this shift: Total I/O In: 360 [P.O.:360] Out: -   Lab Results:  Recent Labs  06/04/13 0318  WBC 6.3  HGB 11.4*  HCT 34.3*  PLT 347   BMET  Recent Labs  06/04/13 0318  NA 140  K 4.4  CL 102  CO2 25  GLUCOSE 85  BUN 8  CREATININE 0.57  CALCIUM 8.9    ASSESMENT:   * Prolonged >2 weeks of diarrhea, N/V. Recently limited CG emesis. C diff negative.  egd 1/23 with marked duodenal ulceration and partial outlet obstruction.  On pepcid hs at home, now on IV protonix.  * Diarrhea. c diff negative on 1/17.  * Dilated biliary tree and porta hepatis edema. Likely due to the extensive duodenal ulcers.  LFTs normal repeatedly.  * Left pleural effusion. No SOB, cough. Completed 6 days of Levaquin for bil infiltrates earlier this month, cough resolved.     PLAN   *  Serum Gastrin level and Serum H Pylori Ab.  *  Solid diet. cange to oral BID Protonix.     Azucena Freed  06/06/2013, 9:38 AM Pager: [phone]  GI ATTENDING  Patient personally seen and examined. Interval data reviewed. Agree with H&P as outlined above. Patient is tolerating her diet. Only complains of belching.  Some loose stools, but much better than it has been previous. We will check H. pylori status given her duodenal ulcer disease. As well, check gastrin level. Advancing diet. Hopefully he can go home tomorrow with appropriate GI followup  Docia Chuck. Geri Seminole., M.D. [facility] Division of Gastroenterology

## 2013-06-06 NOTE — Progress Notes (Addendum)
Karla Roman RCV:[id number] DOB: September 02, 1940 DOA: 06/02/2013 PCP: Adella Hare, MD  Brief narrative:  73 year old female, recently discharged 1/20 with gastroenteritis from [place] [facility]-represented to [facility] emergency room with coffee-ground emesis, diarrhea with nausea. She carries a past medical history of diverticulosis/icterus, GERD, MVP, anemia, hiatal hernia, Schatzki ring, # humerus 03/18/13, osteopenia,prior fibroid uteri + adenomyosis c Cystorectocoele s/p TAH BSO 2001-please see initial H&P but she had a long and it course over early January resulting in admission to the hospital 1/17 for 6 loose stools and vomiting and was discharged with gastroenteritis. CT scan of the abdomen pelvis showed ill-defined porta hepatis with minimal edema and descending duodenum -hyperenhancement CBD, borderline interim/extrahepatic data the patient Abdominal ultrasound 1/22 = complex cyst right lower of right kidney 17 by 12 mm with central echogenic area LFTs were normal Mildly anemic hemoglobin 11 point, hematocrit 34 point     Past medical history-As per Problem list Chart reviewed as below- reviewed  Consultants:  GI-Dr. Olevia Perches  Procedures:  CT abd pelv  [place] abd  EGD 1/23=Marked duodenal ulceration s/p biospy + duodenal outlet partial obstruction  Antibiotics:  none   Subjective   doing okay.  Had 6 loose stools.  CDIFF again ruled out.  Diarrhea chronic. Tol diet Eating and drinking ~40% of meal No fever,  No chills No h/o Nephrolithiasis No dysuria     Objective    Interim History: none  Telemetry: nsr   Objective: Filed Vitals:   06/05/13 0524 06/05/13 1430 06/05/13 2217 06/06/13 0549  BP: 121/55 101/52 115/42 116/47  Pulse: 87 93 89 89  Temp: 97.8 F (36.6 C) 98.1 F (36.7 C) 98.4 F (36.9 C) 98.5 F (36.9 C)  TempSrc: Oral Oral Oral Oral  Resp: 20 17 17 17   Height:      Weight:      SpO2: 93% 94% 95% 94%    Intake/Output  Summary (Last 24 hours) at 06/06/13 1505 Last data filed at 06/06/13 1359  Gross per 24 hour  Intake    600 ml  Output      0 ml  Net    600 ml    Exam:  General: EOMI, NCAT Cardiovascular: s1 s2 no m/r/g Respiratory: clear Abdomen: soft, Slightly tender epigastrium Skin: no LE edema MSK: Paraspinal tenderness L side, Axial rotation neg.   Neurointact  Data Reviewed: Basic Metabolic Panel:  Recent Labs Lab 06/02/13 1540 06/03/13 0350 06/04/13 0318  NA 137 139 140  K 4.3 4.3 4.4  CL 96 102 102  CO2 23 23 25   GLUCOSE 102* 86 85  BUN 11 10 8   CREATININE 0.52 0.53 0.57  CALCIUM 9.4 8.7 8.9   Liver Function Tests:  Recent Labs Lab 06/02/13 1540 06/03/13 0821  AST 25 37  ALT 15 17  ALKPHOS 87 80  BILITOT 0.6 0.5  PROT 7.1 6.2  ALBUMIN 3.2* 2.7*    Recent Labs Lab 06/03/13 0821  LIPASE 22   No results found for this basename: AMMONIA,  in the last 168 hours CBC:  Recent Labs Lab 06/02/13 1540 06/03/13 0350 06/04/13 0318  WBC 10.5 8.6 6.3  NEUTROABS  --   --  3.9  HGB 12.0 11.0* 11.4*  HCT 35.9* 34.0* 34.3*  MCV 93.0 93.9 92.5  PLT 379 385 347   Cardiac Enzymes:  Recent Labs Lab 06/02/13 1540 06/02/13 2220 06/03/13 0350  TROPONINI <0.30 <0.30 <0.30   BNP: No components found with this basename: POCBNP,  CBG: No results found for this basename: GLUCAP,  in the last 168 hours  Recent Results (from the past 240 hour(s))  CLOSTRIDIUM DIFFICILE BY PCR     Status: None   Collection Time    05/29/13  1:00 PM      Result Value Range Status   C difficile by pcr NEGATIVE  NEGATIVE Final   Comment: Performed at [facility]     Studies:              All Imaging reviewed and is as per above notation   Scheduled Meds: . acyclovir cream  1 application Topical 5 X Daily  . dextromethorphan  15 mg Oral BID  . pantoprazole  40 mg Oral BID  . sodium chloride  3 mL Intravenous Q12H   Continuous Infusions:      Assessment/Plan:  1. Back pain-probably MSK.  Flexeril trial.  Get UA, CXR-unlikely infectious etiology-just received course of Rx c levaquin for PNa   2. Duodenal ulcer c N/v-Lipase 22.  MRCP neg 1/22, EGD-Duuodenal ulcer- PPI, Bowel rest.  Await Gastrin levels, H. Pylori IgG <0.40, path from Biopsy pending- rest per GI.  3. Diarrhea-Unclear etiology-if persists,Repeat CDIFF PCR is neg.  Further work-upper GI 4. GIB- appreciate GI input. Hemoccult stools are neg 5. Anemia-baseline 11-12.  Monitor 6. cyst right lower pole measures 17 x 12 mm kidney-OP eval.  Stable right now-Spoke with Dr. Janice Norrie 1/23-Could get OP MRI kidney c contrast to better characterise-agrees to see her in office.   Code Status: Full Family Communication:  Husband updated at bedside Disposition Plan:  Inpatient pending w/u   Verneita Griffes, MD  [facility] Pager [phone] 06/06/2013, 3:05 PM    LOS: 4 days

## 2013-06-07 ENCOUNTER — Encounter (HOSPITAL_COMMUNITY): Payer: Self-pay | Admitting: Internal Medicine

## 2013-06-07 ENCOUNTER — Inpatient Hospital Stay (HOSPITAL_COMMUNITY): Payer: Medicare Other

## 2013-06-07 LAB — GI PATHOGEN PANEL BY PCR, STOOL
C difficile toxin A/B: NEGATIVE
Campylobacter by PCR: NEGATIVE
Cryptosporidium by PCR: NEGATIVE
E coli (ETEC) LT/ST: NEGATIVE
E coli (STEC): NEGATIVE
E coli 0157 by PCR: NEGATIVE
G lamblia by PCR: NEGATIVE
Norovirus GI/GII: NEGATIVE
Rotavirus A by PCR: NEGATIVE
Salmonella by PCR: NEGATIVE
Shigella by PCR: NEGATIVE

## 2013-06-07 LAB — URINALYSIS, DIPSTICK ONLY
Bilirubin Urine: NEGATIVE
Glucose, UA: NEGATIVE mg/dL
Hgb urine dipstick: NEGATIVE
Ketones, ur: 15 mg/dL — AB
Nitrite: NEGATIVE
Protein, ur: NEGATIVE mg/dL
Specific Gravity, Urine: 1.014 (ref 1.005–1.030)
Urobilinogen, UA: 0.2 mg/dL (ref 0.0–1.0)
pH: 6 (ref 5.0–8.0)

## 2013-06-07 LAB — H. PYLORI ANTIBODY, IGG: H Pylori IgG: <0.4 {ISR}

## 2013-06-07 LAB — CLOSTRIDIUM DIFFICILE BY PCR: Toxigenic C. Difficile by PCR: NEGATIVE

## 2013-06-07 MED ORDER — LOPERAMIDE HCL 2 MG PO CAPS
2.0000 mg | ORAL_CAPSULE | ORAL | Status: DC | PRN
Start: 2013-06-07 — End: 2013-06-11

## 2013-06-07 MED ORDER — CYCLOBENZAPRINE HCL 5 MG PO TABS
5.0000 mg | ORAL_TABLET | Freq: Once | ORAL | Status: AC
  Administered 2013-06-07: 5 mg via ORAL
  Filled 2013-06-07: qty 1

## 2013-06-07 NOTE — Progress Notes (Signed)
Pt c/o back pain on left lower side. Gets worse with cough and deep breaths. Rates pain 8 of 10

## 2013-06-07 NOTE — Clinical Documentation Improvement (Signed)
Possible Clinical Conditions   - Severe Malnutrition   - Other Condition   - Unable to Clinically Determine     Supporting Information  Registered Dietician Consult dated 06/03/2013 at 2:58 pm   Thank You,  Erling Conte ,RN BSN CCDS Certified Clinical Documentation Specialist:  [phone] [place] Information Management

## 2013-06-07 NOTE — Progress Notes (Signed)
          Daily Rounding Note  06/07/2013, 10:48 AM  LOS: 5 days   SUBJECTIVE:       On regular diet. Not eating much but no nausea.  6 loose stools yesterday, none so far today Intense left thoracic/scapular back pain, exacerbated by cough and deep breathing.  Started this morning.   Still coughing, not a lot of sputum.   OBJECTIVE:         Vital signs in last 24 hours:    Temp:  [98.1 F (36.7 C)-98.7 F (37.1 C)] 98.1 F (36.7 C) (01/26 0620) Pulse Rate:  [90-101] 101 (01/25 2205) Resp:  [16-20] 20 (01/26 0620) BP: (108-126)/(52-60) 113/52 mmHg (01/26 0620) SpO2:  [94 %-95 %] 94 % (01/26 0620) Last BM Date: 06/06/13 General: looks ill, pleasant and comfortable   Heart: RRR Chest: clear bil.  + cough.  No dyspnea.  No CVA tenderness. Abdomen: soft, minor epigastric tenderness.  Active BS, ND   Extremities: no CCE Neuro/Psych:  Pleasant, no confusion, no tremor.  No limb weakness.   Intake/Output from previous day: 01/25 0701 - 01/26 0700 In: 840 [P.O.:840] Out: -   Intake/Output this shift:    Lab Results: None new  Pending:  Gastrin and serum H Pylori level  ASSESMENT:   * Prolonged >2 weeks of diarrhea, N/V. Recently limited CG emesis. C diff negative.  egd 1/23 with marked duodenal ulceration and partial outlet obstruction.  On pepcid hs at home, BID oral Protonix now.  In some pt's Protonix causes diarrhea.   Gastrin and serum H Pylori level in process.  * Diarrhea. c diff negative on 1/17.  * Dilated biliary tree and porta hepatis edema. Likely due to the extensive duodenal ulcers.  LFTs normal repeatedly. Today having pleuritic left thoracic pain.  * Improving bil lung infiltrates on 1/17 CXR. No SOB, cough. Completed 6 days of Levaquin for bil infiltrates earlier this month, cough resolved.      PLAN   *  Add prn Imodium.  *  Await serum Gastrin and H Pylori.    Azucena Freed  06/07/2013, 10:48  AM Pager: [phone]   -GI ATTENDING  Interval history and data reviewed. Patient personally seen and examined. Agree with H&P as outlined above. He seems to be doing well from a GI standpoint. Tolerating diet. No diarrhea today. Chief complaint is back pain for which she was given a muscle relaxant. Recommend that she continue on a twice a day PPI. Imodium as needed for diarrhea. Outpatient followup with Dr. Delfin Edis 2 weeks after hospital discharge. We're available as needed. Discussed plans with patient. Will sign off.  Docia Chuck. Geri Seminole., M.D. [facility] Division of Gastroenterology

## 2013-06-08 ENCOUNTER — Inpatient Hospital Stay (HOSPITAL_COMMUNITY): Payer: Medicare Other

## 2013-06-08 MED ORDER — TRAMADOL HCL 50 MG PO TABS
100.0000 mg | ORAL_TABLET | Freq: Four times a day (QID) | ORAL | Status: DC | PRN
  Administered 2013-06-08 – 2013-06-11 (×6): 100 mg via ORAL
  Filled 2013-06-08 (×6): qty 2

## 2013-06-08 MED ORDER — ENSURE COMPLETE PO LIQD
237.0000 mL | Freq: Two times a day (BID) | ORAL | Status: DC
  Administered 2013-06-08 – 2013-06-11 (×6): 237 mL via ORAL

## 2013-06-08 NOTE — Progress Notes (Addendum)
Karla Roman ZOX:[id number] DOB: 02/15/1941 DOA: 06/02/2013 PCP: Adella Hare, MD  Brief narrative:  73 year old female, recently discharged 1/20 with gastroenteritis from [place] [facility]-represented to [facility] emergency room with coffee-ground emesis, diarrhea with nausea. She carries a past medical history of diverticulosis/icterus, GERD, MVP, anemia, hiatal hernia, Schatzki ring, # humerus 03/18/13, osteopenia,prior fibroid uteri + adenomyosis c Cystorectocoele s/p TAH BSO 2001-please see initial H&P but she had a long and it course over early January resulting in admission to the hospital 1/17 for 6 loose stools and vomiting and was discharged with gastroenteritis. CT scan of the abdomen pelvis showed ill-defined porta hepatis with minimal edema and descending duodenum -hyperenhancement CBD, borderline interim/extrahepatic data the patient Abdominal ultrasound 1/22 = complex cyst right lower of right kidney 17 by 12 mm with central echogenic area LFTs were normal Mildly anemic hemoglobin 11 point, hematocrit 34 point     Past medical history-As per Problem list Chart reviewed as below- reviewed  Consultants:  GI-Dr. Olevia Perches  Procedures:  CT abd pelv  [place] abd  EGD 1/23=Marked duodenal ulceration s/p biospy + duodenal outlet partial obstruction  Antibiotics:  none   Subjective   No further loose stool Has sharp Pleuritic CP L sided--> NO fever no chills no n No v No blurred or double vision     Objective    Interim History: none  Telemetry: nsr   Objective: Filed Vitals:   06/07/13 0620 06/07/13 1417 06/07/13 2134 06/08/13 0550  BP: 113/52 115/51 122/51 125/57  Pulse:  99 101 74  Temp: 98.1 F (36.7 C) 97.9 F (36.6 C) 98.4 F (36.9 C) 97.8 F (36.6 C)  TempSrc: Oral Oral Oral Oral  Resp: 20 18 18 18   Height:      Weight:      SpO2: 94% 96% 96% 97%    Intake/Output Summary (Last 24 hours) at 06/08/13 1220 Last data filed at  06/08/13 1040  Gross per 24 hour  Intake      6 ml  Output    625 ml  Net   -619 ml    Exam:  General: EOMI, NCAT Cardiovascular: s1 s2 no m/r/g Respiratory: clear Abdomen: soft, Slightly tender epigastrium Skin: no LE edema MSK: none Neurointact  Data Reviewed: Basic Metabolic Panel:  Recent Labs Lab 06/02/13 1540 06/03/13 0350 06/04/13 0318  NA 137 139 140  K 4.3 4.3 4.4  CL 96 102 102  CO2 23 23 25   GLUCOSE 102* 86 85  BUN 11 10 8   CREATININE 0.52 0.53 0.57  CALCIUM 9.4 8.7 8.9   Liver Function Tests:  Recent Labs Lab 06/02/13 1540 06/03/13 0821  AST 25 37  ALT 15 17  ALKPHOS 87 80  BILITOT 0.6 0.5  PROT 7.1 6.2  ALBUMIN 3.2* 2.7*    Recent Labs Lab 06/03/13 0821  LIPASE 22   No results found for this basename: AMMONIA,  in the last 168 hours CBC:  Recent Labs Lab 06/02/13 1540 06/03/13 0350 06/04/13 0318  WBC 10.5 8.6 6.3  NEUTROABS  --   --  3.9  HGB 12.0 11.0* 11.4*  HCT 35.9* 34.0* 34.3*  MCV 93.0 93.9 92.5  PLT 379 385 347   Cardiac Enzymes:  Recent Labs Lab 06/02/13 1540 06/02/13 2220 06/03/13 0350  TROPONINI <0.30 <0.30 <0.30   BNP: No components found with this basename: POCBNP,  CBG: No results found for this basename: GLUCAP,  in the last 168 hours  Recent Results (from  the past 240 hour(s))  CLOSTRIDIUM DIFFICILE BY PCR     Status: None   Collection Time    05/29/13  1:00 PM      Result Value Range Status   C difficile by pcr NEGATIVE  NEGATIVE Final   Comment: Performed at [place] DIFFICILE BY PCR     Status: None   Collection Time    06/06/13  7:49 PM      Result Value Range Status   C difficile by pcr NEGATIVE  NEGATIVE Final     Studies:              All Imaging reviewed and is as per above notation   Scheduled Meds: . feeding supplement (ENSURE COMPLETE)  237 mL Oral BID BM  . pantoprazole  40 mg Oral BID  . sodium chloride  3 mL Intravenous Q12H   Continuous  Infusions:     Assessment/Plan:  1. Back pain + Small L sided pleural effusion on 1 VV CXR 1/26-Unclear if Parapneumonia vs other casues.  Get stat 2 vw CXr.  Discussed by phone 1/2 7with Dr. Nelda Marseille of Pulm who states this looks like a progressive post-inflammatory pleurisy-doesn't recommend further abx in absence of fever, wbc etc.--Cannot give NSAIDs 2./2 t0 #2.  Will trial Tramdol and reassess in am-Will need IV abx if becomes septic appearing 2. Duodenal ulcer c N/v-Lipase 22.  MRCP neg 1/22, EGD-Duuodenal ulcer- PPI, Bowel rest.  Await Gastrin levels, H. Pylori IgG <0.40, path from Biopsy pending- GI signed off 1/26 3. Diarrhea-likely chronic-NO further work-up.  Repeat CDIFF PCR is neg.  4. GIB- appreciate GI input. Hemoccult stools are neg 5. Anemia-baseline 11-12.  Monitor 6. cyst right lower pole measures 17 x 12 mm kidney-OP eval.  Stable right now-Spoke with Dr. Janice Norrie 1/23-Could get OP MRI kidney c contrast to better characterise-agrees to see her in office.   Code Status: Full Family Communication:  No one at bedside Disposition Plan:  Inpatient pending w/u   Verneita Griffes, MD  [facility] Pager [phone] 06/08/2013, 12:20 PM    LOS: 6 days

## 2013-06-09 DIAGNOSIS — M26629 Arthralgia of temporomandibular joint, unspecified side: Secondary | ICD-10-CM

## 2013-06-09 NOTE — Progress Notes (Addendum)
[facility]                                                                              Patient Demographics  Karla Roman, is a 73 y.o. female, DOB - 09-19-1940, [id number]  Admit date - 06/02/2013   Admitting Physician Verlee Monte, MD  Outpatient Primary MD for the patient is Adella Hare, MD  LOS - 7   No chief complaint on file.       Assessment & Plan   Coffee Ground Emesis/GIB -GI consulted and signed off, patient will need follow up with Dr. Olevia Perches in 2 weeks.  -EGD conducted on 1/23: Duodenal ulcer and partial outlet obstruction -Continue protonix BID -gastrin level pending, H pylori IgG <0.40 -Biopsy path pending  Nausea/Vomiting/Diarrhea -C diff negative -Diarrhea resolved -Improving --Lipase 22, MRCP negative on 1/22  Small L sided pleural effusion/?Pleurisy -CXR 1/26-Unclear if Parapneumonia vs other casues.  -CXR 1/27-Left pleural effusion and basilar opacity -Previous attending spoke with Pulm (Dr. Nelda Marseille):  states this looks like a progressive post-inflammatory pleurisy-doesn't recommend further abx in absence of fever, wbc etc -Cannot give NSAIDs due to GIB  Anemia, normacytic  -baseline 11-12, currently stable  Cyst right lower pole  -measures 17 x 12 mm kidney -Will need outpatient followup and eval with Dr. Janice Norrie 1/23, may need outpt MRI  Severe malnutrition -Continue feeding supplementation  Code Status: Full  Family Communication: None at bedside  Disposition Plan:  Admitted  Time Spent in minutes   30 minutes  Procedures  EGD on 06/04/2012 Mark duodenal ulceration status post biopsy, duodenal outlet partial obstruction  Consults   Gastroenterology  DVT Prophylaxis  SCDs  Lab Results  Component Value Date   PLT 347 06/04/2013    Medications  Scheduled Meds: . feeding supplement (ENSURE COMPLETE)  237 mL Oral BID BM  . pantoprazole  40 mg Oral BID  . sodium chloride  3 mL Intravenous Q12H   Continuous  Infusions:  PRN Meds:.acetaminophen, loperamide, morphine injection, ondansetron (ZOFRAN) IV, ondansetron, traMADol  Antibiotics   Anti-infectives   None        Subjective:   Karla Roman seen and examined today.  Patient complains of left sided pain which is sharp and exacerbated by deep breathing.  She denies cough or diarrhea.  She has been able to tolerate a diet.    Objective:   Filed Vitals:   06/08/13 0550 06/08/13 1511 06/08/13 2148 06/09/13 0500  BP: 125/57 126/56 118/51 115/42  Pulse: 74 105 80 90  Temp: 97.8 F (36.6 C) 98.6 F (37 C) 98.4 F (36.9 C) 98.1 F (36.7 C)  TempSrc: Oral Oral Oral Oral  Resp: 18 17 16 16   Height:      Weight:      SpO2: 97% 95% 98% 96%    Wt Readings from Last 3 Encounters:  06/04/13 51.07 kg (112 lb 9.4 oz)  06/04/13 51.07 kg (112 lb 9.4 oz)  06/02/13 49.805 kg (109 lb 12.8 oz)     Intake/Output Summary (Last 24 hours) at 06/09/13 0847 Last data filed at 06/09/13 0500  Gross per 24 hour  Intake    123 ml  Output  1475 ml  Net  -1352 ml    Exam  General: Well developed, well nourished, NAD, appears stated age  HEENT: NCAT, PERRLA, EOMI, Anicteic Sclera, mucous membranes moist. No pharyngeal erythema or exudates  Neck: Supple, no JVD, no masses  Cardiovascular: S1 S2 auscultated, no rubs, murmurs or gallops. Regular rate and rhythm.  Respiratory: Clear to auscultation bilaterally with equal chest rise  Abdomen: Soft, tenderness in epigastric region, nondistended, + bowel sounds  Extremities: warm dry without cyanosis clubbing or edema  Neuro: AAOx3, cranial nerves grossly intact.   Skin: Without rashes exudates or nodules  Psych: Normal affect and demeanor with intact judgement and insight, anxious  Data Review   Micro Results Recent Results (from the past 240 hour(s))  CLOSTRIDIUM DIFFICILE BY PCR     Status: None   Collection Time    06/06/13  7:49 PM      Result Value Range Status   C  difficile by pcr NEGATIVE  NEGATIVE Final    Radiology Reports Dg Chest 2 View  06/08/2013   CLINICAL DATA:  Left-sided chest pain  EXAM: CHEST  2 VIEW  COMPARISON:  06/07/2013  FINDINGS: Left pleural effusion stable allowing for differences in technique. Left basilar opacity is noted. Lungs otherwise clear. Apical pleural thickening is stable and irregular. No pneumothorax.  IMPRESSION: Left pleural effusion and basilar opacity.   Electronically Signed   By: Maryclare Bean M.D.   On: 06/08/2013 17:09   Dg Chest 2 View  05/29/2013   CLINICAL DATA:  Chest discomfort. Vomiting. Diarrhea. Recent pneumonia.  EXAM: CHEST  2 VIEW  COMPARISON:  None.  FINDINGS: Improving bibasilar infiltrates seen since previous study. New tiny left pleural effusion noted. No new or worsening areas of infiltrate seen. Heart size is normal.  IMPRESSION: Improving bibasilar infiltrates.  New tiny left pleural effusion.   Electronically Signed   By: Earle Gell M.D.   On: 05/29/2013 07:28   Dg Chest 2 View  05/22/2013   CLINICAL DATA:  Productive cough  EXAM: CHEST  2 VIEW  COMPARISON:  04/01/2011  FINDINGS: Cardiac shadow is within normal limits. The lungs are well aerated bilaterally but demonstrate bibasilar infiltrative changes. No acute bony abnormality is noted. Previously seen density in the right lung apex is less well visualized on the current exam.  IMPRESSION: Bibasilar infiltrates.   Electronically Signed   By: Inez Catalina M.D.   On: 05/22/2013 18:31   Dg Abd 1 View  05/29/2013   CLINICAL DATA:  Nausea vomiting and diarrhea for 3 days  EXAM: ABDOMEN - 1 VIEW  COMPARISON:  None.  FINDINGS: No abnormally dilated loops of bowel. Bowel gas pattern is unremarkable. Mild aortic calcification noted. No abnormal opacities otherwise.  IMPRESSION: Bowel gas pattern within normal limits   Electronically Signed   By: Skipper Cliche M.D.   On: 05/29/2013 10:22   US Abdomen Complete  06/03/2013   CLINICAL DATA:  Rule out  choledocholithiasis  EXAM: ULTRASOUND ABDOMEN COMPLETE  COMPARISON:  CT 06/03/2013  FINDINGS: Gallbladder:  No gallstones or wall thickening visualized. No sonographic Murphy sign noted.  Common bile duct:  Diameter: 5.5 mm  Liver:  No focal lesion identified. Within normal limits in parenchymal echogenicity.  IVC:  No abnormality visualized.  Pancreas:  Limited  Spleen:  Size and appearance within normal limits.  Right Kidney:  Length: 9.7 cm. Complex cyst right lower pole measures 17 x 12 mm with a central echogenic area. This is  a subtle lesion on CT but no calcification is noted on CT. Echogenicity within normal limits. No mass or hydronephrosis visualized.  Left Kidney:  Length: 10.2 cm. Echogenicity within normal limits. No mass or hydronephrosis visualized.  Abdominal aorta:  No aneurysm visualized.  Other findings:  Left pleural effusion  IMPRESSION: Negative for gallstones.  No biliary duct dilatation.  Small complex cyst left lower pole.   Electronically Signed   By: Franchot Gallo M.D.   On: 06/03/2013 09:48   Ct Abdomen Pelvis W Contrast  06/03/2013   CLINICAL DATA:  Nausea and vomiting.  Abdominal pain.  EXAM: CT ABDOMEN AND PELVIS WITH CONTRAST  TECHNIQUE: Multidetector CT imaging of the abdomen and pelvis was performed using the standard protocol following bolus administration of intravenous contrast.  CONTRAST:  179mL OMNIPAQUE IOHEXOL 300 MG/ML  SOLN  COMPARISON:  DG ABDOMEN 1V dated 05/29/2013  FINDINGS: Lower Chest: Motion degradation. Left greater than right bibasilar atelectasis. Cardiomegaly. Small left pleural effusion. Small hiatal hernia.  Abdomen/Pelvis: No focal liver lesion. Normal spleen. Underdistended proximal stomach. Mild pancreatic atrophy, without pancreatic ductal dilatation  Borderline intrahepatic biliary ductal dilatation. Gallbladder unremarkable. The common duct measures upper normal at 8 mm on coronal image 30. There is mild mucosal hyper enhancement within. The common  duct is difficult to follow, secondary to the ampulla being positioned far right of midline. There is no convincing evidence of obstructive stone or mass. There is a periampullary descending duodenal diverticulum. Ill definition of fat planes in the porta hepatis, indeterminate. Example image 26/series 2. Edema or minimal fluid adjacent the inferior genu of the duodenum on image 37/series 2 and coronal image 42.  Normal adrenal glands. Too small to characterize lower pole right renal lesion. Normal left kidney.  Atherosclerosis at the origin of bilateral renal arteries. Retroaortic left renal vein. No retroperitoneal or retrocrural adenopathy. Scattered colonic diverticula. Normal terminal ileum. Normal small bowel without abdominal ascites.  No pelvic adenopathy. Pelvic floor laxity. Otherwise normal urinary bladder. Hysterectomy. No adnexal mass or significant free fluid.  Bones/Musculoskeletal:  No acute osseous abnormality.  IMPRESSION: 1. Ill definition of porta hepatis fat planes with minimal edema/fluid adjacent the descending duodenum. Concurrent mucosal hyper enhancement within the common duct and borderline intra/extrahepatic biliary ductal dilatation. Appearance is nonspecific. Correlate with liver function tests to exclude otherwise occult choledocholithiasis. Also correlate with laboratory values to exclude pancreatitis or possible duodenitis. Depending on clinical symptoms, ultrasound should be considered. If choledocholithiasis is a concern, consider MRCP. 2. No other explanation for patient's symptoms. 3. Small left pleural effusion. 4. Pelvic floor laxity.   Electronically Signed   By: Abigail Miyamoto M.D.   On: 06/03/2013 02:09   Dg Chest Port 1 View  06/07/2013   CLINICAL DATA:  Cough.  EXAM: PORTABLE CHEST - 1 VIEW  COMPARISON:  Chest x-ray 05/29/2013.  FINDINGS: Enlarging small left pleural effusion. Atelectasis and/or consolidation in the left base. Right lung is clear. No evidence of pulmonary  edema. Heart size is normal. Mediastinal contours are unremarkable. Orthopedic fixation hardware in the lower cervical spine.  IMPRESSION: 1. Enlarging small left pleural effusion with probable subsegmental atelectasis in the left lower lobe (although underlying airspace consolidation from a aspiration or pneumonia is difficult to exclude).   Electronically Signed   By: Vinnie Langton M.D.   On: 06/07/2013 19:17   Mr Lambert Mody Cm/mrcp  06/03/2013   CLINICAL DATA:  Abdominal pain with vomiting and diarrhea. Treated for gastroenteritis.  EXAM: MRI ABDOMEN  WITHOUT  (INCLUDING MRCP)  TECHNIQUE: Multiplanar multisequence MR imaging of the abdomen was performed. Heavily T2-weighted images of the biliary and pancreatic ducts were obtained, and three-dimensional MRCP images were rendered by post processing.  COMPARISON:  US ABDOMEN COMPLETE dated 06/03/2013; MR L SPINE W/O dated 08/26/2006; CT ABD/PELVIS W CM dated 06/03/2013  FINDINGS: Examination is mildly motion degraded, especially on the thin-section T2 weighted MRCP images. The gallbladder appears normal. There is no evidence of gallstones or gallbladder wall thickening. There is no intra or extrahepatic biliary dilatation. There is no evidence of choledocholithiasis.  There is no evidence of pancreatic mass, focal surrounding fluid collection or pancreatic ductal dilatation. The pancreatic duct is not well visualized, although there is no definite evidence of pancreas divisum. The pancreas enhances normally following contrast.  Better seen on the recent CT is wall thickening of the distal stomach and proximal duodenum with surrounding inflammatory change. There is a periampullary duodenum diverticulum.  The liver, spleen and adrenal glands appear normal. There is a small right renal cyst. No hydronephrosis.  Left pleural effusion and adjacent left lower lobe airspace disease/atelectasis are noted.  IMPRESSION: 1. No evidence of biliary dilatation or  choledocholithiasis. 2. Right upper quadrant inflammatory process with epicenter on the proximal duodenum, suggesting duodenitis or peptic ulcer disease. No evidence of pancreatitis or focal fluid collection. 3. Left pleural effusion with adjacent left lower lobe atelectasis or infiltrate.   Electronically Signed   By: Camie Patience M.D.   On: 06/03/2013 18:56    CBC  Recent Labs Lab 06/02/13 1540 06/03/13 0350 06/04/13 0318  WBC 10.5 8.6 6.3  HGB 12.0 11.0* 11.4*  HCT 35.9* 34.0* 34.3*  PLT 379 385 347  MCV 93.0 93.9 92.5  MCH 31.1 30.4 30.7  MCHC 33.4 32.4 33.2  RDW 13.5 13.3 13.2  LYMPHSABS  --   --  1.4  MONOABS  --   --  0.7  EOSABS  --   --  0.1  BASOSABS  --   --  0.1    Chemistries   Recent Labs Lab 06/02/13 1540 06/03/13 0350 06/03/13 0821 06/04/13 0318  NA 137 139  --  140  K 4.3 4.3  --  4.4  CL 96 102  --  102  CO2 23 23  --  25  GLUCOSE 102* 86  --  85  BUN 11 10  --  8  CREATININE 0.52 0.53  --  0.57  CALCIUM 9.4 8.7  --  8.9  AST 25  --  37  --   ALT 15  --  17  --   ALKPHOS 87  --  80  --   BILITOT 0.6  --  0.5  --    ------------------------------------------------------------------------------------------------------------------ estimated creatinine clearance is 48.8 ml/min (by C-G formula based on Cr of 0.57). ------------------------------------------------------------------------------------------------------------------ No results found for this basename: HGBA1C,  in the last 72 hours ------------------------------------------------------------------------------------------------------------------ No results found for this basename: CHOL, HDL, LDLCALC, TRIG, CHOLHDL, LDLDIRECT,  in the last 72 hours ------------------------------------------------------------------------------------------------------------------ No results found for this basename: TSH, T4TOTAL, FREET3, T3FREE, THYROIDAB,  in the last 72  hours ------------------------------------------------------------------------------------------------------------------ No results found for this basename: VITAMINB12, FOLATE, FERRITIN, TIBC, IRON, RETICCTPCT,  in the last 72 hours  Coagulation profile No results found for this basename: INR, PROTIME,  in the last 168 hours  No results found for this basename: DDIMER,  in the last 72 hours  Cardiac Enzymes  Recent Labs Lab 06/02/13 1540 06/02/13 2220  06/03/13 0350  TROPONINI <0.30 <0.30 <0.30   ------------------------------------------------------------------------------------------------------------------ No components found with this basename: POCBNP,     Delila Kuklinski D.O. on 06/09/2013 at 8:47 AM  Between 7am to 7pm - Pager - [phone]  After 7pm go to [email or web address] - password [email or web address]  And look for the night coverage person covering for me after hours  [facility] Office  [phone]

## 2013-06-09 NOTE — Progress Notes (Signed)
NUTRITION FOLLOW UP  Intervention:   Continue Ensure Complete BID Continue Magic Cup once daily Encourage PO intake  Nutrition Dx:   Inadequate oral intake related to nausea and poor appetite as evidenced by 8% weight loss in less than 3 months; ongoing  Goal:   Pt to meet >/= 90% of their estimated nutrition needs; not met  Monitor:   Diet advancement; full liquids on 1/23, regular diet 1/25 PO intake; <50% of meals Weight; stable Labs; low hemoglobin  Assessment:   73 y.o. female presents to the ED with a CC of coffee ground emesis, diarrhea and nausea. Pt has a PMH of diverticulosis, diverticulitis, GERD, MVP, anemia, hiatal hernia, schatzki's ring. Pt presented to her PCP with 2 weeks of flu like symptoms and was treated with Tamiflu (stopped this medication after 2-3 days secondary to nausea and diarrhea). Pt was placed on Zofran for nausea on 05/20/13. Pt presented to the ED on 05/22/13 and was diagnosed with CAP and started on a 1 week course of Levaquin. Pt was then admitted into the hospital on 05/29/13 reporting 6 episodes of loose stools over 24 hrs and 2 episodes of vomiting and was currently on her 6th day of Levaquin. Pt was treated for gastroenteritis and d/c on 06/01/13. Pt presents to the ED complaining of vomiting and diarrhea that began after being d/c on 06/01/13.  Pt reports ongoing poor appetite but, states she is feeling better. She ate 1/2 sandwich for lunch which is about the amount she has been able to tolerate at each meal. She reports drinking Ensure Complete twice daily but, she has not been receiving [employer].   Height: Ht Readings from Last 1 Encounters:  06/04/13 $RemoveB'5\' 2"'nAPLYgmJ$  (1.575 m)    Weight Status:   Wt Readings from Last 1 Encounters:  06/04/13 112 lb 9.4 oz (51.07 kg)    Re-estimated needs:  Kcal: 1400-1600  Protein: 60-70 grams  Fluid: 1.4-1.6 L/day  Skin: intact  Diet Order: General   Intake/Output Summary (Last 24 hours) at 06/09/13 1417 Last  data filed at 06/09/13 1417  Gross per 24 hour  Intake    540 ml  Output   1800 ml  Net  -1260 ml    Last BM: 1/25   Labs:   Recent Labs Lab 06/02/13 1540 06/03/13 0350 06/04/13 0318  NA 137 139 140  K 4.3 4.3 4.4  CL 96 102 102  CO2 $Re'23 23 25  'OqM$ BUN $R'11 10 8  'ga$ CREATININE 0.52 0.53 0.57  CALCIUM 9.4 8.7 8.9  GLUCOSE 102* 86 85    CBG (last 3)  No results found for this basename: GLUCAP,  in the last 72 hours  Scheduled Meds: . feeding supplement (ENSURE COMPLETE)  237 mL Oral BID BM  . pantoprazole  40 mg Oral BID  . sodium chloride  3 mL Intravenous Q12H    Continuous Infusions:   Pryor Ochoa RD, LDN Inpatient Clinical Dietitian Pager: [phone] After Hours Pager: [phone]

## 2013-06-10 DIAGNOSIS — E43 Unspecified severe protein-calorie malnutrition: Secondary | ICD-10-CM

## 2013-06-10 DIAGNOSIS — R079 Chest pain, unspecified: Secondary | ICD-10-CM

## 2013-06-10 LAB — GASTRIN: Gastrin: 66 pg/mL (ref <=100)

## 2013-06-10 NOTE — Progress Notes (Signed)
[facility]                                                                              Patient Demographics  Karla Roman, is a 73 y.o. female, DOB - [birth year], [id number]  Admit date - 06/02/2013   Admitting Physician Verlee Monte, MD  Outpatient Primary MD for the patient is Adella Hare, MD  LOS - 8   No chief complaint on file.       Assessment & Plan   Coffee Ground Emesis/GIB -GI consulted and signed off, patient will need follow up with Dr. Olevia Perches in 2 weeks.  -EGD conducted on 1/23: Duodenal ulcer and partial outlet obstruction -Continue protonix BID -gastrin level pending, H pylori IgG <0.40 -Biopsy pathology: - BENIGN DUODENAL MUCOSA WITH ULCERATION AND REACTIVE CHANGES, SEE COMMENT. NO DYSPLASIA OR MALIGNANCY IDENTIFIED.  Nausea/Vomiting/Diarrhea -C diff negative -Diarrhea resolved -Improving -Lipase 22, MRCP negative on 1/22  Small L sided pleural effusion/?Pleurisy -CXR 1/26-Unclear if Parapneumonia vs other casues.  -CXR 1/27-Left pleural effusion and basilar opacity -Previous attending spoke with Pulm (Dr. Nelda Marseille):  states this looks like a progressive post-inflammatory pleurisy-doesn't recommend further abx in absence of fever, wbc etc -Cannot give NSAIDs due to GIB  Anemia, normacytic  -baseline 11-12, currently stable  Cyst right lower pole  -measures 17 x 12 mm kidney -Will need outpatient followup and eval with Dr. Janice Norrie 1/23, may need outpt MRI  Severe malnutrition -Continue feeding supplementation  Constipation -Will order stool softener for patient.  Code Status: Full  Family Communication: None at bedside  Disposition Plan:  Admitted, likely discharge 1/30  Time Spent in minutes   25 minutes  Procedures  EGD on 06/04/2012 Mark duodenal ulceration status post biopsy, duodenal outlet partial obstruction  Consults   Gastroenterology  DVT Prophylaxis  SCDs  Lab Results  Component Value Date   PLT 347 06/04/2013     Medications  Scheduled Meds: . feeding supplement (ENSURE COMPLETE)  237 mL Oral BID BM  . pantoprazole  40 mg Oral BID  . sodium chloride  3 mL Intravenous Q12H   Continuous Infusions:  PRN Meds:.acetaminophen, loperamide, morphine injection, ondansetron (ZOFRAN) IV, ondansetron, traMADol  Antibiotics   Anti-infectives   None        Subjective:   Karla Roman seen and examined today.  Patient complains of left sided pain which is sharp and exacerbated by deep breathing.  She denies cough or diarrhea.  She has been able to tolerate a diet.  She complains of not having a bowel movement in about 4 days.    Objective:   Filed Vitals:   06/09/13 1416 06/09/13 1601 06/09/13 2150 06/10/13 0558  BP: 115/53  122/55 111/46  Pulse: 100  102 90  Temp: 98.3 F (36.8 C)  98.3 F (36.8 C) 98.3 F (36.8 C)  TempSrc: Oral  Oral Oral  Resp: 18  16 14   Height:      Weight:  53.3 kg (117 lb 8.1 oz)    SpO2: 94%  96% 96%    Wt Readings from Last 3 Encounters:  06/09/13 53.3 kg (117 lb 8.1 oz)  06/09/13 53.3 kg (117 lb 8.1 oz)  06/02/13 49.805 kg (109 lb 12.8 oz)     Intake/Output Summary (Last 24 hours) at 06/10/13 0816 Last data filed at 06/10/13 0500  Gross per 24 hour  Intake    420 ml  Output   1600 ml  Net  -1180 ml    Exam  General: Well developed, well nourished, NAD, appears stated age  HEENT: NCAT, PERRLA, EOMI, Anicteic Sclera, mucous membranes moist. No pharyngeal erythema or exudates  Neck: Supple, no JVD, no masses  Cardiovascular: S1 S2 auscultated, no rubs, murmurs or gallops. Regular rate and rhythm.  Respiratory: Clear to auscultation bilaterally with equal chest rise  Abdomen: Soft, tenderness in epigastric region, nondistended, + bowel sounds  Extremities: warm dry without cyanosis clubbing or edema  Neuro: AAOx3, cranial nerves grossly intact.   Skin: Without rashes exudates or nodules  Psych: Normal affect and demeanor with intact  judgement and insight, anxious  Data Review   Micro Results Recent Results (from the past 240 hour(s))  CLOSTRIDIUM DIFFICILE BY PCR     Status: None   Collection Time    06/06/13  7:49 PM      Result Value Range Status   C difficile by pcr NEGATIVE  NEGATIVE Final    Radiology Reports Dg Chest 2 View  06/08/2013   CLINICAL DATA:  Left-sided chest pain  EXAM: CHEST  2 VIEW  COMPARISON:  06/07/2013  FINDINGS: Left pleural effusion stable allowing for differences in technique. Left basilar opacity is noted. Lungs otherwise clear. Apical pleural thickening is stable and irregular. No pneumothorax.  IMPRESSION: Left pleural effusion and basilar opacity.   Electronically Signed   By: Maryclare Bean M.D.   On: 06/08/2013 17:09   Dg Chest 2 View  05/29/2013   CLINICAL DATA:  Chest discomfort. Vomiting. Diarrhea. Recent pneumonia.  EXAM: CHEST  2 VIEW  COMPARISON:  None.  FINDINGS: Improving bibasilar infiltrates seen since previous study. New tiny left pleural effusion noted. No new or worsening areas of infiltrate seen. Heart size is normal.  IMPRESSION: Improving bibasilar infiltrates.  New tiny left pleural effusion.   Electronically Signed   By: Earle Gell M.D.   On: 05/29/2013 07:28   Dg Chest 2 View  05/22/2013   CLINICAL DATA:  Productive cough  EXAM: CHEST  2 VIEW  COMPARISON:  04/01/2011  FINDINGS: Cardiac shadow is within normal limits. The lungs are well aerated bilaterally but demonstrate bibasilar infiltrative changes. No acute bony abnormality is noted. Previously seen density in the right lung apex is less well visualized on the current exam.  IMPRESSION: Bibasilar infiltrates.   Electronically Signed   By: Inez Catalina M.D.   On: 05/22/2013 18:31   Dg Abd 1 View  05/29/2013   CLINICAL DATA:  Nausea vomiting and diarrhea for 3 days  EXAM: ABDOMEN - 1 VIEW  COMPARISON:  None.  FINDINGS: No abnormally dilated loops of bowel. Bowel gas pattern is unremarkable. Mild aortic calcification  noted. No abnormal opacities otherwise.  IMPRESSION: Bowel gas pattern within normal limits   Electronically Signed   By: Skipper Cliche M.D.   On: 05/29/2013 10:22   US Abdomen Complete  06/03/2013   CLINICAL DATA:  Rule out choledocholithiasis  EXAM: ULTRASOUND ABDOMEN COMPLETE  COMPARISON:  CT 06/03/2013  FINDINGS: Gallbladder:  No gallstones or wall thickening visualized. No sonographic Murphy sign noted.  Common bile duct:  Diameter: 5.5 mm  Liver:  No focal lesion identified. Within normal limits in parenchymal echogenicity.  IVC:  No abnormality visualized.  Pancreas:  Limited  Spleen:  Size and appearance within normal limits.  Right Kidney:  Length: 9.7 cm. Complex cyst right lower pole measures 17 x 12 mm with a central echogenic area. This is a subtle lesion on CT but no calcification is noted on CT. Echogenicity within normal limits. No mass or hydronephrosis visualized.  Left Kidney:  Length: 10.2 cm. Echogenicity within normal limits. No mass or hydronephrosis visualized.  Abdominal aorta:  No aneurysm visualized.  Other findings:  Left pleural effusion  IMPRESSION: Negative for gallstones.  No biliary duct dilatation.  Small complex cyst left lower pole.   Electronically Signed   By: Franchot Gallo M.D.   On: 06/03/2013 09:48   Ct Abdomen Pelvis W Contrast  06/03/2013   CLINICAL DATA:  Nausea and vomiting.  Abdominal pain.  EXAM: CT ABDOMEN AND PELVIS WITH CONTRAST  TECHNIQUE: Multidetector CT imaging of the abdomen and pelvis was performed using the standard protocol following bolus administration of intravenous contrast.  CONTRAST:  160mL OMNIPAQUE IOHEXOL 300 MG/ML  SOLN  COMPARISON:  DG ABDOMEN 1V dated 05/29/2013  FINDINGS: Lower Chest: Motion degradation. Left greater than right bibasilar atelectasis. Cardiomegaly. Small left pleural effusion. Small hiatal hernia.  Abdomen/Pelvis: No focal liver lesion. Normal spleen. Underdistended proximal stomach. Mild pancreatic atrophy, without  pancreatic ductal dilatation  Borderline intrahepatic biliary ductal dilatation. Gallbladder unremarkable. The common duct measures upper normal at 8 mm on coronal image 30. There is mild mucosal hyper enhancement within. The common duct is difficult to follow, secondary to the ampulla being positioned far right of midline. There is no convincing evidence of obstructive stone or mass. There is a periampullary descending duodenal diverticulum. Ill definition of fat planes in the porta hepatis, indeterminate. Example image 26/series 2. Edema or minimal fluid adjacent the inferior genu of the duodenum on image 37/series 2 and coronal image 42.  Normal adrenal glands. Too small to characterize lower pole right renal lesion. Normal left kidney.  Atherosclerosis at the origin of bilateral renal arteries. Retroaortic left renal vein. No retroperitoneal or retrocrural adenopathy. Scattered colonic diverticula. Normal terminal ileum. Normal small bowel without abdominal ascites.  No pelvic adenopathy. Pelvic floor laxity. Otherwise normal urinary bladder. Hysterectomy. No adnexal mass or significant free fluid.  Bones/Musculoskeletal:  No acute osseous abnormality.  IMPRESSION: 1. Ill definition of porta hepatis fat planes with minimal edema/fluid adjacent the descending duodenum. Concurrent mucosal hyper enhancement within the common duct and borderline intra/extrahepatic biliary ductal dilatation. Appearance is nonspecific. Correlate with liver function tests to exclude otherwise occult choledocholithiasis. Also correlate with laboratory values to exclude pancreatitis or possible duodenitis. Depending on clinical symptoms, ultrasound should be considered. If choledocholithiasis is a concern, consider MRCP. 2. No other explanation for patient's symptoms. 3. Small left pleural effusion. 4. Pelvic floor laxity.   Electronically Signed   By: Abigail Miyamoto M.D.   On: 06/03/2013 02:09   Dg Chest Port 1 View  06/07/2013    CLINICAL DATA:  Cough.  EXAM: PORTABLE CHEST - 1 VIEW  COMPARISON:  Chest x-ray 05/29/2013.  FINDINGS: Enlarging small left pleural effusion. Atelectasis and/or consolidation in the left base. Right lung is clear. No evidence of pulmonary edema. Heart size is normal. Mediastinal contours are unremarkable. Orthopedic fixation hardware in the lower cervical spine.  IMPRESSION: 1. Enlarging small left pleural effusion with probable subsegmental atelectasis in the left lower lobe (although underlying airspace consolidation from a aspiration or pneumonia is difficult to exclude).  Electronically Signed   By: Vinnie Langton M.D.   On: 06/07/2013 19:17   Mr Lambert Mody Cm/mrcp  06/03/2013   CLINICAL DATA:  Abdominal pain with vomiting and diarrhea. Treated for gastroenteritis.  EXAM: MRI ABDOMEN WITHOUT  (INCLUDING MRCP)  TECHNIQUE: Multiplanar multisequence MR imaging of the abdomen was performed. Heavily T2-weighted images of the biliary and pancreatic ducts were obtained, and three-dimensional MRCP images were rendered by post processing.  COMPARISON:  US ABDOMEN COMPLETE dated 06/03/2013; MR L SPINE W/O dated 08/26/2006; CT ABD/PELVIS W CM dated 06/03/2013  FINDINGS: Examination is mildly motion degraded, especially on the thin-section T2 weighted MRCP images. The gallbladder appears normal. There is no evidence of gallstones or gallbladder wall thickening. There is no intra or extrahepatic biliary dilatation. There is no evidence of choledocholithiasis.  There is no evidence of pancreatic mass, focal surrounding fluid collection or pancreatic ductal dilatation. The pancreatic duct is not well visualized, although there is no definite evidence of pancreas divisum. The pancreas enhances normally following contrast.  Better seen on the recent CT is wall thickening of the distal stomach and proximal duodenum with surrounding inflammatory change. There is a periampullary duodenum diverticulum.  The liver, spleen and adrenal  glands appear normal. There is a small right renal cyst. No hydronephrosis.  Left pleural effusion and adjacent left lower lobe airspace disease/atelectasis are noted.  IMPRESSION: 1. No evidence of biliary dilatation or choledocholithiasis. 2. Right upper quadrant inflammatory process with epicenter on the proximal duodenum, suggesting duodenitis or peptic ulcer disease. No evidence of pancreatitis or focal fluid collection. 3. Left pleural effusion with adjacent left lower lobe atelectasis or infiltrate.   Electronically Signed   By: Camie Patience M.D.   On: 06/03/2013 18:56    CBC  Recent Labs Lab 06/04/13 0318  WBC 6.3  HGB 11.4*  HCT 34.3*  PLT 347  MCV 92.5  MCH 30.7  MCHC 33.2  RDW 13.2  LYMPHSABS 1.4  MONOABS 0.7  EOSABS 0.1  BASOSABS 0.1    Chemistries   Recent Labs Lab 06/03/13 0821 06/04/13 0318  NA  --  140  K  --  4.4  CL  --  102  CO2  --  25  GLUCOSE  --  85  BUN  --  8  CREATININE  --  0.57  CALCIUM  --  8.9  AST 37  --   ALT 17  --   ALKPHOS 80  --   BILITOT 0.5  --    ------------------------------------------------------------------------------------------------------------------ estimated creatinine clearance is 48.8 ml/min (by C-G formula based on Cr of 0.57). ------------------------------------------------------------------------------------------------------------------ No results found for this basename: HGBA1C,  in the last 72 hours ------------------------------------------------------------------------------------------------------------------ No results found for this basename: CHOL, HDL, LDLCALC, TRIG, CHOLHDL, LDLDIRECT,  in the last 72 hours ------------------------------------------------------------------------------------------------------------------ No results found for this basename: TSH, T4TOTAL, FREET3, T3FREE, THYROIDAB,  in the last 72  hours ------------------------------------------------------------------------------------------------------------------ No results found for this basename: VITAMINB12, FOLATE, FERRITIN, TIBC, IRON, RETICCTPCT,  in the last 72 hours  Coagulation profile No results found for this basename: INR, PROTIME,  in the last 168 hours  No results found for this basename: DDIMER,  in the last 72 hours  Cardiac Enzymes No results found for this basename: CK, CKMB, TROPONINI, MYOGLOBIN,  in the last 168 hours ------------------------------------------------------------------------------------------------------------------ No components found with this basename: POCBNP,     Jodine Muchmore D.O. on 06/10/2013 at 8:16 AM  Between 7am to 7pm - Pager - [phone]  After 7pm  go to [email or web address] - password [email or web address]  And look for the night coverage person covering for me after hours  [facility] Office  [phone]

## 2013-06-11 DIAGNOSIS — E43 Unspecified severe protein-calorie malnutrition: Secondary | ICD-10-CM

## 2013-06-11 MED ORDER — TRAMADOL HCL 50 MG PO TABS: 100.0000 mg | ORAL_TABLET | Freq: Four times a day (QID) | ORAL | Status: DC | PRN

## 2013-06-11 MED ORDER — ONDANSETRON HCL 4 MG PO TABS: 4.0000 mg | ORAL_TABLET | Freq: Four times a day (QID) | ORAL | Status: DC | PRN

## 2013-06-11 MED ORDER — LOPERAMIDE HCL 2 MG PO CAPS: 2.0000 mg | ORAL_CAPSULE | ORAL | Status: DC | PRN

## 2013-06-11 MED ORDER — ENSURE COMPLETE PO LIQD: 237.0000 mL | Freq: Two times a day (BID) | ORAL | Status: DC

## 2013-06-11 MED ORDER — PANTOPRAZOLE SODIUM 40 MG PO TBEC: 40.0000 mg | DELAYED_RELEASE_TABLET | Freq: Two times a day (BID) | ORAL | Status: DC

## 2013-06-11 NOTE — Discharge Instructions (Signed)
Gastrointestinal Bleeding °Gastrointestinal (GI) bleeding means there is bleeding somewhere along the digestive tract, between the mouth and anus. °CAUSES  °There are many different problems that can cause GI bleeding. Possible causes include: °· Esophagitis. This is inflammation, irritation, or swelling of the esophagus. °· Hemorrhoids. These are veins that are full of blood (engorged) in the rectum. They cause pain, inflammation, and may bleed. °· Anal fissures. These are areas of painful tearing which may bleed. They are often caused by passing hard stool. °· Diverticulosis. These are pouches that form on the colon over time, with age, and may bleed significantly. °· Diverticulitis. This is inflammation in areas with diverticulosis. It can cause pain, fever, and bloody stools, although bleeding is rare. °· Polyps and cancer. Colon cancer often starts out as precancerous polyps. °· Gastritis and ulcers. Bleeding from the upper gastrointestinal tract (near the stomach) may travel through the intestines and produce black, sometimes tarry, often bad smelling stools. In certain cases, if the bleeding is fast enough, the stools may not be black, but red. This condition may be life-threatening. °SYMPTOMS  °· Vomiting bright red blood or material that looks like coffee grounds. °· Bloody, black, or tarry stools. °DIAGNOSIS  °Your caregiver may diagnose your condition by taking your history and performing a physical exam. More tests may be needed, including: °· X-rays and other imaging tests. °· Esophagogastroduodenoscopy (EGD). This test uses a flexible, lighted tube to look at your esophagus, stomach, and small intestine. °· Colonoscopy. This test uses a flexible, lighted tube to look at your colon. °TREATMENT  °Treatment depends on the cause of your bleeding.  °· For bleeding from the esophagus, stomach, small intestine, or colon, the caregiver doing your EGD or colonoscopy may be able to stop the bleeding as part of  the procedure. °· Inflammation or infection of the colon can be treated with medicines. °· Many rectal problems can be treated with creams, suppositories, or warm baths. °· Surgery is sometimes needed. °· Blood transfusions are sometimes needed if you have lost a lot of blood. °If bleeding is slow, you may be allowed to go home. If there is a lot of bleeding, you will need to stay in the hospital for observation. °HOME CARE INSTRUCTIONS  °· Take any medicines exactly as prescribed. °· Keep your stools soft by eating foods that are high in fiber. These foods include whole grains, legumes, fruits, and vegetables. Prunes (1 to 3 a day) work well for many people. °· Drink enough fluids to keep your urine clear or pale yellow. °SEEK IMMEDIATE MEDICAL CARE IF:  °· Your bleeding increases. °· You feel lightheaded, weak, or you faint. °· You have severe cramps in your back or abdomen. °· You pass large blood clots in your stool. °· Your problems are getting worse. °MAKE SURE YOU:  °· Understand these instructions. °· Will watch your condition. °· Will get help right away if you are not doing well or get worse. °Document Released: 04/26/2000 Document Revised: 04/15/2012 Document Reviewed: 04/08/2011 °[employer]® Patient Information ©2014 [employer]. ° °

## 2013-06-11 NOTE — Discharge Summary (Signed)
Physician Discharge Summary  Karla Roman [id number] DOB: 06-06-1940 DOA: 06/02/2013  PCP: Adella Hare, MD  Admit date: 06/02/2013 Discharge date: 06/11/2013  Time spent: 35 minutes  Recommendations for Outpatient Follow-up:  Patient will be discharged home. She is to follow up with her primary care physician within one week of discharge. She should also follow up with Dr. Olevia Perches within 2 weeks. She continue taking her medications as prescribed.   Discharge Diagnoses:  Principal Problem:   Coffee ground emesis/GI Bleed, resolved   Small left-sided pleural effusion/possible pleurisy Active Problems:   GERD, stable   CHEST PAIN, likely secondary to pleural effusion/pleurisy, stable   Diarrhea, improved   Anemia, improved   Dehydration, resolved   Nausea and vomiting, improved   Cystic right lower pole, will need followup   Duodenal ulcer hemorrhage   Severe protein-calorie malnutrition   Discharge Condition: stable  Diet recommendation: Regular  Filed Weights   06/04/13 2127 06/09/13 1601  Weight: 51.07 kg (112 lb 9.4 oz) 53.3 kg (117 lb 8.1 oz)    History of present illness:  Karla Roman is a 73 y.o. female presents to the ED with a CC of coffee ground emesis, diarrhea and nausea. Pt has a PMH of diverticulosis, diverticulitis, GERD, MVP, anemia, hiatal hernia, schatzki's ring. Pt presented to her PCP with 2 weeks of flu like symptoms and was treated with Tamiflu (stopped this medication after 2-3 days secondary to nausea and diarrhea). Pt was placed on Zofran for nausea on 05/20/13. Pt presented to the ED on 05/22/13 and was diagnosed with CAP and started on a 1 week course of Levaquin. Pt was then admitted into the hospital on 05/29/13 reporting 6 episodes of loose stools over 24 hrs and 2 episodes of vomiting and was currently on her 6th day of Levaquin. Pt was treated for gastroenteritis and d/c on 06/01/13. Pt presents to the ED complaining of vomiting and  diarrhea that began after being d/c on 06/01/13.  Pt appears dehydrated and confused, cannot recall the timeline of details since the N/V began. Pt is experiencing abdominal pain, denies any use of pepto-bismol and chronic NSAID use. States that both the vomit and diarrhea are coffee colored. Pt has been experiencing a "funny feeling" in her left chest, felt like irregular heartbeat.   Hospital Course:  Coffee Ground Emesis/GIB  -GI consulted and signed off, patient will need follow up with Dr. Olevia Perches in 2 weeks.  -EGD conducted on 1/23: Duodenal ulcer and partial outlet obstruction  -Continue protonix BID  -gastrin level pending, H pylori IgG <0.40  -Biopsy pathology: - BENIGN DUODENAL MUCOSA WITH ULCERATION AND REACTIVE CHANGES, SEE COMMENT. NO DYSPLASIA OR MALIGNANCY IDENTIFIED.   Nausea/Vomiting/Diarrhea  -C diff negative  -Diarrhea resolved  -Improving  -Lipase 22, MRCP negative on 1/22   Small L sided pleural effusion/?Pleurisy  -CXR 1/26-Unclear if Parapneumonia vs other casues.  -CXR 1/27-Left pleural effusion and basilar opacity  -Previous attending spoke with Pulm (Dr. Nelda Marseille): states this looks like a progressive post-inflammatory pleurisy-doesn't recommend further abx in absence of fever, wbc etc  -Cannot give NSAIDs due to GIB  -Continue pain control with tramadol  Anemia, normacytic  -baseline 11-12, currently stable   Cyst right lower pole  -measures 17 x 12 mm kidney  -Will need outpatient followup and eval with Dr. Janice Norrie, may need outpt MRI   Severe malnutrition  -Continue feeding supplementation   Constipation  -Resolved  Procedures: EGD on 06/04/2012  Mark duodenal ulceration status post  biopsy, duodenal outlet partial obstruction   Consultations: Gastroenterology  Discharge Exam: Filed Vitals:   06/11/13 0458  BP: 116/49  Pulse: 82  Temp: 98.1 F (36.7 C)  Resp: 20   Exam  General: Well developed, well nourished, NAD, appears stated age    HEENT: NCAT, PERRLA, EOMI, Anicteic Sclera, mucous membranes moist.  Neck: Supple, no JVD, no masses  Cardiovascular: S1 S2 auscultated, no rubs, murmurs or gallops. Regular rate and rhythm.  Respiratory: Clear to auscultation bilaterally with equal chest rise  Abdomen: Soft, tenderness in epigastric region, nondistended, + bowel sounds  Extremities: warm dry without cyanosis clubbing or edema  Neuro: AAOx3, cranial nerves grossly intact.  Skin: Without rashes exudates or nodules  Psych: Normal affect and demeanor with intact judgement and insight, anxious  Discharge Instructions      Discharge Orders   Future Orders Complete By Expires   Discharge instructions  As directed    Comments:     Patient will be discharged home. She is to follow up with her primary care physician within one week of discharge. She should also follow up with Dr. Olevia Perches within 2 weeks. She continue taking her medications as prescribed.   Increase activity slowly  As directed        Medication List    STOP taking these medications       aspirin 81 MG tablet     famotidine 40 MG tablet  Commonly known as:  PEPCID     promethazine 12.5 MG tablet  Commonly known as:  PHENERGAN      TAKE these medications       acetaminophen 500 MG tablet  Commonly known as:  TYLENOL  Take 500-1,000 mg by mouth every 6 (six) hours as needed for mild pain or headache.     acyclovir ointment 5 %  Commonly known as:  ZOVIRAX  Apply topically every 3 (three) hours.     albuterol 108 (90 BASE) MCG/ACT inhaler  Commonly known as:  PROVENTIL HFA;VENTOLIN HFA  Inhale 2 puffs into the lungs every 4 (four) hours as needed for wheezing or shortness of breath.     CALTRATE 600 1500 MG Tabs  Generic drug:  Calcium Carbonate  Take 1 tablet by mouth 2 (two) times daily. chewables     cyanocobalamin 500 MCG tablet  Take 500 mcg by mouth every morning.     dextromethorphan 30 MG/5ML liquid  Commonly known as:  DELSYM   Take 10 mLs (60 mg total) by mouth 2 (two) times daily.     feeding supplement (ENSURE COMPLETE) Liqd  Take 237 mLs by mouth 2 (two) times daily between meals.     loperamide 2 MG capsule  Commonly known as:  IMODIUM  Take 1 capsule (2 mg total) by mouth as needed for diarrhea or loose stools.     multivitamin with minerals tablet  Take 1 tablet by mouth every morning. chewables     omega-3 acid ethyl esters 1 G capsule  Commonly known as:  LOVAZA  Take 1 g by mouth every morning.     ondansetron 4 MG tablet  Commonly known as:  ZOFRAN  Take 1 tablet (4 mg total) by mouth every 6 (six) hours as needed for nausea or vomiting.     pantoprazole 40 MG tablet  Commonly known as:  PROTONIX  Take 1 tablet (40 mg total) by mouth 2 (two) times daily.     saccharomyces boulardii 250 MG capsule  Commonly  known as:  FLORASTOR  Take 1 capsule (250 mg total) by mouth 2 (two) times daily.     traMADol 50 MG tablet  Commonly known as:  ULTRAM  Take 2 tablets (100 mg total) by mouth every 6 (six) hours as needed for moderate pain or severe pain.     vitamin E 400 UNIT capsule  Take 400 Units by mouth every morning.       Allergies  Allergen Reactions  . Codeine Other (See Comments)    Anxiety, restlessness  . Levaquin [Levofloxacin]     Nausea, vomiting, diarrhea.   Follow-up Information   Follow up with Adella Hare, MD. Schedule an appointment as soon as possible for a visit in 1 week.   Specialty:  Internal Medicine   Contact information:   [address] [place] [address] [phone]       Follow up with Delfin Edis, MD. Schedule an appointment as soon as possible for a visit in 2 weeks.   Specialty:  Gastroenterology   Contact information:   [address] [place] [address] [phone]        The results of significant diagnostics from this hospitalization (including imaging, microbiology, ancillary and laboratory) are listed below for reference.     Significant Diagnostic Studies: Dg Chest 2 View  06/08/2013   CLINICAL DATA:  Left-sided chest pain  EXAM: CHEST  2 VIEW  COMPARISON:  06/07/2013  FINDINGS: Left pleural effusion stable allowing for differences in technique. Left basilar opacity is noted. Lungs otherwise clear. Apical pleural thickening is stable and irregular. No pneumothorax.  IMPRESSION: Left pleural effusion and basilar opacity.   Electronically Signed   By: Maryclare Bean M.D.   On: 06/08/2013 17:09   Dg Chest 2 View  05/29/2013   CLINICAL DATA:  Chest discomfort. Vomiting. Diarrhea. Recent pneumonia.  EXAM: CHEST  2 VIEW  COMPARISON:  None.  FINDINGS: Improving bibasilar infiltrates seen since previous study. New tiny left pleural effusion noted. No new or worsening areas of infiltrate seen. Heart size is normal.  IMPRESSION: Improving bibasilar infiltrates.  New tiny left pleural effusion.   Electronically Signed   By: Earle Gell M.D.   On: 05/29/2013 07:28   Dg Chest 2 View  05/22/2013   CLINICAL DATA:  Productive cough  EXAM: CHEST  2 VIEW  COMPARISON:  04/01/2011  FINDINGS: Cardiac shadow is within normal limits. The lungs are well aerated bilaterally but demonstrate bibasilar infiltrative changes. No acute bony abnormality is noted. Previously seen density in the right lung apex is less well visualized on the current exam.  IMPRESSION: Bibasilar infiltrates.   Electronically Signed   By: Inez Catalina M.D.   On: 05/22/2013 18:31   Dg Abd 1 View  05/29/2013   CLINICAL DATA:  Nausea vomiting and diarrhea for 3 days  EXAM: ABDOMEN - 1 VIEW  COMPARISON:  None.  FINDINGS: No abnormally dilated loops of bowel. Bowel gas pattern is unremarkable. Mild aortic calcification noted. No abnormal opacities otherwise.  IMPRESSION: Bowel gas pattern within normal limits   Electronically Signed   By: Skipper Cliche M.D.   On: 05/29/2013 10:22   US Abdomen Complete  06/03/2013   CLINICAL DATA:  Rule out choledocholithiasis  EXAM: ULTRASOUND  ABDOMEN COMPLETE  COMPARISON:  CT 06/03/2013  FINDINGS: Gallbladder:  No gallstones or wall thickening visualized. No sonographic Murphy sign noted.  Common bile duct:  Diameter: 5.5 mm  Liver:  No focal lesion identified. Within  normal limits in parenchymal echogenicity.  IVC:  No abnormality visualized.  Pancreas:  Limited  Spleen:  Size and appearance within normal limits.  Right Kidney:  Length: 9.7 cm. Complex cyst right lower pole measures 17 x 12 mm with a central echogenic area. This is a subtle lesion on CT but no calcification is noted on CT. Echogenicity within normal limits. No mass or hydronephrosis visualized.  Left Kidney:  Length: 10.2 cm. Echogenicity within normal limits. No mass or hydronephrosis visualized.  Abdominal aorta:  No aneurysm visualized.  Other findings:  Left pleural effusion  IMPRESSION: Negative for gallstones.  No biliary duct dilatation.  Small complex cyst left lower pole.   Electronically Signed   By: Franchot Gallo M.D.   On: 06/03/2013 09:48   Ct Abdomen Pelvis W Contrast  06/03/2013   CLINICAL DATA:  Nausea and vomiting.  Abdominal pain.  EXAM: CT ABDOMEN AND PELVIS WITH CONTRAST  TECHNIQUE: Multidetector CT imaging of the abdomen and pelvis was performed using the standard protocol following bolus administration of intravenous contrast.  CONTRAST:  198mL OMNIPAQUE IOHEXOL 300 MG/ML  SOLN  COMPARISON:  DG ABDOMEN 1V dated 05/29/2013  FINDINGS: Lower Chest: Motion degradation. Left greater than right bibasilar atelectasis. Cardiomegaly. Small left pleural effusion. Small hiatal hernia.  Abdomen/Pelvis: No focal liver lesion. Normal spleen. Underdistended proximal stomach. Mild pancreatic atrophy, without pancreatic ductal dilatation  Borderline intrahepatic biliary ductal dilatation. Gallbladder unremarkable. The common duct measures upper normal at 8 mm on coronal image 30. There is mild mucosal hyper enhancement within. The common duct is difficult to follow, secondary to  the ampulla being positioned far right of midline. There is no convincing evidence of obstructive stone or mass. There is a periampullary descending duodenal diverticulum. Ill definition of fat planes in the porta hepatis, indeterminate. Example image 26/series 2. Edema or minimal fluid adjacent the inferior genu of the duodenum on image 37/series 2 and coronal image 42.  Normal adrenal glands. Too small to characterize lower pole right renal lesion. Normal left kidney.  Atherosclerosis at the origin of bilateral renal arteries. Retroaortic left renal vein. No retroperitoneal or retrocrural adenopathy. Scattered colonic diverticula. Normal terminal ileum. Normal small bowel without abdominal ascites.  No pelvic adenopathy. Pelvic floor laxity. Otherwise normal urinary bladder. Hysterectomy. No adnexal mass or significant free fluid.  Bones/Musculoskeletal:  No acute osseous abnormality.  IMPRESSION: 1. Ill definition of porta hepatis fat planes with minimal edema/fluid adjacent the descending duodenum. Concurrent mucosal hyper enhancement within the common duct and borderline intra/extrahepatic biliary ductal dilatation. Appearance is nonspecific. Correlate with liver function tests to exclude otherwise occult choledocholithiasis. Also correlate with laboratory values to exclude pancreatitis or possible duodenitis. Depending on clinical symptoms, ultrasound should be considered. If choledocholithiasis is a concern, consider MRCP. 2. No other explanation for patient's symptoms. 3. Small left pleural effusion. 4. Pelvic floor laxity.   Electronically Signed   By: Abigail Miyamoto M.D.   On: 06/03/2013 02:09   Dg Chest Port 1 View  06/07/2013   CLINICAL DATA:  Cough.  EXAM: PORTABLE CHEST - 1 VIEW  COMPARISON:  Chest x-ray 05/29/2013.  FINDINGS: Enlarging small left pleural effusion. Atelectasis and/or consolidation in the left base. Right lung is clear. No evidence of pulmonary edema. Heart size is normal. Mediastinal  contours are unremarkable. Orthopedic fixation hardware in the lower cervical spine.  IMPRESSION: 1. Enlarging small left pleural effusion with probable subsegmental atelectasis in the left lower lobe (although underlying airspace consolidation from a aspiration  or pneumonia is difficult to exclude).   Electronically Signed   By: Vinnie Langton M.D.   On: 06/07/2013 19:17   Mr Lambert Mody Cm/mrcp  06/03/2013   CLINICAL DATA:  Abdominal pain with vomiting and diarrhea. Treated for gastroenteritis.  EXAM: MRI ABDOMEN WITHOUT  (INCLUDING MRCP)  TECHNIQUE: Multiplanar multisequence MR imaging of the abdomen was performed. Heavily T2-weighted images of the biliary and pancreatic ducts were obtained, and three-dimensional MRCP images were rendered by post processing.  COMPARISON:  US ABDOMEN COMPLETE dated 06/03/2013; MR L SPINE W/O dated 08/26/2006; CT ABD/PELVIS W CM dated 06/03/2013  FINDINGS: Examination is mildly motion degraded, especially on the thin-section T2 weighted MRCP images. The gallbladder appears normal. There is no evidence of gallstones or gallbladder wall thickening. There is no intra or extrahepatic biliary dilatation. There is no evidence of choledocholithiasis.  There is no evidence of pancreatic mass, focal surrounding fluid collection or pancreatic ductal dilatation. The pancreatic duct is not well visualized, although there is no definite evidence of pancreas divisum. The pancreas enhances normally following contrast.  Better seen on the recent CT is wall thickening of the distal stomach and proximal duodenum with surrounding inflammatory change. There is a periampullary duodenum diverticulum.  The liver, spleen and adrenal glands appear normal. There is a small right renal cyst. No hydronephrosis.  Left pleural effusion and adjacent left lower lobe airspace disease/atelectasis are noted.  IMPRESSION: 1. No evidence of biliary dilatation or choledocholithiasis. 2. Right upper quadrant inflammatory  process with epicenter on the proximal duodenum, suggesting duodenitis or peptic ulcer disease. No evidence of pancreatitis or focal fluid collection. 3. Left pleural effusion with adjacent left lower lobe atelectasis or infiltrate.   Electronically Signed   By: Camie Patience M.D.   On: 06/03/2013 18:56    Microbiology: Recent Results (from the past 240 hour(s))  CLOSTRIDIUM DIFFICILE BY PCR     Status: None   Collection Time    06/06/13  7:49 PM      Result Value Range Status   C difficile by pcr NEGATIVE  NEGATIVE Final     Labs: Basic Metabolic Panel: No results found for this basename: NA, K, CL, CO2, GLUCOSE, BUN, CREATININE, CALCIUM, MG, PHOS,  in the last 168 hours Liver Function Tests: No results found for this basename: AST, ALT, ALKPHOS, BILITOT, PROT, ALBUMIN,  in the last 168 hours No results found for this basename: LIPASE, AMYLASE,  in the last 168 hours No results found for this basename: AMMONIA,  in the last 168 hours CBC: No results found for this basename: WBC, NEUTROABS, HGB, HCT, MCV, PLT,  in the last 168 hours Cardiac Enzymes: No results found for this basename: CKTOTAL, CKMB, CKMBINDEX, TROPONINI,  in the last 168 hours BNP: BNP (last 3 results) No results found for this basename: PROBNP,  in the last 8760 hours CBG: No results found for this basename: GLUCAP,  in the last 168 hours     Signed:  Cristal Ford  [facility] 06/11/2013, 9:20 AM

## 2013-06-11 NOTE — Progress Notes (Signed)
Discharge instructions reviewed with pt and pt's husband and prescription given.  Pt and pt's husband verbalized understanding and questions answered.  Pt discharged in stable condition via wheelchair with husband.  Eliezer Bottom [place]

## 2013-06-16 ENCOUNTER — Ambulatory Visit (INDEPENDENT_AMBULATORY_CARE_PROVIDER_SITE_OTHER): Payer: Medicare Other | Admitting: Internal Medicine

## 2013-06-16 ENCOUNTER — Ambulatory Visit (HOSPITAL_COMMUNITY)
Admission: RE | Admit: 2013-06-16 | Discharge: 2013-06-16 | Disposition: A | Discharge status: Home / Self Care | Payer: Medicare Other | Source: Ambulatory Visit | Attending: Internal Medicine | Admitting: Internal Medicine

## 2013-06-16 ENCOUNTER — Encounter: Payer: Self-pay | Admitting: Internal Medicine

## 2013-06-16 VITALS — BP 144/72 | HR 96 | Temp 98.3°F | Wt 106.8 lb

## 2013-06-16 DIAGNOSIS — J90 Pleural effusion, not elsewhere classified: Secondary | ICD-10-CM

## 2013-06-16 DIAGNOSIS — D649 Anemia, unspecified: Secondary | ICD-10-CM

## 2013-06-16 DIAGNOSIS — N281 Cyst of kidney, acquired: Secondary | ICD-10-CM

## 2013-06-16 DIAGNOSIS — K264 Chronic or unspecified duodenal ulcer with hemorrhage: Secondary | ICD-10-CM

## 2013-06-16 DIAGNOSIS — K219 Gastro-esophageal reflux disease without esophagitis: Secondary | ICD-10-CM

## 2013-06-16 DIAGNOSIS — Q619 Cystic kidney disease, unspecified: Secondary | ICD-10-CM

## 2013-06-16 NOTE — Patient Instructions (Signed)
Sorry you have had such a run of bad luck.,  GI- you did have a duodenal bleed and swelling. The Protonix will help a lot. Not sure why you are having belching and the bad taste. Your labs looked ok. Plan Keep appointment with GI  Continue the protonix twice a day until the GI doctors cut it back to once a day.  Continue the Ensure and try to eat what you can - no food restrictions  OK to take metamucil or other bulk laxative - this will help regulate your bowels.  Lungs - you have had a left pleural effusion - fluid outside the lung. There was no evidence of pneumonia/infection at the time of hospital discharge. This will still cause pleuritic pain with deep breath Plan Will get follow up chest x-ray today and a lateral (on your side) x-ray to see if this fluid flows. Further plans to be based on findings.  Cyst on the Kidney - this is a small lesion, not looking like a cancer. It was seen on ultrasound and MRI. This is unlikely to be dangerous Plan Follow up renal ultrasound in 3 months to monitor this cyst.  Shoulder - you need to resume physical therapy as soon as you can.  Follow up - please come see me in early March.

## 2013-06-16 NOTE — Progress Notes (Signed)
Pre visit review using our clinic review tool, if applicable. No additional management support is needed unless otherwise documented below in the visit note. 

## 2013-06-16 NOTE — Progress Notes (Signed)
Subjective:    Patient ID: Karla Roman, female    DOB: 1941/02/07, 73 y.o.   MRN: [id number]  HPI Karla Roman presents for hospital follow up after admission 1/22-1/27 for melena. Hospital record reviewed and images reviewed with patient along with reports. She had Abd U/S, CT abd/pelvis suggesting 1.7x.12cm cyst right kidney in the lower lobe, no gallbladder disease but possible ductal dilitation. MRA abdomen without ductal dilitation or pancreatic disease. Serial CXR with left pleural effusion and atelectasis vs infiltrate. She came to EGD which revealed duodenal ulcer and inflammation with swelling leading to partial gastric outlet obstruction. She was put to BID PPI therapy for the ulcer.   Since d/c she has had no appetite, food with very bad taste. She taking ensure and fluids but she is not eating much. She continues to have pleuritic pain on the left. She has mild SOB with DOE. She is tolerating medication. She does report a burning substernal pain when supine. She has increased eructation as well. She does c/o a dry cough.  Past Medical History  Diagnosis Date  . Diverticulitis   . GERD (gastroesophageal reflux disease)   . Atrophic vaginitis   . MVP (mitral valve prolapse)   . Vertigo   . Osteopenia   . Eustachian tube dysfunction   . Anemia     in childhood  . Hiatal hernia   . Sleep apnea 10/2004    Sleep study by Dr Gwenette Greet.   Marland Kitchen Dyspareunia   . Diverticulosis   . Hx of adenomatous colonic polyps 02/2009    Tubular adenoma in sigmoid. Diminutive.   . Schatzki's ring 1995  . Allergic rhinitis   . Dysplastic nevus 09/2012, 10/2012    right thigh, severe atypia, resected 10/2012 by Dr Jarome Matin   Past Surgical History  Procedure Laterality Date  . Appendectomy  1989  . Abdominal hysterectomy      with anterior and posterior vaginal repair  . Tonsillectomy    . Breast biopsy      RIGHT  . Septoplasty    . Cervical discectomy  01/2004    C3-4, C5-6, C6-7  .  Laparoscopy  04/2000    with lysis of adhesions  . Shoulder surgery Left 01/2004    release of adhesive capsulitis  . Depuytren's contraction  04/2006    right  . Dysplastic nevus  10/2012    right thigh  . Right shoulder fracture Right 03/2013    non-displaced, humeral head, non-operative mgt but she developed frozen shoulder.   . Esophagogastroduodenoscopy N/A 06/04/2013    Procedure: ESOPHAGOGASTRODUODENOSCOPY (EGD);  Surgeon: Lafayette Dragon, MD;  Location[facility] ENDOSCOPY;  Service: Endoscopy;  Laterality: N/A;   Family History  Problem Relation Age of Onset  . Heart attack Father     after years of angina  . Coronary artery disease Father   . Coronary artery disease Mother   . Breast cancer Maternal Aunt   . Cancer Maternal Aunt     breast, colon, ovarian  . Diabetes Brother   . Coronary artery disease Brother   . Hypertension Sister   . Hyperthyroidism Sister   . Coronary artery disease Brother     PCI-stents   History   Social History  . Marital Status: Married    Spouse Name: Jazel Nimmons    Number of Children: 2  . Years of Education: 12   Occupational History  . Retired from [employer]  .  Smoking status: Never Smoker   . Smokeless tobacco: Never Used  . Alcohol Use: No  . Drug Use: No  . Sexual Activity: Not Currently   Other Topics Concern  . Not on file   Social History Narrative   HSG. Married 1959. 1 son - ''8; 1 dtr - '80; 6 grandchildren (1 set of twins). Marriage is in good health - SO with rectal cancer. Work - retired.      Current Outpatient Prescriptions on File Prior to Visit  Medication Sig Dispense Refill  . acetaminophen (TYLENOL) 500 MG tablet Take 500-1,000 mg by mouth every 6 (six) hours as needed for mild pain or headache.      . Calcium Carbonate (CALTRATE 600) 1500 MG TABS Take 1 tablet by mouth 2 (two) times daily. chewables      . cyanocobalamin 500 MCG tablet Take 500 mcg by mouth every morning.       . feeding supplement, ENSURE COMPLETE, (ENSURE COMPLETE) LIQD Take 237 mLs by mouth 2 (two) times daily between meals.      Marland Kitchen loperamide (IMODIUM) 2 MG capsule Take 1 capsule (2 mg total) by mouth as needed for diarrhea or loose stools.  30 capsule  0  . Multiple Vitamins-Minerals (MULTIVITAMIN WITH MINERALS) tablet Take 1 tablet by mouth every morning. chewables      . omega-3 acid ethyl esters (LOVAZA) 1 G capsule Take 1 g by mouth every morning.      . ondansetron (ZOFRAN) 4 MG tablet Take 1 tablet (4 mg total) by mouth every 6 (six) hours as needed for nausea or vomiting.  20 tablet  0  . pantoprazole (PROTONIX) 40 MG tablet Take 1 tablet (40 mg total) by mouth 2 (two) times daily.  60 tablet  0  . saccharomyces boulardii (FLORASTOR) 250 MG capsule Take 1 capsule (250 mg total) by mouth 2 (two) times daily.  60 capsule  0  . vitamin E 400 UNIT capsule Take 400 Units by mouth every morning.        No current facility-administered medications on file prior to visit.      Review of Systems System review is negative for any constitutional, cardiac, pulmonary, GI or neuro symptoms or complaints other than as described in the HPI.     Objective:   Physical Exam Filed Vitals:   06/16/13 0936  BP: 144/72  Pulse: 96  Temp: 98.3 F (36.8 C)   Wt Readings from Last 3 Encounters:  06/16/13 106 lb 12.8 oz (48.444 kg)  06/09/13 117 lb 8.1 oz (53.3 kg)  06/09/13 117 lb 8.1 oz (53.3 kg)   BP Readings from Last 3 Encounters:  06/16/13 144/72  06/11/13 116/49  06/11/13 116/49   Gen'l- haggard appearing woman in no acute distress HEENT - no icterus Neck - supple Cor 2+ radial, RRR Pulm - good breath sounds, no rales or wheezing, no pleuritic rub Abd- BS+ x 4 quadrants, tender at the RUQ (over the duodenum), no guarding or rebound.     Assessment & Plan:

## 2013-06-17 ENCOUNTER — Other Ambulatory Visit: Payer: Self-pay | Admitting: Internal Medicine

## 2013-06-17 DIAGNOSIS — J90 Pleural effusion, not elsewhere classified: Secondary | ICD-10-CM

## 2013-06-17 NOTE — Assessment & Plan Note (Signed)
Persistent pleural effusion by imaging during Hospital stay Jan '15. Scan evidence of PNA with normal WBC, no fever, no sputum production. She does have pleuritic left-sided chest pain with deep inspiration but no pleural rub on exam.  Plan 2 V CXR and left lateral decubitus film - if loculated effusion may need pulmonary consult.   Addendum: CHEST - LEFT DECUBITUS  COMPARISON: DG CHEST 2 VIEW dated 06/16/2013; DG CHEST 2 VIEW dated  06/08/2013  FINDINGS:  A layering left pleural effusion noted on decubitus view.  Atelectasis and/or infiltrate left lung base. Heart size normal.  IMPRESSION:  Layering left pleural effusion  Plan - follow up in 1 weeks.

## 2013-06-17 NOTE — Assessment & Plan Note (Signed)
Reviewed studies, U/S, CT abd/pelvis,MRI abdomen, with patient. Reassured her that this cyst is not an acute problem and does not appear to be a frank RCC  Plan Repeat renal U/S May '15 to gage any progression of cyst  For progressive cyst will refer to either GU or IR

## 2013-06-17 NOTE — Assessment & Plan Note (Signed)
Lab Results  Component Value Date   HGB 11.4* 06/04/2013   No medical therapy needed. Routine follow up lab.

## 2013-06-17 NOTE — Assessment & Plan Note (Signed)
Duodenal ulcer with inflammation and partial gastric outlet obstruction. No major loss of blood. D/C home on BID PPI therapy. Continued anorexia, reflux type symptoms.  Plan Continue PPI therapy  Keep appointment with GI  BRAT/bland diet

## 2013-06-17 NOTE — Assessment & Plan Note (Signed)
With UGI bleed Jan '15 she has been switched to BID PPI therapy. She is still having some symptoms.  Plan Continue PPI therapy  For continued reflux symptoms may consider Carafate suspension in addition to PPI

## 2013-06-18 ENCOUNTER — Telehealth: Payer: Self-pay | Admitting: *Deleted

## 2013-06-18 NOTE — Telephone Encounter (Signed)
Patient phoned triage line this morning requesting test results.  Please advise.   CB# [phone]

## 2013-06-18 NOTE — Telephone Encounter (Signed)
Called patient - explained she had a free flowing effusion and that she will have a follow up cxr 2/16 - order is in

## 2013-06-28 ENCOUNTER — Ambulatory Visit (INDEPENDENT_AMBULATORY_CARE_PROVIDER_SITE_OTHER): Payer: Medicare Other | Admitting: Gastroenterology

## 2013-06-28 ENCOUNTER — Ambulatory Visit (INDEPENDENT_AMBULATORY_CARE_PROVIDER_SITE_OTHER)
Admission: RE | Admit: 2013-06-28 | Discharge: 2013-06-28 | Disposition: A | Discharge status: Home / Self Care | Payer: Medicare Other | Source: Ambulatory Visit | Attending: Internal Medicine | Admitting: Internal Medicine

## 2013-06-28 ENCOUNTER — Encounter: Payer: Self-pay | Admitting: Gastroenterology

## 2013-06-28 VITALS — BP 124/66 | HR 80 | Ht 62.0 in | Wt 108.6 lb

## 2013-06-28 DIAGNOSIS — J90 Pleural effusion, not elsewhere classified: Secondary | ICD-10-CM

## 2013-06-28 DIAGNOSIS — K269 Duodenal ulcer, unspecified as acute or chronic, without hemorrhage or perforation: Secondary | ICD-10-CM

## 2013-06-28 DIAGNOSIS — K219 Gastro-esophageal reflux disease without esophagitis: Secondary | ICD-10-CM

## 2013-06-28 MED ORDER — PANTOPRAZOLE SODIUM 40 MG PO TBEC: DELAYED_RELEASE_TABLET | ORAL | Status: DC

## 2013-06-28 NOTE — Patient Instructions (Signed)
We sent a 90 day prescription to [facility] for Pantoprazole Sodium 40 mg. We made you a follow up appointment with Dr. Delfin Edis on 08-03-2013 at 10:15 am.

## 2013-06-28 NOTE — Progress Notes (Signed)
06/28/2013 Karla Roman [id number] 08-16-1940   History of Present Illness:  This is a pleasant 73 year old woman who is known to Dr. Olevia Perches.  She is here today for follow-up after hospitalization in January.  She had nausea and vomiting (some CGE) with abdominal pain that was initially thought to be gastroenteritis.  She eventually underwent EGD on 1/23, which showed marked duodenal bulb ulceration with partial duodenal outlet obstruction.  Biopsies showed benign mucosa with ulceration and reactive changes, negative for Hpylori.  Hpylori IgG was negative and serum gastrin level was normal as well.  She denies taking NSAID's but was taking an ASA 81 mg (not cardiology recommended), which has now been held.  She had previously been taking pepcid for acid reflux, but was placed on pantoprazole 40 mg BID and is still taking that.  She is feeling much better.  Still has some discomfort in her epigastrium.  Appetite is coming back slowly.    Just of note, her last colonoscopy was in 02/2009 at which time she was found to have moderate diverticulosis throughout the colon and a diminutive polyp in the sigmoid colon.  She is in for a colonoscopy recall in 02/2014.   Current Medications, Allergies, Past Medical History, Past Surgical History, Family History and Social History were reviewed in [employer] record.   Physical Exam: BP 124/66  Pulse 80  Ht 5\' 2"  (1.575 m)  Wt 108 lb 9.6 oz (49.261 kg)  BMI 19.86 kg/m2 General: Well developed white female in no acute distress Head: Normocephalic and atraumatic Eyes:  Sclerae anicteric, conjunctiva pink  Ears: Normal auditory acuity Lungs: Clear throughout to auscultation Heart: Regular rate and rhythm Abdomen: Soft, non-distended.  Normal bowel sounds.  Mild epigastric/RUQ TTP without R/R/G. Musculoskeletal: Symmetrical with no gross deformities  Extremities: No edema  Neurological: Alert oriented x 4, grossly  non-focal Psychological:  Alert and cooperative. Normal mood and affect.  Assessment and Recommendations: -Duodenal ulcer with partial outlet obstruction:  Hpylori negative and serum gastrin level normal.  She will continue off of her ASA 81 mg for now.  Will continue pantoprazole 40 mg BID for 4 weeks then once daily until her follow-up with Dr. Olevia Perches in 2-3 months.  Avoid NSAID's. -GERD:  Continue PPI as above. -History of tubular adenoma on colonoscopy in 02/2009:  Is in for colonoscopy recall in 02/2014.

## 2013-06-28 NOTE — Progress Notes (Signed)
Reviewed and agree.

## 2013-07-02 ENCOUNTER — Encounter: Payer: Self-pay | Admitting: *Deleted

## 2013-07-20 ENCOUNTER — Ambulatory Visit (INDEPENDENT_AMBULATORY_CARE_PROVIDER_SITE_OTHER)
Admission: RE | Admit: 2013-07-20 | Discharge: 2013-07-20 | Disposition: A | Discharge status: Home / Self Care | Payer: Medicare Other | Source: Ambulatory Visit | Attending: Internal Medicine | Admitting: Internal Medicine

## 2013-07-20 ENCOUNTER — Encounter: Payer: Self-pay | Admitting: Internal Medicine

## 2013-07-20 ENCOUNTER — Ambulatory Visit (INDEPENDENT_AMBULATORY_CARE_PROVIDER_SITE_OTHER): Payer: Medicare Other | Admitting: Internal Medicine

## 2013-07-20 VITALS — BP 120/68 | HR 91 | Temp 96.0°F | Resp 14 | Ht 62.0 in | Wt 106.2 lb

## 2013-07-20 DIAGNOSIS — Q619 Cystic kidney disease, unspecified: Secondary | ICD-10-CM

## 2013-07-20 DIAGNOSIS — J90 Pleural effusion, not elsewhere classified: Secondary | ICD-10-CM

## 2013-07-20 DIAGNOSIS — K264 Chronic or unspecified duodenal ulcer with hemorrhage: Secondary | ICD-10-CM

## 2013-07-20 DIAGNOSIS — N281 Cyst of kidney, acquired: Secondary | ICD-10-CM

## 2013-07-20 NOTE — Progress Notes (Signed)
   Subjective:    Patient ID: [name], female    DOB: December 01, 1940, 73 y.o.   MRN: [id number]  HPI Ms. Potier presents for follow up of pleural effusion and s/p GI bleed. She has seen Ms. Myrtice Lauth in February and got a good report. She has been feeling a lot better but still has a little catch in the left chest with a deep breath. Her appetite has picked up.  She is scheduled for f/u renal u/s in May.   She has resumed therapy.   PMH, FamHx and SocHx reviewed for any changes and relevance.  Current Outpatient Prescriptions on File Prior to Visit  Medication Sig Dispense Refill  . acetaminophen (TYLENOL) 500 MG tablet Take 500-1,000 mg by mouth every 6 (six) hours as needed for mild pain or headache.      . Calcium Carbonate (CALTRATE 600) 1500 MG TABS Take 1 tablet by mouth 2 (two) times daily. chewables      . cyanocobalamin 500 MCG tablet Take 500 mcg by mouth every morning.      . feeding supplement, ENSURE COMPLETE, (ENSURE COMPLETE) LIQD Take 237 mLs by mouth 2 (two) times daily between meals.      Marland Kitchen loperamide (IMODIUM) 2 MG capsule Take 1 capsule (2 mg total) by mouth as needed for diarrhea or loose stools.  30 capsule  0  . Multiple Vitamins-Minerals (MULTIVITAMIN WITH MINERALS) tablet Take 1 tablet by mouth every morning. chewables      . omega-3 acid ethyl esters (LOVAZA) 1 G capsule Take 1 g by mouth every morning.      . pantoprazole (PROTONIX) 40 MG tablet Take 1 tab daily before breakfast.  90 tablet  3  . Psyllium (METAMUCIL PO) Take by mouth daily.      Marland Kitchen saccharomyces boulardii (FLORASTOR) 250 MG capsule Take 1 capsule (250 mg total) by mouth 2 (two) times daily.  60 capsule  0  . vitamin E 400 UNIT capsule Take 400 Units by mouth every morning.        No current facility-administered medications on file prior to visit.      Review of Systems System review is negative for any constitutional, cardiac, pulmonary, GI or neuro symptoms or complaints other than as  described in the HPI.     Objective:   Physical Exam Filed Vitals:   07/20/13 0921  BP: 120/68  Pulse: 91  Temp: 96 F (35.6 C)  Resp: 14   Wt Readings from Last 3 Encounters:  07/20/13 106 lb 4 oz (48.195 kg)  06/28/13 108 lb 9.6 oz (49.261 kg)  06/16/13 106 lb 12.8 oz (48.444 kg)   Gen'l slender woman in no distress HEENT - mild cerumen right EAC but not obstructed Cor - RRR Pulm - CTAP, no rub, no rales. Neuro - stable.        Assessment & Plan:

## 2013-07-20 NOTE — Assessment & Plan Note (Signed)
For f/u Renal u/s in May '15 with recommendations to follow.

## 2013-07-20 NOTE — Progress Notes (Signed)
Pre visit review using our clinic review tool, if applicable. No additional management support is needed unless otherwise documented below in the visit note. 

## 2013-07-20 NOTE — Patient Instructions (Signed)
You seem to be doing well.   For GI - please continue the protonix until Dr. Olevia Perches changes your medication.  Pleural effusion - your lungs sound clear today. Will check a final chest x-ray to be sure you have cleared.  Renal cyst - these are usually benign. Will get the ultrasound in May and if there is a change will refer to urology.  For continuing care I will transfer your care to Dr. Ronnald Ramp.

## 2013-07-20 NOTE — Assessment & Plan Note (Signed)
Has seen Ms. Karla Roman in GI. She is resuming once daily PPI therapy. She has no pain and is doing well. She is to see Dr. Olevia Perches in 2 months.

## 2013-07-20 NOTE — Assessment & Plan Note (Signed)
Normal exam today.  F/u CXR: FINDINGS:  The heart size and vascular pattern are normal. Bilateral pleural  apical thickening, right greater than left, is stable. No  consolidation or effusion currently.  IMPRESSION:  No active cardiopulmonary disease.

## 2013-08-03 ENCOUNTER — Ambulatory Visit (INDEPENDENT_AMBULATORY_CARE_PROVIDER_SITE_OTHER): Payer: Medicare Other | Admitting: Internal Medicine

## 2013-08-03 ENCOUNTER — Encounter: Payer: Self-pay | Admitting: Internal Medicine

## 2013-08-03 VITALS — BP 124/68 | HR 84 | Ht 62.0 in | Wt 107.1 lb

## 2013-08-03 DIAGNOSIS — K269 Duodenal ulcer, unspecified as acute or chronic, without hemorrhage or perforation: Secondary | ICD-10-CM

## 2013-08-03 NOTE — Progress Notes (Signed)
Karla Roman [birth year] [id number]  Note: This dictation was prepared with Dragon digital system. Any transcriptional errors that result from this procedure are unintentional.   History of Present Illness:  This is a 73 year old, white female who is post hospitalization for duodenal ulcer with partial duodenal obstruction. She was H. pylori negative. An upper endoscopy showed  deformed pylorus. She initially presented with nausea and vomiting. She is doing very well and denies abdominal pain, rectal bleeding, nausea or vomiting. Her appetite has been excellent. Her last colonoscopy in October 2010 showed a tubular adenoma. There is a family history of colon polyps. She will be due for a recall colonoscopy in October 2015.    Past Medical History  Diagnosis Date  . Diverticulitis   . GERD (gastroesophageal reflux disease)   . Atrophic vaginitis   . MVP (mitral valve prolapse)   . Vertigo   . Osteopenia   . Eustachian tube dysfunction   . Anemia     in childhood  . Hiatal hernia   . Sleep apnea 10/2004    Sleep study by Dr Gwenette Greet.   Marland Kitchen Dyspareunia   . Diverticulosis   . Hx of adenomatous colonic polyps 02/2009    Tubular adenoma in sigmoid. Diminutive.   . Schatzki's ring 1995  . Allergic rhinitis   . Dysplastic nevus 09/2012, 10/2012    right thigh, severe atypia, resected 10/2012 by Dr Jarome Matin  . Duodenal ulcer   . Esophagitis     grade 1    Past Surgical History  Procedure Laterality Date  . Appendectomy  1989  . Abdominal hysterectomy      with anterior and posterior vaginal repair  . Tonsillectomy    . Breast biopsy      RIGHT  . Septoplasty    . Cervical discectomy  01/2004    C3-4, C5-6, C6-7  . Laparoscopy  04/2000    with lysis of adhesions  . Shoulder surgery Left 01/2004    release of adhesive capsulitis  . Depuytren's contraction  04/2006    right  . Dysplastic nevus  10/2012    right thigh  . Right shoulder fracture Right 03/2013    non-displaced,  humeral head, non-operative mgt but she developed frozen shoulder.   . Esophagogastroduodenoscopy N/A 06/04/2013    Procedure: ESOPHAGOGASTRODUODENOSCOPY (EGD);  Surgeon: Lafayette Dragon, MD;  Location: [facility] ENDOSCOPY;  Service: Endoscopy;  Laterality: N/A;    Allergies  Allergen Reactions  . Codeine Other (See Comments)    Anxiety, restlessness  . Levaquin [Levofloxacin]     Nausea, vomiting, diarrhea.    Family history and social history have been reviewed.  Review of Systems: Denies heartburn nausea  The remainder of the 10 point ROS is negative except as outlined in the H&P  Physical Exam: General Appearance thin, in no distress Eyes  Non icteric  HEENT  Non traumatic, normocephalic  Mouth No lesion, tongue papillated, no cheilosis Neck Supple without adenopathy, thyroid not enlarged, no carotid bruits, no JVD Lungs Clear to auscultation bilaterally COR Normal S1, normal S2, regular rhythm, no murmur, quiet precordium Abdomen soft nontender abdomen with normal active bowel sounds. No distention. No tympany. Liver edge at costal margin Rectal not done Extremities  No pedal edema Skin No lesions Neurological Alert and oriented x 3 Psychological Normal mood and affect  Assessment and Plan:   Problem #1 Duodenal ulcer with partial duodenal outlet obstruction on an upper endoscopy in January 2015. She is clinically improved. She  is to continue on pantoprazole 40 mg daily. She has no symptoms of obstruction  Problem #2 History of tubular adenoma on her last colonoscopy in October 2010. A recall colonoscopy is planned for October 2015. We will also proceed with an upper endoscopy at that time to check her duodenal outlet.    Delfin Edis 08/03/2013

## 2013-08-03 NOTE — Patient Instructions (Addendum)
You will be due for a recall colonoscopy in 02/2014. We will send you a reminder in the mail when it gets closer to that time.  CC: Dr Linda Hedges

## 2013-08-18 ENCOUNTER — Encounter: Payer: Self-pay | Admitting: Internal Medicine

## 2013-09-02 ENCOUNTER — Encounter: Payer: Self-pay | Admitting: Internal Medicine

## 2013-09-02 ENCOUNTER — Other Ambulatory Visit (INDEPENDENT_AMBULATORY_CARE_PROVIDER_SITE_OTHER): Payer: Medicare Other

## 2013-09-02 ENCOUNTER — Ambulatory Visit (INDEPENDENT_AMBULATORY_CARE_PROVIDER_SITE_OTHER): Payer: Medicare Other | Admitting: Internal Medicine

## 2013-09-02 VITALS — BP 118/64 | HR 71 | Temp 98.0°F | Resp 16 | Ht 62.0 in | Wt 106.2 lb

## 2013-09-02 DIAGNOSIS — D510 Vitamin B12 deficiency anemia due to intrinsic factor deficiency: Secondary | ICD-10-CM

## 2013-09-02 DIAGNOSIS — M949 Disorder of cartilage, unspecified: Secondary | ICD-10-CM

## 2013-09-02 DIAGNOSIS — M899 Disorder of bone, unspecified: Secondary | ICD-10-CM

## 2013-09-02 LAB — CBC WITH DIFFERENTIAL/PLATELET
Basophils Absolute: 0 10*3/uL (ref 0.0–0.1)
Basophils Relative: 0.3 % (ref 0.0–3.0)
Eosinophils Absolute: 0.1 10*3/uL (ref 0.0–0.7)
Eosinophils Relative: 2.2 % (ref 0.0–5.0)
HCT: 37.8 % (ref 36.0–46.0)
Hemoglobin: 12.6 g/dL (ref 12.0–15.0)
Lymphocytes Relative: 29.5 % (ref 12.0–46.0)
Lymphs Abs: 1.6 10*3/uL (ref 0.7–4.0)
MCHC: 33.3 g/dL (ref 30.0–36.0)
MCV: 92.8 fl (ref 78.0–100.0)
Monocytes Absolute: 0.5 10*3/uL (ref 0.1–1.0)
Monocytes Relative: 8.6 % (ref 3.0–12.0)
Neutro Abs: 3.3 10*3/uL (ref 1.4–7.7)
Neutrophils Relative %: 59.4 % (ref 43.0–77.0)
Platelets: 236 10*3/uL (ref 150.0–400.0)
RBC: 4.07 Mil/uL (ref 3.87–5.11)
RDW: 14.4 % (ref 11.5–14.6)
WBC: 5.5 10*3/uL (ref 4.5–10.5)

## 2013-09-02 LAB — FOLATE: Folate: >24.8 ng/mL (ref >5.9)

## 2013-09-02 LAB — VITAMIN B12: Vitamin B-12: 1040 pg/mL — ABNORMAL HIGH (ref 211–911)

## 2013-09-02 LAB — IBC PANEL
Iron: 55 ug/dL (ref 42–145)
Saturation Ratios: 16.2 % — ABNORMAL LOW (ref 20.0–50.0)
Transferrin: 243 mg/dL (ref 212.0–360.0)

## 2013-09-02 LAB — TSH: TSH: 0.88 u[IU]/mL (ref 0.35–5.50)

## 2013-09-02 LAB — FERRITIN: Ferritin: 32.3 ng/mL (ref 10.0–291.0)

## 2013-09-02 LAB — T3, FREE: T3, Free: 2.7 pg/mL (ref 2.3–4.2)

## 2013-09-02 NOTE — Patient Instructions (Signed)

## 2013-09-02 NOTE — Progress Notes (Signed)
Pre visit review using our clinic review tool, if applicable. No additional management support is needed unless otherwise documented below in the visit note. 

## 2013-09-02 NOTE — Progress Notes (Signed)
   Subjective:    Patient ID: Karla Roman, female    DOB: Jul 03, 1940, 73 y.o.   MRN: [id number]  HPI Comments: New to me, she complains of thinning hair  Anemia Presents for follow-up visit. There has been no abdominal pain, anorexia, bruising/bleeding easily, confusion, fever, leg swelling, light-headedness, malaise/fatigue, pallor, palpitations, paresthesias, pica or weight loss. Signs of blood loss that are not present include hematemesis, hematochezia, melena and menorrhagia. Past treatments include oral vitamin B12 and oral iron supplements. Past medical history includes malnutrition, recent illness and recent surgery. There is no history of alcohol abuse. Procedure history includes EGD.      Review of Systems  Constitutional: Negative.  Negative for fever, chills, weight loss, malaise/fatigue, diaphoresis, appetite change and fatigue.  HENT: Negative.   Eyes: Negative.   Respiratory: Negative.  Negative for cough, choking, chest tightness, shortness of breath, wheezing and stridor.   Cardiovascular: Negative.  Negative for chest pain, palpitations and leg swelling.  Gastrointestinal: Negative.  Negative for nausea, vomiting, abdominal pain, diarrhea, constipation, blood in stool, melena, hematochezia, anorexia and hematemesis.  Endocrine: Negative.   Genitourinary: Negative.  Negative for menorrhagia.  Musculoskeletal: Negative.   Skin: Negative.  Negative for pallor.  Allergic/Immunologic: Negative.   Neurological: Negative.  Negative for dizziness, tremors, weakness, light-headedness, numbness, headaches and paresthesias.  Hematological: Negative.  Negative for adenopathy. Does not bruise/bleed easily.  Psychiatric/Behavioral: Negative.  Negative for confusion.       Objective:   Physical Exam  Vitals reviewed. Constitutional: She is oriented to person, place, and time. She appears well-developed and well-nourished. No distress.  HENT:  Head: Normocephalic and  atraumatic.  Mouth/Throat: Oropharynx is clear and moist. No oropharyngeal exudate.  Eyes: Conjunctivae are normal. Right eye exhibits no discharge. Left eye exhibits no discharge. No scleral icterus.  Neck: Normal range of motion. Neck supple. No JVD present. No tracheal deviation present. No thyromegaly present.  Cardiovascular: Normal rate, regular rhythm, normal heart sounds and intact distal pulses.  Exam reveals no gallop and no friction rub.   No murmur heard. Pulmonary/Chest: Effort normal and breath sounds normal. No stridor. No respiratory distress. She has no wheezes. She has no rales. She exhibits no tenderness.  Abdominal: Soft. Bowel sounds are normal. She exhibits no distension and no mass. There is no tenderness. There is no rebound and no guarding.  Musculoskeletal: Normal range of motion. She exhibits no edema and no tenderness.  Lymphadenopathy:    She has no cervical adenopathy.  Neurological: She is oriented to person, place, and time.  Skin: Skin is warm and dry. No rash noted. She is not diaphoretic. No erythema. No pallor.  Her scalp and hair look normal to me today  Psychiatric: She has a normal mood and affect. Her behavior is normal. Judgment and thought content normal.     Lab Results  Component Value Date   WBC 6.3 06/04/2013   HGB 11.4* 06/04/2013   HCT 34.3* 06/04/2013   PLT 347 06/04/2013   GLUCOSE 85 06/04/2013   CHOL 221* 12/23/2012   TRIG 56.0 12/23/2012   HDL 88.90 12/23/2012   LDLDIRECT 115.1 12/23/2012   ALT 17 06/03/2013   AST 37 06/03/2013   NA 140 06/04/2013   K 4.4 06/04/2013   CL 102 06/04/2013   CREATININE 0.57 06/04/2013   BUN 8 06/04/2013   CO2 25 06/04/2013   TSH 1.22 12/23/2012       Assessment & Plan:

## 2013-09-03 ENCOUNTER — Encounter: Payer: Self-pay | Admitting: Internal Medicine

## 2013-09-03 LAB — VITAMIN D 25 HYDROXY (VIT D DEFICIENCY, FRACTURES): Vit D, 25-Hydroxy: 67 ng/mL (ref 30–89)

## 2013-09-03 LAB — T4: T4, Total: 8.3 ug/dL (ref 5.0–12.5)

## 2013-09-03 NOTE — Assessment & Plan Note (Signed)
Her Vit D level is normal now 

## 2013-09-03 NOTE — Assessment & Plan Note (Addendum)
Her CBC is normal now and her vitamin levels are normal as well I think her hair loss is due to the recent physical stresses and illnesses, I have asked to start using rogaine on her scalp

## 2013-09-13 ENCOUNTER — Encounter: Payer: Self-pay | Admitting: Internal Medicine

## 2013-09-13 ENCOUNTER — Ambulatory Visit (HOSPITAL_COMMUNITY)
Admission: RE | Admit: 2013-09-13 | Discharge: 2013-09-13 | Disposition: A | Discharge status: Home / Self Care | Payer: Medicare Other | Source: Ambulatory Visit | Attending: Internal Medicine | Admitting: Internal Medicine

## 2013-09-13 DIAGNOSIS — N281 Cyst of kidney, acquired: Secondary | ICD-10-CM

## 2013-09-21 ENCOUNTER — Encounter: Payer: Self-pay | Admitting: Internal Medicine

## 2013-09-21 ENCOUNTER — Ambulatory Visit (INDEPENDENT_AMBULATORY_CARE_PROVIDER_SITE_OTHER): Payer: Medicare Other | Admitting: Internal Medicine

## 2013-09-21 VITALS — BP 114/68 | HR 71 | Temp 98.6°F | Resp 16 | Ht 62.0 in | Wt 101.2 lb

## 2013-09-21 DIAGNOSIS — M25561 Pain in right knee: Secondary | ICD-10-CM

## 2013-09-21 DIAGNOSIS — M899 Disorder of bone, unspecified: Secondary | ICD-10-CM

## 2013-09-21 DIAGNOSIS — N281 Cyst of kidney, acquired: Secondary | ICD-10-CM

## 2013-09-21 DIAGNOSIS — J90 Pleural effusion, not elsewhere classified: Secondary | ICD-10-CM

## 2013-09-21 DIAGNOSIS — K219 Gastro-esophageal reflux disease without esophagitis: Secondary | ICD-10-CM

## 2013-09-21 DIAGNOSIS — Q619 Cystic kidney disease, unspecified: Secondary | ICD-10-CM

## 2013-09-21 DIAGNOSIS — M949 Disorder of cartilage, unspecified: Secondary | ICD-10-CM

## 2013-09-21 NOTE — Progress Notes (Signed)
   Subjective:    Patient ID: Karla Roman, female    DOB: Apr 21, 1941, 73 y.o.   MRN: [id number]  Gastrophageal Reflux She reports no abdominal pain, no belching, no chest pain, no choking, no coughing, no dysphagia, no early satiety, no globus sensation, no heartburn, no hoarse voice, no nausea, no sore throat, no stridor, no tooth decay, no water brash or no wheezing. This is a chronic problem. The current episode started more than 1 year ago. The problem has been unchanged. Nothing aggravates the symptoms. Associated symptoms include fatigue. Pertinent negatives include no anemia, melena, muscle weakness, orthopnea or weight loss. She has tried a PPI for the symptoms. The treatment provided moderate relief. Past procedures include an EGD.      Review of Systems  Constitutional: Positive for fatigue. Negative for fever, chills, weight loss, diaphoresis, activity change, appetite change and unexpected weight change.  HENT: Negative.  Negative for hoarse voice and sore throat.   Eyes: Negative.   Respiratory: Negative.  Negative for cough, choking, chest tightness, shortness of breath, wheezing and stridor.   Cardiovascular: Negative.  Negative for chest pain, palpitations and leg swelling.  Gastrointestinal: Negative.  Negative for heartburn, dysphagia, nausea, vomiting, abdominal pain, diarrhea, constipation, blood in stool and melena.  Endocrine: Negative.   Genitourinary: Negative.   Musculoskeletal: Negative.  Negative for muscle weakness.  Skin: Negative.   Allergic/Immunologic: Negative.   Neurological: Negative.  Negative for dizziness, tremors, weakness, light-headedness, numbness and headaches.  Hematological: Negative.  Negative for adenopathy. Does not bruise/bleed easily.  Psychiatric/Behavioral: Negative.        Objective:   Physical Exam  Vitals reviewed. Constitutional: She is oriented to person, place, and time. She appears well-developed and well-nourished. No  distress.  HENT:  Head: Normocephalic and atraumatic.  Mouth/Throat: Oropharynx is clear and moist.  Eyes: Conjunctivae are normal. Right eye exhibits no discharge. Left eye exhibits no discharge. No scleral icterus.  Neck: Normal range of motion. Neck supple. No JVD present. No tracheal deviation present. No thyromegaly present.  Cardiovascular: Normal rate, regular rhythm, normal heart sounds and intact distal pulses.  Exam reveals no gallop and no friction rub.   No murmur heard. Pulmonary/Chest: Effort normal and breath sounds normal. No stridor. No respiratory distress. She has no wheezes. She has no rales. She exhibits no tenderness.  Abdominal: Soft. Bowel sounds are normal. She exhibits no distension and no mass. There is no tenderness. There is no rebound and no guarding.  Musculoskeletal: Normal range of motion. She exhibits no edema and no tenderness.  Lymphadenopathy:    She has no cervical adenopathy.  Neurological: She is oriented to person, place, and time.  Skin: Skin is warm and dry. No rash noted. She is not diaphoretic. No erythema. No pallor.     Lab Results  Component Value Date   WBC 5.5 09/02/2013   HGB 12.6 09/02/2013   HCT 37.8 09/02/2013   PLT 236.0 09/02/2013   GLUCOSE 85 06/04/2013   CHOL 221* 12/23/2012   TRIG 56.0 12/23/2012   HDL 88.90 12/23/2012   LDLDIRECT 115.1 12/23/2012   ALT 17 06/03/2013   AST 37 06/03/2013   NA 140 06/04/2013   K 4.4 06/04/2013   CL 102 06/04/2013   CREATININE 0.57 06/04/2013   BUN 8 06/04/2013   CO2 25 06/04/2013   TSH 0.88 09/02/2013       Assessment & Plan:

## 2013-09-21 NOTE — Progress Notes (Signed)
Pre visit review using our clinic review tool, if applicable. No additional management support is needed unless otherwise documented below in the visit note. 

## 2013-09-21 NOTE — Patient Instructions (Signed)
Gastroesophageal Reflux Disease, Adult  Gastroesophageal reflux disease (GERD) happens when acid from your stomach flows up into the esophagus. When acid comes in contact with the esophagus, the acid causes soreness (inflammation) in the esophagus. Over time, GERD may create small holes (ulcers) in the lining of the esophagus.  CAUSES   · Increased body weight. This puts pressure on the stomach, making acid rise from the stomach into the esophagus.  · Smoking. This increases acid production in the stomach.  · Drinking alcohol. This causes decreased pressure in the lower esophageal sphincter (valve or ring of muscle between the esophagus and stomach), allowing acid from the stomach into the esophagus.  · Late evening meals and a full stomach. This increases pressure and acid production in the stomach.  · A malformed lower esophageal sphincter.  Sometimes, no cause is found.  SYMPTOMS   · Burning pain in the lower part of the mid-chest behind the breastbone and in the mid-stomach area. This may occur twice a week or more often.  · Trouble swallowing.  · Sore throat.  · Dry cough.  · Asthma-like symptoms including chest tightness, shortness of breath, or wheezing.  DIAGNOSIS   Your caregiver may be able to diagnose GERD based on your symptoms. In some cases, X-rays and other tests may be done to check for complications or to check the condition of your stomach and esophagus.  TREATMENT   Your caregiver may recommend over-the-counter or prescription medicines to help decrease acid production. Ask your caregiver before starting or adding any new medicines.   HOME CARE INSTRUCTIONS   · Change the factors that you can control. Ask your caregiver for guidance concerning weight loss, quitting smoking, and alcohol consumption.  · Avoid foods and drinks that make your symptoms worse, such as:  · Caffeine or alcoholic drinks.  · Chocolate.  · Peppermint or mint flavorings.  · Garlic and onions.  · Spicy foods.  · Citrus fruits,  such as oranges, lemons, or limes.  · Tomato-based foods such as sauce, chili, salsa, and pizza.  · Fried and fatty foods.  · Avoid lying down for the 3 hours prior to your bedtime or prior to taking a nap.  · Eat small, frequent meals instead of large meals.  · Wear loose-fitting clothing. Do not wear anything tight around your waist that causes pressure on your stomach.  · Raise the head of your bed 6 to 8 inches with wood blocks to help you sleep. Extra pillows will not help.  · Only take over-the-counter or prescription medicines for pain, discomfort, or fever as directed by your caregiver.  · Do not take aspirin, ibuprofen, or other nonsteroidal anti-inflammatory drugs (NSAIDs).  SEEK IMMEDIATE MEDICAL CARE IF:   · You have pain in your arms, neck, jaw, teeth, or back.  · Your pain increases or changes in intensity or duration.  · You develop nausea, vomiting, or sweating (diaphoresis).  · You develop shortness of breath, or you faint.  · Your vomit is green, yellow, black, or looks like coffee grounds or blood.  · Your stool is red, bloody, or black.  These symptoms could be signs of other problems, such as heart disease, gastric bleeding, or esophageal bleeding.  MAKE SURE YOU:   · Understand these instructions.  · Will watch your condition.  · Will get help right away if you are not doing well or get worse.  Document Released: 02/06/2005 Document Revised: 07/22/2011 Document Reviewed: 11/16/2010  [employer]® Patient   Information ©2014 [employer].

## 2013-09-22 NOTE — Assessment & Plan Note (Signed)
She is doing well on her current regimen

## 2013-09-22 NOTE — Assessment & Plan Note (Signed)
This is stable and appears benign

## 2013-12-27 ENCOUNTER — Encounter: Payer: Self-pay | Admitting: Internal Medicine

## 2013-12-27 ENCOUNTER — Other Ambulatory Visit (INDEPENDENT_AMBULATORY_CARE_PROVIDER_SITE_OTHER): Payer: Medicare Other

## 2013-12-27 ENCOUNTER — Ambulatory Visit (INDEPENDENT_AMBULATORY_CARE_PROVIDER_SITE_OTHER): Payer: Medicare Other | Admitting: Internal Medicine

## 2013-12-27 VITALS — BP 130/68 | HR 73 | Temp 97.9°F | Resp 16 | Ht 62.0 in | Wt 108.8 lb

## 2013-12-27 DIAGNOSIS — Q619 Cystic kidney disease, unspecified: Secondary | ICD-10-CM

## 2013-12-27 DIAGNOSIS — Z0000 Encounter for general adult medical examination without abnormal findings: Secondary | ICD-10-CM

## 2013-12-27 DIAGNOSIS — E785 Hyperlipidemia, unspecified: Secondary | ICD-10-CM

## 2013-12-27 DIAGNOSIS — I059 Rheumatic mitral valve disease, unspecified: Secondary | ICD-10-CM

## 2013-12-27 DIAGNOSIS — N281 Cyst of kidney, acquired: Secondary | ICD-10-CM

## 2013-12-27 LAB — COMPREHENSIVE METABOLIC PANEL
ALT: 18 U/L (ref 0–35)
AST: 22 U/L (ref 0–37)
Albumin: 4.1 g/dL (ref 3.5–5.2)
Alkaline Phosphatase: 83 U/L (ref 39–117)
BUN: 17 mg/dL (ref 6–23)
CO2: 29 mEq/L (ref 19–32)
Calcium: 9.5 mg/dL (ref 8.4–10.5)
Chloride: 103 mEq/L (ref 96–112)
Creatinine, Ser: 0.8 mg/dL (ref 0.4–1.2)
GFR: 70.33 mL/min (ref >60.00)
Glucose, Bld: 92 mg/dL (ref 70–99)
Potassium: 4.6 mEq/L (ref 3.5–5.1)
Sodium: 140 mEq/L (ref 135–145)
Total Bilirubin: 1 mg/dL (ref 0.2–1.2)
Total Protein: 7.1 g/dL (ref 6.0–8.3)

## 2013-12-27 LAB — LIPID PANEL
Cholesterol: 232 mg/dL — ABNORMAL HIGH (ref 0–200)
HDL: 99.7 mg/dL (ref >39.00)
LDL Cholesterol: 121 mg/dL — ABNORMAL HIGH (ref 0–99)
NonHDL: 132.3
Total CHOL/HDL Ratio: 2
Triglycerides: 55 mg/dL (ref 0.0–149.0)
VLDL: 11 mg/dL (ref 0.0–40.0)

## 2013-12-27 LAB — TSH: TSH: 1.29 u[IU]/mL (ref 0.35–4.50)

## 2013-12-27 NOTE — Patient Instructions (Signed)
Preventive Care for Adults A healthy lifestyle and preventive care can promote health and wellness. Preventive health guidelines for women include the following key practices.  A routine yearly physical is a good way to check with your health care provider about your health and preventive screening. It is a chance to share any concerns and updates on your health and to receive a thorough exam.  Visit your dentist for a routine exam and preventive care every 6 months. Brush your teeth twice a day and floss once a day. Good oral hygiene prevents tooth decay and gum disease.  The frequency of eye exams is based on your age, health, family medical history, use of contact lenses, and other factors. Follow your health care provider's recommendations for frequency of eye exams.  Eat a healthy diet. Foods like vegetables, fruits, whole grains, low-fat dairy products, and lean protein foods contain the nutrients you need without too many calories. Decrease your intake of foods high in solid fats, added sugars, and salt. Eat the right amount of calories for you.Get information about a proper diet from your health care provider, if necessary.  Regular physical exercise is one of the most important things you can do for your health. Most adults should get at least 150 minutes of moderate-intensity exercise (any activity that increases your heart rate and causes you to sweat) each week. In addition, most adults need muscle-strengthening exercises on 2 or more days a week.  Maintain a healthy weight. The body mass index (BMI) is a screening tool to identify possible weight problems. It provides an estimate of body fat based on height and weight. Your health care provider can find your BMI and can help you achieve or maintain a healthy weight.For adults 20 years and older:  A BMI below 18.5 is considered underweight.  A BMI of 18.5 to 24.9 is normal.  A BMI of 25 to 29.9 is considered overweight.  A BMI of  30 and above is considered obese.  Maintain normal blood lipids and cholesterol levels by exercising and minimizing your intake of saturated fat. Eat a balanced diet with plenty of fruit and vegetables. Blood tests for lipids and cholesterol should begin at age 76 and be repeated every 5 years. If your lipid or cholesterol levels are high, you are over 50, or you are at high risk for heart disease, you may need your cholesterol levels checked more frequently.Ongoing high lipid and cholesterol levels should be treated with medicines if diet and exercise are not working.  If you smoke, find out from your health care provider how to quit. If you do not use tobacco, do not start.  Lung cancer screening is recommended for adults aged 22-80 years who are at high risk for developing lung cancer because of a history of smoking. A yearly low-dose CT scan of the lungs is recommended for people who have at least a 30-pack-year history of smoking and are a current smoker or have quit within the past 15 years. A pack year of smoking is smoking an average of 1 pack of cigarettes a day for 1 year (for example: 1 pack a day for 30 years or 2 packs a day for 15 years). Yearly screening should continue until the smoker has stopped smoking for at least 15 years. Yearly screening should be stopped for people who develop a health problem that would prevent them from having lung cancer treatment.  If you are pregnant, do not drink alcohol. If you are breastfeeding,  be very cautious about drinking alcohol. If you are not pregnant and choose to drink alcohol, do not have more than 1 drink per day. One drink is considered to be 12 ounces (355 mL) of beer, 5 ounces (148 mL) of wine, or 1.5 ounces (44 mL) of liquor.  Avoid use of street drugs. Do not share needles with anyone. Ask for help if you need support or instructions about stopping the use of drugs.  High blood pressure causes heart disease and increases the risk of  stroke. Your blood pressure should be checked at least every 1 to 2 years. Ongoing high blood pressure should be treated with medicines if weight loss and exercise do not work.  If you are 75-52 years old, ask your health care provider if you should take aspirin to prevent strokes.  Diabetes screening involves taking a blood sample to check your fasting blood sugar level. This should be done once every 3 years, after age 15, if you are within normal weight and without risk factors for diabetes. Testing should be considered at a younger age or be carried out more frequently if you are overweight and have at least 1 risk factor for diabetes.  Breast cancer screening is essential preventive care for women. You should practice "breast self-awareness." This means understanding the normal appearance and feel of your breasts and may include breast self-examination. Any changes detected, no matter how small, should be reported to a health care provider. Women in their 58s and 30s should have a clinical breast exam (CBE) by a health care provider as part of a regular health exam every 1 to 3 years. After age 16, women should have a CBE every year. Starting at age 53, women should consider having a mammogram (breast X-ray test) every year. Women who have a family history of breast cancer should talk to their health care provider about genetic screening. Women at a high risk of breast cancer should talk to their health care providers about having an MRI and a mammogram every year.  Breast cancer gene (BRCA)-related cancer risk assessment is recommended for women who have family members with BRCA-related cancers. BRCA-related cancers include breast, ovarian, tubal, and peritoneal cancers. Having family members with these cancers may be associated with an increased risk for harmful changes (mutations) in the breast cancer genes BRCA1 and BRCA2. Results of the assessment will determine the need for genetic counseling and  BRCA1 and BRCA2 testing.  Routine pelvic exams to screen for cancer are no longer recommended for nonpregnant women who are considered low risk for cancer of the pelvic organs (ovaries, uterus, and vagina) and who do not have symptoms. Ask your health care provider if a screening pelvic exam is right for you.  If you have had past treatment for cervical cancer or a condition that could lead to cancer, you need Pap tests and screening for cancer for at least 20 years after your treatment. If Pap tests have been discontinued, your risk factors (such as having a new sexual partner) need to be reassessed to determine if screening should be resumed. Some women have medical problems that increase the chance of getting cervical cancer. In these cases, your health care provider may recommend more frequent screening and Pap tests.  The HPV test is an additional test that may be used for cervical cancer screening. The HPV test looks for the virus that can cause the cell changes on the cervix. The cells collected during the Pap test can be  tested for HPV. The HPV test could be used to screen women aged 30 years and older, and should be used in women of any age who have unclear Pap test results. After the age of 30, women should have HPV testing at the same frequency as a Pap test.  Colorectal cancer can be detected and often prevented. Most routine colorectal cancer screening begins at the age of 50 years and continues through age 75 years. However, your health care provider may recommend screening at an earlier age if you have risk factors for colon cancer. On a yearly basis, your health care provider may provide home test kits to check for hidden blood in the stool. Use of a small camera at the end of a tube, to directly examine the colon (sigmoidoscopy or colonoscopy), can detect the earliest forms of colorectal cancer. Talk to your health care provider about this at age 50, when routine screening begins. Direct  exam of the colon should be repeated every 5-10 years through age 75 years, unless early forms of pre-cancerous polyps or small growths are found.  People who are at an increased risk for hepatitis B should be screened for this virus. You are considered at high risk for hepatitis B if:  You were born in a country where hepatitis B occurs often. Talk with your health care provider about which countries are considered high risk.  Your parents were born in a high-risk country and you have not received a shot to protect against hepatitis B (hepatitis B vaccine).  You have HIV or AIDS.  You use needles to inject street drugs.  You live with, or have sex with, someone who has hepatitis B.  You get hemodialysis treatment.  You take certain medicines for conditions like cancer, organ transplantation, and autoimmune conditions.  Hepatitis C blood testing is recommended for all people born from 1945 through 1965 and any individual with known risks for hepatitis C.  Practice safe sex. Use condoms and avoid high-risk sexual practices to reduce the spread of sexually transmitted infections (STIs). STIs include gonorrhea, chlamydia, syphilis, trichomonas, herpes, HPV, and human immunodeficiency virus (HIV). Herpes, HIV, and HPV are viral illnesses that have no cure. They can result in disability, cancer, and death.  You should be screened for sexually transmitted illnesses (STIs) including gonorrhea and chlamydia if:  You are sexually active and are younger than 24 years.  You are older than 24 years and your health care provider tells you that you are at risk for this type of infection.  Your sexual activity has changed since you were last screened and you are at an increased risk for chlamydia or gonorrhea. Ask your health care provider if you are at risk.  If you are at risk of being infected with HIV, it is recommended that you take a prescription medicine daily to prevent HIV infection. This is  called preexposure prophylaxis (PrEP). You are considered at risk if:  You are a heterosexual woman, are sexually active, and are at increased risk for HIV infection.  You take drugs by injection.  You are sexually active with a partner who has HIV.  Talk with your health care provider about whether you are at high risk of being infected with HIV. If you choose to begin PrEP, you should first be tested for HIV. You should then be tested every 3 months for as long as you are taking PrEP.  Osteoporosis is a disease in which the bones lose minerals and strength   with aging. This can result in serious bone fractures or breaks. The risk of osteoporosis can be identified using a bone density scan. Women ages 65 years and over and women at risk for fractures or osteoporosis should discuss screening with their health care providers. Ask your health care provider whether you should take a calcium supplement or vitamin D to reduce the rate of osteoporosis.  Menopause can be associated with physical symptoms and risks. Hormone replacement therapy is available to decrease symptoms and risks. You should talk to your health care provider about whether hormone replacement therapy is right for you.  Use sunscreen. Apply sunscreen liberally and repeatedly throughout the day. You should seek shade when your shadow is shorter than you. Protect yourself by wearing long sleeves, pants, a wide-brimmed hat, and sunglasses year round, whenever you are outdoors.  Once a month, do a whole body skin exam, using a mirror to look at the skin on your back. Tell your health care provider of new moles, moles that have irregular borders, moles that are larger than a pencil eraser, or moles that have changed in shape or color.  Stay current with required vaccines (immunizations).  Influenza vaccine. All adults should be immunized every year.  Tetanus, diphtheria, and acellular pertussis (Td, Tdap) vaccine. Pregnant women should  receive 1 dose of Tdap vaccine during each pregnancy. The dose should be obtained regardless of the length of time since the last dose. Immunization is preferred during the 27th-36th week of gestation. An adult who has not previously received Tdap or who does not know her vaccine status should receive 1 dose of Tdap. This initial dose should be followed by tetanus and diphtheria toxoids (Td) booster doses every 10 years. Adults with an unknown or incomplete history of completing a 3-dose immunization series with Td-containing vaccines should begin or complete a primary immunization series including a Tdap dose. Adults should receive a Td booster every 10 years.  Varicella vaccine. An adult without evidence of immunity to varicella should receive 2 doses or a second dose if she has previously received 1 dose. Pregnant females who do not have evidence of immunity should receive the first dose after pregnancy. This first dose should be obtained before leaving the health care facility. The second dose should be obtained 4-8 weeks after the first dose.  Human papillomavirus (HPV) vaccine. Females aged 13-26 years who have not received the vaccine previously should obtain the 3-dose series. The vaccine is not recommended for use in pregnant females. However, pregnancy testing is not needed before receiving a dose. If a female is found to be pregnant after receiving a dose, no treatment is needed. In that case, the remaining doses should be delayed until after the pregnancy. Immunization is recommended for any person with an immunocompromised condition through the age of 26 years if she did not get any or all doses earlier. During the 3-dose series, the second dose should be obtained 4-8 weeks after the first dose. The third dose should be obtained 24 weeks after the first dose and 16 weeks after the second dose.  Zoster vaccine. One dose is recommended for adults aged 60 years or older unless certain conditions are  present.  Measles, mumps, and rubella (MMR) vaccine. Adults born before 1957 generally are considered immune to measles and mumps. Adults born in 1957 or later should have 1 or more doses of MMR vaccine unless there is a contraindication to the vaccine or there is laboratory evidence of immunity to   each of the three diseases. A routine second dose of MMR vaccine should be obtained at least 28 days after the first dose for students attending postsecondary schools, health care workers, or international travelers. People who received inactivated measles vaccine or an unknown type of measles vaccine during 1963-1967 should receive 2 doses of MMR vaccine. People who received inactivated mumps vaccine or an unknown type of mumps vaccine before 1979 and are at high risk for mumps infection should consider immunization with 2 doses of MMR vaccine. For females of childbearing age, rubella immunity should be determined. If there is no evidence of immunity, females who are not pregnant should be vaccinated. If there is no evidence of immunity, females who are pregnant should delay immunization until after pregnancy. Unvaccinated health care workers born before 1957 who lack laboratory evidence of measles, mumps, or rubella immunity or laboratory confirmation of disease should consider measles and mumps immunization with 2 doses of MMR vaccine or rubella immunization with 1 dose of MMR vaccine.  Pneumococcal 13-valent conjugate (PCV13) vaccine. When indicated, a person who is uncertain of her immunization history and has no record of immunization should receive the PCV13 vaccine. An adult aged 19 years or older who has certain medical conditions and has not been previously immunized should receive 1 dose of PCV13 vaccine. This PCV13 should be followed with a dose of pneumococcal polysaccharide (PPSV23) vaccine. The PPSV23 vaccine dose should be obtained at least 8 weeks after the dose of PCV13 vaccine. An adult aged 19  years or older who has certain medical conditions and previously received 1 or more doses of PPSV23 vaccine should receive 1 dose of PCV13. The PCV13 vaccine dose should be obtained 1 or more years after the last PPSV23 vaccine dose.  Pneumococcal polysaccharide (PPSV23) vaccine. When PCV13 is also indicated, PCV13 should be obtained first. All adults aged 65 years and older should be immunized. An adult younger than age 65 years who has certain medical conditions should be immunized. Any person who resides in a nursing home or long-term care facility should be immunized. An adult smoker should be immunized. People with an immunocompromised condition and certain other conditions should receive both PCV13 and PPSV23 vaccines. People with human immunodeficiency virus (HIV) infection should be immunized as soon as possible after diagnosis. Immunization during chemotherapy or radiation therapy should be avoided. Routine use of PPSV23 vaccine is not recommended for American Indians, [place] Natives, or people younger than 65 years unless there are medical conditions that require PPSV23 vaccine. When indicated, people who have unknown immunization and have no record of immunization should receive PPSV23 vaccine. One-time revaccination 5 years after the first dose of PPSV23 is recommended for people aged 19-64 years who have chronic kidney failure, nephrotic syndrome, asplenia, or immunocompromised conditions. People who received 1-2 doses of PPSV23 before age 65 years should receive another dose of PPSV23 vaccine at age 65 years or later if at least 5 years have passed since the previous dose. Doses of PPSV23 are not needed for people immunized with PPSV23 at or after age 65 years.  Meningococcal vaccine. Adults with asplenia or persistent complement component deficiencies should receive 2 doses of quadrivalent meningococcal conjugate (MenACWY-D) vaccine. The doses should be obtained at least 2 months apart.  Microbiologists working with certain meningococcal bacteria, military recruits, people at risk during an outbreak, and people who travel to or live in countries with a high rate of meningitis should be immunized. A first-year college student up through age   21 years who is living in a residence hall should receive a dose if she did not receive a dose on or after her 16th birthday. Adults who have certain high-risk conditions should receive one or more doses of vaccine.  Hepatitis A vaccine. Adults who wish to be protected from this disease, have certain high-risk conditions, work with hepatitis A-infected animals, work in hepatitis A research labs, or travel to or work in countries with a high rate of hepatitis A should be immunized. Adults who were previously unvaccinated and who anticipate close contact with an international adoptee during the first 60 days after arrival in the [place] from a country with a high rate of hepatitis A should be immunized.  Hepatitis B vaccine. Adults who wish to be protected from this disease, have certain high-risk conditions, may be exposed to blood or other infectious body fluids, are household contacts or sex partners of hepatitis B positive people, are clients or workers in certain care facilities, or travel to or work in countries with a high rate of hepatitis B should be immunized.  Haemophilus influenzae type b (Hib) vaccine. A previously unvaccinated person with asplenia or sickle cell disease or having a scheduled splenectomy should receive 1 dose of Hib vaccine. Regardless of previous immunization, a recipient of a hematopoietic stem cell transplant should receive a 3-dose series 6-12 months after her successful transplant. Hib vaccine is not recommended for adults with HIV infection. Preventive Services / Frequency Ages 64 to 68 years  Blood pressure check.** / Every 1 to 2 years.  Lipid and cholesterol check.** / Every 5 years beginning at age  22.  Clinical breast exam.** / Every 3 years for women in their 88s and 53s.  BRCA-related cancer risk assessment.** / For women who have family members with a BRCA-related cancer (breast, ovarian, tubal, or peritoneal cancers).  Pap test.** / Every 2 years from ages 90 through 51. Every 3 years starting at age 21 through age 56 or 3 with a history of 3 consecutive normal Pap tests.  HPV screening.** / Every 3 years from ages 24 through ages 1 to 46 with a history of 3 consecutive normal Pap tests.  Hepatitis C blood test.** / For any individual with known risks for hepatitis C.  Skin self-exam. / Monthly.  Influenza vaccine. / Every year.  Tetanus, diphtheria, and acellular pertussis (Tdap, Td) vaccine.** / Consult your health care provider. Pregnant women should receive 1 dose of Tdap vaccine during each pregnancy. 1 dose of Td every 10 years.  Varicella vaccine.** / Consult your health care provider. Pregnant females who do not have evidence of immunity should receive the first dose after pregnancy.  HPV vaccine. / 3 doses over 6 months, if 72 and younger. The vaccine is not recommended for use in pregnant females. However, pregnancy testing is not needed before receiving a dose.  Measles, mumps, rubella (MMR) vaccine.** / You need at least 1 dose of MMR if you were born in 1957 or later. You may also need a 2nd dose. For females of childbearing age, rubella immunity should be determined. If there is no evidence of immunity, females who are not pregnant should be vaccinated. If there is no evidence of immunity, females who are pregnant should delay immunization until after pregnancy.  Pneumococcal 13-valent conjugate (PCV13) vaccine.** / Consult your health care provider.  Pneumococcal polysaccharide (PPSV23) vaccine.** / 1 to 2 doses if you smoke cigarettes or if you have certain conditions.  Meningococcal vaccine.** /  1 dose if you are age 19 to 21 years and a first-year college  student living in a residence hall, or have one of several medical conditions, you need to get vaccinated against meningococcal disease. You may also need additional booster doses.  Hepatitis A vaccine.** / Consult your health care provider.  Hepatitis B vaccine.** / Consult your health care provider.  Haemophilus influenzae type b (Hib) vaccine.** / Consult your health care provider. Ages 40 to 64 years  Blood pressure check.** / Every 1 to 2 years.  Lipid and cholesterol check.** / Every 5 years beginning at age 20 years.  Lung cancer screening. / Every year if you are aged 55-80 years and have a 30-pack-year history of smoking and currently smoke or have quit within the past 15 years. Yearly screening is stopped once you have quit smoking for at least 15 years or develop a health problem that would prevent you from having lung cancer treatment.  Clinical breast exam.** / Every year after age 40 years.  BRCA-related cancer risk assessment.** / For women who have family members with a BRCA-related cancer (breast, ovarian, tubal, or peritoneal cancers).  Mammogram.** / Every year beginning at age 40 years and continuing for as long as you are in good health. Consult with your health care provider.  Pap test.** / Every 3 years starting at age 30 years through age 65 or 70 years with a history of 3 consecutive normal Pap tests.  HPV screening.** / Every 3 years from ages 30 years through ages 65 to 70 years with a history of 3 consecutive normal Pap tests.  Fecal occult blood test (FOBT) of stool. / Every year beginning at age 50 years and continuing until age 75 years. You may not need to do this test if you get a colonoscopy every 10 years.  Flexible sigmoidoscopy or colonoscopy.** / Every 5 years for a flexible sigmoidoscopy or every 10 years for a colonoscopy beginning at age 50 years and continuing until age 75 years.  Hepatitis C blood test.** / For all people born from 1945 through  1965 and any individual with known risks for hepatitis C.  Skin self-exam. / Monthly.  Influenza vaccine. / Every year.  Tetanus, diphtheria, and acellular pertussis (Tdap/Td) vaccine.** / Consult your health care provider. Pregnant women should receive 1 dose of Tdap vaccine during each pregnancy. 1 dose of Td every 10 years.  Varicella vaccine.** / Consult your health care provider. Pregnant females who do not have evidence of immunity should receive the first dose after pregnancy.  Zoster vaccine.** / 1 dose for adults aged 60 years or older.  Measles, mumps, rubella (MMR) vaccine.** / You need at least 1 dose of MMR if you were born in 1957 or later. You may also need a 2nd dose. For females of childbearing age, rubella immunity should be determined. If there is no evidence of immunity, females who are not pregnant should be vaccinated. If there is no evidence of immunity, females who are pregnant should delay immunization until after pregnancy.  Pneumococcal 13-valent conjugate (PCV13) vaccine.** / Consult your health care provider.  Pneumococcal polysaccharide (PPSV23) vaccine.** / 1 to 2 doses if you smoke cigarettes or if you have certain conditions.  Meningococcal vaccine.** / Consult your health care provider.  Hepatitis A vaccine.** / Consult your health care provider.  Hepatitis B vaccine.** / Consult your health care provider.  Haemophilus influenzae type b (Hib) vaccine.** / Consult your health care provider. Ages 65   years and over  Blood pressure check.** / Every 1 to 2 years.  Lipid and cholesterol check.** / Every 5 years beginning at age 22 years.  Lung cancer screening. / Every year if you are aged 73-80 years and have a 30-pack-year history of smoking and currently smoke or have quit within the past 15 years. Yearly screening is stopped once you have quit smoking for at least 15 years or develop a health problem that would prevent you from having lung cancer  treatment.  Clinical breast exam.** / Every year after age 4 years.  BRCA-related cancer risk assessment.** / For women who have family members with a BRCA-related cancer (breast, ovarian, tubal, or peritoneal cancers).  Mammogram.** / Every year beginning at age 40 years and continuing for as long as you are in good health. Consult with your health care provider.  Pap test.** / Every 3 years starting at age 9 years through age 34 or 91 years with 3 consecutive normal Pap tests. Testing can be stopped between 65 and 70 years with 3 consecutive normal Pap tests and no abnormal Pap or HPV tests in the past 10 years.  HPV screening.** / Every 3 years from ages 57 years through ages 64 or 45 years with a history of 3 consecutive normal Pap tests. Testing can be stopped between 65 and 70 years with 3 consecutive normal Pap tests and no abnormal Pap or HPV tests in the past 10 years.  Fecal occult blood test (FOBT) of stool. / Every year beginning at age 15 years and continuing until age 17 years. You may not need to do this test if you get a colonoscopy every 10 years.  Flexible sigmoidoscopy or colonoscopy.** / Every 5 years for a flexible sigmoidoscopy or every 10 years for a colonoscopy beginning at age 86 years and continuing until age 71 years.  Hepatitis C blood test.** / For all people born from [birth year] through 1966 and any individual with known risks for hepatitis C.  Osteoporosis screening.** / A one-time screening for women ages 83 years and over and women at risk for fractures or osteoporosis.  Skin self-exam. / Monthly.  Influenza vaccine. / Every year.  Tetanus, diphtheria, and acellular pertussis (Tdap/Td) vaccine.** / 1 dose of Td every 10 years.  Varicella vaccine.** / Consult your health care provider.  Zoster vaccine.** / 1 dose for adults aged 61 years or older.  Pneumococcal 13-valent conjugate (PCV13) vaccine.** / Consult your health care provider.  Pneumococcal  polysaccharide (PPSV23) vaccine.** / 1 dose for all adults aged 28 years and older.  Meningococcal vaccine.** / Consult your health care provider.  Hepatitis A vaccine.** / Consult your health care provider.  Hepatitis B vaccine.** / Consult your health care provider.  Haemophilus influenzae type b (Hib) vaccine.** / Consult your health care provider. ** Family history and personal history of risk and conditions may change your health care provider's recommendations. Document Released: 06/25/2001 Document Revised: 09/13/2013 Document Reviewed: 09/24/2010 [facility] Patient Information 2015 [place], [place]. This information is not intended to replace advice given to you by your health care provider. Make sure you discuss any questions you have with your health care provider.

## 2013-12-27 NOTE — Progress Notes (Signed)
Pre visit review using our clinic review tool, if applicable. No additional management support is needed unless otherwise documented below in the visit note. 

## 2013-12-27 NOTE — Progress Notes (Signed)
Subjective:    Patient ID: Karla Roman, female    DOB: 07-16-1940, 73 y.o.   MRN: [id number]  Hyperlipidemia This is a chronic problem. The current episode started more than 1 year ago. The problem is controlled. Recent lipid tests were reviewed and are variable. She has no history of chronic renal disease, diabetes, hypothyroidism, liver disease, obesity or nephrotic syndrome. There are no known factors aggravating her hyperlipidemia. Pertinent negatives include no chest pain, focal sensory loss, focal weakness, leg pain, myalgias or shortness of breath. Current antihyperlipidemic treatment includes diet change. The current treatment provides significant improvement of lipids. There are no compliance problems.       Review of Systems  Constitutional: Negative.   HENT: Negative.   Eyes: Negative.   Respiratory: Negative.  Negative for cough, choking, chest tightness, shortness of breath and stridor.   Cardiovascular: Negative.  Negative for chest pain, palpitations and leg swelling.  Gastrointestinal: Negative.  Negative for nausea, abdominal pain, diarrhea and constipation.  Endocrine: Negative.   Genitourinary: Negative.   Musculoskeletal: Negative.  Negative for arthralgias, back pain and myalgias.  Skin: Negative.  Negative for rash.  Allergic/Immunologic: Negative.   Neurological: Negative.  Negative for focal weakness.  Hematological: Negative.  Negative for adenopathy. Does not bruise/bleed easily.  Psychiatric/Behavioral: Negative.        Objective:   Physical Exam  Vitals reviewed. Constitutional: She is oriented to person, place, and time. She appears well-developed and well-nourished. No distress.  HENT:  Head: Normocephalic and atraumatic.  Mouth/Throat: Oropharynx is clear and moist. No oropharyngeal exudate.  Eyes: Conjunctivae are normal. Right eye exhibits no discharge. Left eye exhibits no discharge. No scleral icterus.  Neck: Normal range of motion. Neck  supple. No JVD present. No tracheal deviation present. No thyromegaly present.  Cardiovascular: Normal rate, regular rhythm, normal heart sounds and intact distal pulses.  Exam reveals no gallop and no friction rub.   No murmur heard. Pulmonary/Chest: Effort normal and breath sounds normal. No stridor. No respiratory distress. She has no decreased breath sounds. She has no wheezes. She has no rhonchi. She has no rales. She exhibits no mass, no tenderness, no edema, no deformity and no swelling. Right breast exhibits no inverted nipple, no mass, no nipple discharge, no skin change and no tenderness. Left breast exhibits no inverted nipple, no mass, no nipple discharge, no skin change and no tenderness. Breasts are symmetrical.  Abdominal: Soft. Bowel sounds are normal. She exhibits no distension and no mass. There is no tenderness. There is no rebound and no guarding. Hernia confirmed negative in the right inguinal area and confirmed negative in the left inguinal area.  Genitourinary: Rectum normal and vagina normal. Rectal exam shows no external hemorrhoid, no internal hemorrhoid, no fissure, no mass, no tenderness and anal tone normal. Guaiac negative stool. No breast swelling, tenderness, discharge or bleeding. Pelvic exam was performed with patient supine. No labial fusion. There is no rash, tenderness, lesion or injury on the right labia. There is no rash, tenderness, lesion or injury on the left labia. Right adnexum displays no mass, no tenderness and no fullness. Left adnexum displays no mass, no tenderness and no fullness. No erythema, tenderness or bleeding around the vagina. No foreign body around the vagina. No signs of injury around the vagina. No vaginal discharge found.  Musculoskeletal: Normal range of motion. She exhibits no edema and no tenderness.  Lymphadenopathy:    She has no cervical adenopathy.  Right: No inguinal adenopathy present.       Left: No inguinal adenopathy present.    Neurological: She is oriented to person, place, and time.  Skin: Skin is warm and dry. No rash noted. She is not diaphoretic. No erythema. No pallor.     Lab Results  Component Value Date   WBC 5.5 09/02/2013   HGB 12.6 09/02/2013   HCT 37.8 09/02/2013   PLT 236.0 09/02/2013   GLUCOSE 85 06/04/2013   CHOL 221* 12/23/2012   TRIG 56.0 12/23/2012   HDL 88.90 12/23/2012   LDLDIRECT 115.1 12/23/2012   ALT 17 06/03/2013   AST 37 06/03/2013   NA 140 06/04/2013   K 4.4 06/04/2013   CL 102 06/04/2013   CREATININE 0.57 06/04/2013   BUN 8 06/04/2013   CO2 25 06/04/2013   TSH 0.88 09/02/2013       Assessment & Plan:

## 2013-12-28 NOTE — Assessment & Plan Note (Signed)
Will recheck this is a few months with f/up U/S or MRI

## 2013-12-28 NOTE — Assessment & Plan Note (Signed)
She does not want to take a statin.

## 2013-12-28 NOTE — Assessment & Plan Note (Addendum)

## 2013-12-30 ENCOUNTER — Encounter: Payer: Self-pay | Admitting: Internal Medicine

## 2014-01-06 ENCOUNTER — Encounter: Payer: Self-pay | Admitting: Internal Medicine

## 2014-01-06 ENCOUNTER — Ambulatory Visit (INDEPENDENT_AMBULATORY_CARE_PROVIDER_SITE_OTHER): Payer: Medicare Other | Admitting: Internal Medicine

## 2014-01-06 VITALS — BP 114/60 | HR 79 | Temp 97.9°F | Wt 106.5 lb

## 2014-01-06 DIAGNOSIS — H9190 Unspecified hearing loss, unspecified ear: Secondary | ICD-10-CM

## 2014-01-06 DIAGNOSIS — J018 Other acute sinusitis: Secondary | ICD-10-CM

## 2014-01-06 DIAGNOSIS — J019 Acute sinusitis, unspecified: Secondary | ICD-10-CM | POA: Insufficient documentation

## 2014-01-06 DIAGNOSIS — E785 Hyperlipidemia, unspecified: Secondary | ICD-10-CM

## 2014-01-06 DIAGNOSIS — H9192 Unspecified hearing loss, left ear: Secondary | ICD-10-CM

## 2014-01-06 MED ORDER — AZITHROMYCIN 250 MG PO TABS: ORAL_TABLET | ORAL | Status: DC

## 2014-01-06 NOTE — Assessment & Plan Note (Signed)
Mild to mod, for antibx course,  to f/u any worsening symptoms or concerns 

## 2014-01-06 NOTE — Assessment & Plan Note (Signed)
stable overall by history and exam, recent data reviewed with pt, and pt to continue medical treatment as before,  to f/u any worsening symptoms or concerns Lab Results  Component Value Date   LDLCALC 121* 12/27/2013   Ok for lower chol diet, f/u with PCP

## 2014-01-06 NOTE — Assessment & Plan Note (Signed)
Due to wax impaction, improved with irrigatoin,  to f/u any worsening symptoms or concerns

## 2014-01-06 NOTE — Progress Notes (Signed)
Pre visit review using our clinic review tool, if applicable. No additional management support is needed unless otherwise documented below in the visit note. 

## 2014-01-06 NOTE — Progress Notes (Signed)
Subjective:    Patient ID: Karla Roman, female    DOB: Feb 13, 1941, 73 y.o.   MRN: [id number]  HPI   Here with 2-3 days acute onset fever, facial pain, pressure, headache, general weakness and malaise, and greenish d/c, with mild ST and mostly non cough sometimes assoc with pleuritic right CP under right breast, but pt denies other chest pain, wheezing, increased sob or doe, orthopnea, PND, increased LE swelling, palpitations, dizziness or syncope. Asks about her chol from last visit, and also with decreased hearing on left - ? Wax recurrent. Past Medical History  Diagnosis Date  . Diverticulitis   . GERD (gastroesophageal reflux disease)   . Atrophic vaginitis   . MVP (mitral valve prolapse)   . Vertigo   . Osteopenia   . Eustachian tube dysfunction   . Anemia     in childhood  . Hiatal hernia   . Sleep apnea 10/2004    Sleep study by Dr Gwenette Greet.   Marland Kitchen Dyspareunia   . Diverticulosis   . Hx of adenomatous colonic polyps 02/2009    Tubular adenoma in sigmoid. Diminutive.   . Schatzki's ring 1995  . Allergic rhinitis   . Dysplastic nevus 09/2012, 10/2012    right thigh, severe atypia, resected 10/2012 by Dr Jarome Matin  . Duodenal ulcer   . Esophagitis     grade 1   Past Surgical History  Procedure Laterality Date  . Appendectomy  1989  . Abdominal hysterectomy      with anterior and posterior vaginal repair  . Tonsillectomy    . Breast biopsy      RIGHT  . Septoplasty    . Cervical discectomy  01/2004    C3-4, C5-6, C6-7  . Laparoscopy  04/2000    with lysis of adhesions  . Shoulder surgery Left 01/2004    release of adhesive capsulitis  . Depuytren's contraction  04/2006    right  . Dysplastic nevus  10/2012    right thigh  . Right shoulder fracture Right 03/2013    non-displaced, humeral head, non-operative mgt but she developed frozen shoulder.   . Esophagogastroduodenoscopy N/A 06/04/2013    Procedure: ESOPHAGOGASTRODUODENOSCOPY (EGD);  Surgeon: Lafayette Dragon, MD;   Location[facility] ENDOSCOPY;  Service: Endoscopy;  Laterality: N/A;    reports that she has never smoked. She has never used smokeless tobacco. She reports that she does not drink alcohol or use illicit drugs. family history includes Breast cancer in her maternal aunt; Cancer in her maternal aunt; Coronary artery disease in her brother, brother, father, and mother; Diabetes in her brother; Heart attack in her father; Hypertension in her sister; Hyperthyroidism in her sister; Irritable bowel syndrome in her sister. Allergies  Allergen Reactions  . Codeine Other (See Comments)    Anxiety, restlessness  . Levaquin [Levofloxacin]     Nausea, vomiting, diarrhea.   Current Outpatient Prescriptions on File Prior to Visit  Medication Sig Dispense Refill  . acetaminophen (TYLENOL) 500 MG tablet Take 500-1,000 mg by mouth every 6 (six) hours as needed for mild pain or headache.      . Calcium Carbonate (CALTRATE 600) 1500 MG TABS Take 1 tablet by mouth 2 (two) times daily. chewables      . cyanocobalamin 500 MCG tablet Take 500 mcg by mouth every morning.      . Multiple Vitamins-Minerals (MULTIVITAMIN WITH MINERALS) tablet Take 1 tablet by mouth every morning. chewables      . omega-3 acid ethyl  esters (LOVAZA) 1 G capsule Take 1 g by mouth every morning.      . pantoprazole (PROTONIX) 40 MG tablet Take 1 tab daily before breakfast.  90 tablet  3  . Psyllium (METAMUCIL PO) Take by mouth daily.      Marland Kitchen saccharomyces boulardii (FLORASTOR) 250 MG capsule Take 1 capsule (250 mg total) by mouth 2 (two) times daily.  60 capsule  0  . vitamin E 400 UNIT capsule Take 400 Units by mouth every morning.        No current facility-administered medications on file prior to visit.   Review of Systems  Constitutional: Negative for unusual diaphoresis or other sweats  HENT: Negative for ringing in ear Eyes: Negative for double vision or worsening visual disturbance.  Respiratory: Negative for choking and stridor.     Gastrointestinal: Negative for vomiting or other signifcant bowel change Genitourinary: Negative for hematuria or decreased urine volume.  Musculoskeletal: Negative for other MSK pain or swelling Skin: Negative for color change and worsening wound.  Neurological: Negative for tremors and numbness other than noted  Psychiatric/Behavioral: Negative for decreased concentration or agitation other than above       Objective:   Physical Exam BP 114/60  Pulse 79  Temp(Src) 97.9 F (36.6 C) (Oral)  Wt 106 lb 8 oz (48.308 kg)  SpO2 99% VS noted,  Constitutional: Pt appears well-developed, well-nourished.  HENT: Head: NCAT.  Right Ear: External ear normal.  Left Ear: External ear normal.  Left wax imipaction improved with irrigation Bilat tm's with mild erythema.  Max sinus areas mild tender.  Pharynx with mild erythema, no exudate Eyes: . Pupils are equal, round, and reactive to light. Conjunctivae and EOM are normal Neck: Normal range of motion. Neck supple.  Cardiovascular: Normal rate and regular rhythm.   Pulmonary/Chest: Effort normal and breath sounds normal.  - no rales or wheezing Neurological: Pt is alert. Not confused , motor grossly intact Skin: Skin is warm. No rash Psychiatric: Pt behavior is normal. No agitation.     Assessment & Plan:

## 2014-01-06 NOTE — Patient Instructions (Signed)
Please take all new medication as prescribed  Please continue all other medications as before, and refills have been done if requested.  Please have the pharmacy call with any other refills you may need.  Please continue your efforts at being more active, low cholesterol diet, and weight control.  Please keep your appointments with your specialists as you may have planned  Your left ear was irrigated of wax today

## 2014-01-10 ENCOUNTER — Encounter: Payer: Self-pay | Admitting: Internal Medicine

## 2014-01-20 ENCOUNTER — Telehealth: Payer: Self-pay | Admitting: Internal Medicine

## 2014-01-20 NOTE — Telephone Encounter (Signed)
I have reviewed my note from 06/2011 . Please schedule EGD on the day of her colonoscopy  For follow up of duodenal ulcer.

## 2014-01-20 NOTE — Telephone Encounter (Signed)
Spoke with patient and she is not having any problems but she thought Dr. Olevia Perches told her she would need a repeat EGD. If she does, she would like to schedule it with the colonoscopy. Patient is aware Dr. Olevia Perches is out of the office until next week. Please, advise.

## 2014-01-21 NOTE — Telephone Encounter (Signed)
Per Dr. Olevia Perches, [facility] to schedule EGD and Colonoscopy together.

## 2014-01-24 ENCOUNTER — Encounter: Payer: Self-pay | Admitting: Internal Medicine

## 2014-01-24 NOTE — Telephone Encounter (Signed)
I have scheduled her for Fri 03-25-14 at 11am. Karla Roman

## 2014-02-07 ENCOUNTER — Telehealth: Payer: Self-pay | Admitting: Internal Medicine

## 2014-02-07 NOTE — Telephone Encounter (Signed)
none

## 2014-02-11 ENCOUNTER — Ambulatory Visit (INDEPENDENT_AMBULATORY_CARE_PROVIDER_SITE_OTHER): Payer: Medicare Other

## 2014-02-11 DIAGNOSIS — Z23 Encounter for immunization: Secondary | ICD-10-CM

## 2014-03-10 ENCOUNTER — Ambulatory Visit (AMBULATORY_SURGERY_CENTER): Payer: Self-pay | Admitting: *Deleted

## 2014-03-10 VITALS — Ht 62.0 in | Wt 113.4 lb

## 2014-03-10 DIAGNOSIS — Z86010 Personal history of colonic polyps: Secondary | ICD-10-CM

## 2014-03-10 DIAGNOSIS — K269 Duodenal ulcer, unspecified as acute or chronic, without hemorrhage or perforation: Secondary | ICD-10-CM

## 2014-03-10 MED ORDER — MOVIPREP 100 G PO SOLR: ORAL | Status: DC

## 2014-03-10 NOTE — Progress Notes (Signed)
No allergies to eggs or soy. No problems with anesthesia.  Pt given Emmi instructions for colonoscopy  No oxygen use  No diet drug use  

## 2014-03-14 ENCOUNTER — Telehealth: Payer: Self-pay | Admitting: Internal Medicine

## 2014-03-14 NOTE — Telephone Encounter (Signed)
Left a message for patient to call back. 

## 2014-03-14 NOTE — Telephone Encounter (Signed)
Patient notified that it is not recommended with endo procedures.

## 2014-03-25 ENCOUNTER — Encounter: Payer: Self-pay | Admitting: Internal Medicine

## 2014-03-25 ENCOUNTER — Ambulatory Visit (AMBULATORY_SURGERY_CENTER): Payer: Medicare Other | Admitting: Internal Medicine

## 2014-03-25 VITALS — BP 139/69 | HR 69 | Temp 97.8°F | Resp 23 | Ht 62.0 in | Wt 113.0 lb

## 2014-03-25 DIAGNOSIS — K269 Duodenal ulcer, unspecified as acute or chronic, without hemorrhage or perforation: Secondary | ICD-10-CM

## 2014-03-25 DIAGNOSIS — Z86010 Personal history of colonic polyps: Secondary | ICD-10-CM

## 2014-03-25 DIAGNOSIS — Z8371 Family history of colonic polyps: Secondary | ICD-10-CM

## 2014-03-25 MED ORDER — SODIUM CHLORIDE 0.9 % IV SOLN: 500.0000 mL | INTRAVENOUS | Status: DC

## 2014-03-25 NOTE — Op Note (Signed)
[place]  [address]. [place], [id number]   ENDOSCOPY PROCEDURE REPORT  PATIENT: Karla Roman, Karla Roman  MR#: [id number] BIRTHDATE: 1940-10-02 , 73  yrs. old GENDER: female ENDOSCOPIST: Lafayette Dragon, MD REFERRED BY:  Janith Lima, M.D. PROCEDURE DATE:  03/25/2014 PROCEDURE:  EGD, diagnostic ASA CLASS:     Class II INDICATIONS:  history of duodenal ulcers requiring hospitalization in January 2015.  H.  pylori negative.  This is a follow-up exam. MEDICATIONS: Monitored anesthesia care and Propofol 150 mg IV TOPICAL ANESTHETIC: none  DESCRIPTION OF PROCEDURE: After the risks benefits and alternatives of the procedure were thoroughly explained, informed consent was obtained.  The LB [id number] endoscope was introduced through the mouth and advanced to the second portion of the duodenum , Without limitations.  The instrument was slowly withdrawn as the mucosa was fully examined.    Esophagus: proximal, mid and distal esophageal mucosa was normal. There was no esophagitis. Squamocolumnar junction was unremarkable. There was no stricture or hiatal hernia Stomach:  gastric folds  were unremarkable, Gastric antrum and pyloric outlet was normal. Retroflexion of the endoscope revealed normal fundus and cardia. Duodenum: There was complete resolution of duodenitis. There were no duodenal ulcers. Duodenal outlet was widely open. Second portion go between was normal[         The scope was then withdrawn from the patient and the procedure completed.  COMPLICATIONS: There were no immediate complications.  ENDOSCOPIC IMPRESSION:  1. complete resolution of duodenal ulcers and duodenitis 2. No evidence of duodenal outlet obstruction  RECOMMENDATIONS: patient may be able to reduce her PPI to when necessary only   REPEAT EXAM: no  eSigned:  Lafayette Dragon, MD 03/25/2014 11:58 AM    CC:

## 2014-03-25 NOTE — Progress Notes (Signed)
Left breast pain reported to Dr. Olevia Perches.  No further orders received.

## 2014-03-25 NOTE — Progress Notes (Signed)
Procedure ends, to recovery, report given and VSS. 

## 2014-03-25 NOTE — Op Note (Addendum)
[place]  [address]. [place], [id number]   COLONOSCOPY PROCEDURE REPORT  PATIENT: Karla Roman, Karla Roman  MR#: [id number] BIRTHDATE: [birth year] , 73  yrs. old GENDER: female ENDOSCOPIST: Lafayette Dragon, MD REFERRED QM:VHQION Evalina Field, M.D. PROCEDURE DATE:  03/25/2014 PROCEDURE:   Colonoscopy, surveillance First Screening Colonoscopy - Avg.  risk and is 50 yrs.  old or older - No.  Prior Negative Screening - Now for repeat screening. N/A  History of Adenoma - Now for follow-up colonoscopy & has been > or = to 3 yrs.  Yes hx of adenoma.  Has been 3 or more years since last colonoscopy.  Polyps Removed Today? No.  Polyps Removed Today? No.  Recommend repeat exam, <10 yrs? Polyps Removed Today? No.  Recommend repeat exam, <10 yrs? No. ASA CLASS:   Class II INDICATIONS:tubular adenoma removed on colonoscopy in October 2010. Positive family history of colon polyps.  First colonoscopy in 2003. MEDICATIONS: Monitored anesthesia care and Propofol 80 mg IV  DESCRIPTION OF PROCEDURE:   After the risks benefits and alternatives of the procedure were thoroughly explained, informed consent was obtained.  The digital rectal exam revealed no abnormalities of the rectum.   The LB [id number]  endoscope was introduced through the anus and advanced to the cecum, which was identified by both the appendix and ileocecal valve. No adverse events experienced.   The quality of the prep was good, using MoviPrep  The instrument was then slowly withdrawn as the colon was fully examined.      COLON FINDINGS: There was moderate diverticulosis noted in the sigmoid colon with associated muscular hypertrophy, colonic narrowing, angulation and tortuosity.  Retroflexed views revealed no abnormalities. The time to cecum=7 minutes 21 seconds. Withdrawal time=6 minutes 17 seconds.  The scope was withdrawn and the procedure completed. COMPLICATIONS: There were no immediate  complications.  ENDOSCOPIC IMPRESSION: 1.moderately severe diverticulosis of the sigmoid colon 2. No evidence of recurrent polyps  RECOMMENDATIONS: 1. high fiber diet 2. No recall colonoscopies due to age 29. Fiber supplements such as Benefiber 1 tablespoon daily  eSigned:  Lafayette Dragon, MD 03/25/2014 12:08 PM Revised: 03/25/2014 12:08 PM  cc:   PATIENT NAME:  Karla Roman, Karla Roman MR#: [id number]

## 2014-03-25 NOTE — Patient Instructions (Addendum)
YOU HAD AN ENDOSCOPIC PROCEDURE TODAY AT [facility]: Refer to the procedure report that was given to you for any specific questions about what was found during the examination.  If the procedure report does not answer your questions, please call your gastroenterologist to clarify.  If you requested that your care partner not be given the details of your procedure findings, then the procedure report has been included in a sealed envelope for you to review at your convenience later.  YOU SHOULD EXPECT: Some feelings of bloating in the abdomen. Passage of more gas than usual.  Walking can help get rid of the air that was put into your GI tract during the procedure and reduce the bloating. If you had a lower endoscopy (such as a colonoscopy or flexible sigmoidoscopy) you may notice spotting of blood in your stool or on the toilet paper. If you underwent a bowel prep for your procedure, then you may not have a normal bowel movement for a few days.  DIET: Your first meal following the procedure should be a light meal and then it is ok to progress to your normal diet.  A half-sandwich or bowl of soup is an example of a good first meal.  Heavy or fried foods are harder to digest and may make you feel nauseous or bloated.  Likewise meals heavy in dairy and vegetables can cause extra gas to form and this can also increase the bloating.  Drink plenty of fluids but you should avoid alcoholic beverages for 24 hours. Try to increase the fiber in your diet. Try to use benefiber 1 tablespoon daily per Dr. Olevia Perches.  ACTIVITY: Your care partner should take you home directly after the procedure.  You should plan to take it easy, moving slowly for the rest of the day.  You can resume normal activity the day after the procedure however you should NOT DRIVE or use heavy machinery for 24 hours (because of the sedation medicines used during the test).    SYMPTOMS TO REPORT IMMEDIATELY: A gastroenterologist can be  reached at any hour.  During normal business hours, 8:30 AM to 5:00 PM Monday through Friday, call [phone].  After hours and on weekends, please call the GI answering service at [phone] who will take a message and have the physician on call contact you.   Following lower endoscopy (colonoscopy or flexible sigmoidoscopy):  Excessive amounts of blood in the stool  Significant tenderness or worsening of abdominal pains  Swelling of the abdomen that is new, acute  Fever of 100F or higher  Following upper endoscopy (EGD)  Vomiting of blood or coffee ground material  New chest pain or pain under the shoulder blades  Painful or persistently difficult swallowing  New shortness of breath  Fever of 100F or higher  Black, tarry-looking stools  FOLLOW UP: If any biopsies were taken you will be contacted by phone or by letter within the next 1-3 weeks.  Call your gastroenterologist if you have not heard about the biopsies in 3 weeks.  Our staff will call the home number listed on your records the next business day following your procedure to check on you and address any questions or concerns that you may have at that time regarding the information given to you following your procedure. This is a courtesy call and so if there is no answer at the home number and we have not heard from you through the emergency physician on call, we will  assume that you have returned to your regular daily activities without incident.  SIGNATURES/CONFIDENTIALITY: You and/or your care partner have signed paperwork which will be entered into your electronic medical record.  These signatures attest to the fact that that the information above on your After Visit Summary has been reviewed and is understood.  Full responsibility of the confidentiality of this discharge information lies with you and/or your care-partner.  Please, read all of the handouts given to you by your recovery room nurse today. Only take  probiotics when you are on an antibiotic Dr.Brodie.

## 2014-03-28 ENCOUNTER — Telehealth: Payer: Self-pay | Admitting: *Deleted

## 2014-03-28 NOTE — Telephone Encounter (Signed)
  Follow up Call-  Call back number 03/25/2014  Post procedure Call Back phone  # [phone]  Permission to leave phone message Yes     Patient questions:  Do you have a fever, pain , or abdominal swelling? No. Pain Score  0 *  Have you tolerated food without any problems? Yes.    Have you been able to return to your normal activities? Yes.    Do you have any questions about your discharge instructions: Diet   No. Medications  No. Follow up visit  No.  Do you have questions or concerns about your Care? No.  Actions: * If pain score is 4 or above: No action needed, pain <4.

## 2014-04-12 ENCOUNTER — Other Ambulatory Visit: Payer: Self-pay | Admitting: Internal Medicine

## 2014-04-12 DIAGNOSIS — N281 Cyst of kidney, acquired: Secondary | ICD-10-CM

## 2014-04-13 ENCOUNTER — Encounter: Payer: Self-pay | Admitting: Pulmonary Disease

## 2014-04-21 ENCOUNTER — Telehealth: Payer: Self-pay | Admitting: Internal Medicine

## 2014-04-21 NOTE — Telephone Encounter (Signed)
Pt is questioning the MRI order, she thought she might need Renal/US, she wanted to check with provider before doing MRI. Pls advise.

## 2014-04-23 NOTE — Telephone Encounter (Signed)
MRI is better than U/S

## 2014-04-25 NOTE — Telephone Encounter (Signed)
Pt notified//lmovm 

## 2014-05-04 ENCOUNTER — Ambulatory Visit
Admission: RE | Admit: 2014-05-04 | Discharge: 2014-05-04 | Disposition: A | Discharge status: Home / Self Care | Payer: Medicare Other | Source: Ambulatory Visit | Attending: Internal Medicine | Admitting: Internal Medicine

## 2014-05-04 DIAGNOSIS — N281 Cyst of kidney, acquired: Secondary | ICD-10-CM

## 2014-05-04 MED ORDER — GADOBENATE DIMEGLUMINE 529 MG/ML IV SOLN
9.0000 mL | Freq: Once | INTRAVENOUS | Status: AC | PRN
  Administered 2014-05-04: 9 mL via INTRAVENOUS

## 2014-05-05 ENCOUNTER — Encounter: Payer: Self-pay | Admitting: Internal Medicine

## 2014-06-30 ENCOUNTER — Other Ambulatory Visit: Payer: Self-pay | Admitting: Gastroenterology

## 2014-07-19 ENCOUNTER — Encounter: Payer: Self-pay | Admitting: Internal Medicine

## 2014-07-19 ENCOUNTER — Ambulatory Visit (INDEPENDENT_AMBULATORY_CARE_PROVIDER_SITE_OTHER): Payer: Medicare Other | Admitting: Internal Medicine

## 2014-07-19 VITALS — BP 136/66 | HR 70 | Temp 98.6°F | Resp 16 | Ht 62.0 in | Wt 116.0 lb

## 2014-07-19 DIAGNOSIS — H6981 Other specified disorders of Eustachian tube, right ear: Secondary | ICD-10-CM

## 2014-07-19 DIAGNOSIS — J302 Other seasonal allergic rhinitis: Secondary | ICD-10-CM

## 2014-07-19 DIAGNOSIS — H6980 Other specified disorders of Eustachian tube, unspecified ear: Secondary | ICD-10-CM | POA: Insufficient documentation

## 2014-07-19 DIAGNOSIS — H6990 Unspecified Eustachian tube disorder, unspecified ear: Secondary | ICD-10-CM | POA: Insufficient documentation

## 2014-07-19 MED ORDER — CETIRIZINE HCL 10 MG PO TABS: 10.0000 mg | ORAL_TABLET | Freq: Every day | ORAL | Status: DC

## 2014-07-19 MED ORDER — METHYLPREDNISOLONE 4 MG PO KIT: PACK | ORAL | Status: DC

## 2014-07-19 NOTE — Progress Notes (Signed)
Subjective:    Patient ID: Karla Roman, female    DOB: Jul 30, 1940, 74 y.o.   MRN: [id number]  HPI Comments: She complains of nasal congestion and right ear pain for 2-3 weeks.     Review of Systems  Constitutional: Negative.  Negative for fever, chills, diaphoresis, appetite change and fatigue.  HENT: Positive for congestion, ear pain, postnasal drip and rhinorrhea. Negative for dental problem, drooling, ear discharge, facial swelling, sinus pressure, sore throat, tinnitus, trouble swallowing and voice change.        She has right ear pain with "popping and pressure" and a muffled sensation in her right ear, she saw a dentist last week and was told that her oral exam and dental xrays were normal.  Eyes: Negative.  Negative for visual disturbance.  Respiratory: Negative.   Cardiovascular: Negative.  Negative for chest pain, palpitations and leg swelling.  Gastrointestinal: Negative.  Negative for nausea, vomiting, abdominal pain, diarrhea, constipation and blood in stool.  Endocrine: Negative.   Genitourinary: Negative.   Musculoskeletal: Negative.   Skin: Negative.  Negative for rash.  Allergic/Immunologic: Negative.   Neurological: Negative.  Negative for dizziness, tremors, seizures, syncope, facial asymmetry, speech difficulty, weakness, light-headedness, numbness and headaches.  Hematological: Negative.  Negative for adenopathy. Does not bruise/bleed easily.  Psychiatric/Behavioral: Negative.        Objective:   Physical Exam  Constitutional: She is oriented to person, place, and time. She appears well-developed and well-nourished. No distress.  HENT:  Right Ear: Hearing, tympanic membrane, external ear and ear canal normal. No mastoid tenderness. Tympanic membrane is not injected, not scarred, not perforated, not erythematous, not retracted and not bulging.  Left Ear: Hearing, tympanic membrane, external ear and ear canal normal. No mastoid tenderness. Tympanic membrane  is not injected, not scarred, not perforated, not erythematous, not retracted and not bulging.  Nose: Mucosal edema and rhinorrhea present. No nose lacerations, sinus tenderness, nasal deformity, septal deviation or nasal septal hematoma. No epistaxis.  No foreign bodies. Right sinus exhibits no maxillary sinus tenderness and no frontal sinus tenderness. Left sinus exhibits no maxillary sinus tenderness and no frontal sinus tenderness.  Mouth/Throat: Oropharynx is clear and moist and mucous membranes are normal. Mucous membranes are not pale, not dry and not cyanotic. No oral lesions. No trismus in the jaw. No uvula swelling. No oropharyngeal exudate, posterior oropharyngeal edema, posterior oropharyngeal erythema or tonsillar abscesses.  Eyes: Conjunctivae are normal. Right eye exhibits no discharge. Left eye exhibits no discharge. No scleral icterus.  Neck: Normal range of motion. Neck supple. No JVD present. No tracheal deviation present. No thyromegaly present.  Cardiovascular: Normal rate, regular rhythm, normal heart sounds and intact distal pulses.  Exam reveals no gallop and no friction rub.   No murmur heard. Pulmonary/Chest: Effort normal and breath sounds normal. No stridor. No respiratory distress. She has no wheezes. She has no rales. She exhibits no tenderness.  Abdominal: Soft. Bowel sounds are normal. She exhibits no distension and no mass. There is no tenderness. There is no rebound and no guarding.  Musculoskeletal: Normal range of motion. She exhibits no edema or tenderness.  Lymphadenopathy:    She has no cervical adenopathy.  Neurological: She is oriented to person, place, and time.  Skin: Skin is warm and dry. No rash noted. She is not diaphoretic. No erythema. No pallor.  Vitals reviewed.     Lab Results  Component Value Date   WBC 5.5 09/02/2013   HGB 12.6 09/02/2013  HCT 37.8 09/02/2013   PLT 236.0 09/02/2013   GLUCOSE 92 12/27/2013   CHOL 232* 12/27/2013   TRIG  55.0 12/27/2013   HDL 99.70 12/27/2013   LDLDIRECT 115.1 12/23/2012   LDLCALC 121* 12/27/2013   ALT 18 12/27/2013   AST 22 12/27/2013   NA 140 12/27/2013   K 4.6 12/27/2013   CL 103 12/27/2013   CREATININE 0.8 12/27/2013   BUN 17 12/27/2013   CO2 29 12/27/2013   TSH 1.29 12/27/2013      Assessment & Plan:

## 2014-07-19 NOTE — Progress Notes (Signed)
Pre visit review using our clinic review tool, if applicable. No additional management support is needed unless otherwise documented below in the visit note. 

## 2014-07-19 NOTE — Patient Instructions (Signed)
Barotitis Media Barotitis media is inflammation of your middle ear. This occurs when the auditory tube (eustachian tube) leading from the back of your nose (nasopharynx) to your eardrum is blocked. This blockage may result from a cold, environmental allergies, or an upper respiratory infection. Unresolved barotitis media may lead to damage or hearing loss (barotrauma), which may become permanent. HOME CARE INSTRUCTIONS   Use medicines as recommended by your health care provider. Over-the-counter medicines will help unblock the canal and can help during times of air travel.  Do not put anything into your ears to clean or unplug them. Eardrops will not be helpful.  Do not swim, dive, or fly until your health care provider says it is all right to do so. If these activities are necessary, chewing gum with frequent, forceful swallowing may help. It is also helpful to hold your nose and gently blow to pop your ears for equalizing pressure changes. This forces air into the eustachian tube.  Only take over-the-counter or prescription medicines for pain, discomfort, or fever as directed by your health care provider.  A decongestant may be helpful in decongesting the middle ear and make pressure equalization easier. SEEK MEDICAL CARE IF:  You experience a serious form of dizziness in which you feel as if the room is spinning and you feel nauseated (vertigo).  Your symptoms only involve one ear. SEEK IMMEDIATE MEDICAL CARE IF:   You develop a severe headache, dizziness, or severe ear pain.  You have bloody or pus-like drainage from your ears.  You develop a fever.  Your problems do not improve or become worse. MAKE SURE YOU:   Understand these instructions.  Will watch your condition.  Will get help right away if you are not doing well or get worse. Document Released: 04/26/2000 Document Revised: 02/17/2013 Document Reviewed: 11/24/2012 ExitCare Patient Information 2015 [employer]. This  information is not intended to replace advice given to you by your health care provider. Make sure you discuss any questions you have with your health care provider.  

## 2014-07-20 NOTE — Assessment & Plan Note (Signed)
She is having a flare up of her symptoms so I have asked her to start zyrtec and will also treat with a medrol dose pak

## 2014-07-20 NOTE — Assessment & Plan Note (Signed)
Will treat with zyrtec and medrol dose pak

## 2014-07-29 ENCOUNTER — Telehealth: Payer: Self-pay | Admitting: Internal Medicine

## 2014-07-29 NOTE — Telephone Encounter (Signed)
Patient notified that should only take the 21 day course. I advised that Rx was sent to the local [facility] and unsure why mail order would have sent. She stated that mail order has not deleted the Rx as well as the refills.

## 2014-07-29 NOTE — Telephone Encounter (Signed)
Pt called stated she got methylPREDNISolone (MEDROL, PAK,) 4 MG tablet from the mail yesterday and did not know is she was suppose to continue taking this medication. Please call her back

## 2014-08-04 DIAGNOSIS — Z803 Family history of malignant neoplasm of breast: Secondary | ICD-10-CM | POA: Diagnosis not present

## 2014-08-04 DIAGNOSIS — Z1231 Encounter for screening mammogram for malignant neoplasm of breast: Secondary | ICD-10-CM | POA: Diagnosis not present

## 2014-08-04 LAB — HM MAMMOGRAPHY: HM Mammogram: NORMAL

## 2014-08-09 ENCOUNTER — Ambulatory Visit (INDEPENDENT_AMBULATORY_CARE_PROVIDER_SITE_OTHER): Payer: Medicare Other | Admitting: Internal Medicine

## 2014-08-09 ENCOUNTER — Encounter: Payer: Self-pay | Admitting: Internal Medicine

## 2014-08-09 ENCOUNTER — Other Ambulatory Visit (INDEPENDENT_AMBULATORY_CARE_PROVIDER_SITE_OTHER): Payer: Medicare Other

## 2014-08-09 VITALS — BP 138/90 | HR 66 | Temp 98.0°F | Resp 16 | Ht 62.0 in | Wt 118.0 lb

## 2014-08-09 DIAGNOSIS — K269 Duodenal ulcer, unspecified as acute or chronic, without hemorrhage or perforation: Secondary | ICD-10-CM | POA: Diagnosis not present

## 2014-08-09 DIAGNOSIS — K264 Chronic or unspecified duodenal ulcer with hemorrhage: Secondary | ICD-10-CM

## 2014-08-09 DIAGNOSIS — J302 Other seasonal allergic rhinitis: Secondary | ICD-10-CM

## 2014-08-09 DIAGNOSIS — G501 Atypical facial pain: Secondary | ICD-10-CM | POA: Diagnosis not present

## 2014-08-09 DIAGNOSIS — K2100 Gastro-esophageal reflux disease with esophagitis, without bleeding: Secondary | ICD-10-CM

## 2014-08-09 LAB — CBC WITH DIFFERENTIAL/PLATELET
Basophils Absolute: 0 10*3/uL (ref 0.0–0.1)
Basophils Relative: 0.4 % (ref 0.0–3.0)
Eosinophils Absolute: 0.1 10*3/uL (ref 0.0–0.7)
Eosinophils Relative: 1.8 % (ref 0.0–5.0)
HCT: 37.9 % (ref 36.0–46.0)
Hemoglobin: 13 g/dL (ref 12.0–15.0)
Lymphocytes Relative: 24.8 % (ref 12.0–46.0)
Lymphs Abs: 1.6 10*3/uL (ref 0.7–4.0)
MCHC: 34.3 g/dL (ref 30.0–36.0)
MCV: 90.3 fl (ref 78.0–100.0)
Monocytes Absolute: 0.5 10*3/uL (ref 0.1–1.0)
Monocytes Relative: 7.9 % (ref 3.0–12.0)
Neutro Abs: 4.1 10*3/uL (ref 1.4–7.7)
Neutrophils Relative %: 65.1 % (ref 43.0–77.0)
Platelets: 180 10*3/uL (ref 150.0–400.0)
RBC: 4.19 Mil/uL (ref 3.87–5.11)
RDW: 13.7 % (ref 11.5–15.5)
WBC: 6.4 10*3/uL (ref 4.0–10.5)

## 2014-08-09 LAB — BASIC METABOLIC PANEL WITH GFR
BUN: 15 mg/dL (ref 6–23)
CO2: 30 meq/L (ref 19–32)
Calcium: 9.6 mg/dL (ref 8.4–10.5)
Chloride: 104 meq/L (ref 96–112)
Creatinine, Ser: 0.74 mg/dL (ref 0.40–1.20)
GFR: 81.27 mL/min
Glucose, Bld: 93 mg/dL (ref 70–99)
Potassium: 4 meq/L (ref 3.5–5.1)
Sodium: 138 meq/L (ref 135–145)

## 2014-08-09 MED ORDER — PANTOPRAZOLE SODIUM 40 MG PO TBEC: DELAYED_RELEASE_TABLET | ORAL | Status: DC

## 2014-08-09 MED ORDER — GABAPENTIN 100 MG PO CAPS: 100.0000 mg | ORAL_CAPSULE | Freq: Three times a day (TID) | ORAL | Status: DC

## 2014-08-09 NOTE — Patient Instructions (Signed)
Trigeminal Neuralgia  Trigeminal neuralgia is a nerve disorder that causes sudden attacks of severe facial pain. It is caused by damage to the trigeminal nerve, a major nerve in the face. It is more common in women and in the elderly, although it can also happen in younger patients. Attacks last from a few seconds to several minutes and can occur from a couple of times per year to several times per day. Trigeminal neuralgia can be a very distressing and disabling condition. Surgery may be needed in very severe cases if medical treatment does not give relief.  HOME CARE INSTRUCTIONS    If your caregiver prescribed medication to help prevent attacks, take as directed.   To help prevent attacks:   Chew on the unaffected side of the mouth.   Avoid touching your face.   Avoid blasts of hot or cold air.   Men may wish to grow a beard to avoid having to shave.  SEEK IMMEDIATE MEDICAL CARE IF:   Pain is unbearable and your medicine does not help.   You develop new, unexplained symptoms (problems).   You have problems that may be related to a medication you are taking.  Document Released: 04/26/2000 Document Revised: 07/22/2011 Document Reviewed: 02/24/2009  ExitCare Patient Information 2015 [employer]. This information is not intended to replace advice given to you by your health care provider. Make sure you discuss any questions you have with your health care provider.

## 2014-08-09 NOTE — Progress Notes (Signed)
Subjective:    Patient ID: Karla Roman, female    DOB: 04/16/1941, 74 y.o.   MRN: [id number]  HPI Comments: She returns for f/up regarding pain on the right side of her face extending around to the right ear. It is mostly a dull pain but there is an occasional sharp sensation. She was treated for eustachian tube dysfunction with steroids and that helped "some" but not much, she also gets some relief with tylenol.  Gastrophageal Reflux She reports no abdominal pain, no chest pain, no choking, no coughing, no nausea or no sore throat. Pertinent negatives include no fatigue.      Review of Systems  Constitutional: Negative.  Negative for fever, chills, diaphoresis, appetite change and fatigue.  HENT: Negative.  Negative for congestion, dental problem, facial swelling, hearing loss, sinus pressure, sore throat, trouble swallowing and voice change.   Eyes: Negative.   Respiratory: Negative.  Negative for cough, choking, chest tightness, shortness of breath and stridor.   Cardiovascular: Negative.  Negative for chest pain, palpitations and leg swelling.  Gastrointestinal: Negative.  Negative for nausea, vomiting, abdominal pain, diarrhea and constipation.  Endocrine: Negative.   Genitourinary: Negative.   Musculoskeletal: Negative.  Negative for myalgias, back pain, joint swelling and arthralgias.  Skin: Negative.   Allergic/Immunologic: Negative.   Neurological: Negative.  Negative for dizziness, tremors, seizures, syncope, facial asymmetry, speech difficulty, weakness, light-headedness, numbness and headaches.  Hematological: Negative.  Negative for adenopathy. Does not bruise/bleed easily.  Psychiatric/Behavioral: Negative.        Objective:   Physical Exam  Constitutional: She is oriented to person, place, and time. She appears well-developed and well-nourished. No distress.  HENT:  Head: Normocephalic and atraumatic.  Right Ear: Hearing, tympanic membrane, external ear and  ear canal normal.  Left Ear: Hearing, tympanic membrane, external ear and ear canal normal.  Mouth/Throat: Oropharynx is clear and moist. No oropharyngeal exudate.  Eyes: Conjunctivae and EOM are normal. Pupils are equal, round, and reactive to light. Right eye exhibits no discharge. Left eye exhibits no discharge. No scleral icterus.  Neck: Normal range of motion. Neck supple. No JVD present. No tracheal deviation present. No thyromegaly present.  Cardiovascular: Normal rate, regular rhythm, normal heart sounds and intact distal pulses.  Exam reveals no gallop and no friction rub.   No murmur heard. Pulmonary/Chest: Effort normal and breath sounds normal. No stridor. No respiratory distress. She has no wheezes. She has no rales. She exhibits no tenderness.  Abdominal: Soft. Bowel sounds are normal. She exhibits no distension and no mass. There is no tenderness. There is no rebound and no guarding.  Musculoskeletal: Normal range of motion. She exhibits no edema or tenderness.  Lymphadenopathy:    She has no cervical adenopathy.  Neurological: She is alert and oriented to person, place, and time. She has normal reflexes. She displays normal reflexes. No cranial nerve deficit. She exhibits normal muscle tone. Coordination normal.  Skin: Skin is warm and dry. No rash noted. She is not diaphoretic. No erythema. No pallor.  Psychiatric: She has a normal mood and affect. Her behavior is normal. Judgment and thought content normal.  Vitals reviewed.    Lab Results  Component Value Date   WBC 5.5 09/02/2013   HGB 12.6 09/02/2013   HCT 37.8 09/02/2013   PLT 236.0 09/02/2013   GLUCOSE 92 12/27/2013   CHOL 232* 12/27/2013   TRIG 55.0 12/27/2013   HDL 99.70 12/27/2013   LDLDIRECT 115.1 12/23/2012   LDLCALC 121*  12/27/2013   ALT 18 12/27/2013   AST 22 12/27/2013   NA 140 12/27/2013   K 4.6 12/27/2013   CL 103 12/27/2013   CREATININE 0.8 12/27/2013   BUN 17 12/27/2013   CO2 29 12/27/2013    TSH 1.29 12/27/2013       Assessment & Plan:

## 2014-08-10 ENCOUNTER — Encounter: Payer: Self-pay | Admitting: Internal Medicine

## 2014-08-10 NOTE — Assessment & Plan Note (Signed)
She is doing well on the PPI - will continue

## 2014-08-10 NOTE — Assessment & Plan Note (Signed)
I am concerned that she may have trigeminal neuralgia, I tried to prescribe lyrica but it is not on her formulary, will try neurontin I will screen for a mass or tumor that may be causing her symptoms with an MRI

## 2014-08-30 ENCOUNTER — Telehealth: Payer: Self-pay | Admitting: Internal Medicine

## 2014-08-30 ENCOUNTER — Other Ambulatory Visit: Payer: Self-pay | Admitting: Internal Medicine

## 2014-08-30 DIAGNOSIS — G501 Atypical facial pain: Secondary | ICD-10-CM

## 2014-08-30 NOTE — Progress Notes (Signed)
It will need to be both I want the MRI of the brain to be done

## 2014-08-30 NOTE — Telephone Encounter (Signed)
Karla Roman at [facility] advised order has been updated per dr Ronnald Ramp note

## 2014-08-30 NOTE — Telephone Encounter (Signed)
Karla Roman [phone] at [facility] imaging   She has questions about mri that was put in.   She says that in needs to be in as an orbit. 2 Different referrals if you want the brain?

## 2014-08-30 NOTE — Telephone Encounter (Signed)
Orbit added

## 2014-08-31 NOTE — Telephone Encounter (Signed)
Received note from [facility] that Mr orbits order need to be placed as with and without. Order updated in [place] per [facility]. request.

## 2014-09-01 ENCOUNTER — Ambulatory Visit
Admission: RE | Admit: 2014-09-01 | Discharge: 2014-09-01 | Disposition: A | Discharge status: Home / Self Care | Payer: Medicare Other | Source: Ambulatory Visit | Attending: Internal Medicine | Admitting: Internal Medicine

## 2014-09-01 ENCOUNTER — Inpatient Hospital Stay: Admission: RE | Admit: 2014-09-01 | Payer: Medicare Other | Source: Ambulatory Visit

## 2014-09-01 DIAGNOSIS — G501 Atypical facial pain: Secondary | ICD-10-CM | POA: Diagnosis not present

## 2014-09-01 MED ORDER — GADOBENATE DIMEGLUMINE 529 MG/ML IV SOLN
10.0000 mL | Freq: Once | INTRAVENOUS | Status: AC | PRN
  Administered 2014-09-01: 10 mL via INTRAVENOUS

## 2014-09-03 ENCOUNTER — Encounter: Payer: Self-pay | Admitting: Internal Medicine

## 2014-09-19 DIAGNOSIS — D2271 Melanocytic nevi of right lower limb, including hip: Secondary | ICD-10-CM | POA: Diagnosis not present

## 2014-09-19 DIAGNOSIS — L821 Other seborrheic keratosis: Secondary | ICD-10-CM | POA: Diagnosis not present

## 2014-09-19 DIAGNOSIS — D235 Other benign neoplasm of skin of trunk: Secondary | ICD-10-CM | POA: Diagnosis not present

## 2014-09-19 DIAGNOSIS — D225 Melanocytic nevi of trunk: Secondary | ICD-10-CM | POA: Diagnosis not present

## 2014-10-11 ENCOUNTER — Ambulatory Visit (INDEPENDENT_AMBULATORY_CARE_PROVIDER_SITE_OTHER)
Admission: RE | Admit: 2014-10-11 | Discharge: 2014-10-11 | Disposition: A | Discharge status: Home / Self Care | Payer: Medicare Other | Source: Ambulatory Visit | Attending: Internal Medicine | Admitting: Internal Medicine

## 2014-10-11 ENCOUNTER — Encounter: Payer: Self-pay | Admitting: Internal Medicine

## 2014-10-11 ENCOUNTER — Ambulatory Visit (INDEPENDENT_AMBULATORY_CARE_PROVIDER_SITE_OTHER): Payer: Medicare Other | Admitting: Internal Medicine

## 2014-10-11 VITALS — BP 122/68 | HR 72 | Temp 98.1°F | Resp 16 | Ht 62.0 in | Wt 118.0 lb

## 2014-10-11 DIAGNOSIS — M9971 Connective tissue and disc stenosis of intervertebral foramina of cervical region: Secondary | ICD-10-CM | POA: Diagnosis not present

## 2014-10-11 DIAGNOSIS — M4802 Spinal stenosis, cervical region: Secondary | ICD-10-CM | POA: Insufficient documentation

## 2014-10-11 DIAGNOSIS — M542 Cervicalgia: Secondary | ICD-10-CM | POA: Diagnosis not present

## 2014-10-11 DIAGNOSIS — Z981 Arthrodesis status: Secondary | ICD-10-CM | POA: Diagnosis not present

## 2014-10-11 DIAGNOSIS — M4312 Spondylolisthesis, cervical region: Secondary | ICD-10-CM | POA: Diagnosis not present

## 2014-10-11 NOTE — Progress Notes (Signed)
Subjective:  Patient ID: Karla Roman, female    DOB: [birth year]  Age: 74 y.o. MRN: [id number]  CC: Neck Pain   HPI Karla Roman presents for recurrent episodes of neck pain over the last month, she develops acute episodes of bilateral sharp pain that catches her off guard, she takes tylenol and gets relief from the pain. She had c-spine surgery (fusion) over a decade ago. The pain does not radiate and there is no N/W/T in her arms or legs. She also complains of LBP that occasionally radiates into her LLE.  Outpatient Prescriptions Prior to Visit  Medication Sig Dispense Refill  . acetaminophen (TYLENOL) 500 MG tablet Take 500-1,000 mg by mouth every 6 (six) hours as needed for mild pain or headache.    . Biotin 300 MCG TABS Take by mouth daily.    . Calcium Carbonate (CALTRATE 600) 1500 MG TABS Take 1 tablet by mouth 2 (two) times daily. chewables    . cetirizine (ZYRTEC) 10 MG tablet Take 1 tablet (10 mg total) by mouth daily. 30 tablet 11  . Cholecalciferol (VITAMIN D-3 PO) Take 400 Units by mouth daily.    . cyanocobalamin 500 MCG tablet Take 500 mcg by mouth every morning.    . gabapentin (NEURONTIN) 100 MG capsule Take 1 capsule (100 mg total) by mouth 3 (three) times daily. 90 capsule 11  . glucosamine-chondroitin 500-400 MG tablet Take 1 tablet by mouth 2 (two) times daily.    . Multiple Vitamins-Minerals (MULTIVITAMIN WITH MINERALS) tablet Take 1 tablet by mouth every morning. chewables    . omega-3 acid ethyl esters (LOVAZA) 1 G capsule Take 1 g by mouth every morning.    . pantoprazole (PROTONIX) 40 MG tablet Take 1 tablet daily before  breakfast 90 tablet 3  . Psyllium (METAMUCIL PO) Take by mouth daily.    Marland Kitchen saccharomyces boulardii (FLORASTOR) 250 MG capsule Take 1 capsule (250 mg total) by mouth 2 (two) times daily. 60 capsule 0  . vitamin C (ASCORBIC ACID) 250 MG tablet Take 250 mg by mouth 2 (two) times daily.    . vitamin E 400 UNIT capsule Take 400 Units by  mouth every morning.      No facility-administered medications prior to visit.    ROS Review of Systems  Constitutional: Negative.  Negative for fever, chills, diaphoresis, appetite change and fatigue.  HENT: Negative.   Eyes: Negative.   Respiratory: Negative.  Negative for cough, choking, chest tightness, shortness of breath and stridor.   Cardiovascular: Negative.  Negative for chest pain, palpitations and leg swelling.  Gastrointestinal: Negative.  Negative for nausea, vomiting, abdominal pain, diarrhea, constipation and blood in stool.  Endocrine: Negative.   Genitourinary: Negative.   Musculoskeletal: Positive for back pain, neck pain and neck stiffness. Negative for myalgias, joint swelling, arthralgias and gait problem.  Skin: Negative.   Allergic/Immunologic: Negative.   Neurological: Negative.  Negative for dizziness, syncope, light-headedness, numbness and headaches.  Hematological: Negative.  Negative for adenopathy. Does not bruise/bleed easily.  Psychiatric/Behavioral: Negative.     Objective:  BP 122/68 mmHg  Pulse 72  Temp(Src) 98.1 F (36.7 C) (Oral)  Resp 16  Ht 5\' 2"  (1.575 m)  Wt 118 lb (53.524 kg)  BMI 21.58 kg/m2  SpO2 95%  BP Readings from Last 3 Encounters:  10/11/14 122/68  08/09/14 138/90  07/19/14 136/66    Wt Readings from Last 3 Encounters:  10/11/14 118 lb (53.524 kg)  08/09/14 118 lb (53.524 kg)  07/19/14 116 lb (52.617 kg)    Physical Exam  Constitutional: She is oriented to person, place, and time. She appears well-developed and well-nourished. No distress.  HENT:  Head: Normocephalic and atraumatic.  Mouth/Throat: Oropharynx is clear and moist. No oropharyngeal exudate.  Eyes: Conjunctivae are normal. Right eye exhibits no discharge. Left eye exhibits no discharge. No scleral icterus.  Neck: Normal range of motion. Neck supple. No JVD present. No tracheal deviation present. No thyromegaly present.  Cardiovascular: Normal rate,  regular rhythm, normal heart sounds and intact distal pulses.  Exam reveals no gallop and no friction rub.   No murmur heard. Pulmonary/Chest: Effort normal and breath sounds normal. No stridor. No respiratory distress. She has no wheezes. She has no rales. She exhibits no tenderness.  Abdominal: Soft. Bowel sounds are normal. She exhibits no distension and no mass. There is no tenderness. There is no rebound and no guarding.  Musculoskeletal: Normal range of motion. She exhibits no edema or tenderness.       Cervical back: Normal. She exhibits normal range of motion, no tenderness, no bony tenderness, no swelling, no edema, no deformity, no laceration, no pain, no spasm and normal pulse.  Lymphadenopathy:    She has no cervical adenopathy.  Neurological: She is oriented to person, place, and time. She has normal strength. She displays no atrophy, no tremor and normal reflexes. No cranial nerve deficit or sensory deficit. She exhibits normal muscle tone. She displays a negative Romberg sign. She displays no seizure activity. Coordination and gait normal.  Reflex Scores:      Tricep reflexes are 1+ on the right side and 1+ on the left side.      Bicep reflexes are 1+ on the right side and 1+ on the left side.      Brachioradialis reflexes are 1+ on the right side and 1+ on the left side.      Patellar reflexes are 1+ on the right side and 1+ on the left side.      Achilles reflexes are 1+ on the right side and 1+ on the left side. Skin: Skin is warm and dry. No rash noted. She is not diaphoretic. No erythema. No pallor.  Vitals reviewed.   Lab Results  Component Value Date   WBC 6.4 08/09/2014   HGB 13.0 08/09/2014   HCT 37.9 08/09/2014   PLT 180.0 08/09/2014   GLUCOSE 93 08/09/2014   CHOL 232* 12/27/2013   TRIG 55.0 12/27/2013   HDL 99.70 12/27/2013   LDLDIRECT 115.1 12/23/2012   LDLCALC 121* 12/27/2013   ALT 18 12/27/2013   AST 22 12/27/2013   NA 138 08/09/2014   K 4.0 08/09/2014     CL 104 08/09/2014   CREATININE 0.74 08/09/2014   BUN 15 08/09/2014   CO2 30 08/09/2014   TSH 1.29 12/27/2013    Dg Cervical Spine Complete  10/11/2014   CLINICAL DATA:  A remnant mid and right posterior neck pain over the past 3 weeks without reported injury. History of previous anterior fusion from C4 through C7.  EXAM: CERVICAL SPINE  4+ VIEWS  COMPARISON:  None.  FINDINGS: The cervical vertebral bodies are preserved in height. There is fusion across the C4-5, C5-6, and C6-7 disc spaces. The metallic fusion hardware is intact. C1, C2, and C3 exhibit no acute abnormalities. There is mild anterolisthesis of C7 with respect to T1. There is no significant disc space narrowing. The spinous processes are intact. There is mild bony encroachment upon  the C3 neural foramen on the left due to facet joint and uncovertebral joint osteophyte. The odontoid is intact.  IMPRESSION: 1. The patient has undergone anterior fusion from C4 through C7. The fusion hardware is intact. There is mild disc space narrowing at C7-T1 with mild anterolisthesis of C7 with respect to T1 2. There is mild bony encroachment upon the C3 neural foramen on the left.   Electronically Signed   By: David  Martinique M.D.   On: 10/11/2014 14:31    Assessment & Plan:   Karla Roman was seen today for neck pain.  Diagnoses and all orders for this visit:  Neck pain, bilateral - will cont tylenol for pain, she does not want anything else for pain Orders: -     DG Cervical Spine Complete; Future -     Ambulatory referral to Neurosurgery  Spinal stenosis of cervical region - I have asked her to f/up with her NS, Dr. Sherwood Gambler Orders: -     Ambulatory referral to Neurosurgery   I am having Ms. Kostelnik maintain her Calcium Carbonate, multivitamin with minerals, vitamin E, acetaminophen, cyanocobalamin, omega-3 acid ethyl esters, saccharomyces boulardii, Psyllium (METAMUCIL PO), Biotin, vitamin C, glucosamine-chondroitin, Cholecalciferol  (VITAMIN D-3 PO), cetirizine, pantoprazole, and gabapentin.  No orders of the defined types were placed in this encounter.     Follow-up: Return in about 4 weeks (around 11/08/2014).  Scarlette Calico, MD

## 2014-10-11 NOTE — Patient Instructions (Signed)

## 2014-10-11 NOTE — Progress Notes (Signed)
Pre visit review using our clinic review tool, if applicable. No additional management support is needed unless otherwise documented below in the visit note. 

## 2014-10-24 DIAGNOSIS — M542 Cervicalgia: Secondary | ICD-10-CM | POA: Diagnosis not present

## 2014-10-24 DIAGNOSIS — M5030 Other cervical disc degeneration, unspecified cervical region: Secondary | ICD-10-CM | POA: Diagnosis not present

## 2014-10-24 DIAGNOSIS — M546 Pain in thoracic spine: Secondary | ICD-10-CM | POA: Diagnosis not present

## 2014-10-24 DIAGNOSIS — M47812 Spondylosis without myelopathy or radiculopathy, cervical region: Secondary | ICD-10-CM | POA: Diagnosis not present

## 2014-10-24 DIAGNOSIS — M549 Dorsalgia, unspecified: Secondary | ICD-10-CM | POA: Diagnosis not present

## 2014-11-10 ENCOUNTER — Ambulatory Visit (INDEPENDENT_AMBULATORY_CARE_PROVIDER_SITE_OTHER): Payer: Medicare Other | Admitting: Internal Medicine

## 2014-11-10 VITALS — BP 136/68 | HR 61 | Temp 97.9°F | Ht 62.0 in | Wt 120.2 lb

## 2014-11-10 DIAGNOSIS — M4802 Spinal stenosis, cervical region: Secondary | ICD-10-CM

## 2014-11-10 NOTE — Progress Notes (Signed)
Pre visit review using our clinic review tool, if applicable. No additional management support is needed unless otherwise documented below in the visit note. 

## 2014-11-10 NOTE — Patient Instructions (Signed)

## 2014-11-14 ENCOUNTER — Encounter: Payer: Self-pay | Admitting: Internal Medicine

## 2014-11-14 NOTE — Assessment & Plan Note (Signed)
Recent plain films show stability in the hardware from the prior fusion She is without any s/s at this time and she does not wish to pursue this any further She will cont tylenol as needed

## 2014-11-14 NOTE — Progress Notes (Signed)
Subjective:  Patient ID: Karla Roman, female    DOB: 08/01/1940  Age: 74 y.o. MRN: [id number]  CC: No chief complaint on file.   HPI Karla Roman presents for follow up, her neck pain has resolved. She feels well today and offers no complaints.  Outpatient Prescriptions Prior to Visit  Medication Sig Dispense Refill  . acetaminophen (TYLENOL) 500 MG tablet Take 500-1,000 mg by mouth every 6 (six) hours as needed for mild pain or headache.    . Biotin 300 MCG TABS Take by mouth daily.    . Calcium Carbonate (CALTRATE 600) 1500 MG TABS Take 1 tablet by mouth 2 (two) times daily. chewables    . cetirizine (ZYRTEC) 10 MG tablet Take 1 tablet (10 mg total) by mouth daily. 30 tablet 11  . Cholecalciferol (VITAMIN D-3 PO) Take 400 Units by mouth daily.    . cyanocobalamin 500 MCG tablet Take 500 mcg by mouth every morning.    Marland Kitchen glucosamine-chondroitin 500-400 MG tablet Take 1 tablet by mouth 2 (two) times daily.    . Multiple Vitamins-Minerals (MULTIVITAMIN WITH MINERALS) tablet Take 1 tablet by mouth every morning. chewables    . omega-3 acid ethyl esters (LOVAZA) 1 G capsule Take 1 g by mouth every morning.    . pantoprazole (PROTONIX) 40 MG tablet Take 1 tablet daily before  breakfast 90 tablet 3  . Psyllium (METAMUCIL PO) Take by mouth daily.    Marland Kitchen saccharomyces boulardii (FLORASTOR) 250 MG capsule Take 1 capsule (250 mg total) by mouth 2 (two) times daily. 60 capsule 0  . vitamin C (ASCORBIC ACID) 250 MG tablet Take 250 mg by mouth 2 (two) times daily.    . vitamin E 400 UNIT capsule Take 400 Units by mouth every morning.     . gabapentin (NEURONTIN) 100 MG capsule Take 1 capsule (100 mg total) by mouth 3 (three) times daily. (Patient not taking: Reported on 11/10/2014) 90 capsule 11   No facility-administered medications prior to visit.    ROS Review of Systems  Constitutional: Negative.   HENT: Negative.  Negative for trouble swallowing and voice change.   Eyes:  Negative.   Respiratory: Negative.  Negative for cough, choking, chest tightness, shortness of breath and stridor.   Cardiovascular: Negative.  Negative for chest pain, palpitations and leg swelling.  Gastrointestinal: Negative.  Negative for abdominal pain.  Endocrine: Negative.   Genitourinary: Negative.   Musculoskeletal: Negative.  Negative for myalgias, back pain, joint swelling, arthralgias, gait problem, neck pain and neck stiffness.  Skin: Negative.  Negative for rash.  Allergic/Immunologic: Negative.   Neurological: Negative.  Negative for dizziness, tremors, weakness, light-headedness and numbness.  Hematological: Negative.  Negative for adenopathy. Does not bruise/bleed easily.  Psychiatric/Behavioral: Negative.     Objective:  BP 136/68 mmHg  Pulse 61  Temp(Src) 97.9 F (36.6 C) (Oral)  Ht 5\' 2"  (1.575 m)  Wt 120 lb 4 oz (54.545 kg)  BMI 21.99 kg/m2  SpO2 97%  BP Readings from Last 3 Encounters:  11/10/14 136/68  10/11/14 122/68  08/09/14 138/90    Wt Readings from Last 3 Encounters:  11/10/14 120 lb 4 oz (54.545 kg)  10/11/14 118 lb (53.524 kg)  08/09/14 118 lb (53.524 kg)    Physical Exam  Constitutional: She is oriented to person, place, and time. She appears well-developed and well-nourished. No distress.  HENT:  Head: Normocephalic and atraumatic.  Mouth/Throat: Oropharynx is clear and moist. No oropharyngeal exudate.  Eyes: Conjunctivae  are normal. Right eye exhibits no discharge. Left eye exhibits no discharge. No scleral icterus.  Neck: Normal range of motion. Neck supple. No JVD present. No tracheal deviation present. No thyromegaly present.  Cardiovascular: Normal rate, regular rhythm, normal heart sounds and intact distal pulses.  Exam reveals no gallop and no friction rub.   No murmur heard. Pulmonary/Chest: Effort normal and breath sounds normal. No stridor. No respiratory distress. She has no wheezes. She has no rales. She exhibits no tenderness.   Abdominal: Soft. Bowel sounds are normal. She exhibits no distension and no mass. There is no tenderness. There is no rebound and no guarding.  Musculoskeletal: Normal range of motion. She exhibits no edema or tenderness.  Lymphadenopathy:    She has no cervical adenopathy.  Neurological: She is alert and oriented to person, place, and time. She has normal reflexes. She displays normal reflexes. No cranial nerve deficit. She exhibits normal muscle tone. Coordination normal.  Skin: Skin is warm and dry. No rash noted. She is not diaphoretic. No erythema. No pallor.  Psychiatric: She has a normal mood and affect. Her behavior is normal. Judgment and thought content normal.  Vitals reviewed.   Lab Results  Component Value Date   WBC 6.4 08/09/2014   HGB 13.0 08/09/2014   HCT 37.9 08/09/2014   PLT 180.0 08/09/2014   GLUCOSE 93 08/09/2014   CHOL 232* 12/27/2013   TRIG 55.0 12/27/2013   HDL 99.70 12/27/2013   LDLDIRECT 115.1 12/23/2012   LDLCALC 121* 12/27/2013   ALT 18 12/27/2013   AST 22 12/27/2013   NA 138 08/09/2014   K 4.0 08/09/2014   CL 104 08/09/2014   CREATININE 0.74 08/09/2014   BUN 15 08/09/2014   CO2 30 08/09/2014   TSH 1.29 12/27/2013    Dg Cervical Spine Complete  10/11/2014   CLINICAL DATA:  A remnant mid and right posterior neck pain over the past 3 weeks without reported injury. History of previous anterior fusion from C4 through C7.  EXAM: CERVICAL SPINE  4+ VIEWS  COMPARISON:  None.  FINDINGS: The cervical vertebral bodies are preserved in height. There is fusion across the C4-5, C5-6, and C6-7 disc spaces. The metallic fusion hardware is intact. C1, C2, and C3 exhibit no acute abnormalities. There is mild anterolisthesis of C7 with respect to T1. There is no significant disc space narrowing. The spinous processes are intact. There is mild bony encroachment upon the C3 neural foramen on the left due to facet joint and uncovertebral joint osteophyte. The odontoid is  intact.  IMPRESSION: 1. The patient has undergone anterior fusion from C4 through C7. The fusion hardware is intact. There is mild disc space narrowing at C7-T1 with mild anterolisthesis of C7 with respect to T1 2. There is mild bony encroachment upon the C3 neural foramen on the left.   Electronically Signed   By: David  Martinique M.D.   On: 10/11/2014 14:31    Assessment & Plan:   There are no diagnoses linked to this encounter. I have discontinued Ms. Bowsher's gabapentin. I am also having her maintain her Calcium Carbonate, multivitamin with minerals, vitamin E, acetaminophen, cyanocobalamin, omega-3 acid ethyl esters, saccharomyces boulardii, Psyllium (METAMUCIL PO), Biotin, vitamin C, glucosamine-chondroitin, Cholecalciferol (VITAMIN D-3 PO), cetirizine, and pantoprazole.  No orders of the defined types were placed in this encounter.     Follow-up: Return if symptoms worsen or fail to improve.  Scarlette Calico, MD

## 2014-11-28 DIAGNOSIS — H2513 Age-related nuclear cataract, bilateral: Secondary | ICD-10-CM | POA: Diagnosis not present

## 2014-11-28 DIAGNOSIS — H52203 Unspecified astigmatism, bilateral: Secondary | ICD-10-CM | POA: Diagnosis not present

## 2014-11-28 DIAGNOSIS — H524 Presbyopia: Secondary | ICD-10-CM | POA: Diagnosis not present

## 2014-11-28 DIAGNOSIS — H5203 Hypermetropia, bilateral: Secondary | ICD-10-CM | POA: Diagnosis not present

## 2014-12-21 ENCOUNTER — Ambulatory Visit (INDEPENDENT_AMBULATORY_CARE_PROVIDER_SITE_OTHER): Payer: Medicare Other

## 2014-12-21 VITALS — BP 118/60 | Ht 62.0 in | Wt 122.8 lb

## 2014-12-21 DIAGNOSIS — Z0000 Encounter for general adult medical examination without abnormal findings: Secondary | ICD-10-CM

## 2014-12-21 DIAGNOSIS — M8580 Other specified disorders of bone density and structure, unspecified site: Secondary | ICD-10-CM

## 2014-12-21 NOTE — Patient Instructions (Addendum)
Ms. Cid , Thank you for taking time to come for your Medicare Wellness Visit. I appreciate your ongoing commitment to your health goals. Please review the following plan we discussed and let me know if I can assist you in the future.   Will make apt for Dexa scan;  These are the goals we discussed: Goals    . maintain health     Would feel better if she loses weight; weight is normal; May cut back on unhealthy food; hamburger, french fries       This is a list of the screening recommended for you and due dates:  Health Maintenance  Topic Date Due  . Flu Shot  12/12/2014  . Tetanus Vaccine  12/07/2017  . Colon Cancer Screening  03/25/2024  . DEXA scan (bone density measurement)  Completed  . Shingles Vaccine  Completed  . Pneumonia vaccines  Completed     Bone Densitometry Bone densitometry is a special X-ray that measures your bone density and can be used to help predict your risk of bone fractures. This test is used to determine bone mineral content and density to diagnose osteoporosis. Osteoporosis is the loss of bone that may cause the bone to become weak. Osteoporosis commonly occurs in women entering menopause. However, it may be found in men and in people with other diseases. PREPARATION FOR TEST No preparation necessary. WHO SHOULD BE TESTED?  All women older than 67.  Postmenopausal women (50 to 65) with risk factors for osteoporosis.  People with a previous fracture caused by normal activities.  People with a small body frame (less than 127 poundsor a body mass index [BMI] of less than 21).  People who have a parent with a hip fracture or history of osteoporosis.  People who smoke.  People who have rheumatoid arthritis.  Anyone who engages in excessive alcohol use (more than 3 drinks most days).  Women who experience early menopause. WHEN SHOULD YOU BE RETESTED? Current guidelines suggest that you should wait at least 2 years before doing a bone density  test again if your first test was normal.Recent studies indicated that women with normal bone density may be able to wait a few years before needing to repeat a bone density test. You should discuss this with your caregiver.  NORMAL FINDINGS   Normal: less than standard deviation below normal (greater than -1).  Osteopenia: 1 to 2.5 standard deviations below normal (-1 to -2.5).  Osteoporosis: greater than 2.5 standard deviations below normal (less than -2.5). Test results are reported as a "T score" and a "Z score."The T score is a number that compares your bone density with the bone density of healthy, young women.The Z score is a number that compares your bone density with the scores of women who are the same age, gender, and race.  Ranges for normal findings may vary among different laboratories and hospitals. You should always check with your doctor after having lab work or other tests done to discuss the meaning of your test results and whether your values are considered within normal limits. MEANING OF TEST  Your caregiver will go over the test results with you and discuss the importance and meaning of your results, as well as treatment options and the need for additional tests if necessary. OBTAINING THE TEST RESULTS It is your responsibility to obtain your test results. Ask the lab or department performing the test when and how you will get your results. Document Released: 05/21/2004 Document Revised: 07/22/2011  Document Reviewed: 06/13/2010 [facility] Patient Information 2015 [place]. This information is not intended to replace advice given to you by your health care provider. Make sure you discuss any questions you have with your health care provider.  Health Maintenance Adopting a healthy lifestyle and getting preventive care can go a long way to promote health and wellness. Talk with your health care provider about what schedule of regular examinations is right for you. This  is a good chance for you to check in with your provider about disease prevention and staying healthy. In between checkups, there are plenty of things you can do on your own. Experts have done a lot of research about which lifestyle changes and preventive measures are most likely to keep you healthy. Ask your health care provider for more information. WEIGHT AND DIET  Eat a healthy diet  Be sure to include plenty of vegetables, fruits, low-fat dairy products, and lean protein.  Do not eat a lot of foods high in solid fats, added sugars, or salt.  Get regular exercise. This is one of the most important things you can do for your health.  Most adults should exercise for at least 150 minutes each week. The exercise should increase your heart rate and make you sweat (moderate-intensity exercise).  Most adults should also do strengthening exercises at least twice a week. This is in addition to the moderate-intensity exercise.  Maintain a healthy weight  Body mass index (BMI) is a measurement that can be used to identify possible weight problems. It estimates body fat based on height and weight. Your health care provider can help determine your BMI and help you achieve or maintain a healthy weight.  For females 61 years of age and older:   A BMI below 18.5 is considered underweight.  A BMI of 18.5 to 24.9 is normal.  A BMI of 25 to 29.9 is considered overweight.  A BMI of 30 and above is considered obese.  Watch levels of cholesterol and blood lipids  You should start having your blood tested for lipids and cholesterol at 75 years of age, then have this test every 5 years.  You may need to have your cholesterol levels checked more often if:  Your lipid or cholesterol levels are high.  You are older than 75 years of age.  You are at high risk for heart disease.  CANCER SCREENING   Lung Cancer  Lung cancer screening is recommended for adults 23-70 years old who are at high risk  for lung cancer because of a history of smoking.  A yearly low-dose CT scan of the lungs is recommended for people who:  Currently smoke.  Have quit within the past 15 years.  Have at least a 30-pack-year history of smoking. A pack year is smoking an average of one pack of cigarettes a day for 1 year.  Yearly screening should continue until it has been 15 years since you quit.  Yearly screening should stop if you develop a health problem that would prevent you from having lung cancer treatment.  Breast Cancer  Practice breast self-awareness. This means understanding how your breasts normally appear and feel.  It also means doing regular breast self-exams. Let your health care provider know about any changes, no matter how small.  If you are in your 20s or 30s, you should have a clinical breast exam (CBE) by a health care provider every 1-3 years as part of a regular health exam.  If you are  36 or older, have a CBE every year. Also consider having a breast X-ray (mammogram) every year.  If you have a family history of breast cancer, talk to your health care provider about genetic screening.  If you are at high risk for breast cancer, talk to your health care provider about having an MRI and a mammogram every year.  Breast cancer gene (BRCA) assessment is recommended for women who have family members with BRCA-related cancers. BRCA-related cancers include:  Breast.  Ovarian.  Tubal.  Peritoneal cancers.  Results of the assessment will determine the need for genetic counseling and BRCA1 and BRCA2 testing. Cervical Cancer Routine pelvic examinations to screen for cervical cancer are no longer recommended for nonpregnant women who are considered low risk for cancer of the pelvic organs (ovaries, uterus, and vagina) and who do not have symptoms. A pelvic examination may be necessary if you have symptoms including those associated with pelvic infections. Ask your health care provider  if a screening pelvic exam is right for you.   The Pap test is the screening test for cervical cancer for women who are considered at risk.  If you had a hysterectomy for a problem that was not cancer or a condition that could lead to cancer, then you no longer need Pap tests.  If you are older than 65 years, and you have had normal Pap tests for the past 10 years, you no longer need to have Pap tests.  If you have had past treatment for cervical cancer or a condition that could lead to cancer, you need Pap tests and screening for cancer for at least 20 years after your treatment.  If you no longer get a Pap test, assess your risk factors if they change (such as having a new sexual partner). This can affect whether you should start being screened again.  Some women have medical problems that increase their chance of getting cervical cancer. If this is the case for you, your health care provider may recommend more frequent screening and Pap tests.  The human papillomavirus (HPV) test is another test that may be used for cervical cancer screening. The HPV test looks for the virus that can cause cell changes in the cervix. The cells collected during the Pap test can be tested for HPV.  The HPV test can be used to screen women 17 years of age and older. Getting tested for HPV can extend the interval between normal Pap tests from three to five years.  An HPV test also should be used to screen women of any age who have unclear Pap test results.  After 75 years of age, women should have HPV testing as often as Pap tests.  Colorectal Cancer  This type of cancer can be detected and often prevented.  Routine colorectal cancer screening usually begins at 75 years of age and continues through 75 years of age.  Your health care provider may recommend screening at an earlier age if you have risk factors for colon cancer.  Your health care provider may also recommend using home test kits to check for  hidden blood in the stool.  A small camera at the end of a tube can be used to examine your colon directly (sigmoidoscopy or colonoscopy). This is done to check for the earliest forms of colorectal cancer.  Routine screening usually begins at age 65.  Direct examination of the colon should be repeated every 5-10 years through 75 years of age. However, you may need  to be screened more often if early forms of precancerous polyps or small growths are found. Skin Cancer  Check your skin from head to toe regularly.  Tell your health care provider about any new moles or changes in moles, especially if there is a change in a mole's shape or color.  Also tell your health care provider if you have a mole that is larger than the size of a pencil eraser.  Always use sunscreen. Apply sunscreen liberally and repeatedly throughout the day.  Protect yourself by wearing long sleeves, pants, a wide-brimmed hat, and sunglasses whenever you are outside. HEART DISEASE, DIABETES, AND HIGH BLOOD PRESSURE   Have your blood pressure checked at least every 1-2 years. High blood pressure causes heart disease and increases the risk of stroke.  If you are between 69 years and 2 years old, ask your health care provider if you should take aspirin to prevent strokes.  Have regular diabetes screenings. This involves taking a blood sample to check your fasting blood sugar level.  If you are at a normal weight and have a low risk for diabetes, have this test once every three years after 75 years of age.  If you are overweight and have a high risk for diabetes, consider being tested at a younger age or more often. PREVENTING INFECTION  Hepatitis B  If you have a higher risk for hepatitis B, you should be screened for this virus. You are considered at high risk for hepatitis B if:  You were born in a country where hepatitis B is common. Ask your health care provider which countries are considered high risk.  Your  parents were born in a high-risk country, and you have not been immunized against hepatitis B (hepatitis B vaccine).  You have HIV or AIDS.  You use needles to inject street drugs.  You live with someone who has hepatitis B.  You have had sex with someone who has hepatitis B.  You get hemodialysis treatment.  You take certain medicines for conditions, including cancer, organ transplantation, and autoimmune conditions. Hepatitis C  Blood testing is recommended for:  Everyone born from [birth year] through 1966.  Anyone with known risk factors for hepatitis C. Sexually transmitted infections (STIs)  You should be screened for sexually transmitted infections (STIs) including gonorrhea and chlamydia if:  You are sexually active and are younger than 75 years of age.  You are older than 75 years of age and your health care provider tells you that you are at risk for this type of infection.  Your sexual activity has changed since you were last screened and you are at an increased risk for chlamydia or gonorrhea. Ask your health care provider if you are at risk.  If you do not have HIV, but are at risk, it may be recommended that you take a prescription medicine daily to prevent HIV infection. This is called pre-exposure prophylaxis (PrEP). You are considered at risk if:  You are sexually active and do not regularly use condoms or know the HIV status of your partner(s).  You take drugs by injection.  You are sexually active with a partner who has HIV. Talk with your health care provider about whether you are at high risk of being infected with HIV. If you choose to begin PrEP, you should first be tested for HIV. You should then be tested every 3 months for as long as you are taking PrEP.  PREGNANCY   If you are premenopausal and  you may become pregnant, ask your health care provider about preconception counseling.  If you may become pregnant, take 400 to 800 micrograms (mcg) of folic acid  every day.  If you want to prevent pregnancy, talk to your health care provider about birth control (contraception). OSTEOPOROSIS AND MENOPAUSE   Osteoporosis is a disease in which the bones lose minerals and strength with aging. This can result in serious bone fractures. Your risk for osteoporosis can be identified using a bone density scan.  If you are 60 years of age or older, or if you are at risk for osteoporosis and fractures, ask your health care provider if you should be screened.  Ask your health care provider whether you should take a calcium or vitamin D supplement to lower your risk for osteoporosis.  Menopause may have certain physical symptoms and risks.  Hormone replacement therapy may reduce some of these symptoms and risks. Talk to your health care provider about whether hormone replacement therapy is right for you.  HOME CARE INSTRUCTIONS   Schedule regular health, dental, and eye exams.  Stay current with your immunizations.   Do not use any tobacco products including cigarettes, chewing tobacco, or electronic cigarettes.  If you are pregnant, do not drink alcohol.  If you are breastfeeding, limit how much and how often you drink alcohol.  Limit alcohol intake to no more than 1 drink per day for nonpregnant women. One drink equals 12 ounces of beer, 5 ounces of wine, or 1 ounces of hard liquor.  Do not use street drugs.  Do not share needles.  Ask your health care provider for help if you need support or information about quitting drugs.  Tell your health care provider if you often feel depressed.  Tell your health care provider if you have ever been abused or do not feel safe at home. Document Released: 11/12/2010 Document Revised: 09/13/2013 Document Reviewed: 03/31/2013 [facility] Patient Information 2015 [place], [place]. This information is not intended to replace advice given to you by your health care provider. Make sure you discuss any questions you  have with your health care provider.

## 2014-12-21 NOTE — Progress Notes (Addendum)
Subjective:   Karla Roman is a 74 y.o. female who presents for an Initial Medicare Annual Wellness Visit.  Review of Systems     HRA assessment completed during visit;  Patient is here for Annual Wellness Assessment The Patient was informed that this wellness visit is to identify risk and educate on how to reduce risk for increase disease through lifestyle changes.  The patient verbalized understanding that any voiced medical issues still need to be directed to their physician.    The goal of the wellness visit is to assist the patient how to close the gaps in care and create a preventative care plan for the patient.    Problem list reviewed and are being managed medically;  Will educate for lifestyle changes as appropriate for hyperlipidemia;  Lipids cho 232; HDL 99; trig 55; LDL 121; ratio 2/   Has mitral valve prolapse; one echo; no issue identified Hiatal hernia controlled with med and no symptoms; no diet issue  Family hx CAD in mother and father; MI father; Brother DM  Diet; BMI 22.3; but feels like she is overweight;  One year ago 106; had flu; pneumonia; fluid in lungs; bleeding ulcer and was in the hospital x2 with illness lasting from Jan -March;    Exercise; low aerobic at home 1.5 hour per day; some weights; movement;  Stays motivated;   Stressors include;  3 grandchildren; 2 twin boys 6 and the girl is 86 yo and keeps them during the day; Will start keeping them in the afternoon  Was getting up at 5am to accomplish this.  Sister has been diagnosed with dementia; she is 23; In nursing home in [place]; Is a caregiver to her;  Spouse has rectal cancer; had this x 2 year;   Personalized education given regarding risk The patient voiced concern over and set a related goal.  Psychosocial support; one level home; bonus room upstairs; lives in townhome; safe community; firearm safety; smoke alarms;    Screenings are not overdue in Epic Awaiting flu vaccine  for this year Ophthalmology exam; once a year; just had exam; Dr. Gershon Crane small cataracts starting Immunizations; pneumonia series completed Tetanus  Shingles/ May 2007 Colonoscopy; 03/25/2014/ no more colonoscopies based on age per Dr. Olevia Perches  Mod diverticulosis/ no issue EKG 05/2013; due July 2019 Dexa scan; 2012 t score -1.7' / did take BP med but (not sure which one); for several years/ Will repeat to check osteopenia Mammogram 07/2014  no evidence of malignancy;  Hearing: 4000hz  both ears Dental: 2 times per day;   Gave information on safety to take home;   Current Care Team reviewed and updated   Cardiac Risk Factors include: advanced age (>66men, >23 women);dyslipidemia     Objective:    Today's Vitals   12/21/14 1410  BP: 118/60  Height: 5\' 2"  (1.575 m)  Weight: 122 lb 12 oz (55.679 kg)    Current Medications (verified) Outpatient Encounter Prescriptions as of 12/21/2014  Medication Sig  . acetaminophen (TYLENOL) 500 MG tablet Take 500-1,000 mg by mouth every 6 (six) hours as needed for mild pain or headache.  . Biotin 300 MCG TABS Take by mouth daily.  . Calcium Carbonate (CALTRATE 600) 1500 MG TABS Take 1 tablet by mouth 2 (two) times daily. chewables  . Cholecalciferol (VITAMIN D-3 PO) Take 400 Units by mouth daily.  . cyanocobalamin 500 MCG tablet Take 500 mcg by mouth every morning.  Marland Kitchen glucosamine-chondroitin 500-400 MG tablet Take 1 tablet by  mouth 2 (two) times daily.  . Multiple Vitamins-Minerals (MULTIVITAMIN WITH MINERALS) tablet Take 1 tablet by mouth every morning. chewables  . omega-3 acid ethyl esters (LOVAZA) 1 G capsule Take 1 g by mouth every morning.  . pantoprazole (PROTONIX) 40 MG tablet Take 1 tablet daily before  breakfast  . Psyllium (METAMUCIL PO) Take by mouth daily.  Marland Kitchen saccharomyces boulardii (FLORASTOR) 250 MG capsule Take 1 capsule (250 mg total) by mouth 2 (two) times daily.  . vitamin C (ASCORBIC ACID) 250 MG tablet Take 250 mg by mouth  2 (two) times daily.  . vitamin E 400 UNIT capsule Take 400 Units by mouth every morning.   . cetirizine (ZYRTEC) 10 MG tablet Take 1 tablet (10 mg total) by mouth daily. (Patient not taking: Reported on 12/21/2014)   No facility-administered encounter medications on file as of 12/21/2014.    Allergies (verified) Codeine and Levaquin   History: Past Medical History  Diagnosis Date  . Diverticulitis   . GERD (gastroesophageal reflux disease)   . Atrophic vaginitis   . MVP (mitral valve prolapse)   . Vertigo   . Osteopenia   . Eustachian tube dysfunction   . Anemia     in childhood  . Hiatal hernia   . Dyspareunia   . Diverticulosis   . Hx of adenomatous colonic polyps 02/2009    Tubular adenoma in sigmoid. Diminutive.   . Schatzki's ring 1995  . Allergic rhinitis   . Dysplastic nevus 09/2012, 10/2012    right thigh, severe atypia, resected 10/2012 by Dr Jarome Matin  . Duodenal ulcer   . Esophagitis     grade 1  . Sleep apnea 10/2004    Sleep study by Dr Gwenette Greet. does not need cpap  . Allergy   . Arthritis   . Cataract    Past Surgical History  Procedure Laterality Date  . Appendectomy  1989    ruptured  . Abdominal hysterectomy  2001    with anterior and posterior vaginal repair  . Tonsillectomy  1947  . Breast biopsy Right 1969    benign  . Septoplasty  1976  . Cervical discectomy  01/2004    C3-4, C5-6, C6-7  . Laparoscopy  04/2000    with lysis of adhesions  . Shoulder surgery Left 01/2004    release of adhesive capsulitis  . Depuytren's contraction Right 04/2006  . Dysplastic nevus  10/2012    right thigh  . Right shoulder fracture Right 03/2013    non-displaced, humeral head, non-operative mgt but she developed frozen shoulder.   . Esophagogastroduodenoscopy N/A 06/04/2013    Procedure: ESOPHAGOGASTRODUODENOSCOPY (EGD);  Surgeon: Lafayette Dragon, MD;  Location: [facility] ENDOSCOPY;  Service: Endoscopy;  Laterality: N/A;   Family History  Problem Relation Age of Onset    . Heart attack Father     after years of angina  . Coronary artery disease Father   . Coronary artery disease Mother   . Breast cancer Maternal Aunt   . Cancer Maternal Aunt     breast, colon, ovarian  . Diabetes Brother   . Coronary artery disease Brother   . Hypertension Sister   . Hyperthyroidism Sister   . Irritable bowel syndrome Sister   . Coronary artery disease Brother     PCI-stents  . Colon cancer Neg Hx   . Diabetes Brother   . Hypertension Brother    Social History   Occupational History  . Retired from [employer]  Social History Main Topics  . Smoking status: Never Smoker   . Smokeless tobacco: Never Used  . Alcohol Use: No  . Drug Use: No  . Sexual Activity: Not Currently    Tobacco Counseling Counseling given: Yes   Activities of Daily Living In your present state of health, do you have any difficulty performing the following activities: 12/21/2014  Hearing? N  Vision? N  Difficulty concentrating or making decisions? N  Walking or climbing stairs? N  Dressing or bathing? N  Doing errands, shopping? N  Preparing Food and eating ? N  Using the Toilet? N  In the past six months, have you accidently leaked urine? N  Do you have problems with loss of bowel control? N  Managing your Medications? N  Managing your Finances? N  Housekeeping or managing your Housekeeping? N    Immunizations and Health Maintenance Immunization History  Administered Date(s) Administered  . Influenza Split 02/20/2012  . Influenza Whole 05/15/1998, 02/26/2008, 02/08/2009, 02/16/2010  . Influenza,inj,Quad PF,36+ Mos 02/10/2013, 02/11/2014  . Pneumococcal Conjugate-13 02/26/2008, 02/11/2014  . Pneumococcal Polysaccharide-23 02/11/2003, 12/13/2010  . Td 12/08/2007  . Zoster 09/23/2005   Health Maintenance Due  Topic Date Due  . INFLUENZA VACCINE  12/12/2014    Patient Care Team: Janith Lima, MD as PCP - General (Internal Medicine) Lafayette Dragon, MD  (Gastroenterology) Stark Klein, MD as Consulting Physician (General Surgery) Jarome Matin, MD as Consulting Physician (Dermatology)  Indicate any recent Medical Services you may have received from other than [facility] providers in the past year (date may be approximate).     Assessment:   This is a routine wellness examination for Blanche.    Pt determined a personalized goal; see patient goals;   Assessment included:  Bone density scan as appropriate Calcium and Vit D as appropriate/ Osteoporosis risk reviewed Osteopenic and last dexa was in 2012; ordered for day of CPE  Taking meds without issues; no barriers identified  Labs were and fup visit noted with MD if labs are due to be re-drawn.  Stress: Recommendations for managing stress if assessed as a factor;   No risk for hepatitis identified  Safety issues reviewed; Lives in town home; safe envioroment  Cognition assessed by AD8; Score 0 MMSE deferred as the patient stated they had no memory issues; No identified risk were noted; The patient was oriented x 3; appropriate in dress and manner and no objective failures at ADL's or IADL's.   Depression screen negative;  There are stressors including sister 28 years older in SNF with dementia; Keeps grand-children and spouse has had surgery in last 2 years with colostomy;  Educated regarding dementia and fear of this happening to her;   Functional status reviewed; no losses in function x 1 year  No Bowel and bladder incontinence   End of life planning was discussed;LW in place    Hearing/Vision screen  Hearing Screening   125Hz  250Hz  500Hz  1000Hz  2000Hz  4000Hz  8000Hz   Right ear:      100   Left ear:      100     Dietary issues and exercise activities discussed: Current Exercise Habits:: Home exercise routine, Time (Minutes): 60, Frequency (Times/Week): 5, Weekly Exercise (Minutes/Week): 300, Intensity: Moderate  Goals    . maintain health     Would feel better if she  loses weight; weight is normal; May cut back on unhealthy food; hamburger, french fries      Depression Screen PHQ 2/9 Scores 12/21/2014  11/10/2014 12/28/2013  PHQ - 2 Score 0 0 0    Fall Risk Fall Risk  12/21/2014 11/10/2014 12/28/2013  Falls in the past year? No No No    Cognitive Function: No flowsheet data found.  Screening Tests Health Maintenance  Topic Date Due  . INFLUENZA VACCINE  12/12/2014  . TETANUS/TDAP  12/07/2017  . COLONOSCOPY  03/25/2024  . DEXA SCAN  Completed  . ZOSTAVAX  Completed  . PNA vac Low Risk Adult  Completed      Plan:   AWV completed; stays up to date on screens; Will schedule Dexa scan for date of CPE next week;   During the course of the visit, Jalena was educated and counseled about the following appropriate screening and preventive services:   Vaccines to include Pneumoccal, Influenza, Hepatitis B, Td, Zostavax, HCV/ up to date; awaiting flu shot  Electrocardiogram; 05/2013  Cardiovascular disease screening; no issues  Colorectal cancer screening; no more necessary  Bone density screening  Diabetes screening  Glaucoma screening  Mammography/PAP  Nutrition counseling  Smoking cessation counseling  Patient Instructions (the written plan) were given to the patient.    EXNTZ,GYFVC, RN   12/21/2014      Medical screening examination/treatment/procedure(s) were performed by non-physician practitioner and as supervising physician I was immediately available for consultation/collaboration. I agree with above. Scarlette Calico, MD

## 2014-12-29 ENCOUNTER — Other Ambulatory Visit (INDEPENDENT_AMBULATORY_CARE_PROVIDER_SITE_OTHER): Payer: Medicare Other

## 2014-12-29 ENCOUNTER — Encounter: Payer: Self-pay | Admitting: Internal Medicine

## 2014-12-29 ENCOUNTER — Ambulatory Visit (INDEPENDENT_AMBULATORY_CARE_PROVIDER_SITE_OTHER)
Admission: RE | Admit: 2014-12-29 | Discharge: 2014-12-29 | Disposition: A | Discharge status: Home / Self Care | Payer: Medicare Other | Source: Ambulatory Visit | Attending: Internal Medicine | Admitting: Internal Medicine

## 2014-12-29 ENCOUNTER — Ambulatory Visit (INDEPENDENT_AMBULATORY_CARE_PROVIDER_SITE_OTHER): Payer: Medicare Other | Admitting: Internal Medicine

## 2014-12-29 VITALS — BP 128/62 | HR 67 | Temp 97.9°F | Resp 16 | Ht 62.0 in | Wt 122.0 lb

## 2014-12-29 DIAGNOSIS — M8580 Other specified disorders of bone density and structure, unspecified site: Secondary | ICD-10-CM

## 2014-12-29 DIAGNOSIS — E785 Hyperlipidemia, unspecified: Secondary | ICD-10-CM

## 2014-12-29 DIAGNOSIS — M8589 Other specified disorders of bone density and structure, multiple sites: Secondary | ICD-10-CM

## 2014-12-29 DIAGNOSIS — Z0000 Encounter for general adult medical examination without abnormal findings: Secondary | ICD-10-CM | POA: Diagnosis not present

## 2014-12-29 DIAGNOSIS — H9313 Tinnitus, bilateral: Secondary | ICD-10-CM

## 2014-12-29 DIAGNOSIS — H9319 Tinnitus, unspecified ear: Secondary | ICD-10-CM | POA: Insufficient documentation

## 2014-12-29 LAB — CBC WITH DIFFERENTIAL/PLATELET
Basophils Absolute: 0 10*3/uL (ref 0.0–0.1)
Basophils Relative: 0.4 % (ref 0.0–3.0)
Eosinophils Absolute: 0.1 10*3/uL (ref 0.0–0.7)
Eosinophils Relative: 1.5 % (ref 0.0–5.0)
HCT: 38.6 % (ref 36.0–46.0)
Hemoglobin: 12.9 g/dL (ref 12.0–15.0)
Lymphocytes Relative: 22.9 % (ref 12.0–46.0)
Lymphs Abs: 1.7 10*3/uL (ref 0.7–4.0)
MCHC: 33.4 g/dL (ref 30.0–36.0)
MCV: 91.9 fl (ref 78.0–100.0)
Monocytes Absolute: 0.6 10*3/uL (ref 0.1–1.0)
Monocytes Relative: 8 % (ref 3.0–12.0)
Neutro Abs: 5 10*3/uL (ref 1.4–7.7)
Neutrophils Relative %: 67.2 % (ref 43.0–77.0)
Platelets: 215 10*3/uL (ref 150.0–400.0)
RBC: 4.21 Mil/uL (ref 3.87–5.11)
RDW: 13.3 % (ref 11.5–15.5)
WBC: 7.5 10*3/uL (ref 4.0–10.5)

## 2014-12-29 LAB — LIPID PANEL
Cholesterol: 227 mg/dL — ABNORMAL HIGH (ref 0–200)
HDL: 83.7 mg/dL (ref >39.00)
LDL Cholesterol: 131 mg/dL — ABNORMAL HIGH (ref 0–99)
NonHDL: 143.49
Total CHOL/HDL Ratio: 3
Triglycerides: 64 mg/dL (ref 0.0–149.0)
VLDL: 12.8 mg/dL (ref 0.0–40.0)

## 2014-12-29 LAB — COMPREHENSIVE METABOLIC PANEL
ALT: 15 U/L (ref 0–35)
AST: 21 U/L (ref 0–37)
Albumin: 4.4 g/dL (ref 3.5–5.2)
Alkaline Phosphatase: 88 U/L (ref 39–117)
BUN: 14 mg/dL (ref 6–23)
CO2: 31 mEq/L (ref 19–32)
Calcium: 9.5 mg/dL (ref 8.4–10.5)
Chloride: 104 mEq/L (ref 96–112)
Creatinine, Ser: 0.79 mg/dL (ref 0.40–1.20)
GFR: 75.28 mL/min (ref >60.00)
Glucose, Bld: 100 mg/dL — ABNORMAL HIGH (ref 70–99)
Potassium: 4.1 mEq/L (ref 3.5–5.1)
Sodium: 140 mEq/L (ref 135–145)
Total Bilirubin: 0.8 mg/dL (ref 0.2–1.2)
Total Protein: 7.1 g/dL (ref 6.0–8.3)

## 2014-12-29 LAB — TSH: TSH: 1.27 u[IU]/mL (ref 0.35–4.50)

## 2014-12-29 NOTE — Progress Notes (Signed)
Pre visit review using our clinic review tool, if applicable. No additional management support is needed unless otherwise documented below in the visit note. 

## 2014-12-29 NOTE — Progress Notes (Signed)
Subjective:  Patient ID: Karla Roman, female    DOB: [birth year]  Age: 74 y.o. MRN: [id number]  CC: Hyperlipidemia and Annual Exam   HPI Karla Roman presents for a physical and cholesterol check. She complains of chronic ringing in the ears for more than 20 years. She wants to know if something can be done about this. She offers no other complaints.  Outpatient Prescriptions Prior to Visit  Medication Sig Dispense Refill  . acetaminophen (TYLENOL) 500 MG tablet Take 500-1,000 mg by mouth every 6 (six) hours as needed for mild pain or headache.    . Biotin 300 MCG TABS Take by mouth daily.    . Calcium Carbonate (CALTRATE 600) 1500 MG TABS Take 1 tablet by mouth 2 (two) times daily. chewables    . cetirizine (ZYRTEC) 10 MG tablet Take 1 tablet (10 mg total) by mouth daily. 30 tablet 11  . Cholecalciferol (VITAMIN D-3 PO) Take 400 Units by mouth daily.    . cyanocobalamin 500 MCG tablet Take 500 mcg by mouth every morning.    Marland Kitchen glucosamine-chondroitin 500-400 MG tablet Take 1 tablet by mouth 2 (two) times daily.    . Multiple Vitamins-Minerals (MULTIVITAMIN WITH MINERALS) tablet Take 1 tablet by mouth every morning. chewables    . omega-3 acid ethyl esters (LOVAZA) 1 G capsule Take 1 g by mouth every morning.    . pantoprazole (PROTONIX) 40 MG tablet Take 1 tablet daily before  breakfast 90 tablet 3  . Psyllium (METAMUCIL PO) Take by mouth daily.    Marland Kitchen saccharomyces boulardii (FLORASTOR) 250 MG capsule Take 1 capsule (250 mg total) by mouth 2 (two) times daily. 60 capsule 0  . vitamin C (ASCORBIC ACID) 250 MG tablet Take 250 mg by mouth 2 (two) times daily.    . vitamin E 400 UNIT capsule Take 400 Units by mouth every morning.      No facility-administered medications prior to visit.    ROS Review of Systems  Constitutional: Negative.  Negative for fever, chills, diaphoresis, appetite change and fatigue.  HENT: Positive for tinnitus. Negative for congestion, ear pain,  hearing loss, sore throat, trouble swallowing and voice change.   Eyes: Negative.   Respiratory: Negative.  Negative for cough, choking, chest tightness, shortness of breath and stridor.   Cardiovascular: Negative.  Negative for chest pain, palpitations and leg swelling.  Gastrointestinal: Negative.  Negative for nausea, vomiting, abdominal pain, diarrhea, constipation and blood in stool.  Endocrine: Negative.   Genitourinary: Negative.  Negative for frequency, flank pain, decreased urine volume, vaginal bleeding, vaginal discharge, difficulty urinating and vaginal pain.  Musculoskeletal: Negative.  Negative for myalgias, back pain, joint swelling and arthralgias.  Skin: Negative.  Negative for rash.  Allergic/Immunologic: Negative.   Neurological: Negative.   Hematological: Negative.  Negative for adenopathy. Does not bruise/bleed easily.  Psychiatric/Behavioral: Negative.     Objective:  BP 128/62 mmHg  Pulse 67  Temp(Src) 97.9 F (36.6 C) (Oral)  Resp 16  Ht 5\' 2"  (1.575 m)  Wt 122 lb (55.339 kg)  BMI 22.31 kg/m2  SpO2 96%  BP Readings from Last 3 Encounters:  12/29/14 128/62  12/21/14 118/60  11/10/14 136/68    Wt Readings from Last 3 Encounters:  12/29/14 122 lb (55.339 kg)  12/21/14 122 lb 12 oz (55.679 kg)  11/10/14 120 lb 4 oz (54.545 kg)    Physical Exam  Constitutional: She is oriented to person, place, and time. She appears well-developed and well-nourished. No  distress.  HENT:  Head: Normocephalic and atraumatic.  Right Ear: Hearing, tympanic membrane, external ear and ear canal normal.  Left Ear: Hearing, tympanic membrane, external ear and ear canal normal.  Mouth/Throat: Oropharynx is clear and moist and mucous membranes are normal. Mucous membranes are not pale, not dry and not cyanotic. No oral lesions. No trismus in the jaw. No uvula swelling. No oropharyngeal exudate, posterior oropharyngeal edema, posterior oropharyngeal erythema or tonsillar abscesses.   Eyes: Conjunctivae are normal. Right eye exhibits no discharge. Left eye exhibits no discharge. No scleral icterus.  Neck: Normal range of motion. Neck supple. No JVD present. No tracheal deviation present. No thyromegaly present.  Cardiovascular: Normal rate, regular rhythm, normal heart sounds and intact distal pulses.  Exam reveals no gallop and no friction rub.   No murmur heard. Pulmonary/Chest: Effort normal and breath sounds normal. No stridor. No respiratory distress. She has no wheezes. She has no rales. She exhibits no tenderness.  Abdominal: Soft. Bowel sounds are normal. She exhibits no distension and no mass. There is no tenderness. There is no rebound and no guarding. Hernia confirmed negative in the right inguinal area and confirmed negative in the left inguinal area.  Genitourinary: Rectum normal. Rectal exam shows no external hemorrhoid, no internal hemorrhoid, no fissure, no mass, no tenderness and anal tone normal. Guaiac negative stool. No breast swelling, tenderness, discharge or bleeding. Pelvic exam was performed with patient supine. No labial fusion. There is no rash, tenderness, lesion or injury on the right labia. There is no rash, tenderness, lesion or injury on the left labia. Cervix exhibits no discharge. Right adnexum displays no tenderness and no fullness. Left adnexum displays no mass, no tenderness and no fullness. No erythema, tenderness or bleeding in the vagina. No foreign body around the vagina. No signs of injury around the vagina. No vaginal discharge found.  Musculoskeletal: Normal range of motion. She exhibits no edema or tenderness.  Lymphadenopathy:    She has no cervical adenopathy.       Right: No inguinal adenopathy present.       Left: No inguinal adenopathy present.  Neurological: She is oriented to person, place, and time.  Skin: Skin is warm and dry. No rash noted. She is not diaphoretic. No erythema. No pallor.  Psychiatric: She has a normal mood and  affect. Her behavior is normal. Judgment and thought content normal.  Vitals reviewed.   Lab Results  Component Value Date   WBC 7.5 12/29/2014   HGB 12.9 12/29/2014   HCT 38.6 12/29/2014   PLT 215.0 12/29/2014   GLUCOSE 100* 12/29/2014   CHOL 227* 12/29/2014   TRIG 64.0 12/29/2014   HDL 83.70 12/29/2014   LDLDIRECT 115.1 12/23/2012   LDLCALC 131* 12/29/2014   ALT 15 12/29/2014   AST 21 12/29/2014   NA 140 12/29/2014   K 4.1 12/29/2014   CL 104 12/29/2014   CREATININE 0.79 12/29/2014   BUN 14 12/29/2014   CO2 31 12/29/2014   TSH 1.27 12/29/2014    Dg Cervical Spine Complete  10/11/2014   CLINICAL DATA:  A remnant mid and right posterior neck pain over the past 3 weeks without reported injury. History of previous anterior fusion from C4 through C7.  EXAM: CERVICAL SPINE  4+ VIEWS  COMPARISON:  None.  FINDINGS: The cervical vertebral bodies are preserved in height. There is fusion across the C4-5, C5-6, and C6-7 disc spaces. The metallic fusion hardware is intact. C1, C2, and C3 exhibit no  acute abnormalities. There is mild anterolisthesis of C7 with respect to T1. There is no significant disc space narrowing. The spinous processes are intact. There is mild bony encroachment upon the C3 neural foramen on the left due to facet joint and uncovertebral joint osteophyte. The odontoid is intact.  IMPRESSION: 1. The patient has undergone anterior fusion from C4 through C7. The fusion hardware is intact. There is mild disc space narrowing at C7-T1 with mild anterolisthesis of C7 with respect to T1 2. There is mild bony encroachment upon the C3 neural foramen on the left.   Electronically Signed   By: David  Martinique M.D.   On: 10/11/2014 14:31    Assessment & Plan:   Mashanda was seen today for hyperlipidemia and annual exam.  Diagnoses and all orders for this visit:  Hyperlipidemia with target LDL less than 130- her Framingham risk score is only 5% so I do not recommend that she start a  statin. -     Lipid panel; Future -     CBC with Differential/Platelet; Future -     Comprehensive metabolic panel; Future -     TSH; Future  Tinnitus, bilateral- I have referred her to audiology to see if there are any interventions will help with this. -     Ambulatory referral to Audiology  Routine general medical examination at a health care facility   I am having Ms. Lowe maintain her Calcium Carbonate, multivitamin with minerals, vitamin E, acetaminophen, cyanocobalamin, omega-3 acid ethyl esters, saccharomyces boulardii, Psyllium (METAMUCIL PO), Biotin, vitamin C, glucosamine-chondroitin, Cholecalciferol (VITAMIN D-3 PO), cetirizine, pantoprazole, and amoxicillin.  Meds ordered this encounter  Medications  . amoxicillin (AMOXIL) 400 MG/5ML suspension    Sig:    See AVS for instructions about healthy living and anticipatory guidance.   Follow-up: Return in about 1 year (around 12/29/2015).  Scarlette Calico, MD

## 2014-12-29 NOTE — Patient Instructions (Signed)
Preventive Care for Adults A healthy lifestyle and preventive care can promote health and wellness. Preventive health guidelines for women include the following key practices.  A routine yearly physical is a good way to check with your health care provider about your health and preventive screening. It is a chance to share any concerns and updates on your health and to receive a thorough exam.  Visit your dentist for a routine exam and preventive care every 6 months. Brush your teeth twice a day and floss once a day. Good oral hygiene prevents tooth decay and gum disease.  The frequency of eye exams is based on your age, health, family medical history, use of contact lenses, and other factors. Follow your health care provider's recommendations for frequency of eye exams.  Eat a healthy diet. Foods like vegetables, fruits, whole grains, low-fat dairy products, and lean protein foods contain the nutrients you need without too many calories. Decrease your intake of foods high in solid fats, added sugars, and salt. Eat the right amount of calories for you.Get information about a proper diet from your health care provider, if necessary.  Regular physical exercise is one of the most important things you can do for your health. Most adults should get at least 150 minutes of moderate-intensity exercise (any activity that increases your heart rate and causes you to sweat) each week. In addition, most adults need muscle-strengthening exercises on 2 or more days a week.  Maintain a healthy weight. The body mass index (BMI) is a screening tool to identify possible weight problems. It provides an estimate of body fat based on height and weight. Your health care provider can find your BMI and can help you achieve or maintain a healthy weight.For adults 20 years and older:  A BMI below 18.5 is considered underweight.  A BMI of 18.5 to 24.9 is normal.  A BMI of 25 to 29.9 is considered overweight.  A BMI of  30 and above is considered obese.  Maintain normal blood lipids and cholesterol levels by exercising and minimizing your intake of saturated fat. Eat a balanced diet with plenty of fruit and vegetables. Blood tests for lipids and cholesterol should begin at age 76 and be repeated every 5 years. If your lipid or cholesterol levels are high, you are over 50, or you are at high risk for heart disease, you may need your cholesterol levels checked more frequently.Ongoing high lipid and cholesterol levels should be treated with medicines if diet and exercise are not working.  If you smoke, find out from your health care provider how to quit. If you do not use tobacco, do not start.  Lung cancer screening is recommended for adults aged 22-80 years who are at high risk for developing lung cancer because of a history of smoking. A yearly low-dose CT scan of the lungs is recommended for people who have at least a 30-pack-year history of smoking and are a current smoker or have quit within the past 15 years. A pack year of smoking is smoking an average of 1 pack of cigarettes a day for 1 year (for example: 1 pack a day for 30 years or 2 packs a day for 15 years). Yearly screening should continue until the smoker has stopped smoking for at least 15 years. Yearly screening should be stopped for people who develop a health problem that would prevent them from having lung cancer treatment.  If you are pregnant, do not drink alcohol. If you are breastfeeding,  be very cautious about drinking alcohol. If you are not pregnant and choose to drink alcohol, do not have more than 1 drink per day. One drink is considered to be 12 ounces (355 mL) of beer, 5 ounces (148 mL) of wine, or 1.5 ounces (44 mL) of liquor.  Avoid use of street drugs. Do not share needles with anyone. Ask for help if you need support or instructions about stopping the use of drugs.  High blood pressure causes heart disease and increases the risk of  stroke. Your blood pressure should be checked at least every 1 to 2 years. Ongoing high blood pressure should be treated with medicines if weight loss and exercise do not work.  If you are 3-86 years old, ask your health care provider if you should take aspirin to prevent strokes.  Diabetes screening involves taking a blood sample to check your fasting blood sugar level. This should be done once every 3 years, after age 67, if you are within normal weight and without risk factors for diabetes. Testing should be considered at a younger age or be carried out more frequently if you are overweight and have at least 1 risk factor for diabetes.  Breast cancer screening is essential preventive care for women. You should practice "breast self-awareness." This means understanding the normal appearance and feel of your breasts and may include breast self-examination. Any changes detected, no matter how small, should be reported to a health care provider. Women in their 8s and 30s should have a clinical breast exam (CBE) by a health care provider as part of a regular health exam every 1 to 3 years. After age 70, women should have a CBE every year. Starting at age 25, women should consider having a mammogram (breast X-ray test) every year. Women who have a family history of breast cancer should talk to their health care provider about genetic screening. Women at a high risk of breast cancer should talk to their health care providers about having an MRI and a mammogram every year.  Breast cancer gene (BRCA)-related cancer risk assessment is recommended for women who have family members with BRCA-related cancers. BRCA-related cancers include breast, ovarian, tubal, and peritoneal cancers. Having family members with these cancers may be associated with an increased risk for harmful changes (mutations) in the breast cancer genes BRCA1 and BRCA2. Results of the assessment will determine the need for genetic counseling and  BRCA1 and BRCA2 testing.  Routine pelvic exams to screen for cancer are no longer recommended for nonpregnant women who are considered low risk for cancer of the pelvic organs (ovaries, uterus, and vagina) and who do not have symptoms. Ask your health care provider if a screening pelvic exam is right for you.  If you have had past treatment for cervical cancer or a condition that could lead to cancer, you need Pap tests and screening for cancer for at least 20 years after your treatment. If Pap tests have been discontinued, your risk factors (such as having a new sexual partner) need to be reassessed to determine if screening should be resumed. Some women have medical problems that increase the chance of getting cervical cancer. In these cases, your health care provider may recommend more frequent screening and Pap tests.  The HPV test is an additional test that may be used for cervical cancer screening. The HPV test looks for the virus that can cause the cell changes on the cervix. The cells collected during the Pap test can be  tested for HPV. The HPV test could be used to screen women aged 30 years and older, and should be used in women of any age who have unclear Pap test results. After the age of 30, women should have HPV testing at the same frequency as a Pap test.  Colorectal cancer can be detected and often prevented. Most routine colorectal cancer screening begins at the age of 50 years and continues through age 75 years. However, your health care provider may recommend screening at an earlier age if you have risk factors for colon cancer. On a yearly basis, your health care provider may provide home test kits to check for hidden blood in the stool. Use of a small camera at the end of a tube, to directly examine the colon (sigmoidoscopy or colonoscopy), can detect the earliest forms of colorectal cancer. Talk to your health care provider about this at age 50, when routine screening begins. Direct  exam of the colon should be repeated every 5-10 years through age 75 years, unless early forms of pre-cancerous polyps or small growths are found.  People who are at an increased risk for hepatitis B should be screened for this virus. You are considered at high risk for hepatitis B if:  You were born in a country where hepatitis B occurs often. Talk with your health care provider about which countries are considered high risk.  Your parents were born in a high-risk country and you have not received a shot to protect against hepatitis B (hepatitis B vaccine).  You have HIV or AIDS.  You use needles to inject street drugs.  You live with, or have sex with, someone who has hepatitis B.  You get hemodialysis treatment.  You take certain medicines for conditions like cancer, organ transplantation, and autoimmune conditions.  Hepatitis C blood testing is recommended for all people born from 1945 through 1965 and any individual with known risks for hepatitis C.  Practice safe sex. Use condoms and avoid high-risk sexual practices to reduce the spread of sexually transmitted infections (STIs). STIs include gonorrhea, chlamydia, syphilis, trichomonas, herpes, HPV, and human immunodeficiency virus (HIV). Herpes, HIV, and HPV are viral illnesses that have no cure. They can result in disability, cancer, and death.  You should be screened for sexually transmitted illnesses (STIs) including gonorrhea and chlamydia if:  You are sexually active and are younger than 24 years.  You are older than 24 years and your health care provider tells you that you are at risk for this type of infection.  Your sexual activity has changed since you were last screened and you are at an increased risk for chlamydia or gonorrhea. Ask your health care provider if you are at risk.  If you are at risk of being infected with HIV, it is recommended that you take a prescription medicine daily to prevent HIV infection. This is  called preexposure prophylaxis (PrEP). You are considered at risk if:  You are a heterosexual woman, are sexually active, and are at increased risk for HIV infection.  You take drugs by injection.  You are sexually active with a partner who has HIV.  Talk with your health care provider about whether you are at high risk of being infected with HIV. If you choose to begin PrEP, you should first be tested for HIV. You should then be tested every 3 months for as long as you are taking PrEP.  Osteoporosis is a disease in which the bones lose minerals and strength   with aging. This can result in serious bone fractures or breaks. The risk of osteoporosis can be identified using a bone density scan. Women ages 65 years and over and women at risk for fractures or osteoporosis should discuss screening with their health care providers. Ask your health care provider whether you should take a calcium supplement or vitamin D to reduce the rate of osteoporosis.  Menopause can be associated with physical symptoms and risks. Hormone replacement therapy is available to decrease symptoms and risks. You should talk to your health care provider about whether hormone replacement therapy is right for you.  Use sunscreen. Apply sunscreen liberally and repeatedly throughout the day. You should seek shade when your shadow is shorter than you. Protect yourself by wearing long sleeves, pants, a wide-brimmed hat, and sunglasses year round, whenever you are outdoors.  Once a month, do a whole body skin exam, using a mirror to look at the skin on your back. Tell your health care provider of new moles, moles that have irregular borders, moles that are larger than a pencil eraser, or moles that have changed in shape or color.  Stay current with required vaccines (immunizations).  Influenza vaccine. All adults should be immunized every year.  Tetanus, diphtheria, and acellular pertussis (Td, Tdap) vaccine. Pregnant women should  receive 1 dose of Tdap vaccine during each pregnancy. The dose should be obtained regardless of the length of time since the last dose. Immunization is preferred during the 27th-36th week of gestation. An adult who has not previously received Tdap or who does not know her vaccine status should receive 1 dose of Tdap. This initial dose should be followed by tetanus and diphtheria toxoids (Td) booster doses every 10 years. Adults with an unknown or incomplete history of completing a 3-dose immunization series with Td-containing vaccines should begin or complete a primary immunization series including a Tdap dose. Adults should receive a Td booster every 10 years.  Varicella vaccine. An adult without evidence of immunity to varicella should receive 2 doses or a second dose if she has previously received 1 dose. Pregnant females who do not have evidence of immunity should receive the first dose after pregnancy. This first dose should be obtained before leaving the health care facility. The second dose should be obtained 4-8 weeks after the first dose.  Human papillomavirus (HPV) vaccine. Females aged 13-26 years who have not received the vaccine previously should obtain the 3-dose series. The vaccine is not recommended for use in pregnant females. However, pregnancy testing is not needed before receiving a dose. If a female is found to be pregnant after receiving a dose, no treatment is needed. In that case, the remaining doses should be delayed until after the pregnancy. Immunization is recommended for any person with an immunocompromised condition through the age of 26 years if she did not get any or all doses earlier. During the 3-dose series, the second dose should be obtained 4-8 weeks after the first dose. The third dose should be obtained 24 weeks after the first dose and 16 weeks after the second dose.  Zoster vaccine. One dose is recommended for adults aged 60 years or older unless certain conditions are  present.  Measles, mumps, and rubella (MMR) vaccine. Adults born before 1957 generally are considered immune to measles and mumps. Adults born in 1957 or later should have 1 or more doses of MMR vaccine unless there is a contraindication to the vaccine or there is laboratory evidence of immunity to   each of the three diseases. A routine second dose of MMR vaccine should be obtained at least 28 days after the first dose for students attending postsecondary schools, health care workers, or international travelers. People who received inactivated measles vaccine or an unknown type of measles vaccine during 1963-1967 should receive 2 doses of MMR vaccine. People who received inactivated mumps vaccine or an unknown type of mumps vaccine before 1979 and are at high risk for mumps infection should consider immunization with 2 doses of MMR vaccine. For females of childbearing age, rubella immunity should be determined. If there is no evidence of immunity, females who are not pregnant should be vaccinated. If there is no evidence of immunity, females who are pregnant should delay immunization until after pregnancy. Unvaccinated health care workers born before 1957 who lack laboratory evidence of measles, mumps, or rubella immunity or laboratory confirmation of disease should consider measles and mumps immunization with 2 doses of MMR vaccine or rubella immunization with 1 dose of MMR vaccine.  Pneumococcal 13-valent conjugate (PCV13) vaccine. When indicated, a person who is uncertain of her immunization history and has no record of immunization should receive the PCV13 vaccine. An adult aged 19 years or older who has certain medical conditions and has not been previously immunized should receive 1 dose of PCV13 vaccine. This PCV13 should be followed with a dose of pneumococcal polysaccharide (PPSV23) vaccine. The PPSV23 vaccine dose should be obtained at least 8 weeks after the dose of PCV13 vaccine. An adult aged 19  years or older who has certain medical conditions and previously received 1 or more doses of PPSV23 vaccine should receive 1 dose of PCV13. The PCV13 vaccine dose should be obtained 1 or more years after the last PPSV23 vaccine dose.  Pneumococcal polysaccharide (PPSV23) vaccine. When PCV13 is also indicated, PCV13 should be obtained first. All adults aged 65 years and older should be immunized. An adult younger than age 65 years who has certain medical conditions should be immunized. Any person who resides in a nursing home or long-term care facility should be immunized. An adult smoker should be immunized. People with an immunocompromised condition and certain other conditions should receive both PCV13 and PPSV23 vaccines. People with human immunodeficiency virus (HIV) infection should be immunized as soon as possible after diagnosis. Immunization during chemotherapy or radiation therapy should be avoided. Routine use of PPSV23 vaccine is not recommended for American Indians, [place] Natives, or people younger than 65 years unless there are medical conditions that require PPSV23 vaccine. When indicated, people who have unknown immunization and have no record of immunization should receive PPSV23 vaccine. One-time revaccination 5 years after the first dose of PPSV23 is recommended for people aged 19-64 years who have chronic kidney failure, nephrotic syndrome, asplenia, or immunocompromised conditions. People who received 1-2 doses of PPSV23 before age 65 years should receive another dose of PPSV23 vaccine at age 65 years or later if at least 5 years have passed since the previous dose. Doses of PPSV23 are not needed for people immunized with PPSV23 at or after age 65 years.  Meningococcal vaccine. Adults with asplenia or persistent complement component deficiencies should receive 2 doses of quadrivalent meningococcal conjugate (MenACWY-D) vaccine. The doses should be obtained at least 2 months apart.  Microbiologists working with certain meningococcal bacteria, military recruits, people at risk during an outbreak, and people who travel to or live in countries with a high rate of meningitis should be immunized. A first-year college student up through age   21 years who is living in a residence hall should receive a dose if she did not receive a dose on or after her 16th birthday. Adults who have certain high-risk conditions should receive one or more doses of vaccine.  Hepatitis A vaccine. Adults who wish to be protected from this disease, have certain high-risk conditions, work with hepatitis A-infected animals, work in hepatitis A research labs, or travel to or work in countries with a high rate of hepatitis A should be immunized. Adults who were previously unvaccinated and who anticipate close contact with an international adoptee during the first 60 days after arrival in the [place] from a country with a high rate of hepatitis A should be immunized.  Hepatitis B vaccine. Adults who wish to be protected from this disease, have certain high-risk conditions, may be exposed to blood or other infectious body fluids, are household contacts or sex partners of hepatitis B positive people, are clients or workers in certain care facilities, or travel to or work in countries with a high rate of hepatitis B should be immunized.  Haemophilus influenzae type b (Hib) vaccine. A previously unvaccinated person with asplenia or sickle cell disease or having a scheduled splenectomy should receive 1 dose of Hib vaccine. Regardless of previous immunization, a recipient of a hematopoietic stem cell transplant should receive a 3-dose series 6-12 months after her successful transplant. Hib vaccine is not recommended for adults with HIV infection. Preventive Services / Frequency Ages 64 to 68 years  Blood pressure check.** / Every 1 to 2 years.  Lipid and cholesterol check.** / Every 5 years beginning at age  22.  Clinical breast exam.** / Every 3 years for women in their 88s and 53s.  BRCA-related cancer risk assessment.** / For women who have family members with a BRCA-related cancer (breast, ovarian, tubal, or peritoneal cancers).  Pap test.** / Every 2 years from ages 90 through 51. Every 3 years starting at age 21 through age 56 or 3 with a history of 3 consecutive normal Pap tests.  HPV screening.** / Every 3 years from ages 24 through ages 1 to 46 with a history of 3 consecutive normal Pap tests.  Hepatitis C blood test.** / For any individual with known risks for hepatitis C.  Skin self-exam. / Monthly.  Influenza vaccine. / Every year.  Tetanus, diphtheria, and acellular pertussis (Tdap, Td) vaccine.** / Consult your health care provider. Pregnant women should receive 1 dose of Tdap vaccine during each pregnancy. 1 dose of Td every 10 years.  Varicella vaccine.** / Consult your health care provider. Pregnant females who do not have evidence of immunity should receive the first dose after pregnancy.  HPV vaccine. / 3 doses over 6 months, if 72 and younger. The vaccine is not recommended for use in pregnant females. However, pregnancy testing is not needed before receiving a dose.  Measles, mumps, rubella (MMR) vaccine.** / You need at least 1 dose of MMR if you were born in 1957 or later. You may also need a 2nd dose. For females of childbearing age, rubella immunity should be determined. If there is no evidence of immunity, females who are not pregnant should be vaccinated. If there is no evidence of immunity, females who are pregnant should delay immunization until after pregnancy.  Pneumococcal 13-valent conjugate (PCV13) vaccine.** / Consult your health care provider.  Pneumococcal polysaccharide (PPSV23) vaccine.** / 1 to 2 doses if you smoke cigarettes or if you have certain conditions.  Meningococcal vaccine.** /  1 dose if you are age 76 to 45 years and a [place] living in a residence hall, or have one of several medical conditions, you need to get vaccinated against meningococcal disease. You may also need additional booster doses.  Hepatitis A vaccine.** / Consult your health care provider.  Hepatitis B vaccine.** / Consult your health care provider.  Haemophilus influenzae type b (Hib) vaccine.** / Consult your health care provider. Ages 73 to 71 years  Blood pressure check.** / Every 1 to 2 years.  Lipid and cholesterol check.** / Every 5 years beginning at age 47 years.  Lung cancer screening. / Every year if you are aged 55-80 years and have a 30-pack-year history of smoking and currently smoke or have quit within the past 15 years. Yearly screening is stopped once you have quit smoking for at least 15 years or develop a health problem that would prevent you from having lung cancer treatment.  Clinical breast exam.** / Every year after age 88 years.  BRCA-related cancer risk assessment.** / For women who have family members with a BRCA-related cancer (breast, ovarian, tubal, or peritoneal cancers).  Mammogram.** / Every year beginning at age 74 years and continuing for as long as you are in good health. Consult with your health care provider.  Pap test.** / Every 3 years starting at age 40 years through age 15 or 37 years with a history of 3 consecutive normal Pap tests.  HPV screening.** / Every 3 years from ages 29 years through ages 33 to 52 years with a history of 3 consecutive normal Pap tests.  Fecal occult blood test (FOBT) of stool. / Every year beginning at age 82 years and continuing until age 55 years. You may not need to do this test if you get a colonoscopy every 10 years.  Flexible sigmoidoscopy or colonoscopy.** / Every 5 years for a flexible sigmoidoscopy or every 10 years for a colonoscopy beginning at age 32 years and continuing until age 17 years.  Hepatitis C blood test.** / For all people born from [birth year] through  1965 and any individual with known risks for hepatitis C.  Skin self-exam. / Monthly.  Influenza vaccine. / Every year.  Tetanus, diphtheria, and acellular pertussis (Tdap/Td) vaccine.** / Consult your health care provider. Pregnant women should receive 1 dose of Tdap vaccine during each pregnancy. 1 dose of Td every 10 years.  Varicella vaccine.** / Consult your health care provider. Pregnant females who do not have evidence of immunity should receive the first dose after pregnancy.  Zoster vaccine.** / 1 dose for adults aged 41 years or older.  Measles, mumps, rubella (MMR) vaccine.** / You need at least 1 dose of MMR if you were born in 1957 or later. You may also need a 2nd dose. For females of childbearing age, rubella immunity should be determined. If there is no evidence of immunity, females who are not pregnant should be vaccinated. If there is no evidence of immunity, females who are pregnant should delay immunization until after pregnancy.  Pneumococcal 13-valent conjugate (PCV13) vaccine.** / Consult your health care provider.  Pneumococcal polysaccharide (PPSV23) vaccine.** / 1 to 2 doses if you smoke cigarettes or if you have certain conditions.  Meningococcal vaccine.** / Consult your health care provider.  Hepatitis A vaccine.** / Consult your health care provider.  Hepatitis B vaccine.** / Consult your health care provider.  Haemophilus influenzae type b (Hib) vaccine.** / Consult your health care provider. Ages 66  years and over  Blood pressure check.** / Every 1 to 2 years.  Lipid and cholesterol check.** / Every 5 years beginning at age 75 years.  Lung cancer screening. / Every year if you are aged 66-80 years and have a 30-pack-year history of smoking and currently smoke or have quit within the past 15 years. Yearly screening is stopped once you have quit smoking for at least 15 years or develop a health problem that would prevent you from having lung cancer  treatment.  Clinical breast exam.** / Every year after age 37 years.  BRCA-related cancer risk assessment.** / For women who have family members with a BRCA-related cancer (breast, ovarian, tubal, or peritoneal cancers).  Mammogram.** / Every year beginning at age 68 years and continuing for as long as you are in good health. Consult with your health care provider.  Pap test.** / Every 3 years starting at age 57 years through age 31 or 101 years with 3 consecutive normal Pap tests. Testing can be stopped between 65 and 70 years with 3 consecutive normal Pap tests and no abnormal Pap or HPV tests in the past 10 years.  HPV screening.** / Every 3 years from ages 67 years through ages 64 or 100 years with a history of 3 consecutive normal Pap tests. Testing can be stopped between 65 and 70 years with 3 consecutive normal Pap tests and no abnormal Pap or HPV tests in the past 10 years.  Fecal occult blood test (FOBT) of stool. / Every year beginning at age 83 years and continuing until age 45 years. You may not need to do this test if you get a colonoscopy every 10 years.  Flexible sigmoidoscopy or colonoscopy.** / Every 5 years for a flexible sigmoidoscopy or every 10 years for a colonoscopy beginning at age 37 years and continuing until age 74 years.  Hepatitis C blood test.** / For all people born from [birth year] through 1966 and any individual with known risks for hepatitis C.  Osteoporosis screening.** / A one-time screening for women ages 49 years and over and women at risk for fractures or osteoporosis.  Skin self-exam. / Monthly.  Influenza vaccine. / Every year.  Tetanus, diphtheria, and acellular pertussis (Tdap/Td) vaccine.** / 1 dose of Td every 10 years.  Varicella vaccine.** / Consult your health care provider.  Zoster vaccine.** / 1 dose for adults aged 63 years or older.  Pneumococcal 13-valent conjugate (PCV13) vaccine.** / Consult your health care provider.  Pneumococcal  polysaccharide (PPSV23) vaccine.** / 1 dose for all adults aged 73 years and older.  Meningococcal vaccine.** / Consult your health care provider.  Hepatitis A vaccine.** / Consult your health care provider.  Hepatitis B vaccine.** / Consult your health care provider.  Haemophilus influenzae type b (Hib) vaccine.** / Consult your health care provider. ** Family history and personal history of risk and conditions may change your health care provider's recommendations. Document Released: 06/25/2001 Document Revised: 09/13/2013 Document Reviewed: 09/24/2010 [facility] Patient Information 2015 [place], [place]. This information is not intended to replace advice given to you by your health care provider. Make sure you discuss any questions you have with your health care provider.

## 2015-01-01 NOTE — Assessment & Plan Note (Signed)

## 2015-01-02 ENCOUNTER — Encounter: Payer: Self-pay | Admitting: Internal Medicine

## 2015-01-02 LAB — HM DEXA SCAN: HM Dexa Scan: -1.8

## 2015-01-04 DIAGNOSIS — L438 Other lichen planus: Secondary | ICD-10-CM | POA: Diagnosis not present

## 2015-01-13 ENCOUNTER — Other Ambulatory Visit: Payer: Self-pay | Admitting: Oral Surgery

## 2015-01-13 DIAGNOSIS — K1230 Oral mucositis (ulcerative), unspecified: Secondary | ICD-10-CM | POA: Diagnosis not present

## 2015-01-13 DIAGNOSIS — L438 Other lichen planus: Secondary | ICD-10-CM | POA: Diagnosis not present

## 2015-02-10 ENCOUNTER — Ambulatory Visit (INDEPENDENT_AMBULATORY_CARE_PROVIDER_SITE_OTHER): Payer: Medicare Other

## 2015-02-10 DIAGNOSIS — Z23 Encounter for immunization: Secondary | ICD-10-CM | POA: Diagnosis not present

## 2015-05-08 ENCOUNTER — Ambulatory Visit
Admission: EM | Admit: 2015-05-08 | Discharge: 2015-05-08 | Disposition: A | Discharge status: Home / Self Care | Payer: Medicare Other | Attending: Family Medicine | Admitting: Family Medicine

## 2015-05-08 ENCOUNTER — Ambulatory Visit (INDEPENDENT_AMBULATORY_CARE_PROVIDER_SITE_OTHER): Payer: Medicare Other

## 2015-05-08 ENCOUNTER — Encounter: Payer: Self-pay | Admitting: Emergency Medicine

## 2015-05-08 DIAGNOSIS — S60212A Contusion of left wrist, initial encounter: Secondary | ICD-10-CM | POA: Diagnosis not present

## 2015-05-08 DIAGNOSIS — M25532 Pain in left wrist: Secondary | ICD-10-CM | POA: Diagnosis not present

## 2015-05-08 DIAGNOSIS — S6992XA Unspecified injury of left wrist, hand and finger(s), initial encounter: Secondary | ICD-10-CM | POA: Diagnosis not present

## 2015-05-08 NOTE — ED Provider Notes (Signed)
CSN: [id number]     Arrival date & time 05/08/15  1907 History   First MD Initiated Contact with Patient 05/08/15 2123     Chief Complaint  Patient presents with  . Wrist Pain  . Fall   (Consider location/radiation/quality/duration/timing/severity/associated sxs/prior Treatment) HPI Comments: 74 yo female presents with a c/o left wrist pain after falling off a chair at home today. States she landed on her left wrist bent and had subsequent pain and swelling. Also states she hit her head slightly, but denies any loss of consciousness, vision changes, headache.   The history is provided by the patient.    Past Medical History  Diagnosis Date  . Diverticulitis   . GERD (gastroesophageal reflux disease)   . Atrophic vaginitis   . MVP (mitral valve prolapse)   . Vertigo   . Osteopenia   . Eustachian tube dysfunction   . Anemia     in childhood  . Hiatal hernia   . Dyspareunia   . Diverticulosis   . Hx of adenomatous colonic polyps 02/2009    Tubular adenoma in sigmoid. Diminutive.   . Schatzki's ring 1995  . Allergic rhinitis   . Dysplastic nevus 09/2012, 10/2012    right thigh, severe atypia, resected 10/2012 by Dr Jarome Matin  . Duodenal ulcer   . Esophagitis     grade 1  . Sleep apnea 10/2004    Sleep study by Dr Gwenette Greet. does not need cpap  . Allergy   . Arthritis   . Cataract    Past Surgical History  Procedure Laterality Date  . Appendectomy  1989    ruptured  . Abdominal hysterectomy  2001    with anterior and posterior vaginal repair  . Tonsillectomy  1947  . Breast biopsy Right 1969    benign  . Septoplasty  1976  . Cervical discectomy  01/2004    C3-4, C5-6, C6-7  . Laparoscopy  04/2000    with lysis of adhesions  . Shoulder surgery Left 01/2004    release of adhesive capsulitis  . Depuytren's contraction Right 04/2006  . Dysplastic nevus  10/2012    right thigh  . Right shoulder fracture Right 03/2013    non-displaced, humeral head, non-operative mgt but  she developed frozen shoulder.   . Esophagogastroduodenoscopy N/A 06/04/2013    Procedure: ESOPHAGOGASTRODUODENOSCOPY (EGD);  Surgeon: Lafayette Dragon, MD;  Location: [facility] ENDOSCOPY;  Service: Endoscopy;  Laterality: N/A;   Family History  Problem Relation Age of Onset  . Heart attack Father     after years of angina  . Coronary artery disease Father   . Coronary artery disease Mother   . Breast cancer Maternal Aunt   . Cancer Maternal Aunt     breast, colon, ovarian  . Diabetes Brother   . Coronary artery disease Brother   . Hypertension Sister   . Hyperthyroidism Sister   . Irritable bowel syndrome Sister   . Coronary artery disease Brother     PCI-stents  . Colon cancer Neg Hx   . Diabetes Brother   . Hypertension Brother    Social History  Substance Use Topics  . Smoking status: Never Smoker   . Smokeless tobacco: Never Used  . Alcohol Use: No   OB History    No data available     Review of Systems  Allergies  Codeine; Levaquin; and Oxycodone  Home Medications   Prior to Admission medications   Medication Sig Start Date End Date Taking?  Authorizing Provider  acetaminophen (TYLENOL) 500 MG tablet Take 500-1,000 mg by mouth every 6 (six) hours as needed for mild pain or headache.    Historical Provider, MD  amoxicillin (AMOXIL) 400 MG/5ML suspension  12/26/14   Historical Provider, MD  Biotin 300 MCG TABS Take by mouth daily.    Historical Provider, MD  Calcium Carbonate (CALTRATE 600) 1500 MG TABS Take 1 tablet by mouth 2 (two) times daily. chewables    Historical Provider, MD  cetirizine (ZYRTEC) 10 MG tablet Take 1 tablet (10 mg total) by mouth daily. 07/19/14   Janith Lima, MD  Cholecalciferol (VITAMIN D-3 PO) Take 400 Units by mouth daily.    Historical Provider, MD  cyanocobalamin 500 MCG tablet Take 500 mcg by mouth every morning.    Historical Provider, MD  glucosamine-chondroitin 500-400 MG tablet Take 1 tablet by mouth 2 (two) times daily.    Historical  Provider, MD  Multiple Vitamins-Minerals (MULTIVITAMIN WITH MINERALS) tablet Take 1 tablet by mouth every morning. chewables    Historical Provider, MD  omega-3 acid ethyl esters (LOVAZA) 1 G capsule Take 1 g by mouth every morning.    Historical Provider, MD  pantoprazole (PROTONIX) 40 MG tablet Take 1 tablet daily before  breakfast 08/09/14   Janith Lima, MD  Psyllium (METAMUCIL PO) Take by mouth daily.    Historical Provider, MD  saccharomyces boulardii (FLORASTOR) 250 MG capsule Take 1 capsule (250 mg total) by mouth 2 (two) times daily. 06/01/13   Venetia Maxon Rama, MD  vitamin C (ASCORBIC ACID) 250 MG tablet Take 250 mg by mouth 2 (two) times daily.    Historical Provider, MD  vitamin E 400 UNIT capsule Take 400 Units by mouth every morning.     Historical Provider, MD   Meds Ordered and Administered this Visit  Medications - No data to display  BP 152/67 mmHg  Pulse 89  Temp(Src) 97.8 F (36.6 C) (Tympanic)  Resp 16  Ht 5\' 3"  (1.6 m)  Wt 120 lb (54.432 kg)  BMI 21.26 kg/m2  SpO2 99% No data found.   Physical Exam  Constitutional: She appears well-developed and well-nourished. No distress.  HENT:  Head: Normocephalic and atraumatic.  Neck: Normal range of motion. Neck supple.  Musculoskeletal: She exhibits edema.       Left wrist: She exhibits decreased range of motion (secondary to pain), tenderness, bony tenderness and swelling. She exhibits no effusion, no crepitus, no deformity and no laceration.       Arms: Neurological: She is alert.  Skin: She is not diaphoretic.  Nursing note and vitals reviewed.   ED Course  Procedures (including critical care time)  Labs Review Labs Reviewed - No data to display  Imaging Review Dg Wrist Complete Left  05/08/2015  CLINICAL DATA:  74 year old female with left wrist pain after fall. EXAM: LEFT WRIST - COMPLETE 3+ VIEW COMPARISON:  None. FINDINGS: There is diffuse osteopenia. No acute fracture or dislocation. The soft tissues  are grossly unremarkable with no radiopaque foreign object. IMPRESSION: No acute fracture or dislocation. Electronically Signed   By: Anner Crete M.D.   On: 05/08/2015 20:10     Visual Acuity Review  Right Eye Distance:   Left Eye Distance:   Bilateral Distance:    Right Eye Near:   Left Eye Near:    Bilateral Near:         MDM   1. Wrist contusion, left, initial encounter    Discharge Medication  List as of 05/08/2015  9:36 PM     1. x-ray results (negative for acute fracture or dislocation) and diagnosis reviewed with patient 2. rx as per orders above; reviewed possible side effects, interactions, risks and benefits  3. Recommend supportive treatment with rest, ice, elevation, otc analgesics prn; velcro wrist splint given to patient for support 4. Follow-up prn if symptoms worsen or don't improve; may also f/u with orthopedist if symptoms worsen or are not improving    Norval Gable, MD 05/08/15 2214

## 2015-05-08 NOTE — ED Notes (Addendum)
Patient c/o left wrist pain after falling off a chair today.   Patient states that she hit the left side of her head.  Patient denies LOC.  Patient c/o pain at the back of her head.

## 2015-07-20 LAB — BASIC METABOLIC PANEL
Creatinine: 0.5 mg/dL (ref <=1.1)
Creatinine: 0.6 mg/dL (ref 0.5–1.1)

## 2015-07-20 LAB — TSH: TSH: 1.1 u[IU]/mL (ref <=5.90)

## 2015-08-03 ENCOUNTER — Other Ambulatory Visit (INDEPENDENT_AMBULATORY_CARE_PROVIDER_SITE_OTHER): Payer: Medicare Other

## 2015-08-03 ENCOUNTER — Encounter: Payer: Self-pay | Admitting: Internal Medicine

## 2015-08-03 ENCOUNTER — Ambulatory Visit (INDEPENDENT_AMBULATORY_CARE_PROVIDER_SITE_OTHER): Payer: Medicare Other | Admitting: Internal Medicine

## 2015-08-03 VITALS — BP 120/82 | HR 65 | Temp 97.6°F | Resp 16 | Ht 63.0 in | Wt 120.5 lb

## 2015-08-03 DIAGNOSIS — R9431 Abnormal electrocardiogram [ECG] [EKG]: Secondary | ICD-10-CM

## 2015-08-03 DIAGNOSIS — K591 Functional diarrhea: Secondary | ICD-10-CM

## 2015-08-03 DIAGNOSIS — R0609 Other forms of dyspnea: Secondary | ICD-10-CM | POA: Insufficient documentation

## 2015-08-03 DIAGNOSIS — N281 Cyst of kidney, acquired: Secondary | ICD-10-CM

## 2015-08-03 DIAGNOSIS — Q6100 Congenital renal cyst, unspecified: Secondary | ICD-10-CM

## 2015-08-03 DIAGNOSIS — K589 Irritable bowel syndrome without diarrhea: Secondary | ICD-10-CM | POA: Diagnosis not present

## 2015-08-03 LAB — CBC WITH DIFFERENTIAL/PLATELET
Basophils Absolute: 0 10*3/uL (ref 0.0–0.1)
Basophils Relative: 0.4 % (ref 0.0–3.0)
Eosinophils Absolute: 0.2 10*3/uL (ref 0.0–0.7)
Eosinophils Relative: 3.1 % (ref 0.0–5.0)
HCT: 38.5 % (ref 36.0–46.0)
Hemoglobin: 13 g/dL (ref 12.0–15.0)
Lymphocytes Relative: 27.3 % (ref 12.0–46.0)
Lymphs Abs: 1.5 10*3/uL (ref 0.7–4.0)
MCHC: 33.8 g/dL (ref 30.0–36.0)
MCV: 91 fl (ref 78.0–100.0)
Monocytes Absolute: 0.5 10*3/uL (ref 0.1–1.0)
Monocytes Relative: 8.5 % (ref 3.0–12.0)
Neutro Abs: 3.3 10*3/uL (ref 1.4–7.7)
Neutrophils Relative %: 60.7 % (ref 43.0–77.0)
Platelets: 224 10*3/uL (ref 150.0–400.0)
RBC: 4.23 Mil/uL (ref 3.87–5.11)
RDW: 13.9 % (ref 11.5–15.5)
WBC: 5.4 10*3/uL (ref 4.0–10.5)

## 2015-08-03 LAB — URINALYSIS, ROUTINE W REFLEX MICROSCOPIC
Bilirubin Urine: NEGATIVE
Hgb urine dipstick: NEGATIVE
Ketones, ur: NEGATIVE
Leukocytes, UA: NEGATIVE
Nitrite: NEGATIVE
Specific Gravity, Urine: 1.02 (ref 1.000–1.030)
Total Protein, Urine: NEGATIVE
Urine Glucose: NEGATIVE
Urobilinogen, UA: 0.2 (ref 0.0–1.0)
pH: 5.5 (ref 5.0–8.0)

## 2015-08-03 LAB — BASIC METABOLIC PANEL
BUN: 15 mg/dL (ref 6–23)
CO2: 32 mEq/L (ref 19–32)
Calcium: 9.5 mg/dL (ref 8.4–10.5)
Chloride: 105 mEq/L (ref 96–112)
Creatinine, Ser: 0.77 mg/dL (ref 0.40–1.20)
GFR: 77.42 mL/min (ref >60.00)
Glucose, Bld: 102 mg/dL — ABNORMAL HIGH (ref 70–99)
Potassium: 3.7 mEq/L (ref 3.5–5.1)
Sodium: 143 mEq/L (ref 135–145)

## 2015-08-03 MED ORDER — ELUXADOLINE 75 MG PO TABS: 1.0000 | ORAL_TABLET | Freq: Two times a day (BID) | ORAL | Status: DC

## 2015-08-03 NOTE — Progress Notes (Signed)
Pre visit review using our clinic review tool, if applicable. No additional management support is needed unless otherwise documented below in the visit note. (sarah self, [employer] student  documented this record)  

## 2015-08-03 NOTE — Progress Notes (Signed)
Subjective:  Patient ID: Karla Roman, female    DOB: [birth year]  Age: 75 y.o. MRN: [id number]  CC: Diarrhea and Shortness of Breath   Karla Roman presents for several complaints.  She complains of chronic diarrhea, stool urgency and rare cramping. She has about 3 watery bowel movements a day. She tells me the diarrhea is so severe that it limits her life and she just does not travel anymore for fear of getting caught in a compromising situation.  She also complains of worsening dyspnea exertion over the last 3-4 months. She notices it when she is climbing stairs. She denies chest pain, diaphoresis, palpitations, edema, fatigue, or syncope.  Outpatient Prescriptions Prior to Visit  Medication Sig Dispense Refill  . acetaminophen (TYLENOL) 500 MG tablet Take 500-1,000 mg by mouth every 6 (six) hours as needed for mild pain or headache.    . Biotin 300 MCG TABS Take by mouth daily.    . Calcium Carbonate (CALTRATE 600) 1500 MG TABS Take 1 tablet by mouth 2 (two) times daily. chewables    . Cholecalciferol (VITAMIN D-3 PO) Take 400 Units by mouth daily.    . cyanocobalamin 500 MCG tablet Take 500 mcg by mouth every morning.    Marland Kitchen glucosamine-chondroitin 500-400 MG tablet Take 1 tablet by mouth 2 (two) times daily.    . Multiple Vitamins-Minerals (MULTIVITAMIN WITH MINERALS) tablet Take 1 tablet by mouth every morning. chewables    . omega-3 acid ethyl esters (LOVAZA) 1 G capsule Take 1 g by mouth every morning.    . pantoprazole (PROTONIX) 40 MG tablet Take 1 tablet daily before  breakfast 90 tablet 3  . Psyllium (METAMUCIL PO) Take by mouth daily.    Marland Kitchen saccharomyces boulardii (FLORASTOR) 250 MG capsule Take 1 capsule (250 mg total) by mouth 2 (two) times daily. 60 capsule 0  . vitamin C (ASCORBIC ACID) 250 MG tablet Take 250 mg by mouth 2 (two) times daily.    . vitamin E 400 UNIT capsule Take 400 Units by mouth every morning.     Marland Kitchen amoxicillin (AMOXIL) 400 MG/5ML  suspension     . cetirizine (ZYRTEC) 10 MG tablet Take 1 tablet (10 mg total) by mouth daily. 30 tablet 11   No facility-administered medications prior to visit.    ROS Review of Systems  Constitutional: Negative.  Negative for fever, chills, diaphoresis, activity change, appetite change, fatigue and unexpected weight change.  HENT: Negative.  Negative for sore throat and trouble swallowing.   Eyes: Negative.   Respiratory: Positive for shortness of breath. Negative for cough, choking, chest tightness, wheezing and stridor.   Cardiovascular: Negative.  Negative for chest pain, palpitations and leg swelling.  Gastrointestinal: Positive for diarrhea. Negative for nausea, vomiting, abdominal pain, constipation, blood in stool, abdominal distention, anal bleeding and rectal pain.  Endocrine: Negative.   Genitourinary: Negative.   Musculoskeletal: Negative.  Negative for myalgias, back pain, joint swelling and arthralgias.  Skin: Negative.  Negative for color change and rash.  Allergic/Immunologic: Negative.   Neurological: Negative.  Negative for dizziness, speech difficulty, weakness and light-headedness.  Hematological: Negative.   Psychiatric/Behavioral: Negative.     Objective:  BP 120/82 mmHg  Pulse 65  Temp(Src) 97.6 F (36.4 C) (Oral)  Resp 16  Ht 5\' 3"  (1.6 m)  Wt 120 lb 8 oz (54.658 kg)  BMI 21.35 kg/m2  SpO2 97%  BP Readings from Last 3 Encounters:  08/03/15 120/82  05/08/15 152/67  12/29/14 128/62  Wt Readings from Last 3 Encounters:  08/03/15 120 lb 8 oz (54.658 kg)  05/08/15 120 lb (54.432 kg)  12/29/14 122 lb (55.339 kg)    Physical Exam  Constitutional: She is oriented to person, place, and time. No distress.  HENT:  Head: Normocephalic and atraumatic.  Mouth/Throat: Oropharynx is clear and moist. No oropharyngeal exudate.  Eyes: Conjunctivae are normal. Right eye exhibits no discharge. Left eye exhibits no discharge. No scleral icterus.  Neck: Normal  range of motion. Neck supple. No JVD present. No tracheal deviation present. No thyromegaly present.  Cardiovascular: Normal rate, regular rhythm, normal heart sounds and intact distal pulses.  Exam reveals no gallop and no friction rub.   No murmur heard. EKG --  Sinus  Rhythm  -RSR(V1) -nondiagnostic. The RSR  is a new finding  PROBABLY NORMAL   Pulmonary/Chest: Effort normal and breath sounds normal. No stridor. No respiratory distress. She has no wheezes. She has no rales. She exhibits no tenderness.  Abdominal: Soft. Bowel sounds are normal. She exhibits no distension and no mass. There is no tenderness. There is no rebound and no guarding.  Musculoskeletal: Normal range of motion. She exhibits no edema or tenderness.  Lymphadenopathy:    She has no cervical adenopathy.  Neurological: She is oriented to person, place, and time.  Skin: Skin is warm and dry. No rash noted. She is not diaphoretic. No erythema. No pallor.  Vitals reviewed.   Lab Results  Component Value Date   WBC 5.4 08/03/2015   HGB 13.0 08/03/2015   HCT 38.5 08/03/2015   PLT 224.0 08/03/2015   GLUCOSE 102* 08/03/2015   CHOL 227* 12/29/2014   TRIG 64.0 12/29/2014   HDL 83.70 12/29/2014   LDLDIRECT 115.1 12/23/2012   LDLCALC 131* 12/29/2014   ALT 15 12/29/2014   AST 21 12/29/2014   NA 143 08/03/2015   K 3.7 08/03/2015   CL 105 08/03/2015   CREATININE 0.77 08/03/2015   BUN 15 08/03/2015   CO2 32 08/03/2015   TSH 1.10 07/20/2015    Dg Wrist Complete Left  05/08/2015  CLINICAL DATA:  75 year old female with left wrist pain after fall. EXAM: LEFT WRIST - COMPLETE 3+ VIEW COMPARISON:  None. FINDINGS: There is diffuse osteopenia. No acute fracture or dislocation. The soft tissues are grossly unremarkable with no radiopaque foreign object. IMPRESSION: No acute fracture or dislocation. Electronically Signed   By: Anner Crete M.D.   On: 05/08/2015 20:10    Assessment & Plan:   Karla Roman was seen today  for diarrhea and shortness of breath.  Diagnoses and all orders for this visit:  Renal cyst, right- Based on her symptoms, urinalysis and renal function there does not appear to be a complication related to this. -     Cancel: Basic metabolic panel; Future -     Basic metabolic panel; Future -     Urinalysis, Routine w reflex microscopic (not at [facility]); Future  IBS (irritable bowel syndrome)- she has had a significant impact on her life from IBS of asked her to start taking Viberzi for symptom relief -     Discontinue: Eluxadoline (VIBERZI) 75 MG TABS; Take 1 tablet by mouth 2 (two) times daily. -     Discontinue: Eluxadoline (VIBERZI) 75 MG TABS; Take 1 tablet by mouth 2 (two) times daily. -     Eluxadoline (VIBERZI) 75 MG TABS; Take 1 tablet by mouth 2 (two) times daily.  Functional diarrhea- I've ordered labs to screen for secondary  causes of diarrhea such as celiac disease, anemia, and to screen for other complications. For now I think she has IBS with diarrhea and of asked her to try Viberzi -     Discontinue: Eluxadoline (VIBERZI) 75 MG TABS; Take 1 tablet by mouth 2 (two) times daily. -     CBC with Differential/Platelet; Future -     Gliadin antibodies, serum -     Tissue transglutaminase, IgA -     Reticulin Antibody, IgA w reflex titer -     Discontinue: Eluxadoline (VIBERZI) 75 MG TABS; Take 1 tablet by mouth 2 (two) times daily. -     Eluxadoline (VIBERZI) 75 MG TABS; Take 1 tablet by mouth 2 (two) times daily.  DOE (dyspnea on exertion)- her labs do not show any secondary metabolic causes for dyspnea on exertion, she has a new finding on her EKG and she is not willing to do another treadmill test so I have ordered a myocardial perfusion imaging to screen for ischemic heart disease. -     CBC with Differential/Platelet; Future -     Cancel: Basic metabolic panel; Future -     EKG 12-Lead -     Myocardial Perfusion Imaging; Future  Nonspecific abnormal electrocardiogram (ECG)  (EKG)- as above -     Myocardial Perfusion Imaging; Future   I have discontinued Ms. Clinkscales's cetirizine and amoxicillin. I am also having her maintain her calcium carbonate, multivitamin with minerals, vitamin E, acetaminophen, cyanocobalamin, omega-3 acid ethyl esters, saccharomyces boulardii, Psyllium (METAMUCIL PO), Biotin, vitamin C, glucosamine-chondroitin, Cholecalciferol (VITAMIN D-3 PO), pantoprazole, and Eluxadoline.  Meds ordered this encounter  Medications  . DISCONTD: Eluxadoline (VIBERZI) 75 MG TABS    Sig: Take 1 tablet by mouth 2 (two) times daily.    Dispense:  60 tablet    Refill:  5  . DISCONTD: Eluxadoline (VIBERZI) 75 MG TABS    Sig: Take 1 tablet by mouth 2 (two) times daily.    Dispense:  90 tablet    Refill:  1  . Eluxadoline (VIBERZI) 75 MG TABS    Sig: Take 1 tablet by mouth 2 (two) times daily.    Dispense:  180 tablet    Refill:  1     Follow-up: Return in about 4 months (around 12/03/2015).  Scarlette Calico, MD

## 2015-08-03 NOTE — Patient Instructions (Signed)
Irritable Bowel Syndrome, Adult Irritable bowel syndrome (IBS) is not one specific disease. It is a group of symptoms that affects the organs responsible for digestion (gastrointestinal or GI tract).  To regulate how your GI tract works, your body sends signals back and forth between your intestines and your brain. If you have IBS, there may be a problem with these signals. As a result, your GI tract does not function normally. Your intestines may become more sensitive and overreact to certain things. This is especially true when you eat certain foods or when you are under stress.  There are four types of IBS. These may be determined based on the consistency of your stool:   IBS with diarrhea.   IBS with constipation.   Mixed IBS.   Unsubtyped IBS.  It is important to know which type of IBS you have. Some treatments are more likely to be helpful for certain types of IBS.  CAUSES  The exact cause of IBS is not known. RISK FACTORS You may have a higher risk of IBS if:  You are a woman.  You are younger than 76 years old.  You have a family history of IBS.  You have mental health problems.  You have had bacterial infection of your GI tract. SIGNS AND SYMPTOMS  Symptoms of IBS vary from person to person. The main symptom is abdominal pain or discomfort. Additional symptoms usually include one or more of the following:   Diarrhea, constipation, or both.   Abdominal swelling or bloating.   Feeling full or sick after eating a small or regular-size meal.   Frequent gas.   Mucus in the stool.   A feeling of having more stool left after a bowel movement.  Symptoms tend to come and go. They may be associated with stress, psychiatric conditions, or nothing at all.  DIAGNOSIS  There is no specific test to diagnose IBS. Your health care provider will make a diagnosis based on a physical exam, medical history, and your symptoms. You may have other tests to rule out other  conditions that may be causing your symptoms. These may include:   Blood tests.   X-rays.   CT scan.  Endoscopy and colonoscopy. This is a test in which your GI tract is viewed with a long, thin, flexible tube. TREATMENT There is no cure for IBS, but treatment can help relieve symptoms. IBS treatment often includes:   Changes to your diet, such as:  Eating more fiber.  Avoiding foods that cause symptoms.  Drinking more water.  Eating regular, medium-sized portioned meals.  Medicines. These may include:  Fiber supplements if you have constipation.  Medicine to control diarrhea (antidiarrheal medicines).  Medicine to help control muscle spasms in your GI tract (antispasmodic medicines).  Medicines to help with any mental health issues, such as antidepressants or tranquilizers.  Therapy.  Talk therapy may help with anxiety, depression, or other mental health issues that can make IBS symptoms worse.  Stress reduction.  Managing your stress can help keep symptoms under control. HOME CARE INSTRUCTIONS   Take medicines only as directed by your health care provider.  Eat a healthy diet.  Avoid foods and drinks with added sugar.  Include more whole grains, fruits, and vegetables gradually into your diet. This may be especially helpful if you have IBS with constipation.  Avoid any foods and drinks that make your symptoms worse. These may include dairy products and caffeinated or carbonated drinks.  Do not eat large meals.    Drink enough fluid to keep your urine clear or pale yellow.  Exercise regularly. Ask your health care provider for recommendations of good activities for you.  Keep all follow-up visits as directed by your health care provider. This is important. SEEK MEDICAL CARE IF:   You have constant pain.  You have trouble or pain with swallowing.  You have worsening diarrhea. SEEK IMMEDIATE MEDICAL CARE IF:   You have severe and worsening abdominal  pain.   You have diarrhea and:   You have a rash, stiff neck, or severe headache.   You are irritable, sleepy, or difficult to awaken.   You are weak, dizzy, or extremely thirsty.   You have bright red blood in your stool or you have black tarry stools.   You have unusual abdominal swelling that is painful.   You vomit continuously.   You vomit blood (hematemesis).   You have both abdominal pain and a fever.    This information is not intended to replace advice given to you by your health care provider. Make sure you discuss any questions you have with your health care provider.   Document Released: 04/29/2005 Document Revised: 05/20/2014 Document Reviewed: 01/14/2014 Elsevier Interactive Patient Education 2016 [employer] Inc[employer]  

## 2015-08-04 ENCOUNTER — Telehealth: Payer: Self-pay | Admitting: Internal Medicine

## 2015-08-04 NOTE — Telephone Encounter (Signed)
Patient states she was given a form that has [facility] on it.  She was wanting to know what that form was for.  Please call back in regards.

## 2015-08-07 DIAGNOSIS — Z1231 Encounter for screening mammogram for malignant neoplasm of breast: Secondary | ICD-10-CM | POA: Diagnosis not present

## 2015-08-07 DIAGNOSIS — Z803 Family history of malignant neoplasm of breast: Secondary | ICD-10-CM | POA: Diagnosis not present

## 2015-08-07 LAB — HM MAMMOGRAPHY: HM Mammogram: NORMAL (ref 0–4)

## 2015-08-07 NOTE — Telephone Encounter (Signed)
Spoke to pt. Informed that I was not aware of any form that would have made it to her. She is going to call back let me know what the forms say at the top.

## 2015-08-07 NOTE — Telephone Encounter (Signed)
Pt called back and said the top of the page said [facility] e-req [place], then her name, ins, etc 2nd page has test ordered 3 total She can be reached at [phone]

## 2015-08-08 ENCOUNTER — Telehealth (HOSPITAL_COMMUNITY): Payer: Self-pay | Admitting: *Deleted

## 2015-08-08 NOTE — Telephone Encounter (Signed)
Pt informed that the form is not important and that the orders are in her medical record.

## 2015-08-08 NOTE — Telephone Encounter (Signed)
Forwarding to Judson Roch -   Please let pt know that the below form mentioned is a requisition form and she does not need to keep and is not important. Our orders are in the medical record. She can keep them if she wants to.

## 2015-08-08 NOTE — Telephone Encounter (Signed)
Patient given detailed instructions per Myocardial Perfusion Study Information Sheet for the test on 08/11/15 at 0745. Patient notified to arrive 15 minutes early and that it is imperative to arrive on time for appointment to keep from having the test rescheduled.  If you need to cancel or reschedule your appointment, please call the office within 24 hours of your appointment. Failure to do so may result in a cancellation of your appointment, and a $50 no show fee. Patient verbalized understanding.Allante Beane, Ranae Palms

## 2015-08-09 ENCOUNTER — Encounter: Payer: Self-pay | Admitting: Internal Medicine

## 2015-08-11 ENCOUNTER — Encounter: Payer: Self-pay | Admitting: Internal Medicine

## 2015-08-11 ENCOUNTER — Ambulatory Visit (HOSPITAL_COMMUNITY): Payer: Medicare Other | Attending: Cardiology

## 2015-08-11 DIAGNOSIS — R0609 Other forms of dyspnea: Secondary | ICD-10-CM | POA: Insufficient documentation

## 2015-08-11 DIAGNOSIS — R9431 Abnormal electrocardiogram [ECG] [EKG]: Secondary | ICD-10-CM

## 2015-08-11 LAB — MYOCARDIAL PERFUSION IMAGING
LV dias vol: 62 mL (ref 46–106)
LV sys vol: 17 mL
Peak HR: 106 {beats}/min
RATE: 0.29
Rest HR: 72 {beats}/min
SDS: 0
SRS: 3
SSS: 3
TID: 0.9

## 2015-08-11 LAB — HM MAMMOGRAPHY

## 2015-08-11 MED ORDER — TECHNETIUM TC 99M SESTAMIBI GENERIC - CARDIOLITE
10.2000 | Freq: Once | INTRAVENOUS | Status: AC | PRN
  Administered 2015-08-11: 10 via INTRAVENOUS

## 2015-08-11 MED ORDER — REGADENOSON 0.4 MG/5ML IV SOLN
0.4000 mg | Freq: Once | INTRAVENOUS | Status: AC
  Administered 2015-08-11: 0.4 mg via INTRAVENOUS

## 2015-08-11 MED ORDER — TECHNETIUM TC 99M SESTAMIBI GENERIC - CARDIOLITE
31.1000 | Freq: Once | INTRAVENOUS | Status: AC | PRN
  Administered 2015-08-11: 31.1 via INTRAVENOUS

## 2015-09-02 ENCOUNTER — Other Ambulatory Visit: Payer: Self-pay | Admitting: Internal Medicine

## 2015-09-05 ENCOUNTER — Encounter: Payer: Self-pay | Admitting: Internal Medicine

## 2015-09-05 ENCOUNTER — Ambulatory Visit (INDEPENDENT_AMBULATORY_CARE_PROVIDER_SITE_OTHER): Payer: Medicare Other | Admitting: Internal Medicine

## 2015-09-05 ENCOUNTER — Telehealth: Payer: Self-pay

## 2015-09-05 ENCOUNTER — Ambulatory Visit (INDEPENDENT_AMBULATORY_CARE_PROVIDER_SITE_OTHER)
Admission: RE | Admit: 2015-09-05 | Discharge: 2015-09-05 | Disposition: A | Discharge status: Home / Self Care | Payer: Medicare Other | Source: Ambulatory Visit | Attending: Internal Medicine | Admitting: Internal Medicine

## 2015-09-05 VITALS — BP 140/70 | HR 70 | Temp 97.8°F | Resp 16 | Ht 63.0 in | Wt 123.0 lb

## 2015-09-05 DIAGNOSIS — R05 Cough: Secondary | ICD-10-CM | POA: Diagnosis not present

## 2015-09-05 DIAGNOSIS — J4531 Mild persistent asthma with (acute) exacerbation: Secondary | ICD-10-CM

## 2015-09-05 DIAGNOSIS — R059 Cough, unspecified: Secondary | ICD-10-CM

## 2015-09-05 DIAGNOSIS — J4520 Mild intermittent asthma, uncomplicated: Secondary | ICD-10-CM | POA: Insufficient documentation

## 2015-09-05 MED ORDER — METHYLPREDNISOLONE ACETATE 80 MG/ML IJ SUSP
120.0000 mg | Freq: Once | INTRAMUSCULAR | Status: AC
  Administered 2015-09-05: 120 mg via INTRAMUSCULAR

## 2015-09-05 MED ORDER — PROMETHAZINE-DM 6.25-15 MG/5ML PO SYRP: 5.0000 mL | ORAL_SOLUTION | Freq: Four times a day (QID) | ORAL | Status: DC | PRN

## 2015-09-05 MED ORDER — BUDESONIDE-FORMOTEROL FUMARATE 80-4.5 MCG/ACT IN AERO: 2.0000 | INHALATION_SPRAY | Freq: Two times a day (BID) | RESPIRATORY_TRACT | Status: DC

## 2015-09-05 NOTE — Progress Notes (Signed)
Pre visit review using our clinic review tool, if applicable. No additional management support is needed unless otherwise documented below in the visit note. 

## 2015-09-05 NOTE — Telephone Encounter (Signed)
PA initiated via CoverMyMeds key [id number]

## 2015-09-05 NOTE — Patient Instructions (Signed)

## 2015-09-05 NOTE — Progress Notes (Signed)
Subjective:  Patient ID: Karla Roman, female    DOB: [birth year]  Age: 75 y.o. MRN: [id number]  CC: Cough   HPI Karla Roman presents for  NP cough for 5 days with wheezing. She denies fever, chills, chest pain, hemoptysis, night sweats, or sore throat.  She is doing well on Viberzi with no more episodes of abdominal pain, cramping, or diarrhea.  Outpatient Prescriptions Prior to Visit  Medication Sig Dispense Refill  . acetaminophen (TYLENOL) 500 MG tablet Take 500-1,000 mg by mouth every 6 (six) hours as needed for mild pain or headache.    . Biotin 300 MCG TABS Take by mouth daily.    . Calcium Carbonate (CALTRATE 600) 1500 MG TABS Take 1 tablet by mouth 2 (two) times daily. chewables    . Cholecalciferol (VITAMIN D-3 PO) Take 400 Units by mouth daily.    . cyanocobalamin 500 MCG tablet Take 500 mcg by mouth every morning.    . Eluxadoline (VIBERZI) 75 MG TABS Take 1 tablet by mouth 2 (two) times daily. 180 tablet 1  . glucosamine-chondroitin 500-400 MG tablet Take 1 tablet by mouth 2 (two) times daily.    . Multiple Vitamins-Minerals (MULTIVITAMIN WITH MINERALS) tablet Take 1 tablet by mouth every morning. chewables    . omega-3 acid ethyl esters (LOVAZA) 1 G capsule Take 1 g by mouth every morning.    . pantoprazole (PROTONIX) 40 MG tablet Take 1 tablet daily before  breakfast 90 tablet 3  . Psyllium (METAMUCIL PO) Take by mouth daily.    Marland Kitchen saccharomyces boulardii (FLORASTOR) 250 MG capsule Take 1 capsule (250 mg total) by mouth 2 (two) times daily. 60 capsule 0  . vitamin C (ASCORBIC ACID) 250 MG tablet Take 250 mg by mouth 2 (two) times daily.    . vitamin E 400 UNIT capsule Take 400 Units by mouth every morning.      No facility-administered medications prior to visit.    ROS Review of Systems  Constitutional: Negative.  Negative for fever, chills, diaphoresis, appetite change and fatigue.  HENT: Negative.  Negative for congestion, facial swelling, postnasal  drip, rhinorrhea, sinus pressure, sneezing, sore throat and trouble swallowing.   Eyes: Negative.   Respiratory: Positive for cough and wheezing. Negative for apnea, choking, chest tightness, shortness of breath and stridor.   Cardiovascular: Negative.  Negative for chest pain, palpitations and leg swelling.  Gastrointestinal: Negative.  Negative for nausea, vomiting, abdominal pain, diarrhea and constipation.  Endocrine: Negative.   Genitourinary: Negative.   Musculoskeletal: Negative.  Negative for myalgias, back pain, joint swelling and arthralgias.  Skin: Negative.  Negative for color change and rash.  Allergic/Immunologic: Negative.   Neurological: Negative.   Hematological: Negative.  Negative for adenopathy. Does not bruise/bleed easily.  Psychiatric/Behavioral: Negative.     Objective:  BP 140/70 mmHg  Pulse 70  Temp(Src) 97.8 F (36.6 C) (Oral)  Resp 16  Ht 5\' 3"  (1.6 m)  Wt 123 lb (55.792 kg)  BMI 21.79 kg/m2  SpO2 98%  BP Readings from Last 3 Encounters:  09/05/15 140/70  08/03/15 120/82  05/08/15 152/67    Wt Readings from Last 3 Encounters:  09/05/15 123 lb (55.792 kg)  08/03/15 120 lb 8 oz (54.658 kg)  05/08/15 120 lb (54.432 kg)    Physical Exam  Constitutional: She is oriented to person, place, and time.  Non-toxic appearance. She does not have a sickly appearance. She does not appear ill. No distress.  HENT:  Mouth/Throat:  Oropharynx is clear and moist. No oropharyngeal exudate.  Eyes: Conjunctivae are normal. Right eye exhibits no discharge. Left eye exhibits no discharge. No scleral icterus.  Neck: Normal range of motion. Neck supple. No JVD present. No tracheal deviation present. No thyromegaly present.  Cardiovascular: Normal rate, regular rhythm, normal heart sounds and intact distal pulses.  Exam reveals no gallop and no friction rub.   No murmur heard. Pulmonary/Chest: Effort normal. No accessory muscle usage or stridor. No tachypnea. No  respiratory distress. She has no decreased breath sounds. She has wheezes in the right middle field, the right lower field, the left middle field and the left lower field. She has rhonchi in the right upper field and the left upper field. She has no rales. She exhibits no tenderness.  There is diffuse, faint inspiratory rhonchi and diffuse, faint expiratory wheezing and rhonchi. She has good air movement.  Abdominal: Soft. Bowel sounds are normal. She exhibits no distension and no mass. There is no tenderness. There is no rebound and no guarding.  Musculoskeletal: Normal range of motion. She exhibits no edema or tenderness.  Lymphadenopathy:    She has no cervical adenopathy.  Neurological: She is oriented to person, place, and time.  Skin: Skin is warm and dry. No rash noted. She is not diaphoretic. No erythema. No pallor.  Vitals reviewed.   Lab Results  Component Value Date   WBC 5.4 08/03/2015   HGB 13.0 08/03/2015   HCT 38.5 08/03/2015   PLT 224.0 08/03/2015   GLUCOSE 102* 08/03/2015   CHOL 227* 12/29/2014   TRIG 64.0 12/29/2014   HDL 83.70 12/29/2014   LDLDIRECT 115.1 12/23/2012   LDLCALC 131* 12/29/2014   ALT 15 12/29/2014   AST 21 12/29/2014   NA 143 08/03/2015   K 3.7 08/03/2015   CL 105 08/03/2015   CREATININE 0.77 08/03/2015   BUN 15 08/03/2015   CO2 32 08/03/2015   TSH 1.10 07/20/2015    Dg Wrist Complete Left  05/08/2015  CLINICAL DATA:  75 year old female with left wrist pain after fall. EXAM: LEFT WRIST - COMPLETE 3+ VIEW COMPARISON:  None. FINDINGS: There is diffuse osteopenia. No acute fracture or dislocation. The soft tissues are grossly unremarkable with no radiopaque foreign object. IMPRESSION: No acute fracture or dislocation. Electronically Signed   By: Anner Crete M.D.   On: 05/08/2015 20:10    Assessment & Plan:   [name] was seen today for cough.  Diagnoses and all orders for this visit:  Asthma with acute exacerbation, mild persistent-  this is either new onset asthma or a prior history of asthma with acute exacerbation, I've given her injection of Depo-Medrol IM and asked her to start using Symbicort twice a day. I gave her a sample of Symbicort and showed her how to use it, she demonstrated proficiency with its use. -     budesonide-formoterol (SYMBICORT) 80-4.5 MCG/ACT inhaler; Inhale 2 puffs into the lungs 2 (two) times daily. -     methylPREDNISolone acetate (DEPO-MEDROL) injection 120 mg; Inject 1.5 mLs (120 mg total) into the muscle once.  Cough- her chest x-ray is negative for pneumonia, her symptoms are consistent with a viral upper respiratory infection so will not prescribe antibiotics, she will take Phenergan DM as needed for cough. -     DG Chest 2 View; Future -     promethazine-dextromethorphan (PROMETHAZINE-DM) 6.25-15 MG/5ML syrup; Take 5 mLs by mouth 4 (four) times daily as needed for cough. -  methylPREDNISolone acetate (DEPO-MEDROL) injection 120 mg; Inject 1.5 mLs (120 mg total) into the muscle once.  I am having Ms. Huster start on budesonide-formoterol and promethazine-dextromethorphan. I am also having her maintain her calcium carbonate, multivitamin with minerals, vitamin E, acetaminophen, cyanocobalamin, omega-3 acid ethyl esters, saccharomyces boulardii, Psyllium (METAMUCIL PO), Biotin, vitamin C, glucosamine-chondroitin, Cholecalciferol (VITAMIN D-3 PO), pantoprazole, and Eluxadoline. We administered methylPREDNISolone acetate.  Meds ordered this encounter  Medications  . budesonide-formoterol (SYMBICORT) 80-4.5 MCG/ACT inhaler    Sig: Inhale 2 puffs into the lungs 2 (two) times daily.    Dispense:  1 Inhaler    Refill:  11  . promethazine-dextromethorphan (PROMETHAZINE-DM) 6.25-15 MG/5ML syrup    Sig: Take 5 mLs by mouth 4 (four) times daily as needed for cough.    Dispense:  118 mL    Refill:  0  . methylPREDNISolone acetate (DEPO-MEDROL) injection 120 mg    Sig:      Follow-up: Return in  about 3 weeks (around 09/26/2015).  Scarlette Calico, MD

## 2015-09-05 NOTE — Telephone Encounter (Signed)
Pt in office provided number for PA for Viberzie [phone] pt states she has 5 pills left. Informed her it could take up to a week or more for ins response. Did not see where we have received anotification for this

## 2015-09-06 NOTE — Telephone Encounter (Signed)
APPROVED through 05/12/2016, pt advised via home VM

## 2015-09-07 ENCOUNTER — Telehealth: Payer: Self-pay | Admitting: Internal Medicine

## 2015-09-07 DIAGNOSIS — K591 Functional diarrhea: Secondary | ICD-10-CM

## 2015-09-07 DIAGNOSIS — K589 Irritable bowel syndrome without diarrhea: Secondary | ICD-10-CM

## 2015-09-07 MED ORDER — ELUXADOLINE 75 MG PO TABS: 1.0000 | ORAL_TABLET | Freq: Two times a day (BID) | ORAL | Status: DC

## 2015-09-07 NOTE — Telephone Encounter (Signed)
Pt called in and wants Dr Ronnald Ramp to sent in 90 day supply of  Eluxadoline (VIBERZI) 75 MG TABS [[id number]]       Eluxadoline (VIBERZI) 75 MG TABS [id number]     To pt Mail order

## 2015-09-07 NOTE — Telephone Encounter (Signed)
Reprinted Rx will sent to pharmacy

## 2015-09-07 NOTE — Telephone Encounter (Signed)
fxd to [facility]

## 2015-09-25 DIAGNOSIS — L918 Other hypertrophic disorders of the skin: Secondary | ICD-10-CM | POA: Diagnosis not present

## 2015-09-25 DIAGNOSIS — L821 Other seborrheic keratosis: Secondary | ICD-10-CM | POA: Diagnosis not present

## 2015-09-25 DIAGNOSIS — L812 Freckles: Secondary | ICD-10-CM | POA: Diagnosis not present

## 2015-09-25 DIAGNOSIS — L814 Other melanin hyperpigmentation: Secondary | ICD-10-CM | POA: Diagnosis not present

## 2015-09-25 DIAGNOSIS — D225 Melanocytic nevi of trunk: Secondary | ICD-10-CM | POA: Diagnosis not present

## 2015-10-10 ENCOUNTER — Ambulatory Visit (INDEPENDENT_AMBULATORY_CARE_PROVIDER_SITE_OTHER): Payer: Medicare Other | Admitting: Internal Medicine

## 2015-10-10 ENCOUNTER — Encounter: Payer: Self-pay | Admitting: Internal Medicine

## 2015-10-10 VITALS — BP 128/70 | HR 65 | Temp 97.8°F | Resp 16 | Ht 63.0 in | Wt 125.0 lb

## 2015-10-10 DIAGNOSIS — K589 Irritable bowel syndrome without diarrhea: Secondary | ICD-10-CM

## 2015-10-10 DIAGNOSIS — J4520 Mild intermittent asthma, uncomplicated: Secondary | ICD-10-CM

## 2015-10-10 NOTE — Patient Instructions (Signed)
Asthma, Adult Asthma is a recurring condition in which the airways tighten and narrow. Asthma can make it difficult to breathe. It can cause coughing, wheezing, and shortness of breath. Asthma episodes, also called asthma attacks, range from minor to life-threatening. Asthma cannot be cured, but medicines and lifestyle changes can help control it. CAUSES Asthma is believed to be caused by inherited (genetic) and environmental factors, but its exact cause is unknown. Asthma may be triggered by allergens, lung infections, or irritants in the air. Asthma triggers are different for each person. Common triggers include:   Animal dander.  Dust mites.  Cockroaches.  Pollen from trees or grass.  Mold.  Smoke.  Air pollutants such as dust, household cleaners, hair sprays, aerosol sprays, paint fumes, strong chemicals, or strong odors.  Cold air, weather changes, and winds (which increase molds and pollens in the air).  Strong emotional expressions such as crying or laughing hard.  Stress.  Certain medicines (such as aspirin) or types of drugs (such as beta-blockers).  Sulfites in foods and drinks. Foods and drinks that may contain sulfites include dried fruit, potato chips, and sparkling grape juice.  Infections or inflammatory conditions such as the flu, a cold, or an inflammation of the nasal membranes (rhinitis).  Gastroesophageal reflux disease (GERD).  Exercise or strenuous activity. SYMPTOMS Symptoms may occur immediately after asthma is triggered or many hours later. Symptoms include:  Wheezing.  Excessive nighttime or early morning coughing.  Frequent or severe coughing with a common cold.  Chest tightness.  Shortness of breath. DIAGNOSIS  The diagnosis of asthma is made by a review of your medical history and a physical exam. Tests may also be performed. These may include:  Lung function studies. These tests show how much air you breathe in and out.  Allergy  tests.  Imaging tests such as X-rays. TREATMENT  Asthma cannot be cured, but it can usually be controlled. Treatment involves identifying and avoiding your asthma triggers. It also involves medicines. There are 2 classes of medicine used for asthma treatment:   Controller medicines. These prevent asthma symptoms from occurring. They are usually taken every day.  Reliever or rescue medicines. These quickly relieve asthma symptoms. They are used as needed and provide short-term relief. Your health care provider will help you create an asthma action plan. An asthma action plan is a written plan for managing and treating your asthma attacks. It includes a list of your asthma triggers and how they may be avoided. It also includes information on when medicines should be taken and when their dosage should be changed. An action plan may also involve the use of a device called a peak flow meter. A peak flow meter measures how well the lungs are working. It helps you monitor your condition. HOME CARE INSTRUCTIONS   Take medicines only as directed by your health care provider. Speak with your health care provider if you have questions about how or when to take the medicines.  Use a peak flow meter as directed by your health care provider. Record and keep track of readings.  Understand and use the action plan to help minimize or stop an asthma attack without needing to seek medical care.  Control your home environment in the following ways to help prevent asthma attacks:  Do not smoke. Avoid being exposed to secondhand smoke.  Change your heating and air conditioning filter regularly.  Limit your use of fireplaces and wood stoves.  Get rid of pests (such as roaches   and mice) and their droppings.  Throw away plants if you see mold on them.  Clean your floors and dust regularly. Use unscented cleaning products.  Try to have someone else vacuum for you regularly. Stay out of rooms while they are  being vacuumed and for a short while afterward. If you vacuum, use a dust mask from a hardware store, a double-layered or microfilter vacuum cleaner bag, or a vacuum cleaner with a HEPA filter.  Replace carpet with wood, tile, or vinyl flooring. Carpet can trap dander and dust.  Use allergy-proof pillows, mattress covers, and box spring covers.  Wash bed sheets and blankets every week in hot water and dry them in a dryer.  Use blankets that are made of polyester or cotton.  Clean bathrooms and kitchens with bleach. If possible, have someone repaint the walls in these rooms with mold-resistant paint. Keep out of the rooms that are being cleaned and painted.  Wash hands frequently. SEEK MEDICAL CARE IF:   You have wheezing, shortness of breath, or a cough even if taking medicine to prevent attacks.  The colored mucus you cough up (sputum) is thicker than usual.  Your sputum changes from clear or white to yellow, green, gray, or bloody.  You have any problems that may be related to the medicines you are taking (such as a rash, itching, swelling, or trouble breathing).  You are using a reliever medicine more than 2-3 times per week.  Your peak flow is still at 50-79% of your personal best after following your action plan for 1 hour.  You have a fever. SEEK IMMEDIATE MEDICAL CARE IF:   You seem to be getting worse and are unresponsive to treatment during an asthma attack.  You are short of breath even at rest.  You get short of breath when doing very little physical activity.  You have difficulty eating, drinking, or talking due to asthma symptoms.  You develop chest pain.  You develop a fast heartbeat.  You have a bluish color to your lips or fingernails.  You are light-headed, dizzy, or faint.  Your peak flow is less than 50% of your personal best.   This information is not intended to replace advice given to you by your health care provider. Make sure you discuss any  questions you have with your health care provider.   Document Released: 04/29/2005 Document Revised: 01/18/2015 Document Reviewed: 11/26/2012 Elsevier Interactive Patient Education 2016 [employer]  

## 2015-10-10 NOTE — Progress Notes (Signed)
Pre visit review using our clinic review tool, if applicable. No additional management support is needed unless otherwise documented below in the visit note. 

## 2015-10-10 NOTE — Progress Notes (Signed)
Subjective:  Patient ID: Karla Roman, female    DOB: 1940-10-27  Age: 75 y.o. MRN: [id number]  CC: Asthma   HPI Karla Roman presents for follow-up after recent upper respiratory infection complicated by asthma. She is using Symbicort inhaler as directed and has noticed a significant symptom improvement. She's had no recent episodes of cough or wheezing.  Outpatient Prescriptions Prior to Visit  Medication Sig Dispense Refill  . acetaminophen (TYLENOL) 500 MG tablet Take 500-1,000 mg by mouth every 6 (six) hours as needed for mild pain or headache.    . Biotin 300 MCG TABS Take by mouth daily.    . budesonide-formoterol (SYMBICORT) 80-4.5 MCG/ACT inhaler Inhale 2 puffs into the lungs 2 (two) times daily. 1 Inhaler 11  . Calcium Carbonate (CALTRATE 600) 1500 MG TABS Take 1 tablet by mouth 2 (two) times daily. chewables    . Cholecalciferol (VITAMIN D-3 PO) Take 400 Units by mouth daily.    . cyanocobalamin 500 MCG tablet Take 500 mcg by mouth every morning.    . Eluxadoline (VIBERZI) 75 MG TABS Take 1 tablet by mouth 2 (two) times daily. 180 tablet 1  . glucosamine-chondroitin 500-400 MG tablet Take 1 tablet by mouth 2 (two) times daily.    . Multiple Vitamins-Minerals (MULTIVITAMIN WITH MINERALS) tablet Take 1 tablet by mouth every morning. chewables    . omega-3 acid ethyl esters (LOVAZA) 1 G capsule Take 1 g by mouth every morning.    . pantoprazole (PROTONIX) 40 MG tablet Take 1 tablet daily before  breakfast 90 tablet 3  . pantoprazole (PROTONIX) 40 MG tablet TAKE 1 TABLET BY MOUTH  DAILY BEFORE BREAKFAST 90 tablet 1  . Psyllium (METAMUCIL PO) Take by mouth daily.    Marland Kitchen saccharomyces boulardii (FLORASTOR) 250 MG capsule Take 1 capsule (250 mg total) by mouth 2 (two) times daily. 60 capsule 0  . vitamin C (ASCORBIC ACID) 250 MG tablet Take 250 mg by mouth 2 (two) times daily.    . vitamin E 400 UNIT capsule Take 400 Units by mouth every morning.     .  promethazine-dextromethorphan (PROMETHAZINE-DM) 6.25-15 MG/5ML syrup Take 5 mLs by mouth 4 (four) times daily as needed for cough. 118 mL 0   No facility-administered medications prior to visit.    ROS Review of Systems  Constitutional: Negative.   HENT: Negative.  Negative for facial swelling, sore throat and trouble swallowing.   Eyes: Negative.   Respiratory: Negative.  Negative for cough, choking, chest tightness, shortness of breath and stridor.   Cardiovascular: Negative.  Negative for chest pain, palpitations and leg swelling.  Gastrointestinal: Negative.  Negative for nausea, vomiting, abdominal pain, diarrhea, constipation and blood in stool.  Endocrine: Negative.   Genitourinary: Negative.   Musculoskeletal: Negative.  Negative for myalgias, back pain, joint swelling and arthralgias.  Skin: Negative.  Negative for color change and rash.  Allergic/Immunologic: Negative.   Neurological: Negative.   Hematological: Negative.  Negative for adenopathy. Does not bruise/bleed easily.  Psychiatric/Behavioral: Negative.     Objective:  BP 128/70 mmHg  Pulse 65  Temp(Src) 97.8 F (36.6 C) (Oral)  Resp 16  Ht 5\' 3"  (1.6 m)  Wt 125 lb (56.7 kg)  BMI 22.15 kg/m2  SpO2 97%  BP Readings from Last 3 Encounters:  10/10/15 128/70  09/05/15 140/70  08/03/15 120/82    Wt Readings from Last 3 Encounters:  10/10/15 125 lb (56.7 kg)  09/05/15 123 lb (55.792 kg)  08/03/15 120  lb 8 oz (54.658 kg)    Physical Exam  Constitutional: She is oriented to person, place, and time. No distress.  HENT:  Mouth/Throat: Oropharynx is clear and moist. No oropharyngeal exudate.  Eyes: Conjunctivae are normal. Right eye exhibits no discharge. Left eye exhibits no discharge. No scleral icterus.  Neck: Normal range of motion. Neck supple. No JVD present. No tracheal deviation present. No thyromegaly present.  Cardiovascular: Normal rate, regular rhythm, normal heart sounds and intact distal pulses.   Exam reveals no gallop and no friction rub.   No murmur heard. Pulmonary/Chest: Effort normal and breath sounds normal. No stridor. No respiratory distress. She has no wheezes. She has no rales. She exhibits no tenderness.  Abdominal: Soft. Bowel sounds are normal. She exhibits no distension and no mass. There is no tenderness. There is no rebound and no guarding.  Musculoskeletal: Normal range of motion. She exhibits no edema or tenderness.  Lymphadenopathy:    She has no cervical adenopathy.  Neurological: She is oriented to person, place, and time.  Skin: Skin is warm and dry. No rash noted. She is not diaphoretic. No erythema. No pallor.  Vitals reviewed.   Lab Results  Component Value Date   WBC 5.4 08/03/2015   HGB 13.0 08/03/2015   HCT 38.5 08/03/2015   PLT 224.0 08/03/2015   GLUCOSE 102* 08/03/2015   CHOL 227* 12/29/2014   TRIG 64.0 12/29/2014   HDL 83.70 12/29/2014   LDLDIRECT 115.1 12/23/2012   LDLCALC 131* 12/29/2014   ALT 15 12/29/2014   AST 21 12/29/2014   NA 143 08/03/2015   K 3.7 08/03/2015   CL 105 08/03/2015   CREATININE 0.77 08/03/2015   BUN 15 08/03/2015   CO2 32 08/03/2015   TSH 1.10 07/20/2015    Dg Chest 2 View  09/05/2015  CLINICAL DATA:  Cough and wheezing for the past 5 days; history of pneumonia. EXAM: CHEST  2 VIEW COMPARISON:  PA and lateral chest x-ray of July 20, 2013 FINDINGS: The lungs remain hyperinflated with mild hemidiaphragm flattening. There is no focal infiltrate. The interstitial markings are coarse but not greatly changed from the previous study. There is no pleural effusion or pneumothorax. The heart and pulmonary vascularity are normal. The mediastinum is normal in width. The trachea is midline. The bony thorax exhibits no acute abnormality. IMPRESSION: COPD-reactive airway disease. There is no evidence of pneumonia, CHF, nor other acute cardiopulmonary abnormality. Electronically Signed   By: David  Martinique M.D.   On: 09/05/2015 12:58      Assessment & Plan:   Meela was seen today for asthma.  Diagnoses and all orders for this visit:  IBS (irritable bowel syndrome)- her symptoms have improved significantly on Viberzi, will continue  Asthma, mild intermittent, uncomplicated- her symptoms are well controlled on the combination of a LABA + ICS  I have discontinued Ms. Lutterman's promethazine-dextromethorphan. I am also having her maintain her calcium carbonate, multivitamin with minerals, vitamin E, acetaminophen, cyanocobalamin, omega-3 acid ethyl esters, saccharomyces boulardii, Psyllium (METAMUCIL PO), Biotin, vitamin C, glucosamine-chondroitin, Cholecalciferol (VITAMIN D-3 PO), pantoprazole, pantoprazole, budesonide-formoterol, and Eluxadoline.  No orders of the defined types were placed in this encounter.     Follow-up: No Follow-up on file.  Scarlette Calico, MD

## 2015-11-20 ENCOUNTER — Emergency Department
Admission: EM | Admit: 2015-11-20 | Discharge: 2015-11-20 | Disposition: A | Discharge status: Home / Self Care | Payer: Medicare Other | Attending: Emergency Medicine | Admitting: Emergency Medicine

## 2015-11-20 ENCOUNTER — Emergency Department: Payer: Medicare Other

## 2015-11-20 DIAGNOSIS — I341 Nonrheumatic mitral (valve) prolapse: Secondary | ICD-10-CM | POA: Diagnosis not present

## 2015-11-20 DIAGNOSIS — E785 Hyperlipidemia, unspecified: Secondary | ICD-10-CM | POA: Diagnosis not present

## 2015-11-20 DIAGNOSIS — M8580 Other specified disorders of bone density and structure, unspecified site: Secondary | ICD-10-CM | POA: Insufficient documentation

## 2015-11-20 DIAGNOSIS — J45909 Unspecified asthma, uncomplicated: Secondary | ICD-10-CM | POA: Insufficient documentation

## 2015-11-20 DIAGNOSIS — R079 Chest pain, unspecified: Secondary | ICD-10-CM | POA: Diagnosis not present

## 2015-11-20 DIAGNOSIS — R0789 Other chest pain: Secondary | ICD-10-CM | POA: Diagnosis not present

## 2015-11-20 DIAGNOSIS — Z79899 Other long term (current) drug therapy: Secondary | ICD-10-CM | POA: Insufficient documentation

## 2015-11-20 DIAGNOSIS — M1990 Unspecified osteoarthritis, unspecified site: Secondary | ICD-10-CM | POA: Diagnosis not present

## 2015-11-20 HISTORY — DX: Unspecified asthma, uncomplicated: J45.909

## 2015-11-20 HISTORY — DX: Hyperlipidemia, unspecified: E78.5

## 2015-11-20 LAB — TROPONIN I
Troponin I: <0.03 ng/mL (ref <0.03)
Troponin I: <0.03 ng/mL (ref <0.03)

## 2015-11-20 LAB — CBC
HCT: 36.3 % (ref 35.0–47.0)
Hemoglobin: 12.6 g/dL (ref 12.0–16.0)
MCH: 31.8 pg (ref 26.0–34.0)
MCHC: 34.8 g/dL (ref 32.0–36.0)
MCV: 91.5 fL (ref 80.0–100.0)
Platelets: 188 10*3/uL (ref 150–440)
RBC: 3.97 MIL/uL (ref 3.80–5.20)
RDW: 13.9 % (ref 11.5–14.5)
WBC: 7.2 10*3/uL (ref 3.6–11.0)

## 2015-11-20 LAB — BASIC METABOLIC PANEL
Anion gap: 7 (ref 5–15)
BUN: 19 mg/dL (ref 6–20)
CO2: 26 mmol/L (ref 22–32)
Calcium: 9.4 mg/dL (ref 8.9–10.3)
Chloride: 106 mmol/L (ref 101–111)
Creatinine, Ser: 0.78 mg/dL (ref 0.44–1.00)
GFR calc Af Amer: >60 mL/min (ref >60)
GFR calc non Af Amer: >60 mL/min (ref >60)
Glucose, Bld: 98 mg/dL (ref 65–99)
Potassium: 3.9 mmol/L (ref 3.5–5.1)
Sodium: 139 mmol/L (ref 135–145)

## 2015-11-20 MED ORDER — IOPAMIDOL (ISOVUE-370) INJECTION 76%
85.0000 mL | Freq: Once | INTRAVENOUS | Status: AC | PRN
  Administered 2015-11-20: 85 mL via INTRAVENOUS

## 2015-11-20 NOTE — ED Notes (Signed)
MD Kinner at bedside  

## 2015-11-20 NOTE — ED Notes (Signed)
t c/o right sided chest pain that radiates into the right shoudler.. Denies N/V/SOB.Marland Kitchen

## 2015-11-20 NOTE — ED Provider Notes (Signed)
Patient signed out from Dr. Corky Downs. This is a 75 year old female complaining of chest pain with radiation to the right side of the chest. She has been pain-free throughout her stay in the emergency department. The plan is to follow up with a second troponin as well as a CT angiography. If all are reassuring and the patient will be discharged home. Physical Exam  BP 137/62 mmHg  Pulse 76  Temp(Src) 97.9 F (36.6 C) (Oral)  Resp 16  Ht 5\' 2"  (1.575 m)  Wt 120 lb (54.432 kg)  BMI 21.94 kg/m2  SpO2 98% ----------------------------------------- 5:41 PM on 11/20/2015 -----------------------------------------   Physical Exam Patient is resting comfortable without any distress. ED Course  Procedures  MDM CT ANGIO CHEST AORTA W/CM &/OR WO/CM (Final result) Result time: 11/20/15 16:52:03   Final result by Rad Results In Interface (11/20/15 16:52:03)   Narrative:   CLINICAL DATA: 75 year old female with a history of right-sided chest pain radiating to the shoulder  EXAM: CT CHEST WITHOUT AND WITH CONTRAST  TECHNIQUE: Multidetector CT imaging of the chest was performed following the standard protocol before and during bolus administration of intravenous contrast.  CONTRAST: 85 cc Isovue 370  COMPARISON: Chest CT 04/03/2011, abdominal CT 06/03/2013, chest x-ray 11/20/2015  FINDINGS: Chest:  Nonvascular  Unremarkable appearance of the superficial soft tissues. No axillary or supraclavicular adenopathy.  Unremarkable appearance of the thoracic inlet.  No mediastinal adenopathy.  Unremarkable appearance of the thoracic esophagus. Small hiatal hernia.  Linear opacity in the dependent aspects of the bilateral upper lobes with architectural distortion. This is unchanged from the comparison CT of 04/03/2011.  Airways are clear.  Partially calcified granuloma of the right middle lobe, unchanged from prior. No confluent airspace disease, pneumothorax, or  pleural effusion.  Vascular:  Cardiac:  Heart size within normal limits. No pericardial fluid/thickening.  Calcifications of the left main, left anterior descending, circumflex, right coronary arteries.  Aorta:  Noncontrast exam demonstrates no crescentic hyperdensity to suggest intramural hematoma.  Normal course caliber and contour of the thoracic aorta. Diameter of the ascending aorta and descending aorta unremarkable. No dissection flap. No periaortic fluid or inflammatory changes.  Scattered calcifications of the aortic arch.  Three vessel arch. Patency of the proximal branch vessels maintained, with no occlusion or significant stenosis.  No central, lobar, segmental, or proximal subsegmental filling defects of the pulmonary arterial tree.  Upper abdomen:  Unremarkable appearance of the nonvascular structures of the upper abdomen.  Atherosclerotic changes of the aorta of the upper abdomen, with no acute finding identified.  Musculoskeletal:  Partially imaged surgical changes of the cervical region.  No displaced fracture.  Mild degenerative changes of the thoracic spine. No significant bony canal narrowing  IMPRESSION: No acute finding of the chest CT to account for the patient's symptoms. No evidence of acute aortic syndrome.  Left main and 3 vessel coronary artery disease. Office based assessment may be considered.  Small hiatal hernia.  Aortic atherosclerosis.  Signed,  Dulcy Fanny. Earleen Newport, DO  [facility]  [facility]   Electronically Signed By: Corrie Mckusick D.O. On: 11/20/2015 16:52      Patient will be discharged home and will be following up with cardiology as an outpatient. I explained the results of the imaging as well as the plan to the family who are understanding and will comply.       Orbie Pyo, MD 11/20/15 [phone]

## 2015-11-20 NOTE — ED Notes (Signed)
Pt transported to Xray at this time.

## 2015-11-20 NOTE — ED Notes (Signed)
Patient transported to CT at this time. 

## 2015-11-20 NOTE — ED Provider Notes (Signed)
[facility] Emergency Department Provider Note   ____________________________________________    I have reviewed the triage vital signs and the nursing notes.   HISTORY  Chief Complaint Chest Pain     HPI Karla Roman is a 75 y.o. female who presents with complaints of chest pain. Patient reports she had right-sided chest discomfort that radiated to her right shoulder blade. This lasted approximately 30 minutes and resolved prior to arrival. She feels well now and has no discomfort. She is never had this before. No history of heart disease. No fevers chills, shortness of breath. No diaphoresis. No recent travel Pain or swelling.   Past Medical History  Diagnosis Date  . Diverticulitis   . GERD (gastroesophageal reflux disease)   . Atrophic vaginitis   . MVP (mitral valve prolapse)   . Vertigo   . Osteopenia   . Eustachian tube dysfunction   . Anemia     in childhood  . Hiatal hernia   . Dyspareunia   . Diverticulosis   . Hx of adenomatous colonic polyps 02/2009    Tubular adenoma in sigmoid. Diminutive.   . Schatzki's ring 1995  . Allergic rhinitis   . Dysplastic nevus 09/2012, 10/2012    right thigh, severe atypia, resected 10/2012 by Dr Jarome Matin  . Duodenal ulcer   . Esophagitis     grade 1  . Sleep apnea 10/2004    Sleep study by Dr Gwenette Greet. does not need cpap  . Allergy   . Arthritis   . Cataract   . Hyperlipemia     Patient Active Problem List   Diagnosis Date Noted  . Asthma, mild intermittent 09/05/2015  . IBS (irritable bowel syndrome) 08/03/2015  . Nonspecific abnormal electrocardiogram (ECG) (EKG) 08/03/2015  . Tinnitus 12/29/2014  . Spinal stenosis of cervical region 10/11/2014  . Hyperlipidemia with target LDL less than 130 12/27/2013  . Duodenal ulcer, with obstruction 06/28/2013  . Renal cyst, right 06/16/2013  . Routine general medical examination at a health care facility 12/15/2010  . OSTEOPENIA 12/08/2007   . MITRAL VALVE PROLAPSE 12/29/2006  . Allergic rhinitis 12/29/2006  . GERD 12/29/2006    Past Surgical History  Procedure Laterality Date  . Appendectomy  1989    ruptured  . Abdominal hysterectomy  2001    with anterior and posterior vaginal repair  . Tonsillectomy  1947  . Breast biopsy Right 1969    benign  . Septoplasty  1976  . Cervical discectomy  01/2004    C3-4, C5-6, C6-7  . Laparoscopy  04/2000    with lysis of adhesions  . Shoulder surgery Left 01/2004    release of adhesive capsulitis  . Depuytren's contraction Right 04/2006  . Dysplastic nevus  10/2012    right thigh  . Right shoulder fracture Right 03/2013    non-displaced, humeral head, non-operative mgt but she developed frozen shoulder.   . Esophagogastroduodenoscopy N/A 06/04/2013    Procedure: ESOPHAGOGASTRODUODENOSCOPY (EGD);  Surgeon: Lafayette Dragon, MD;  Location: [facility] ENDOSCOPY;  Service: Endoscopy;  Laterality: N/A;    Current Outpatient Rx  Name  Route  Sig  Dispense  Refill  . acetaminophen (TYLENOL) 500 MG tablet   Oral   Take 500-1,000 mg by mouth every 6 (six) hours as needed for mild pain or headache.         . Biotin 300 MCG TABS   Oral   Take 1 tablet by mouth daily.          Marland Kitchen  budesonide-formoterol (SYMBICORT) 80-4.5 MCG/ACT inhaler   Inhalation   Inhale 2 puffs into the lungs 2 (two) times daily.   1 Inhaler   11   . Calcium Carbonate (CALTRATE 600) 1500 MG TABS   Oral   Take 1 tablet by mouth 2 (two) times daily. chewables         . Cholecalciferol (VITAMIN D-3 PO)   Oral   Take 400 Units by mouth daily.         . cyanocobalamin 500 MCG tablet   Oral   Take 500 mcg by mouth every morning.         . Eluxadoline (VIBERZI) 75 MG TABS   Oral   Take 1 tablet by mouth 2 (two) times daily.   180 tablet   1   . glucosamine-chondroitin 500-400 MG tablet   Oral   Take 1 tablet by mouth 2 (two) times daily.         . Multiple Vitamins-Minerals (MULTIVITAMIN WITH  MINERALS) tablet   Oral   Take 1 tablet by mouth every morning. chewables         . omega-3 acid ethyl esters (LOVAZA) 1 G capsule   Oral   Take 1 g by mouth every morning.         . pantoprazole (PROTONIX) 40 MG tablet      Take 1 tablet daily before  breakfast   90 tablet   3   . Psyllium (METAMUCIL PO)   Oral   Take 1 packet by mouth daily.          Marland Kitchen saccharomyces boulardii (FLORASTOR) 250 MG capsule   Oral   Take 1 capsule (250 mg total) by mouth 2 (two) times daily.   60 capsule   0   . vitamin C (ASCORBIC ACID) 250 MG tablet   Oral   Take 250 mg by mouth 2 (two) times daily.         . vitamin E 400 UNIT capsule   Oral   Take 400 Units by mouth every morning.          . pantoprazole (PROTONIX) 40 MG tablet      TAKE 1 TABLET BY MOUTH  DAILY BEFORE BREAKFAST   90 tablet   1     Allergies Codeine; Levaquin; and Oxycodone  Family History  Problem Relation Age of Onset  . Heart attack Father     after years of angina  . Coronary artery disease Father   . Coronary artery disease Mother   . Breast cancer Maternal Aunt   . Cancer Maternal Aunt     breast, colon, ovarian  . Diabetes Brother   . Coronary artery disease Brother   . Hypertension Sister   . Hyperthyroidism Sister   . Irritable bowel syndrome Sister   . Coronary artery disease Brother     PCI-stents  . Colon cancer Neg Hx   . Diabetes Brother   . Hypertension Brother     Social History Social History  Substance Use Topics  . Smoking status: Never Smoker   . Smokeless tobacco: Never Used  . Alcohol Use: No    Review of Systems  Constitutional: No fever/chills Eyes: No visual changes.  ENT: No Neck pain Cardiovascular: As above Respiratory: Denies shortness of breath. Gastrointestinal: No abdominal pain.  No nausea, no vomiting.   Genitourinary: Negative for dysuria. Musculoskeletal: As above Skin: Negative for rash. No diaphoresis Neurological: Negative for  headaches or weakness  10-point ROS otherwise negative.  ____________________________________________   PHYSICAL EXAM:  VITAL SIGNS: ED Triage Vitals  Enc Vitals Group     BP 11/20/15 1230 137/57 mmHg     Pulse Rate 11/20/15 1230 68     Resp 11/20/15 1230 18     Temp 11/20/15 1230 97.9 F (36.6 C)     Temp Source 11/20/15 1230 Oral     SpO2 11/20/15 1230 98 %     Weight 11/20/15 1230 120 lb (54.432 kg)     Height 11/20/15 1230 5\' 2"  (1.575 m)     Head Cir --      Peak Flow --      Pain Score --      Pain Loc --      Pain Edu? --      Excl. in [place]? --     Constitutional: Alert and oriented. No acute distress. Pleasant and interactive Eyes: Conjunctivae are normal.  Head: Normocephalic Nose: No congestion/rhinnorhea. Mouth/Throat: Mucous membranes are moist.   Neck: Painless ROM Cardiovascular: Normal rate, regular rhythm. Grossly normal heart sounds.  Good peripheral circulation. Respiratory: Normal respiratory effort.  No retractions. Lungs CTAB. Gastrointestinal: Soft and nontender. No distention.  No CVA tenderness. Genitourinary: deferred Musculoskeletal: No lower extremity tenderness nor edema.  Warm and well perfused Neurologic:  Normal speech and language. No gross focal neurologic deficits are appreciated.  Skin:  Skin is warm, dry and intact. No rash noted. Psychiatric: Mood and affect are normal. Speech and behavior are normal.  ____________________________________________   LABS (all labs ordered are listed, but only abnormal results are displayed)  Labs Reviewed  BASIC METABOLIC PANEL  CBC  TROPONIN I  TROPONIN I   ____________________________________________  EKG  ED ECG REPORT I, Lavonia Drafts, the attending physician, personally viewed and interpreted this ECG.  Date: 11/20/2015 EKG Time: 12:37 PM Rate: 71 Rhythm: normal sinus rhythm QRS Axis: normal Intervals: normal ST/T Wave abnormalities: normal Conduction Disturbances:  none Narrative Interpretation: unremarkable  ____________________________________________  RADIOLOGY  X-ray unremarkable ____________________________________________   PROCEDURES  Procedure(s) performed: No    Critical Care performed: No ____________________________________________   INITIAL IMPRESSION / ASSESSMENT AND PLAN / ED COURSE  Pertinent labs & imaging results that were available during my care of the patient were reviewed by me and considered in my medical decision making (see chart for details).  Patient well-appearing and pain-free in the emergency department. Given her history we will check labs, CT angiography to rule out aortic dissection although I have a low suspicion.   Her EKG is very reassuring. Her initial troponin is normal. We will send a second 3 hour troponin. CT angiography is pending. If normal and patient is asymptomatic I feel she is appropriate for outpatient follow-up with cardiology. I have asked my colleague to follow up on the CT scan ____________________________________________   FINAL CLINICAL IMPRESSION(S) / ED DIAGNOSES  Final diagnoses:  Chest pain, unspecified chest pain type      NEW MEDICATIONS STARTED DURING THIS VISIT:  New Prescriptions   No medications on file     Note:  This document was prepared using Dragon voice recognition software and may include unintentional dictation errors.    Lavonia Drafts, MD 11/20/15 [phone]

## 2015-11-21 ENCOUNTER — Telehealth: Payer: Self-pay | Admitting: Cardiovascular Disease

## 2015-11-21 NOTE — Telephone Encounter (Signed)
lmov to call back and make and ED fu from 11/20/15

## 2015-12-05 ENCOUNTER — Ambulatory Visit: Payer: Medicare Other | Admitting: Cardiology

## 2016-01-02 ENCOUNTER — Encounter: Payer: Self-pay | Admitting: Internal Medicine

## 2016-01-02 ENCOUNTER — Ambulatory Visit (INDEPENDENT_AMBULATORY_CARE_PROVIDER_SITE_OTHER): Payer: Medicare Other | Admitting: Internal Medicine

## 2016-01-02 ENCOUNTER — Other Ambulatory Visit (INDEPENDENT_AMBULATORY_CARE_PROVIDER_SITE_OTHER): Payer: Medicare Other

## 2016-01-02 VITALS — BP 120/58 | HR 66 | Temp 97.9°F | Ht 62.0 in | Wt 124.5 lb

## 2016-01-02 DIAGNOSIS — Z0000 Encounter for general adult medical examination without abnormal findings: Secondary | ICD-10-CM

## 2016-01-02 DIAGNOSIS — Q6100 Congenital renal cyst, unspecified: Secondary | ICD-10-CM

## 2016-01-02 DIAGNOSIS — N281 Cyst of kidney, acquired: Secondary | ICD-10-CM

## 2016-01-02 DIAGNOSIS — K219 Gastro-esophageal reflux disease without esophagitis: Secondary | ICD-10-CM

## 2016-01-02 DIAGNOSIS — K589 Irritable bowel syndrome without diarrhea: Secondary | ICD-10-CM

## 2016-01-02 DIAGNOSIS — E785 Hyperlipidemia, unspecified: Secondary | ICD-10-CM | POA: Diagnosis not present

## 2016-01-02 LAB — CBC WITH DIFFERENTIAL/PLATELET
Basophils Absolute: 0 10*3/uL (ref 0.0–0.1)
Basophils Relative: 0.4 % (ref 0.0–3.0)
Eosinophils Absolute: 0.2 10*3/uL (ref 0.0–0.7)
Eosinophils Relative: 2.8 % (ref 0.0–5.0)
HCT: 39.4 % (ref 36.0–46.0)
Hemoglobin: 13.3 g/dL (ref 12.0–15.0)
Lymphocytes Relative: 26.8 % (ref 12.0–46.0)
Lymphs Abs: 1.7 10*3/uL (ref 0.7–4.0)
MCHC: 33.7 g/dL (ref 30.0–36.0)
MCV: 93 fl (ref 78.0–100.0)
Monocytes Absolute: 0.6 10*3/uL (ref 0.1–1.0)
Monocytes Relative: 9.4 % (ref 3.0–12.0)
Neutro Abs: 3.9 10*3/uL (ref 1.4–7.7)
Neutrophils Relative %: 60.6 % (ref 43.0–77.0)
Platelets: 216 10*3/uL (ref 150.0–400.0)
RBC: 4.24 Mil/uL (ref 3.87–5.11)
RDW: 13.9 % (ref 11.5–15.5)
WBC: 6.5 10*3/uL (ref 4.0–10.5)

## 2016-01-02 LAB — COMPREHENSIVE METABOLIC PANEL
ALT: 17 U/L (ref 0–35)
AST: 24 U/L (ref 0–37)
Albumin: 4.6 g/dL (ref 3.5–5.2)
Alkaline Phosphatase: 89 U/L (ref 39–117)
BUN: 14 mg/dL (ref 6–23)
CO2: 29 mEq/L (ref 19–32)
Calcium: 9.6 mg/dL (ref 8.4–10.5)
Chloride: 104 mEq/L (ref 96–112)
Creatinine, Ser: 0.86 mg/dL (ref 0.40–1.20)
GFR: 68.07 mL/min (ref >60.00)
Glucose, Bld: 95 mg/dL (ref 70–99)
Potassium: 4.6 mEq/L (ref 3.5–5.1)
Sodium: 140 mEq/L (ref 135–145)
Total Bilirubin: 0.7 mg/dL (ref 0.2–1.2)
Total Protein: 7.4 g/dL (ref 6.0–8.3)

## 2016-01-02 LAB — URINALYSIS, ROUTINE W REFLEX MICROSCOPIC
Bilirubin Urine: NEGATIVE
Hgb urine dipstick: NEGATIVE
Ketones, ur: NEGATIVE
Leukocytes, UA: NEGATIVE
Nitrite: NEGATIVE
RBC / HPF: NONE SEEN (ref 0–?)
Specific Gravity, Urine: 1.025 (ref 1.000–1.030)
Total Protein, Urine: NEGATIVE
Urine Glucose: NEGATIVE
Urobilinogen, UA: 0.2 (ref 0.0–1.0)
WBC, UA: NONE SEEN (ref 0–?)
pH: 5.5 (ref 5.0–8.0)

## 2016-01-02 LAB — LIPID PANEL
Cholesterol: 241 mg/dL — ABNORMAL HIGH (ref 0–200)
HDL: 100.1 mg/dL (ref >39.00)
LDL Cholesterol: 122 mg/dL — ABNORMAL HIGH (ref 0–99)
NonHDL: 141.37
Total CHOL/HDL Ratio: 2
Triglycerides: 97 mg/dL (ref 0.0–149.0)
VLDL: 19.4 mg/dL (ref 0.0–40.0)

## 2016-01-02 LAB — TSH: TSH: 1.16 u[IU]/mL (ref 0.35–4.50)

## 2016-01-02 MED ORDER — LOPERAMIDE HCL 2 MG PO CAPS: 2.0000 mg | ORAL_CAPSULE | Freq: Two times a day (BID) | ORAL | 11 refills | Status: DC | PRN

## 2016-01-02 NOTE — Progress Notes (Signed)
Subjective:  Patient ID: Karla Roman, female    DOB: 03-04-1941  Age: 75 y.o. MRN: [id number]  CC: Annual Exam; Hyperlipidemia; and Gastroesophageal Reflux   HPI Karla Roman presents for a CPX and AWV.  She wants for me to prescribe loperamide. She has a history of IBS with diarrhea. I recently prescribed fibers he which she tells me his help with her symptoms but it is too expensive. She states that she has been taking her husband's supply of loperamide and he provides symptom relief. She's had no recent episodes of abdominal pain or vomiting.  She tells me her acid reflux disease is well controlled, she has had no recent episodes of heartburn/odynophagia/dysphagia.  She tells me her asthma has been well controlled with a combination of a LABA/ICS inhaler. She's had no recent episodes of cough/wheezing/shortness of breath.   Past Medical History:  Diagnosis Date  . Allergic rhinitis   . Allergy   . Anemia    in childhood  . Arthritis   . Asthma   . Atrophic vaginitis   . Cataract   . Diverticulitis   . Diverticulosis   . Duodenal ulcer   . Dyspareunia   . Dysplastic nevus 09/2012, 10/2012   right thigh, severe atypia, resected 10/2012 by Dr Jarome Matin  . Esophagitis    grade 1  . Eustachian tube dysfunction   . GERD (gastroesophageal reflux disease)   . Hiatal hernia   . Hx of adenomatous colonic polyps 02/2009   Tubular adenoma in sigmoid. Diminutive.   . Hyperlipemia   . MVP (mitral valve prolapse)   . Osteopenia   . Schatzki's ring 1995  . Sleep apnea 10/2004   Sleep study by Dr Gwenette Greet. does not need cpap  . Vertigo    Past Surgical History:  Procedure Laterality Date  . ABDOMINAL HYSTERECTOMY  2001   with anterior and posterior vaginal repair  . APPENDECTOMY  1989   ruptured  . BREAST BIOPSY Right 1969   benign  . CERVICAL DISCECTOMY  01/2004   C3-4, C5-6, C6-7  . depuytren's contraction Right 04/2006  . dysplastic nevus  10/2012   right thigh    . ESOPHAGOGASTRODUODENOSCOPY N/A 06/04/2013   Procedure: ESOPHAGOGASTRODUODENOSCOPY (EGD);  Surgeon: Lafayette Dragon, MD;  Location: [facility] ENDOSCOPY;  Service: Endoscopy;  Laterality: N/A;  . LAPAROSCOPY  04/2000   with lysis of adhesions  . right shoulder fracture Right 03/2013   non-displaced, humeral head, non-operative mgt but she developed frozen shoulder.   . SEPTOPLASTY  [id number]  . SHOULDER SURGERY Left 01/2004   release of adhesive capsulitis  . TONSILLECTOMY  1947    reports that she has never smoked. She has never used smokeless tobacco. She reports that she does not drink alcohol or use drugs. family history includes Breast cancer in her maternal aunt; Cancer in her maternal aunt; Coronary artery disease in her brother, brother, father, and mother; Diabetes in her brother and brother; Heart attack in her father; Hypertension in her brother and sister; Hyperthyroidism in her sister; Irritable bowel syndrome in her sister. Allergies  Allergen Reactions  . Codeine Other (See Comments)    Anxiety, restlessness  . Levaquin [Levofloxacin]     Nausea, vomiting, diarrhea.  . Oxycodone Other (See Comments)    Altered mental status    Outpatient Medications Prior to Visit  Medication Sig Dispense Refill  . acetaminophen (TYLENOL) 500 MG tablet Take 500-1,000 mg by mouth every 6 (six) hours as needed  for mild pain or headache.    . Biotin 300 MCG TABS Take 1 tablet by mouth daily.     . budesonide-formoterol (SYMBICORT) 80-4.5 MCG/ACT inhaler Inhale 2 puffs into the lungs 2 (two) times daily. 1 Inhaler 11  . Calcium Carbonate (CALTRATE 600) 1500 MG TABS Take 1 tablet by mouth 2 (two) times daily. chewables    . Cholecalciferol (VITAMIN D-3 PO) Take 400 Units by mouth daily.    . cyanocobalamin 500 MCG tablet Take 500 mcg by mouth every morning.    Marland Kitchen omega-3 acid ethyl esters (LOVAZA) 1 G capsule Take 1 g by mouth every morning.    . pantoprazole (PROTONIX) 40 MG tablet TAKE 1 TABLET BY MOUTH   DAILY BEFORE BREAKFAST 90 tablet 1  . saccharomyces boulardii (FLORASTOR) 250 MG capsule Take 1 capsule (250 mg total) by mouth 2 (two) times daily. 60 capsule 0  . Eluxadoline (VIBERZI) 75 MG TABS Take 1 tablet by mouth 2 (two) times daily. 180 tablet 1  . Multiple Vitamins-Minerals (MULTIVITAMIN WITH MINERALS) tablet Take 1 tablet by mouth every morning. chewables    . Psyllium (METAMUCIL PO) Take 1 packet by mouth daily.     Marland Kitchen glucosamine-chondroitin 500-400 MG tablet Take 1 tablet by mouth 2 (two) times daily.    . pantoprazole (PROTONIX) 40 MG tablet Take 1 tablet daily before  breakfast 90 tablet 3  . vitamin C (ASCORBIC ACID) 250 MG tablet Take 250 mg by mouth 2 (two) times daily.    . vitamin E 400 UNIT capsule Take 400 Units by mouth every morning.      No facility-administered medications prior to visit.     ROS Review of Systems  Objective:  BP (!) 120/58 (BP Location: Left Arm, Patient Position: Sitting, Cuff Size: Normal)   Pulse 66   Temp 97.9 F (36.6 C) (Oral)   Ht 5\' 2"  (1.575 m)   Wt 124 lb 8 oz (56.5 kg)   SpO2 97%   BMI 22.77 kg/m   BP Readings from Last 3 Encounters:  01/02/16 (!) 120/58  11/20/15 137/62  10/10/15 128/70    Wt Readings from Last 3 Encounters:  01/02/16 124 lb 8 oz (56.5 kg)  11/20/15 120 lb (54.4 kg)  10/10/15 125 lb (56.7 kg)    Physical Exam  Constitutional: She is oriented to person, place, and time. No distress.  HENT:  Mouth/Throat: Oropharynx is clear and moist. No oropharyngeal exudate.  Eyes: Conjunctivae are normal. Right eye exhibits no discharge. Left eye exhibits no discharge. No scleral icterus.  Neck: Normal range of motion. Neck supple. No JVD present. No tracheal deviation present. No thyromegaly present.  Cardiovascular: Normal rate, regular rhythm, normal heart sounds and intact distal pulses.  Exam reveals no gallop and no friction rub.   No murmur heard. Pulmonary/Chest: Effort normal and breath sounds normal. No  stridor. No respiratory distress. She has no wheezes. She has no rales. She exhibits no tenderness.  Abdominal: Soft. Bowel sounds are normal. She exhibits no distension and no mass. There is no tenderness. There is no rebound and no guarding.  Genitourinary:  Genitourinary Comments: Breast, genitourinary, and rectal exams were deferred at her request  Musculoskeletal: Normal range of motion. She exhibits no edema, tenderness or deformity.  Lymphadenopathy:    She has no cervical adenopathy.  Neurological: She is oriented to person, place, and time.  Skin: Skin is warm and dry. No rash noted. She is not diaphoretic. No erythema. No pallor.  Psychiatric: She has a normal mood and affect. Her behavior is normal. Judgment and thought content normal.  Vitals reviewed.   Lab Results  Component Value Date   WBC 6.5 01/02/2016   HGB 13.3 01/02/2016   HCT 39.4 01/02/2016   PLT 216.0 01/02/2016   GLUCOSE 95 01/02/2016   CHOL 241 (H) 01/02/2016   TRIG 97.0 01/02/2016   HDL 100.10 01/02/2016   LDLDIRECT 115.1 12/23/2012   LDLCALC 122 (H) 01/02/2016   ALT 17 01/02/2016   AST 24 01/02/2016   NA 140 01/02/2016   K 4.6 01/02/2016   CL 104 01/02/2016   CREATININE 0.86 01/02/2016   BUN 14 01/02/2016   CO2 29 01/02/2016   TSH 1.16 01/02/2016    Dg Chest 2 View  Result Date: 11/20/2015 CLINICAL DATA:  Right side chest pain radiating into the right shoulder today. EXAM: CHEST  2 VIEW COMPARISON:  PA and lateral chest 09/05/2015 and 07/20/2013. FINDINGS: The lungs are clear. There is some biapical scarring, unchanged. No pneumothorax or pleural effusion. Heart size is normal. Aortic atherosclerosis is noted. No focal bony abnormality. IMPRESSION: No acute disease. Atherosclerosis. Electronically Signed   By: Inge Rise M.D.   On: 11/20/2015 14:23   Ct Angio Chest Aorta W/cm &/or Wo/cm  Result Date: 11/20/2015 CLINICAL DATA:  75 year old female with a history of right-sided chest pain  radiating to the shoulder EXAM: CT CHEST WITHOUT AND WITH CONTRAST TECHNIQUE: Multidetector CT imaging of the chest was performed following the standard protocol before and during bolus administration of intravenous contrast. CONTRAST:  85 cc Isovue 370 COMPARISON:  Chest CT 04/03/2011, abdominal CT 06/03/2013, chest x-ray 11/20/2015 FINDINGS: Chest: Nonvascular Unremarkable appearance of the superficial soft tissues. No axillary or supraclavicular adenopathy. Unremarkable appearance of the thoracic inlet. No mediastinal adenopathy. Unremarkable appearance of the thoracic esophagus. Small hiatal hernia. Linear opacity in the dependent aspects of the bilateral upper lobes with architectural distortion. This is unchanged from the comparison CT of 04/03/2011. Airways are clear. Partially calcified granuloma of the right middle lobe, unchanged from prior. No confluent airspace disease, pneumothorax, or pleural effusion. Vascular: Cardiac: Heart size within normal limits.  No pericardial fluid/thickening. Calcifications of the left main, left anterior descending, circumflex, right coronary arteries. Aorta: Noncontrast exam demonstrates no crescentic hyperdensity to suggest intramural hematoma. Normal course caliber and contour of the thoracic aorta. Diameter of the ascending aorta and descending aorta unremarkable. No dissection flap. No periaortic fluid or inflammatory changes. Scattered calcifications of the aortic arch. Three vessel arch. Patency of the proximal branch vessels maintained, with no occlusion or significant stenosis. No central, lobar, segmental, or proximal subsegmental filling defects of the pulmonary arterial tree. Upper abdomen: Unremarkable appearance of the nonvascular structures of the upper abdomen. Atherosclerotic changes of the aorta of the upper abdomen, with no acute finding identified. Musculoskeletal: Partially imaged surgical changes of the cervical region. No displaced fracture. Mild  degenerative changes of the thoracic spine. No significant bony canal narrowing IMPRESSION: No acute finding of the chest CT to account for the patient's symptoms. No evidence of acute aortic syndrome. Left main and 3 vessel coronary artery disease. Office based assessment may be considered. Small hiatal hernia. Aortic atherosclerosis. Signed, Dulcy Fanny. Earleen Newport, DO [facility] Electronically Signed   By: Corrie Mckusick D.O.   On: 11/20/2015 16:52    Assessment & Plan:   Addis was seen today for annual exam, hyperlipidemia and gastroesophageal reflux.  Diagnoses and all orders  for this visit:  Gastroesophageal reflux disease without esophagitis- her symptoms are well controlled with a PPI, no complications noted. -     CBC with Differential/Platelet; Future  Hyperlipidemia with target LDL less than 130- her Framingham risk score is only 6% so I do not recommend that she start a statin for risk reduction -     Lipid panel; Future -     Comprehensive metabolic panel; Future -     TSH; Future  Renal cyst, right- she has no pain or hematuria related to this, her renal function and urinalysis are normal, this is consistent with a benign uncomplicated renal cyst. -     Comprehensive metabolic panel; Future -     Urinalysis, Routine w reflex microscopic (not at [facility]); Future  Routine general medical examination at a health care facility  IBS (irritable bowel syndrome)- will change Viberzi to loperamide at her request -     loperamide (IMODIUM) 2 MG capsule; Take 1 capsule (2 mg total) by mouth 2 (two) times daily as needed for diarrhea or loose stools.   I have discontinued Ms. Monjaraz's multivitamin with minerals, vitamin E, Psyllium (METAMUCIL PO), vitamin C, glucosamine-chondroitin, and Eluxadoline. I am also having her start on loperamide. Additionally, I am having her maintain her calcium carbonate, acetaminophen, cyanocobalamin,  omega-3 acid ethyl esters, saccharomyces boulardii, Biotin, Cholecalciferol (VITAMIN D-3 PO), pantoprazole, and budesonide-formoterol.  Meds ordered this encounter  Medications  . loperamide (IMODIUM) 2 MG capsule    Sig: Take 1 capsule (2 mg total) by mouth 2 (two) times daily as needed for diarrhea or loose stools.    Dispense:  60 capsule    Refill:  11   See AVS for instructions about healthy living and anticipatory guidance.  Follow-up: Return in about 6 months (around 07/04/2016).  Scarlette Calico, MD

## 2016-01-02 NOTE — Assessment & Plan Note (Signed)

## 2016-01-02 NOTE — Patient Instructions (Signed)
Preventive Care for Adults, Female A healthy lifestyle and preventive care can promote health and wellness. Preventive health guidelines for women include the following key practices.  A routine yearly physical is a good way to check with your health care provider about your health and preventive screening. It is a chance to share any concerns and updates on your health and to receive a thorough exam.  Visit your dentist for a routine exam and preventive care every 6 months. Brush your teeth twice a day and floss once a day. Good oral hygiene prevents tooth decay and gum disease.  The frequency of eye exams is based on your age, health, family medical history, use of contact lenses, and other factors. Follow your health care provider's recommendations for frequency of eye exams.  Eat a healthy diet. Foods like vegetables, fruits, whole grains, low-fat dairy products, and lean protein foods contain the nutrients you need without too many calories. Decrease your intake of foods high in solid fats, added sugars, and salt. Eat the right amount of calories for you.Get information about a proper diet from your health care provider, if necessary.  Regular physical exercise is one of the most important things you can do for your health. Most adults should get at least 150 minutes of moderate-intensity exercise (any activity that increases your heart rate and causes you to sweat) each week. In addition, most adults need muscle-strengthening exercises on 2 or more days a week.  Maintain a healthy weight. The body mass index (BMI) is a screening tool to identify possible weight problems. It provides an estimate of body fat based on height and weight. Your health care provider can find your BMI and can help you achieve or maintain a healthy weight.For adults 20 years and older:  A BMI below 18.5 is considered underweight.  A BMI of 18.5 to 24.9 is normal.  A BMI of 25 to 29.9 is considered overweight.  A  BMI of 30 and above is considered obese.  Maintain normal blood lipids and cholesterol levels by exercising and minimizing your intake of saturated fat. Eat a balanced diet with plenty of fruit and vegetables. Blood tests for lipids and cholesterol should begin at age 45 and be repeated every 5 years. If your lipid or cholesterol levels are high, you are over 50, or you are at high risk for heart disease, you may need your cholesterol levels checked more frequently.Ongoing high lipid and cholesterol levels should be treated with medicines if diet and exercise are not working.  If you smoke, find out from your health care provider how to quit. If you do not use tobacco, do not start.  Lung cancer screening is recommended for adults aged 45-80 years who are at high risk for developing lung cancer because of a history of smoking. A yearly low-dose CT scan of the lungs is recommended for people who have at least a 30-pack-year history of smoking and are a current smoker or have quit within the past 15 years. A pack year of smoking is smoking an average of 1 pack of cigarettes a day for 1 year (for example: 1 pack a day for 30 years or 2 packs a day for 15 years). Yearly screening should continue until the smoker has stopped smoking for at least 15 years. Yearly screening should be stopped for people who develop a health problem that would prevent them from having lung cancer treatment.  If you are pregnant, do not drink alcohol. If you are  breastfeeding, be very cautious about drinking alcohol. If you are not pregnant and choose to drink alcohol, do not have more than 1 drink per day. One drink is considered to be 12 ounces (355 mL) of beer, 5 ounces (148 mL) of wine, or 1.5 ounces (44 mL) of liquor.  Avoid use of street drugs. Do not share needles with anyone. Ask for help if you need support or instructions about stopping the use of drugs.  High blood pressure causes heart disease and increases the risk  of stroke. Your blood pressure should be checked at least every 1 to 2 years. Ongoing high blood pressure should be treated with medicines if weight loss and exercise do not work.  If you are 55-79 years old, ask your health care provider if you should take aspirin to prevent strokes.  Diabetes screening is done by taking a blood sample to check your blood glucose level after you have not eaten for a certain period of time (fasting). If you are not overweight and you do not have risk factors for diabetes, you should be screened once every 3 years starting at age 45. If you are overweight or obese and you are 40-70 years of age, you should be screened for diabetes every year as part of your cardiovascular risk assessment.  Breast cancer screening is essential preventive care for women. You should practice "breast self-awareness." This means understanding the normal appearance and feel of your breasts and may include breast self-examination. Any changes detected, no matter how small, should be reported to a health care provider. Women in their 20s and 30s should have a clinical breast exam (CBE) by a health care provider as part of a regular health exam every 1 to 3 years. After age 40, women should have a CBE every year. Starting at age 40, women should consider having a mammogram (breast X-ray test) every year. Women who have a family history of breast cancer should talk to their health care provider about genetic screening. Women at a high risk of breast cancer should talk to their health care providers about having an MRI and a mammogram every year.  Breast cancer gene (BRCA)-related cancer risk assessment is recommended for women who have family members with BRCA-related cancers. BRCA-related cancers include breast, ovarian, tubal, and peritoneal cancers. Having family members with these cancers may be associated with an increased risk for harmful changes (mutations) in the breast cancer genes BRCA1 and  BRCA2. Results of the assessment will determine the need for genetic counseling and BRCA1 and BRCA2 testing.  Your health care provider may recommend that you be screened regularly for cancer of the pelvic organs (ovaries, uterus, and vagina). This screening involves a pelvic examination, including checking for microscopic changes to the surface of your cervix (Pap test). You may be encouraged to have this screening done every 3 years, beginning at age 21.  For women ages 30-65, health care providers may recommend pelvic exams and Pap testing every 3 years, or they may recommend the Pap and pelvic exam, combined with testing for human papilloma virus (HPV), every 5 years. Some types of HPV increase your risk of cervical cancer. Testing for HPV may also be done on women of any age with unclear Pap test results.  Other health care providers may not recommend any screening for nonpregnant women who are considered low risk for pelvic cancer and who do not have symptoms. Ask your health care provider if a screening pelvic exam is right for   you.  If you have had past treatment for cervical cancer or a condition that could lead to cancer, you need Pap tests and screening for cancer for at least 20 years after your treatment. If Pap tests have been discontinued, your risk factors (such as having a new sexual partner) need to be reassessed to determine if screening should resume. Some women have medical problems that increase the chance of getting cervical cancer. In these cases, your health care provider may recommend more frequent screening and Pap tests.  Colorectal cancer can be detected and often prevented. Most routine colorectal cancer screening begins at the age of 50 years and continues through age 75 years. However, your health care provider may recommend screening at an earlier age if you have risk factors for colon cancer. On a yearly basis, your health care provider may provide home test kits to check  for hidden blood in the stool. Use of a small camera at the end of a tube, to directly examine the colon (sigmoidoscopy or colonoscopy), can detect the earliest forms of colorectal cancer. Talk to your health care provider about this at age 50, when routine screening begins. Direct exam of the colon should be repeated every 5-10 years through age 75 years, unless early forms of precancerous polyps or small growths are found.  People who are at an increased risk for hepatitis B should be screened for this virus. You are considered at high risk for hepatitis B if:  You were born in a country where hepatitis B occurs often. Talk with your health care provider about which countries are considered high risk.  Your parents were born in a high-risk country and you have not received a shot to protect against hepatitis B (hepatitis B vaccine).  You have HIV or AIDS.  You use needles to inject street drugs.  You live with, or have sex with, someone who has hepatitis B.  You get hemodialysis treatment.  You take certain medicines for conditions like cancer, organ transplantation, and autoimmune conditions.  Hepatitis C blood testing is recommended for all people born from 1945 through 1965 and any individual with known risks for hepatitis C.  Practice safe sex. Use condoms and avoid high-risk sexual practices to reduce the spread of sexually transmitted infections (STIs). STIs include gonorrhea, chlamydia, syphilis, trichomonas, herpes, HPV, and human immunodeficiency virus (HIV). Herpes, HIV, and HPV are viral illnesses that have no cure. They can result in disability, cancer, and death.  You should be screened for sexually transmitted illnesses (STIs) including gonorrhea and chlamydia if:  You are sexually active and are younger than 24 years.  You are older than 24 years and your health care provider tells you that you are at risk for this type of infection.  Your sexual activity has changed  since you were last screened and you are at an increased risk for chlamydia or gonorrhea. Ask your health care provider if you are at risk.  If you are at risk of being infected with HIV, it is recommended that you take a prescription medicine daily to prevent HIV infection. This is called preexposure prophylaxis (PrEP). You are considered at risk if:  You are sexually active and do not regularly use condoms or know the HIV status of your partner(s).  You take drugs by injection.  You are sexually active with a partner who has HIV.  Talk with your health care provider about whether you are at high risk of being infected with HIV. If   you choose to begin PrEP, you should first be tested for HIV. You should then be tested every 3 months for as long as you are taking PrEP.  Osteoporosis is a disease in which the bones lose minerals and strength with aging. This can result in serious bone fractures or breaks. The risk of osteoporosis can be identified using a bone density scan. Women ages 67 years and over and women at risk for fractures or osteoporosis should discuss screening with their health care providers. Ask your health care provider whether you should take a calcium supplement or vitamin D to reduce the rate of osteoporosis.  Menopause can be associated with physical symptoms and risks. Hormone replacement therapy is available to decrease symptoms and risks. You should talk to your health care provider about whether hormone replacement therapy is right for you.  Use sunscreen. Apply sunscreen liberally and repeatedly throughout the day. You should seek shade when your shadow is shorter than you. Protect yourself by wearing long sleeves, pants, a wide-brimmed hat, and sunglasses year round, whenever you are outdoors.  Once a month, do a whole body skin exam, using a mirror to look at the skin on your back. Tell your health care provider of new moles, moles that have irregular borders, moles that  are larger than a pencil eraser, or moles that have changed in shape or color.  Stay current with required vaccines (immunizations).  Influenza vaccine. All adults should be immunized every year.  Tetanus, diphtheria, and acellular pertussis (Td, Tdap) vaccine. Pregnant women should receive 1 dose of Tdap vaccine during each pregnancy. The dose should be obtained regardless of the length of time since the last dose. Immunization is preferred during the 27th-36th week of gestation. An adult who has not previously received Tdap or who does not know her vaccine status should receive 1 dose of Tdap. This initial dose should be followed by tetanus and diphtheria toxoids (Td) booster doses every 10 years. Adults with an unknown or incomplete history of completing a 3-dose immunization series with Td-containing vaccines should begin or complete a primary immunization series including a Tdap dose. Adults should receive a Td booster every 10 years.  Varicella vaccine. An adult without evidence of immunity to varicella should receive 2 doses or a second dose if she has previously received 1 dose. Pregnant females who do not have evidence of immunity should receive the first dose after pregnancy. This first dose should be obtained before leaving the health care facility. The second dose should be obtained 4-8 weeks after the first dose.  Human papillomavirus (HPV) vaccine. Females aged 13-26 years who have not received the vaccine previously should obtain the 3-dose series. The vaccine is not recommended for use in pregnant females. However, pregnancy testing is not needed before receiving a dose. If a female is found to be pregnant after receiving a dose, no treatment is needed. In that case, the remaining doses should be delayed until after the pregnancy. Immunization is recommended for any person with an immunocompromised condition through the age of 61 years if she did not get any or all doses earlier. During the  3-dose series, the second dose should be obtained 4-8 weeks after the first dose. The third dose should be obtained 24 weeks after the first dose and 16 weeks after the second dose.  Zoster vaccine. One dose is recommended for adults aged 30 years or older unless certain conditions are present.  Measles, mumps, and rubella (MMR) vaccine. Adults born  before 1957 generally are considered immune to measles and mumps. Adults born in 1957 or later should have 1 or more doses of MMR vaccine unless there is a contraindication to the vaccine or there is laboratory evidence of immunity to each of the three diseases. A routine second dose of MMR vaccine should be obtained at least 28 days after the first dose for students attending postsecondary schools, health care workers, or international travelers. People who received inactivated measles vaccine or an unknown type of measles vaccine during 1963-1967 should receive 2 doses of MMR vaccine. People who received inactivated mumps vaccine or an unknown type of mumps vaccine before 1979 and are at high risk for mumps infection should consider immunization with 2 doses of MMR vaccine. For females of childbearing age, rubella immunity should be determined. If there is no evidence of immunity, females who are not pregnant should be vaccinated. If there is no evidence of immunity, females who are pregnant should delay immunization until after pregnancy. Unvaccinated health care workers born before 1957 who lack laboratory evidence of measles, mumps, or rubella immunity or laboratory confirmation of disease should consider measles and mumps immunization with 2 doses of MMR vaccine or rubella immunization with 1 dose of MMR vaccine.  Pneumococcal 13-valent conjugate (PCV13) vaccine. When indicated, a person who is uncertain of his immunization history and has no record of immunization should receive the PCV13 vaccine. All adults 65 years of age and older should receive this  vaccine. An adult aged 19 years or older who has certain medical conditions and has not been previously immunized should receive 1 dose of PCV13 vaccine. This PCV13 should be followed with a dose of pneumococcal polysaccharide (PPSV23) vaccine. Adults who are at high risk for pneumococcal disease should obtain the PPSV23 vaccine at least 8 weeks after the dose of PCV13 vaccine. Adults older than 76 years of age who have normal immune system function should obtain the PPSV23 vaccine dose at least 1 year after the dose of PCV13 vaccine.  Pneumococcal polysaccharide (PPSV23) vaccine. When PCV13 is also indicated, PCV13 should be obtained first. All adults aged 65 years and older should be immunized. An adult younger than age 65 years who has certain medical conditions should be immunized. Any person who resides in a nursing home or long-term care facility should be immunized. An adult smoker should be immunized. People with an immunocompromised condition and certain other conditions should receive both PCV13 and PPSV23 vaccines. People with human immunodeficiency virus (HIV) infection should be immunized as soon as possible after diagnosis. Immunization during chemotherapy or radiation therapy should be avoided. Routine use of PPSV23 vaccine is not recommended for American Indians, [place] Natives, or people younger than 65 years unless there are medical conditions that require PPSV23 vaccine. When indicated, people who have unknown immunization and have no record of immunization should receive PPSV23 vaccine. One-time revaccination 5 years after the first dose of PPSV23 is recommended for people aged 19-64 years who have chronic kidney failure, nephrotic syndrome, asplenia, or immunocompromised conditions. People who received 1-2 doses of PPSV23 before age 65 years should receive another dose of PPSV23 vaccine at age 65 years or later if at least 5 years have passed since the previous dose. Doses of PPSV23 are not  needed for people immunized with PPSV23 at or after age 65 years.  Meningococcal vaccine. Adults with asplenia or persistent complement component deficiencies should receive 2 doses of quadrivalent meningococcal conjugate (MenACWY-D) vaccine. The doses should be obtained   at least 2 months apart. Microbiologists working with certain meningococcal bacteria, [place] recruits, people at risk during an outbreak, and people who travel to or live in countries with a high rate of meningitis should be immunized. A first-year college student up through age 34 years who is living in a residence hall should receive a dose if she did not receive a dose on or after her 16th birthday. Adults who have certain high-risk conditions should receive one or more doses of vaccine.  Hepatitis A vaccine. Adults who wish to be protected from this disease, have certain high-risk conditions, work with hepatitis A-infected animals, work in hepatitis A research labs, or travel to or work in countries with a high rate of hepatitis A should be immunized. Adults who were previously unvaccinated and who anticipate close contact with an international adoptee during the first 60 days after arrival in the [place] from a country with a high rate of hepatitis A should be immunized.  Hepatitis B vaccine. Adults who wish to be protected from this disease, have certain high-risk conditions, may be exposed to blood or other infectious body fluids, are household contacts or sex partners of hepatitis B positive people, are clients or workers in certain care facilities, or travel to or work in countries with a high rate of hepatitis B should be immunized.  Haemophilus influenzae type b (Hib) vaccine. A previously unvaccinated person with asplenia or sickle cell disease or having a scheduled splenectomy should receive 1 dose of Hib vaccine. Regardless of previous immunization, a recipient of a hematopoietic stem cell transplant should receive a  3-dose series 6-12 months after her successful transplant. Hib vaccine is not recommended for adults with HIV infection. Preventive Services / Frequency Ages 35 to 4 years  Blood pressure check.** / Every 3-5 years.  Lipid and cholesterol check.** / Every 5 years beginning at age 60.  Clinical breast exam.** / Every 3 years for women in their 71s and 10s.  BRCA-related cancer risk assessment.** / For women who have family members with a BRCA-related cancer (breast, ovarian, tubal, or peritoneal cancers).  Pap test.** / Every 2 years from ages 76 through 26. Every 3 years starting at age 61 through age 76 or 93 with a history of 3 consecutive normal Pap tests.  HPV screening.** / Every 3 years from ages 37 through ages 60 to 51 with a history of 3 consecutive normal Pap tests.  Hepatitis C blood test.** / For any individual with known risks for hepatitis C.  Skin self-exam. / Monthly.  Influenza vaccine. / Every year.  Tetanus, diphtheria, and acellular pertussis (Tdap, Td) vaccine.** / Consult your health care provider. Pregnant women should receive 1 dose of Tdap vaccine during each pregnancy. 1 dose of Td every 10 years.  Varicella vaccine.** / Consult your health care provider. Pregnant females who do not have evidence of immunity should receive the first dose after pregnancy.  HPV vaccine. / 3 doses over 6 months, if 93 and younger. The vaccine is not recommended for use in pregnant females. However, pregnancy testing is not needed before receiving a dose.  Measles, mumps, rubella (MMR) vaccine.** / You need at least 1 dose of MMR if you were born in 1957 or later. You may also need a 2nd dose. For females of childbearing age, rubella immunity should be determined. If there is no evidence of immunity, females who are not pregnant should be vaccinated. If there is no evidence of immunity, females who are  pregnant should delay immunization until after pregnancy.  Pneumococcal  13-valent conjugate (PCV13) vaccine.** / Consult your health care provider.  Pneumococcal polysaccharide (PPSV23) vaccine.** / 1 to 2 doses if you smoke cigarettes or if you have certain conditions.  Meningococcal vaccine.** / 1 dose if you are age 68 to 8 years and a Market researcher living in a residence hall, or have one of several medical conditions, you need to get vaccinated against meningococcal disease. You may also need additional booster doses.  Hepatitis A vaccine.** / Consult your health care provider.  Hepatitis B vaccine.** / Consult your health care provider.  Haemophilus influenzae type b (Hib) vaccine.** / Consult your health care provider. Ages 7 to 53 years  Blood pressure check.** / Every year.  Lipid and cholesterol check.** / Every 5 years beginning at age 25 years.  Lung cancer screening. / Every year if you are aged 11-80 years and have a 30-pack-year history of smoking and currently smoke or have quit within the past 15 years. Yearly screening is stopped once you have quit smoking for at least 15 years or develop a health problem that would prevent you from having lung cancer treatment.  Clinical breast exam.** / Every year after age 48 years.  BRCA-related cancer risk assessment.** / For women who have family members with a BRCA-related cancer (breast, ovarian, tubal, or peritoneal cancers).  Mammogram.** / Every year beginning at age 41 years and continuing for as long as you are in good health. Consult with your health care provider.  Pap test.** / Every 3 years starting at age 65 years through age 37 or 70 years with a history of 3 consecutive normal Pap tests.  HPV screening.** / Every 3 years from ages 72 years through ages 60 to 40 years with a history of 3 consecutive normal Pap tests.  Fecal occult blood test (FOBT) of stool. / Every year beginning at age 21 years and continuing until age 5 years. You may not need to do this test if you get  a colonoscopy every 10 years.  Flexible sigmoidoscopy or colonoscopy.** / Every 5 years for a flexible sigmoidoscopy or every 10 years for a colonoscopy beginning at age 35 years and continuing until age 48 years.  Hepatitis C blood test.** / For all people born from [birth year] through 1965 and any individual with known risks for hepatitis C.  Skin self-exam. / Monthly.  Influenza vaccine. / Every year.  Tetanus, diphtheria, and acellular pertussis (Tdap/Td) vaccine.** / Consult your health care provider. Pregnant women should receive 1 dose of Tdap vaccine during each pregnancy. 1 dose of Td every 10 years.  Varicella vaccine.** / Consult your health care provider. Pregnant females who do not have evidence of immunity should receive the first dose after pregnancy.  Zoster vaccine.** / 1 dose for adults aged 30 years or older.  Measles, mumps, rubella (MMR) vaccine.** / You need at least 1 dose of MMR if you were born in 1957 or later. You may also need a second dose. For females of childbearing age, rubella immunity should be determined. If there is no evidence of immunity, females who are not pregnant should be vaccinated. If there is no evidence of immunity, females who are pregnant should delay immunization until after pregnancy.  Pneumococcal 13-valent conjugate (PCV13) vaccine.** / Consult your health care provider.  Pneumococcal polysaccharide (PPSV23) vaccine.** / 1 to 2 doses if you smoke cigarettes or if you have certain conditions.  Meningococcal vaccine.** /  Consult your health care provider.  Hepatitis A vaccine.** / Consult your health care provider.  Hepatitis B vaccine.** / Consult your health care provider.  Haemophilus influenzae type b (Hib) vaccine.** / Consult your health care provider. Ages 64 years and over  Blood pressure check.** / Every year.  Lipid and cholesterol check.** / Every 5 years beginning at age 23 years.  Lung cancer screening. / Every year if you  are aged 16-80 years and have a 30-pack-year history of smoking and currently smoke or have quit within the past 15 years. Yearly screening is stopped once you have quit smoking for at least 15 years or develop a health problem that would prevent you from having lung cancer treatment.  Clinical breast exam.** / Every year after age 74 years.  BRCA-related cancer risk assessment.** / For women who have family members with a BRCA-related cancer (breast, ovarian, tubal, or peritoneal cancers).  Mammogram.** / Every year beginning at age 44 years and continuing for as long as you are in good health. Consult with your health care provider.  Pap test.** / Every 3 years starting at age 58 years through age 22 or 39 years with 3 consecutive normal Pap tests. Testing can be stopped between 65 and 70 years with 3 consecutive normal Pap tests and no abnormal Pap or HPV tests in the past 10 years.  HPV screening.** / Every 3 years from ages 64 years through ages 70 or 61 years with a history of 3 consecutive normal Pap tests. Testing can be stopped between 65 and 70 years with 3 consecutive normal Pap tests and no abnormal Pap or HPV tests in the past 10 years.  Fecal occult blood test (FOBT) of stool. / Every year beginning at age 40 years and continuing until age 27 years. You may not need to do this test if you get a colonoscopy every 10 years.  Flexible sigmoidoscopy or colonoscopy.** / Every 5 years for a flexible sigmoidoscopy or every 10 years for a colonoscopy beginning at age 7 years and continuing until age 32 years.  Hepatitis C blood test.** / For all people born from [birth year] through 1966 and any individual with known risks for hepatitis C.  Osteoporosis screening.** / A one-time screening for women ages 30 years and over and women at risk for fractures or osteoporosis.  Skin self-exam. / Monthly.  Influenza vaccine. / Every year.  Tetanus, diphtheria, and acellular pertussis (Tdap/Td)  vaccine.** / 1 dose of Td every 10 years.  Varicella vaccine.** / Consult your health care provider.  Zoster vaccine.** / 1 dose for adults aged 35 years or older.  Pneumococcal 13-valent conjugate (PCV13) vaccine.** / Consult your health care provider.  Pneumococcal polysaccharide (PPSV23) vaccine.** / 1 dose for all adults aged 46 years and older.  Meningococcal vaccine.** / Consult your health care provider.  Hepatitis A vaccine.** / Consult your health care provider.  Hepatitis B vaccine.** / Consult your health care provider.  Haemophilus influenzae type b (Hib) vaccine.** / Consult your health care provider. ** Family history and personal history of risk and conditions may change your health care provider's recommendations.   This information is not intended to replace advice given to you by your health care provider. Make sure you discuss any questions you have with your health care provider.   Document Released: 06/25/2001 Document Revised: 05/20/2014 Document Reviewed: 09/24/2010 Elsevier Interactive Patient Education [employer].

## 2016-01-02 NOTE — Progress Notes (Signed)
Pre visit review using our clinic review tool, if applicable. No additional management support is needed unless otherwise documented below in the visit note. 

## 2016-01-08 ENCOUNTER — Ambulatory Visit (INDEPENDENT_AMBULATORY_CARE_PROVIDER_SITE_OTHER): Payer: Medicare Other | Admitting: Cardiovascular Disease

## 2016-01-08 ENCOUNTER — Encounter: Payer: Self-pay | Admitting: Cardiovascular Disease

## 2016-01-08 VITALS — BP 128/68 | HR 75 | Ht 62.0 in | Wt 125.8 lb

## 2016-01-08 DIAGNOSIS — R0602 Shortness of breath: Secondary | ICD-10-CM

## 2016-01-08 DIAGNOSIS — E785 Hyperlipidemia, unspecified: Secondary | ICD-10-CM

## 2016-01-08 DIAGNOSIS — R0789 Other chest pain: Secondary | ICD-10-CM | POA: Diagnosis not present

## 2016-01-08 NOTE — Patient Instructions (Addendum)
Medication Instructions:  Your physician recommends that you continue on your current medications as directed. Please refer to the Current Medication list given to you today.   Labwork: none  Testing/Procedures: Your physician has requested that you have an echocardiogram. Echocardiography is a painless test that uses sound waves to create images of your heart. It provides your doctor with information about the size and shape of your heart and how well your heart's chambers and valves are working. This procedure takes approximately one hour. There are no restrictions for this procedure.    Follow-Up: Your physician recommends that you schedule a follow-up appointment as needed.    Any Other Special Instructions Will Be Listed Below (If Applicable).     If you need a refill on your cardiac medications before your next appointment, please call your pharmacy.  Echocardiogram An echocardiogram, or echocardiography, uses sound waves (ultrasound) to produce an image of your heart. The echocardiogram is simple, painless, obtained within a short period of time, and offers valuable information to your health care provider. The images from an echocardiogram can provide information such as:  Evidence of coronary artery disease (CAD).  Heart size.  Heart muscle function.  Heart valve function.  Aneurysm detection.  Evidence of a past heart attack.  Fluid buildup around the heart.  Heart muscle thickening.  Assess heart valve function. LET YOUR HEALTH CARE PROVIDER KNOW ABOUT:  Any allergies you have.  All medicines you are taking, including vitamins, herbs, eye drops, creams, and over-the-counter medicines.  Previous problems you or members of your family have had with the use of anesthetics.  Any blood disorders you have.  Previous surgeries you have had.  Medical conditions you have.  Possibility of pregnancy, if this applies. BEFORE THE PROCEDURE  No special  preparation is needed. Eat and drink normally.  PROCEDURE   In order to produce an image of your heart, gel will be applied to your chest and a wand-like tool (transducer) will be moved over your chest. The gel will help transmit the sound waves from the transducer. The sound waves will harmlessly bounce off your heart to allow the heart images to be captured in real-time motion. These images will then be recorded.  You may need an IV to receive a medicine that improves the quality of the pictures. AFTER THE PROCEDURE You may return to your normal schedule including diet, activities, and medicines, unless your health care provider tells you otherwise.   This information is not intended to replace advice given to you by your health care provider. Make sure you discuss any questions you have with your health care provider.   Document Released: 04/26/2000 Document Revised: 05/20/2014 Document Reviewed: 01/04/2013 Elsevier Interactive Patient Education 2016 Elsevier Inc.  

## 2016-01-08 NOTE — Progress Notes (Signed)
Cardiology Office Note   Date:  01/08/2016   ID:  Karla Roman, DOB 1940/07/20, MRN [id number]  PCP:  Scarlette Calico, MD  Cardiologist:  New   No chief complaint on file.     History of Present Illness: Karla Roman is a 75 y.o. female who was referred from [facility] ED for evaluation of chest pain. She reports prolonged history of mitral valve prolapse with no recent echocardiographic evaluation. She has known history of GERD, irritable bowel syndrome and hyperlipidemia. No history of diabetes or hypertension. She is a lifelong nonsmoker. She has family history of coronary artery disease but not prematurely.  She presented to [facility] ED on July 10th with right sided chest pain. The pain was described as aching. Labs were unremarkable including Troponin. CTA chest showed no PE or aortic dissection. There were aortic and 3 vessel coronary artery atherosclerosis.  She had only one mild brief episode since that time with no recurrent symptoms. She does complain of exertional dyspnea with mild to moderate activities. No orthopnea, PND or leg edema.   Past Medical History:  Diagnosis Date  . Allergic rhinitis   . Allergy   . Anemia    in childhood  . Arthritis   . Asthma   . Atrophic vaginitis   . Cataract   . Diverticulitis   . Diverticulosis   . Duodenal ulcer   . Dyspareunia   . Dysplastic nevus 09/2012, 10/2012   right thigh, severe atypia, resected 10/2012 by Dr Jarome Matin  . Esophagitis    grade 1  . Eustachian tube dysfunction   . GERD (gastroesophageal reflux disease)   . Hiatal hernia   . Hx of adenomatous colonic polyps 02/2009   Tubular adenoma in sigmoid. Diminutive.   . Hyperlipemia   . MVP (mitral valve prolapse)   . Osteopenia   . Schatzki's ring 1995  . Sleep apnea 10/2004   Sleep study by Dr Gwenette Greet. does not need cpap  . Vertigo     Past Surgical History:  Procedure Laterality Date  . ABDOMINAL HYSTERECTOMY  2001   with anterior and posterior  vaginal repair  . APPENDECTOMY  1989   ruptured  . BREAST BIOPSY Right 1969   benign  . CERVICAL DISCECTOMY  01/2004   C3-4, C5-6, C6-7  . depuytren's contraction Right 04/2006  . dysplastic nevus  10/2012   right thigh  . ESOPHAGOGASTRODUODENOSCOPY N/A 06/04/2013   Procedure: ESOPHAGOGASTRODUODENOSCOPY (EGD);  Surgeon: Lafayette Dragon, MD;  Location: [facility] ENDOSCOPY;  Service: Endoscopy;  Laterality: N/A;  . LAPAROSCOPY  04/2000   with lysis of adhesions  . right shoulder fracture Right 03/2013   non-displaced, humeral head, non-operative mgt but she developed frozen shoulder.   . SEPTOPLASTY  [id number]  . SHOULDER SURGERY Left 01/2004   release of adhesive capsulitis  . TONSILLECTOMY  1947     Current Outpatient Prescriptions  Medication Sig Dispense Refill  . acetaminophen (TYLENOL) 500 MG tablet Take 500-1,000 mg by mouth every 6 (six) hours as needed for mild pain or headache.    . Biotin 300 MCG TABS Take 1 tablet by mouth daily.     . budesonide-formoterol (SYMBICORT) 80-4.5 MCG/ACT inhaler Inhale 2 puffs into the lungs 2 (two) times daily. 1 Inhaler 11  . Calcium Carbonate (CALTRATE 600) 1500 MG TABS Take 1 tablet by mouth 2 (two) times daily. chewables    . Cholecalciferol (VITAMIN D-3 PO) Take 400 Units by mouth daily.    Marland Kitchen  cyanocobalamin 500 MCG tablet Take 500 mcg by mouth every morning.    . loperamide (IMODIUM) 2 MG capsule Take 1 capsule (2 mg total) by mouth 2 (two) times daily as needed for diarrhea or loose stools. 60 capsule 11  . omega-3 acid ethyl esters (LOVAZA) 1 G capsule Take 1 g by mouth every morning.    . pantoprazole (PROTONIX) 40 MG tablet TAKE 1 TABLET BY MOUTH  DAILY BEFORE BREAKFAST 90 tablet 1  . saccharomyces boulardii (FLORASTOR) 250 MG capsule Take 1 capsule (250 mg total) by mouth 2 (two) times daily. 60 capsule 0   No current facility-administered medications for this visit.     Allergies:   Codeine; Levaquin [levofloxacin]; and Oxycodone    Social  History:  The patient  reports that she has never smoked. She has never used smokeless tobacco. She reports that she does not drink alcohol or use drugs.   Family History:  The patient's family history includes Breast cancer in her maternal aunt; Cancer in her maternal aunt; Coronary artery disease in her brother, brother, father, and mother; Diabetes in her brother and brother; Heart attack in her father; Hypertension in her brother and sister; Hyperthyroidism in her sister; Irritable bowel syndrome in her sister.    ROS:  Please see the history of present illness.   Otherwise, review of systems are positive for none.   All other systems are reviewed and negative.    PHYSICAL EXAM: VS:  BP 128/68   Pulse 75   Ht 5\' 2"  (1.575 m)   Wt 125 lb 12.8 oz (57.1 kg)   SpO2 97%   BMI 23.01 kg/m  , BMI Body mass index is 23.01 kg/m. GEN: Well nourished, well developed, in no acute distress  HEENT: normal  Neck: no JVD, carotid bruits, or masses Cardiac: RRR; no murmurs, rubs, or gallops,no edema  Respiratory:  clear to auscultation bilaterally, normal work of breathing GI: soft, nontender, nondistended, + BS MS: no deformity or atrophy  Skin: warm and dry, no rash Neuro:  Strength and sensation are intact Psych: euthymic mood, full affect   EKG:  EKG is not ordered today. EKG from emergency room was reviewed which showed normal sinus rhythm with no significant ST or T wave changes.   Recent Labs: 01/02/2016: ALT 17; BUN 14; Creatinine, Ser 0.86; Hemoglobin 13.3; Platelets 216.0; Potassium 4.6; Sodium 140; TSH 1.16    Lipid Panel    Component Value Date/Time   CHOL 241 (H) 01/02/2016 1024   TRIG 97.0 01/02/2016 1024   HDL 100.10 01/02/2016 1024   CHOLHDL 2 01/02/2016 1024   VLDL 19.4 01/02/2016 1024   LDLCALC 122 (H) 01/02/2016 1024   LDLDIRECT 115.1 12/23/2012 1015      Wt Readings from Last 3 Encounters:  01/08/16 125 lb 12.8 oz (57.1 kg)  01/02/16 124 lb 8 oz (56.5 kg)    11/20/15 120 lb (54.4 kg)      No flowsheet data found.    ASSESSMENT AND PLAN:  1.  Atypical chest pain: Doesn't seem to be cardiac in etiology and it was likely musculoskeletal. Physical exam is unremarkable and EKG was normal. She did have a pharmacologic nuclear stress test in March of this year which showed no evidence of ischemia with normal ejection fraction. Thus, I do not recommend any further ischemic cardiac evaluation. Continue treatment of risk factors.  2. Exertional dyspnea: Reported history of mitral valve prolapse but no heart murmurs by physical exam. I requested  an echocardiogram.  3. Coronary atherosclerosis noted on CT scan: Recommend healthy lifestyle changes. Treatment with a statin is an option to try to achieve an LDL below 100.     Disposition:   FU with me as needed.  Signed,  Kathlyn Sacramento, MD  01/08/2016 1:43 PM    [place]

## 2016-01-20 ENCOUNTER — Other Ambulatory Visit: Payer: Self-pay | Admitting: Internal Medicine

## 2016-01-25 ENCOUNTER — Other Ambulatory Visit: Payer: Self-pay

## 2016-01-25 ENCOUNTER — Ambulatory Visit (INDEPENDENT_AMBULATORY_CARE_PROVIDER_SITE_OTHER): Payer: Medicare Other

## 2016-01-25 DIAGNOSIS — R0602 Shortness of breath: Secondary | ICD-10-CM | POA: Diagnosis not present

## 2016-02-08 ENCOUNTER — Ambulatory Visit (INDEPENDENT_AMBULATORY_CARE_PROVIDER_SITE_OTHER): Payer: Medicare Other

## 2016-02-08 DIAGNOSIS — Z23 Encounter for immunization: Secondary | ICD-10-CM | POA: Diagnosis not present

## 2016-02-16 ENCOUNTER — Other Ambulatory Visit: Payer: Self-pay | Admitting: *Deleted

## 2016-02-16 ENCOUNTER — Telehealth: Payer: Self-pay | Admitting: Emergency Medicine

## 2016-02-16 ENCOUNTER — Encounter: Payer: Self-pay | Admitting: Nurse Practitioner

## 2016-02-16 ENCOUNTER — Ambulatory Visit (INDEPENDENT_AMBULATORY_CARE_PROVIDER_SITE_OTHER): Payer: Medicare Other | Admitting: Nurse Practitioner

## 2016-02-16 VITALS — BP 124/78 | Temp 97.8°F | Ht 62.0 in | Wt 124.0 lb

## 2016-02-16 DIAGNOSIS — S51852A Open bite of left forearm, initial encounter: Secondary | ICD-10-CM | POA: Diagnosis not present

## 2016-02-16 DIAGNOSIS — L089 Local infection of the skin and subcutaneous tissue, unspecified: Secondary | ICD-10-CM

## 2016-02-16 DIAGNOSIS — W5501XA Bitten by cat, initial encounter: Secondary | ICD-10-CM | POA: Diagnosis not present

## 2016-02-16 MED ORDER — AMOXICILLIN-POT CLAVULANATE 600-42.9 MG/5ML PO SUSR: 10.0000 mL | Freq: Two times a day (BID) | ORAL | 0 refills | Status: DC

## 2016-02-16 NOTE — Telephone Encounter (Signed)
Pharmacy called and needs Clarification on the dosage of amoxicillin-clavulanate (AUGMENTIN) 600-42.9 MG/5ML suspension. Please advise thanks.

## 2016-02-16 NOTE — Progress Notes (Unsigned)
Pre visit review using our clinic review tool, if applicable. No additional management support is needed unless otherwise documented below in the visit note. 

## 2016-02-16 NOTE — Progress Notes (Signed)
Subjective:  Patient ID: Karla Roman, female    DOB: 1940-12-24  Age: 75 y.o. MRN: [id number]  CC: Animal Bite (Pt stated own cat bit her and swollen, warm to touch, sore , redness for 3 days.)   Animal Bite   The incident occurred more than 2 days ago. The incident occurred at home. There is an injury to the left forearm. The pain is mild. It is unlikely that a foreign body is present. Pertinent negatives include no inability to bear weight, no neck pain, no pain when bearing weight, no focal weakness, no tingling and no weakness. There have been no prior injuries to these areas. She is right-handed. Her tetanus status is UTD. She has been behaving normally.  cat bite by domestic animal. Cat belongs to patient and up to date with vaccines.  She states bite was accidental, cat was being playful, first occurrence. States she has difficulty swallowing big pills, hence will like liquid form of abx.  Outpatient Medications Prior to Visit  Medication Sig Dispense Refill  . acetaminophen (TYLENOL) 500 MG tablet Take 500-1,000 mg by mouth every 6 (six) hours as needed for mild pain or headache.    . Biotin 300 MCG TABS Take 1 tablet by mouth daily.     . budesonide-formoterol (SYMBICORT) 80-4.5 MCG/ACT inhaler Inhale 2 puffs into the lungs 2 (two) times daily. 1 Inhaler 11  . Calcium Carbonate (CALTRATE 600) 1500 MG TABS Take 1 tablet by mouth 2 (two) times daily. chewables    . Cholecalciferol (VITAMIN D-3 PO) Take 400 Units by mouth daily.    . cyanocobalamin 500 MCG tablet Take 500 mcg by mouth every morning.    . loperamide (IMODIUM) 2 MG capsule Take 1 capsule (2 mg total) by mouth 2 (two) times daily as needed for diarrhea or loose stools. 60 capsule 11  . omega-3 acid ethyl esters (LOVAZA) 1 G capsule Take 1 g by mouth every morning.    . pantoprazole (PROTONIX) 40 MG tablet Take 1 tablet by mouth  daily before breakfast 90 tablet 3  . saccharomyces boulardii (FLORASTOR) 250 MG capsule  Take 1 capsule (250 mg total) by mouth 2 (two) times daily. 60 capsule 0  . amoxicillin (AMOXIL) 400 MG/5ML suspension      No facility-administered medications prior to visit.     ROS See HPI  Objective:  BP 124/78 (BP Location: Left Arm, Patient Position: Sitting, Cuff Size: Normal)   Temp 97.8 F (36.6 C)   Ht 5\' 2"  (1.575 m)   Wt 124 lb (56.2 kg)   SpO2 98%   BMI 22.68 kg/m   BP Readings from Last 3 Encounters:  02/16/16 124/78  01/08/16 128/68  01/02/16 (!) 120/58    Wt Readings from Last 3 Encounters:  02/16/16 124 lb (56.2 kg)  01/08/16 125 lb 12.8 oz (57.1 kg)  01/02/16 124 lb 8 oz (56.5 kg)    Physical Exam  Constitutional: She is oriented to person, place, and time. No distress.  Cardiovascular: Normal rate.   Pulmonary/Chest: Effort normal.  Musculoskeletal: Normal range of motion.  Neurological: She is alert and oriented to person, place, and time.  Skin: Skin is warm and dry. There is erythema.     Two puncture wounds with surrounding erythema and increased warmth. No drainage.  Psychiatric: She has a normal mood and affect. Her behavior is normal.  Vitals reviewed.   Lab Results  Component Value Date   WBC 6.5 01/02/2016  HGB 13.3 01/02/2016   HCT 39.4 01/02/2016   PLT 216.0 01/02/2016   GLUCOSE 95 01/02/2016   CHOL 241 (H) 01/02/2016   TRIG 97.0 01/02/2016   HDL 100.10 01/02/2016   LDLDIRECT 115.1 12/23/2012   LDLCALC 122 (H) 01/02/2016   ALT 17 01/02/2016   AST 24 01/02/2016   NA 140 01/02/2016   K 4.6 01/02/2016   CL 104 01/02/2016   CREATININE 0.86 01/02/2016   BUN 14 01/02/2016   CO2 29 01/02/2016   TSH 1.16 01/02/2016    Dg Chest 2 View  Result Date: 11/20/2015 CLINICAL DATA:  Right side chest pain radiating into the right shoulder today. EXAM: CHEST  2 VIEW COMPARISON:  PA and lateral chest 09/05/2015 and 07/20/2013. FINDINGS: The lungs are clear. There is some biapical scarring, unchanged. No pneumothorax or pleural  effusion. Heart size is normal. Aortic atherosclerosis is noted. No focal bony abnormality. IMPRESSION: No acute disease. Atherosclerosis. Electronically Signed   By: Inge Rise M.D.   On: 11/20/2015 14:23   Ct Angio Chest Aorta W/cm &/or Wo/cm  Result Date: 11/20/2015 CLINICAL DATA:  75 year old female with a history of right-sided chest pain radiating to the shoulder EXAM: CT CHEST WITHOUT AND WITH CONTRAST TECHNIQUE: Multidetector CT imaging of the chest was performed following the standard protocol before and during bolus administration of intravenous contrast. CONTRAST:  85 cc Isovue 370 COMPARISON:  Chest CT 04/03/2011, abdominal CT 06/03/2013, chest x-ray 11/20/2015 FINDINGS: Chest: Nonvascular Unremarkable appearance of the superficial soft tissues. No axillary or supraclavicular adenopathy. Unremarkable appearance of the thoracic inlet. No mediastinal adenopathy. Unremarkable appearance of the thoracic esophagus. Small hiatal hernia. Linear opacity in the dependent aspects of the bilateral upper lobes with architectural distortion. This is unchanged from the comparison CT of 04/03/2011. Airways are clear. Partially calcified granuloma of the right middle lobe, unchanged from prior. No confluent airspace disease, pneumothorax, or pleural effusion. Vascular: Cardiac: Heart size within normal limits.  No pericardial fluid/thickening. Calcifications of the left main, left anterior descending, circumflex, right coronary arteries. Aorta: Noncontrast exam demonstrates no crescentic hyperdensity to suggest intramural hematoma. Normal course caliber and contour of the thoracic aorta. Diameter of the ascending aorta and descending aorta unremarkable. No dissection flap. No periaortic fluid or inflammatory changes. Scattered calcifications of the aortic arch. Three vessel arch. Patency of the proximal branch vessels maintained, with no occlusion or significant stenosis. No central, lobar, segmental, or  proximal subsegmental filling defects of the pulmonary arterial tree. Upper abdomen: Unremarkable appearance of the nonvascular structures of the upper abdomen. Atherosclerotic changes of the aorta of the upper abdomen, with no acute finding identified. Musculoskeletal: Partially imaged surgical changes of the cervical region. No displaced fracture. Mild degenerative changes of the thoracic spine. No significant bony canal narrowing IMPRESSION: No acute finding of the chest CT to account for the patient's symptoms. No evidence of acute aortic syndrome. Left main and 3 vessel coronary artery disease. Office based assessment may be considered. Small hiatal hernia. Aortic atherosclerosis. Signed, Dulcy Fanny. Earleen Newport, DO [facility] Electronically Signed   By: Corrie Mckusick D.O.   On: 11/20/2015 16:52    Assessment & Plan:   Ariahnna was seen today for animal bite.  Diagnoses and all orders for this visit:  Cat bite of left forearm with infection, initial encounter -     Discontinue: amoxicillin-clavulanate (AUGMENTIN) 600-42.9 MG/5ML suspension; Take 10 mLs by mouth 2 (two) times daily. With food -  amoxicillin-clavulanate (AUGMENTIN) 600-42.9 MG/5ML suspension; Take 10 mLs by mouth 2 (two) times daily. With food   I am having Ms. Vawter maintain her calcium carbonate, acetaminophen, cyanocobalamin, omega-3 acid ethyl esters, saccharomyces boulardii, Biotin, Cholecalciferol (VITAMIN D-3 PO), budesonide-formoterol, loperamide, pantoprazole, amoxicillin, and amoxicillin-clavulanate.  Meds ordered this encounter  Medications  . DISCONTD: amoxicillin-clavulanate (AUGMENTIN) 600-42.9 MG/5ML suspension    Sig: Take 10 mLs by mouth 2 (two) times daily. With food    Dispense:  250 mL    Refill:  0    Order Specific Question:   Supervising Provider    Answer:   Cassandria Anger [[id number]]  . amoxicillin-clavulanate (AUGMENTIN) 600-42.9 MG/5ML  suspension    Sig: Take 10 mLs by mouth 2 (two) times daily. With food    Dispense:  250 mL    Refill:  0    Order Specific Question:   Supervising Provider    Answer:   Cassandria Anger [[id number]]    Follow-up: Return if symptoms worsen or fail to improve.  Wilfred Lacy, NP

## 2016-02-16 NOTE — Telephone Encounter (Signed)
Contact pharmacy and they stated that it has already been picked up and they didn't need any clarification.

## 2016-02-16 NOTE — Progress Notes (Signed)
Pre visit review using our clinic review tool, if applicable. No additional management support is needed unless otherwise documented below in the visit note. 

## 2016-02-16 NOTE — Patient Instructions (Addendum)
Return to office if no improvement in 5days.  Animal Bite Animal bites can range from mild to serious. An animal bite can result in a scratch on the skin, a deep open cut, a puncture of the skin, a crush injury, or tearing away of the skin or a body part. A small bite from a house pet will usually not cause serious problems. However, some animal bites can become infected or injure a bone or other tissue.  Bites from certain animals can be more dangerous because of the risk of spreading rabies, which is a serious viral infection. This risk is higher with bites from stray animals or wild animals, such as raccoons, foxes, skunks, and bats. Dogs are responsible for most animal bites. Children are bitten more often than adults. SYMPTOMS  Common symptoms of an animal bite include:   Pain.   Bleeding.   Swelling.   Bruising.  DIAGNOSIS  This condition may be diagnosed based on a physical exam and medical history. Your health care provider will examine the wound and ask for details about the animal and how the bite happened. You may also have tests, such as:   Blood tests to check for infection or to determine if surgery is needed.  X-rays to check for damage to bones or joints.  Culture test. This uses a sample of fluid from the wound to check for infection. TREATMENT  Treatment varies depending on the location and type of animal bite and your medical history. Treatment may include:   Wound care. This often includes cleaning the wound, flushing the wound with saline solution, and applying a bandage (dressing). Sometimes, the wound is left open to heal because of the high risk of infection. However, in some cases, the wound may be closed with stitches (sutures), staples, skin glue, or adhesive strips.   Antibiotic medicine.   Tetanus shot.   Rabies treatment if the animal could have rabies.  In some cases, bites that have become infected may require IV antibiotics and surgical  treatment in the hospital.  Karla Roman  Follow instructions from your health care provider about how to take care of your wound. Make sure you:  Wash your hands with soap and water before you change your dressing. If soap and water are not available, use hand sanitizer.  Change your dressing as told by your health care provider.  Leave sutures, skin glue, or adhesive strips in place. These skin closures may need to be in place for 2 weeks or longer. If adhesive strip edges start to loosen and curl up, you may trim the loose edges. Do not remove adhesive strips completely unless your health care provider tells you to do that.  Check your wound every day for signs of infection. Watch for:   Increasing redness, swelling, or pain.   Fluid, blood, or pus.  General Instructions  Take or apply over-the-counter and prescription medicines only as told by your health care provider.   If you were prescribed an antibiotic, take or apply it as told by your health care provider. Do not stop using the antibiotic even if your condition improves.   Keep the injured area raised (elevated) above the level of your heart while you are sitting or lying down, if this is possible.   If directed, apply ice to the injured area.   Put ice in a plastic bag.   Place a towel between your skin and the bag.   Leave the ice on  for 20 minutes, 2-3 times per day.   Keep all follow-up visits as told by your health care provider. This is important.  SEEK MEDICAL CARE IF:  You have increasing redness, swelling, or pain at the site of your wound.   You have a general feeling of sickness (malaise).   You feel nauseous or you vomit.   You have pain that does not get better.  SEEK IMMEDIATE MEDICAL CARE IF:  You have a red streak extending away from your wound.   You have fluid, blood, or pus coming from your wound.   You have a fever or chills.   You have  trouble moving your injured area.   You have numbness or tingling extending beyond the wound.   This information is not intended to replace advice given to you by your health care provider. Make sure you discuss any questions you have with your health care provider.   Document Released: 01/15/2011 Document Revised: 01/18/2015 Document Reviewed: 09/14/2014 Elsevier Interactive Patient Education [employer].

## 2016-02-23 ENCOUNTER — Telehealth: Payer: Self-pay | Admitting: Emergency Medicine

## 2016-02-23 DIAGNOSIS — L089 Local infection of the skin and subcutaneous tissue, unspecified: Secondary | ICD-10-CM

## 2016-02-23 DIAGNOSIS — S51852A Open bite of left forearm, initial encounter: Principal | ICD-10-CM

## 2016-02-23 DIAGNOSIS — W5501XA Bitten by cat, initial encounter: Principal | ICD-10-CM

## 2016-02-23 NOTE — Telephone Encounter (Signed)
[facility] called and stated they do not carry amoxicillin-clavulanate (AUGMENTIN) 600-42.9 MG/5ML suspension. They do have tablets. Reference number is [id number]. If you want to keep it the suspension then patient would have to go to their local pharmacy. Please advise thanks.

## 2016-02-26 MED ORDER — AMOXICILLIN-POT CLAVULANATE 600-42.9 MG/5ML PO SUSR: 10.0000 mL | Freq: Two times a day (BID) | ORAL | 0 refills | Status: DC

## 2016-02-26 NOTE — Telephone Encounter (Signed)
erx sent to local pharmacy.

## 2016-02-26 NOTE — Telephone Encounter (Signed)
Please advise on change and if we can send to local instead of mail order

## 2016-02-26 NOTE — Addendum Note (Signed)
Addended by: Aviva Signs M on: 02/26/2016 05:19 PM   Modules accepted: Orders

## 2016-02-26 NOTE — Telephone Encounter (Signed)
Please change to local pharmacy. She has difficulty swallowing tablets. Thank you

## 2016-02-27 NOTE — Telephone Encounter (Signed)
LVM for pt to call back as soon as possible.   

## 2016-03-21 DIAGNOSIS — H5203 Hypermetropia, bilateral: Secondary | ICD-10-CM | POA: Diagnosis not present

## 2016-03-21 DIAGNOSIS — H2513 Age-related nuclear cataract, bilateral: Secondary | ICD-10-CM | POA: Diagnosis not present

## 2016-03-21 DIAGNOSIS — H524 Presbyopia: Secondary | ICD-10-CM | POA: Diagnosis not present

## 2016-05-07 ENCOUNTER — Ambulatory Visit: Payer: Medicare Other | Admitting: Internal Medicine

## 2016-07-06 ENCOUNTER — Encounter: Payer: Self-pay | Admitting: Internal Medicine

## 2016-07-06 ENCOUNTER — Ambulatory Visit (INDEPENDENT_AMBULATORY_CARE_PROVIDER_SITE_OTHER): Payer: Medicare Other | Admitting: Internal Medicine

## 2016-07-06 VITALS — BP 126/74 | HR 86 | Temp 98.9°F | Wt 122.0 lb

## 2016-07-06 DIAGNOSIS — J40 Bronchitis, not specified as acute or chronic: Secondary | ICD-10-CM

## 2016-07-06 LAB — POCT INFLUENZA A/B
Influenza A, POC: NEGATIVE
Influenza B, POC: NEGATIVE

## 2016-07-06 MED ORDER — METHYLPREDNISOLONE ACETATE 40 MG/ML IJ SUSP
40.0000 mg | Freq: Once | INTRAMUSCULAR | Status: AC
  Administered 2016-07-06: 40 mg via INTRAMUSCULAR

## 2016-07-06 MED ORDER — PREDNISONE 10 MG PO TABS: ORAL_TABLET | ORAL | 0 refills | Status: DC

## 2016-07-06 MED ORDER — AZITHROMYCIN 250 MG PO TABS: ORAL_TABLET | ORAL | 0 refills | Status: DC

## 2016-07-06 MED ORDER — BENZONATATE 200 MG PO CAPS: 200.0000 mg | ORAL_CAPSULE | Freq: Three times a day (TID) | ORAL | 1 refills | Status: DC | PRN

## 2016-07-06 NOTE — Progress Notes (Signed)
Subjective:  Patient ID: Karla Roman, female    DOB: [birth year]  Age: 76 y.o. MRN: [id number]  CC: The encounter diagnosis was Bronchitis.  HPI ZANOBIA SMYSER presents for treatment of respiratory infection with cough  .  Her symptoms started wednesday after  Contact with grandhildren who were diagnosed with flu on Sunday .  Fever of 101 on Wednesday. Had bad headache and achiness on f Wednesday as well.  Has prior 'BAD REACTION " TO TAMIFLU SO WON'T TAKE IT.  Temp was 100 today at home     SHE HAS A HISTORY OF PNEUMONIA SEVERAL YEARS AGO.   Bilateral scarring noted on July chest film during er eval for chest pain.  Was   Treated for "asthma" with MDI due to wheezing episode during a prior URI,  But no prior PFTS.  Did not feel the MDI helped so she stopped taking it.    Outpatient Medications Prior to Visit  Medication Sig Dispense Refill  . acetaminophen (TYLENOL) 500 MG tablet Take 500-1,000 mg by mouth every 6 (six) hours as needed for mild pain or headache.    . Biotin 300 MCG TABS Take 1 tablet by mouth daily.     . Calcium Carbonate (CALTRATE 600) 1500 MG TABS Take 1 tablet by mouth 2 (two) times daily. chewables    . Cholecalciferol (VITAMIN D-3 PO) Take 400 Units by mouth daily.    . cyanocobalamin 500 MCG tablet Take 500 mcg by mouth every morning.    . loperamide (IMODIUM) 2 MG capsule Take 1 capsule (2 mg total) by mouth 2 (two) times daily as needed for diarrhea or loose stools. 60 capsule 11  . omega-3 acid ethyl esters (LOVAZA) 1 G capsule Take 1 g by mouth every morning.    . pantoprazole (PROTONIX) 40 MG tablet Take 1 tablet by mouth  daily before breakfast 90 tablet 3  . saccharomyces boulardii (FLORASTOR) 250 MG capsule Take 1 capsule (250 mg total) by mouth 2 (two) times daily. 60 capsule 0  . amoxicillin (AMOXIL) 400 MG/5ML suspension     . amoxicillin-clavulanate (AUGMENTIN) 600-42.9 MG/5ML suspension Take 10 mLs by mouth 2 (two) times daily. With food 250 mL  0  . budesonide-formoterol (SYMBICORT) 80-4.5 MCG/ACT inhaler Inhale 2 puffs into the lungs 2 (two) times daily. (Patient not taking: Reported on 07/06/2016) 1 Inhaler 11   No facility-administered medications prior to visit.     Review of Systems;  Patient denies headache, fevers, malais, unintentional weight loss, skin rash, eye pain, sinus congestion and sinus pain, sore throat, dysphagia,  hemoptysis  wheezing, chest pain, palpitations, orthopnea, edema, abdominal pain, nausea, melena, diarrhea, constipation, flank pain, dysuria, hematuria, urinary  Frequency, nocturia, numbness, tingling, seizures,  Focal weakness, Loss of consciousness,  Tremor, insomnia, depression, anxiety, and suicidal ideation.      Objective:  BP 126/74   Pulse 86   Temp 98.9 F (37.2 C) (Oral)   Wt 122 lb (55.3 kg)   SpO2 98%   BMI 22.31 kg/m   BP Readings from Last 3 Encounters:  07/06/16 126/74  02/16/16 124/78  01/08/16 128/68    Wt Readings from Last 3 Encounters:  07/06/16 122 lb (55.3 kg)  02/16/16 124 lb (56.2 kg)  01/08/16 125 lb 12.8 oz (57.1 kg)    General appearance: alert, cooperative and appears stated age Ears: normal TM's and external ear canals both ears Throat: lips, mucosa, and tongue normal; teeth and gums normal Neck: no adenopathy,  no carotid bruit, supple, symmetrical, trachea midline and thyroid not enlarged, symmetric, no tenderness/mass/nodules Back: symmetric, no curvature. ROM normal. No CVA tenderness. Lungs: coarse breath sounds bilaterally,  No ronchi or egophony. Heart: regular rate and rhythm, S1, S2 normal, no murmur, click, rub or gallop Abdomen: soft, non-tender; bowel sounds normal; no masses,  no organomegaly Pulses: 2+ and symmetric Skin: Skin color, texture, turgor normal. No rashes or lesions Lymph nodes: Cervical, supraclavicular, and axillary nodes normal.  No results found for: HGBA1C  Lab Results  Component Value Date   CREATININE 0.86 01/02/2016    CREATININE 0.78 11/20/2015   CREATININE 0.77 08/03/2015    Lab Results  Component Value Date   WBC 6.5 01/02/2016   HGB 13.3 01/02/2016   HCT 39.4 01/02/2016   PLT 216.0 01/02/2016   GLUCOSE 95 01/02/2016   CHOL 241 (H) 01/02/2016   TRIG 97.0 01/02/2016   HDL 100.10 01/02/2016   LDLDIRECT 115.1 12/23/2012   LDLCALC 122 (H) 01/02/2016   ALT 17 01/02/2016   AST 24 01/02/2016   NA 140 01/02/2016   K 4.6 01/02/2016   CL 104 01/02/2016   CREATININE 0.86 01/02/2016   BUN 14 01/02/2016   CO2 29 01/02/2016   TSH 1.16 01/02/2016    Dg Chest 2 View  Result Date: 11/20/2015 CLINICAL DATA:  Right side chest pain radiating into the right shoulder today. EXAM: CHEST  2 VIEW COMPARISON:  PA and lateral chest 09/05/2015 and 07/20/2013. FINDINGS: The lungs are clear. There is some biapical scarring, unchanged. No pneumothorax or pleural effusion. Heart size is normal. Aortic atherosclerosis is noted. No focal bony abnormality. IMPRESSION: No acute disease. Atherosclerosis. Electronically Signed   By: Inge Rise M.D.   On: 11/20/2015 14:23   Ct Angio Chest Aorta W/cm &/or Wo/cm  Result Date: 11/20/2015 CLINICAL DATA:  76 year old female with a history of right-sided chest pain radiating to the shoulder EXAM: CT CHEST WITHOUT AND WITH CONTRAST TECHNIQUE: Multidetector CT imaging of the chest was performed following the standard protocol before and during bolus administration of intravenous contrast. CONTRAST:  85 cc Isovue 370 COMPARISON:  Chest CT 04/03/2011, abdominal CT 06/03/2013, chest x-ray 11/20/2015 FINDINGS: Chest: Nonvascular Unremarkable appearance of the superficial soft tissues. No axillary or supraclavicular adenopathy. Unremarkable appearance of the thoracic inlet. No mediastinal adenopathy. Unremarkable appearance of the thoracic esophagus. Small hiatal hernia. Linear opacity in the dependent aspects of the bilateral upper lobes with architectural distortion. This is unchanged  from the comparison CT of 04/03/2011. Airways are clear. Partially calcified granuloma of the right middle lobe, unchanged from prior. No confluent airspace disease, pneumothorax, or pleural effusion. Vascular: Cardiac: Heart size within normal limits.  No pericardial fluid/thickening. Calcifications of the left main, left anterior descending, circumflex, right coronary arteries. Aorta: Noncontrast exam demonstrates no crescentic hyperdensity to suggest intramural hematoma. Normal course caliber and contour of the thoracic aorta. Diameter of the ascending aorta and descending aorta unremarkable. No dissection flap. No periaortic fluid or inflammatory changes. Scattered calcifications of the aortic arch. Three vessel arch. Patency of the proximal branch vessels maintained, with no occlusion or significant stenosis. No central, lobar, segmental, or proximal subsegmental filling defects of the pulmonary arterial tree. Upper abdomen: Unremarkable appearance of the nonvascular structures of the upper abdomen. Atherosclerotic changes of the aorta of the upper abdomen, with no acute finding identified. Musculoskeletal: Partially imaged surgical changes of the cervical region. No displaced fracture. Mild degenerative changes of the thoracic spine. No significant bony canal narrowing  IMPRESSION: No acute finding of the chest CT to account for the patient's symptoms. No evidence of acute aortic syndrome. Left main and 3 vessel coronary artery disease. Office based assessment may be considered. Small hiatal hernia. Aortic atherosclerosis. Signed, Dulcy Fanny. Earleen Newport, DO [facility] Electronically Signed   By: Corrie Mckusick D.O.   On: 11/20/2015 16:52    Assessment & Plan:   Problem List Items Addressed This Visit    Bronchitis - Primary    Secondary to viral URI by history.  POCT flu test negative. Will treat with prednisone taper,  Tessalon perles,  Add  azithromycin in 48 hours if no improvement      Relevant Medications   methylPREDNISolone acetate (DEPO-MEDROL) injection 40 mg (Completed)   Other Relevant Orders   POCT Influenza A/B (Completed)      I have discontinued Ms. Yerger's amoxicillin and amoxicillin-clavulanate. I am also having her start on predniSONE, benzonatate, and azithromycin. Additionally, I am having her maintain her calcium carbonate, acetaminophen, cyanocobalamin, omega-3 acid ethyl esters, saccharomyces boulardii, Biotin, Cholecalciferol (VITAMIN D-3 PO), budesonide-formoterol, loperamide, and pantoprazole. We administered methylPREDNISolone acetate.  Meds ordered this encounter  Medications  . predniSONE (DELTASONE) 10 MG tablet    Sig: 6 tablets on Day 1 , then reduce by 1 tablet daily until gone    Dispense:  21 tablet    Refill:  0  . benzonatate (TESSALON) 200 MG capsule    Sig: Take 1 capsule (200 mg total) by mouth 3 (three) times daily as needed for cough.    Dispense:  60 capsule    Refill:  1  . azithromycin (ZITHROMAX) 250 MG tablet    Sig: 2 tablets one day 1,  One tablet daily until gone    Dispense:  6 tablet    Refill:  0  . methylPREDNISolone acetate (DEPO-MEDROL) injection 40 mg    Medications Discontinued During This Encounter  Medication Reason  . amoxicillin-clavulanate (AUGMENTIN) 600-42.9 MG/5ML suspension Completed Course  . amoxicillin (AMOXIL) 400 MG/5ML suspension Completed Course    Follow-up: No Follow-up on file.   Crecencio Mc, MD

## 2016-07-06 NOTE — Patient Instructions (Addendum)
You may have had the flu, but you are out of the "treatment window"  I am prescribing you a prednisone taper to start tomorrow, and tessalon perles (cough capsules) to treat the resulting bronchitis      If you don't  feel better in 48 hours  You can start the5 day course of azithromycin,    You are contagious if you are having fevers, so avoid contact with others unmtil you have had no fever in 24 hours

## 2016-07-06 NOTE — Progress Notes (Signed)
Pre visit review using our clinic review tool, if applicable. No additional management support is needed unless otherwise documented below in the visit note. 

## 2016-07-06 NOTE — Assessment & Plan Note (Signed)
Secondary to viral URI by history.  POCT flu test negative. Will treat with prednisone taper,  Tessalon perles,  Add azithromycin in 48 hours if no improvement

## 2016-07-12 ENCOUNTER — Telehealth: Payer: Self-pay | Admitting: Internal Medicine

## 2016-07-12 DIAGNOSIS — M8580 Other specified disorders of bone density and structure, unspecified site: Secondary | ICD-10-CM

## 2016-07-12 NOTE — Telephone Encounter (Signed)
Do they have space to put pt in?

## 2016-07-12 NOTE — Telephone Encounter (Signed)
Pt is going to have a mammogram done 3/28 at [facility] and would like for dr Ronnald Ramp to put in a order to get a dexa scan there the same day

## 2016-07-15 NOTE — Telephone Encounter (Signed)
I would just send the order to them and let them do the scheduling. If they didn't have the space that day I'm sure they would reschedule so she can do it all the same time

## 2016-07-16 NOTE — Telephone Encounter (Signed)
Bone density order placed and comment added to do at the same time as mammogram.

## 2016-07-23 ENCOUNTER — Other Ambulatory Visit: Payer: Self-pay | Admitting: Internal Medicine

## 2016-07-23 DIAGNOSIS — M8580 Other specified disorders of bone density and structure, unspecified site: Secondary | ICD-10-CM

## 2016-07-23 DIAGNOSIS — T380X5A Adverse effect of glucocorticoids and synthetic analogues, initial encounter: Principal | ICD-10-CM

## 2016-08-07 DIAGNOSIS — Z1231 Encounter for screening mammogram for malignant neoplasm of breast: Secondary | ICD-10-CM | POA: Diagnosis not present

## 2016-08-07 DIAGNOSIS — Z803 Family history of malignant neoplasm of breast: Secondary | ICD-10-CM | POA: Diagnosis not present

## 2016-08-07 DIAGNOSIS — M8589 Other specified disorders of bone density and structure, multiple sites: Secondary | ICD-10-CM | POA: Diagnosis not present

## 2016-08-07 LAB — HM MAMMOGRAPHY

## 2016-08-07 LAB — HM DEXA SCAN: HM Dexa Scan: -1.8

## 2016-08-08 ENCOUNTER — Encounter: Payer: Self-pay | Admitting: Internal Medicine

## 2016-08-08 NOTE — Progress Notes (Signed)
Result Abstracted 

## 2016-08-13 ENCOUNTER — Encounter: Payer: Self-pay | Admitting: Internal Medicine

## 2016-08-13 DIAGNOSIS — N6489 Other specified disorders of breast: Secondary | ICD-10-CM | POA: Diagnosis not present

## 2016-08-16 ENCOUNTER — Encounter: Payer: Self-pay | Admitting: Internal Medicine

## 2016-08-19 LAB — HM MAMMOGRAPHY

## 2016-08-26 ENCOUNTER — Ambulatory Visit
Admission: RE | Admit: 2016-08-26 | Discharge: 2016-08-26 | Disposition: A | Discharge status: Home / Self Care | Payer: Medicare Other | Source: Ambulatory Visit | Attending: Nurse Practitioner | Admitting: Nurse Practitioner

## 2016-08-26 ENCOUNTER — Ambulatory Visit (INDEPENDENT_AMBULATORY_CARE_PROVIDER_SITE_OTHER): Payer: Medicare Other | Admitting: Nurse Practitioner

## 2016-08-26 ENCOUNTER — Telehealth: Payer: Self-pay | Admitting: Internal Medicine

## 2016-08-26 ENCOUNTER — Other Ambulatory Visit (INDEPENDENT_AMBULATORY_CARE_PROVIDER_SITE_OTHER): Payer: Medicare Other

## 2016-08-26 ENCOUNTER — Ambulatory Visit (INDEPENDENT_AMBULATORY_CARE_PROVIDER_SITE_OTHER)
Admission: RE | Admit: 2016-08-26 | Discharge: 2016-08-26 | Disposition: A | Discharge status: Home / Self Care | Payer: Medicare Other | Source: Ambulatory Visit | Attending: Nurse Practitioner | Admitting: Nurse Practitioner

## 2016-08-26 ENCOUNTER — Encounter: Payer: Self-pay | Admitting: Nurse Practitioner

## 2016-08-26 VITALS — BP 138/70 | HR 71 | Temp 98.0°F | Ht 62.0 in | Wt 124.0 lb

## 2016-08-26 DIAGNOSIS — R1013 Epigastric pain: Secondary | ICD-10-CM

## 2016-08-26 DIAGNOSIS — R071 Chest pain on breathing: Secondary | ICD-10-CM

## 2016-08-26 DIAGNOSIS — R1012 Left upper quadrant pain: Secondary | ICD-10-CM | POA: Diagnosis not present

## 2016-08-26 LAB — CBC WITH DIFFERENTIAL/PLATELET
Basophils Absolute: 0 10*3/uL (ref 0.0–0.1)
Basophils Relative: 0.4 % (ref 0.0–3.0)
Eosinophils Absolute: 0.1 10*3/uL (ref 0.0–0.7)
Eosinophils Relative: 1.4 % (ref 0.0–5.0)
HCT: 38.8 % (ref 36.0–46.0)
Hemoglobin: 12.9 g/dL (ref 12.0–15.0)
Lymphocytes Relative: 17.3 % (ref 12.0–46.0)
Lymphs Abs: 1.3 10*3/uL (ref 0.7–4.0)
MCHC: 33.2 g/dL (ref 30.0–36.0)
MCV: 93.1 fl (ref 78.0–100.0)
Monocytes Absolute: 0.7 10*3/uL (ref 0.1–1.0)
Monocytes Relative: 9.4 % (ref 3.0–12.0)
Neutro Abs: 5.2 10*3/uL (ref 1.4–7.7)
Neutrophils Relative %: 71.5 % (ref 43.0–77.0)
Platelets: 205 10*3/uL (ref 150.0–400.0)
RBC: 4.17 Mil/uL (ref 3.87–5.11)
RDW: 13.9 % (ref 11.5–15.5)
WBC: 7.3 10*3/uL (ref 4.0–10.5)

## 2016-08-26 LAB — HEPATIC FUNCTION PANEL
ALT: 15 U/L (ref 0–35)
AST: 24 U/L (ref 0–37)
Albumin: 4.4 g/dL (ref 3.5–5.2)
Alkaline Phosphatase: 80 U/L (ref 39–117)
Bilirubin, Direct: 0.2 mg/dL (ref 0.0–0.3)
Total Bilirubin: 0.8 mg/dL (ref 0.2–1.2)
Total Protein: 7.2 g/dL (ref 6.0–8.3)

## 2016-08-26 LAB — AMYLASE: Amylase: 39 U/L (ref 27–131)

## 2016-08-26 LAB — LIPASE: Lipase: 14 U/L (ref 11.0–59.0)

## 2016-08-26 MED ORDER — MELOXICAM 7.5 MG PO TABS: 7.5000 mg | ORAL_TABLET | Freq: Every day | ORAL | 0 refills | Status: DC

## 2016-08-26 NOTE — Patient Instructions (Addendum)
Normal CXR and lab results.  Normal ABD [place].  CT ABD ordered to further evaluate pancreas

## 2016-08-26 NOTE — Telephone Encounter (Signed)
Sent meloxicam for pain.

## 2016-08-26 NOTE — Telephone Encounter (Signed)
Call report for ultrasound exam   Right upper quad negetive  Is still in a lot of pain  Pt wants call back. Please call cell phone.

## 2016-08-26 NOTE — Progress Notes (Signed)
Pre visit review using our clinic review tool, if applicable. No additional management support is needed unless otherwise documented below in the visit note. 

## 2016-08-26 NOTE — Telephone Encounter (Signed)
Spoke with pt, inform her we sent in Meloxicam to help with the pain but to stated she doesn't want to take medication. She really wants to find out what could be the cause of the pain she is having.   appt is set with Dr. Ronnald Ramp 08/27/16 for this problem.   [place] please advise, not sure what else can we do to find our about the pain.

## 2016-08-26 NOTE — Progress Notes (Signed)
Subjective:  Patient ID: Karla Roman, female    DOB: 06-22-1940  Age: 76 y.o. MRN: [id number]  CC: Pain (pain LUQ abdominal,hurt when breath,move going for 2 days. )   Abdominal Pain  This is a new problem. The current episode started in the past 7 days. The onset quality is sudden. The problem occurs constantly. The problem has been unchanged. The pain is located in the epigastric region (radiates to LUQ). The pain is severe. The quality of the pain is a sensation of fullness and dull. The abdominal pain radiates to the LUQ. Associated symptoms include anorexia. Pertinent negatives include no belching, constipation, diarrhea, fever, flatus, frequency, hematochezia, hematuria, melena, myalgias or nausea. The pain is aggravated by being still. The pain is relieved by nothing. She has tried nothing for the symptoms. Her past medical history is significant for GERD and irritable bowel syndrome.    Outpatient Medications Prior to Visit  Medication Sig Dispense Refill  . acetaminophen (TYLENOL) 500 MG tablet Take 500-1,000 mg by mouth every 6 (six) hours as needed for mild pain or headache.    . benzonatate (TESSALON) 200 MG capsule Take 1 capsule (200 mg total) by mouth 3 (three) times daily as needed for cough. 60 capsule 1  . Biotin 300 MCG TABS Take 1 tablet by mouth daily.     . budesonide-formoterol (SYMBICORT) 80-4.5 MCG/ACT inhaler Inhale 2 puffs into the lungs 2 (two) times daily. 1 Inhaler 11  . Calcium Carbonate (CALTRATE 600) 1500 MG TABS Take 1 tablet by mouth 2 (two) times daily. chewables    . Cholecalciferol (VITAMIN D-3 PO) Take 400 Units by mouth daily.    . cyanocobalamin 500 MCG tablet Take 500 mcg by mouth every morning.    . loperamide (IMODIUM) 2 MG capsule Take 1 capsule (2 mg total) by mouth 2 (two) times daily as needed for diarrhea or loose stools. 60 capsule 11  . omega-3 acid ethyl esters (LOVAZA) 1 G capsule Take 1 g by mouth every morning.    . pantoprazole  (PROTONIX) 40 MG tablet Take 1 tablet by mouth  daily before breakfast 90 tablet 3  . saccharomyces boulardii (FLORASTOR) 250 MG capsule Take 1 capsule (250 mg total) by mouth 2 (two) times daily. 60 capsule 0  . predniSONE (DELTASONE) 10 MG tablet 6 tablets on Day 1 , then reduce by 1 tablet daily until gone 21 tablet 0  . azithromycin (ZITHROMAX) 250 MG tablet 2 tablets one day 1,  One tablet daily until gone (Patient not taking: Reported on 08/26/2016) 6 tablet 0   No facility-administered medications prior to visit.     ROS See HPI  Objective:  BP 138/70   Pulse 71   Temp 98 F (36.7 C)   Ht 5\' 2"  (1.575 m)   Wt 124 lb (56.2 kg)   SpO2 98%   BMI 22.68 kg/m   BP Readings from Last 3 Encounters:  08/26/16 138/70  07/06/16 126/74  02/16/16 124/78    Wt Readings from Last 3 Encounters:  08/26/16 124 lb (56.2 kg)  07/06/16 122 lb (55.3 kg)  02/16/16 124 lb (56.2 kg)    Physical Exam  Constitutional: She is oriented to person, place, and time. No distress.  Cardiovascular: Normal rate, regular rhythm and normal heart sounds.   Pulmonary/Chest: Effort normal and breath sounds normal.  Abdominal: Soft. She exhibits no distension. There is tenderness. There is guarding. There is no rebound.  Musculoskeletal: She exhibits no edema.  Lymphadenopathy:    She has no cervical adenopathy.  Neurological: She is alert and oriented to person, place, and time.  Skin: Skin is warm and dry.  Vitals reviewed.   Lab Results  Component Value Date   WBC 7.3 08/26/2016   HGB 12.9 08/26/2016   HCT 38.8 08/26/2016   PLT 205.0 08/26/2016   GLUCOSE 95 01/02/2016   CHOL 241 (H) 01/02/2016   TRIG 97.0 01/02/2016   HDL 100.10 01/02/2016   LDLDIRECT 115.1 12/23/2012   LDLCALC 122 (H) 01/02/2016   ALT 15 08/26/2016   AST 24 08/26/2016   NA 140 01/02/2016   K 4.6 01/02/2016   CL 104 01/02/2016   CREATININE 0.86 01/02/2016   BUN 14 01/02/2016   CO2 29 01/02/2016   TSH 1.16 01/02/2016     Dg Chest 2 View  Result Date: 11/20/2015 CLINICAL DATA:  Right side chest pain radiating into the right shoulder today. EXAM: CHEST  2 VIEW COMPARISON:  PA and lateral chest 09/05/2015 and 07/20/2013. FINDINGS: The lungs are clear. There is some biapical scarring, unchanged. No pneumothorax or pleural effusion. Heart size is normal. Aortic atherosclerosis is noted. No focal bony abnormality. IMPRESSION: No acute disease. Atherosclerosis. Electronically Signed   By: Inge Rise M.D.   On: 11/20/2015 14:23   Ct Angio Chest Aorta W/cm &/or Wo/cm  Result Date: 11/20/2015 CLINICAL DATA:  76 year old female with a history of right-sided chest pain radiating to the shoulder EXAM: CT CHEST WITHOUT AND WITH CONTRAST TECHNIQUE: Multidetector CT imaging of the chest was performed following the standard protocol before and during bolus administration of intravenous contrast. CONTRAST:  85 cc Isovue 370 COMPARISON:  Chest CT 04/03/2011, abdominal CT 06/03/2013, chest x-ray 11/20/2015 FINDINGS: Chest: Nonvascular Unremarkable appearance of the superficial soft tissues. No axillary or supraclavicular adenopathy. Unremarkable appearance of the thoracic inlet. No mediastinal adenopathy. Unremarkable appearance of the thoracic esophagus. Small hiatal hernia. Linear opacity in the dependent aspects of the bilateral upper lobes with architectural distortion. This is unchanged from the comparison CT of 04/03/2011. Airways are clear. Partially calcified granuloma of the right middle lobe, unchanged from prior. No confluent airspace disease, pneumothorax, or pleural effusion. Vascular: Cardiac: Heart size within normal limits.  No pericardial fluid/thickening. Calcifications of the left main, left anterior descending, circumflex, right coronary arteries. Aorta: Noncontrast exam demonstrates no crescentic hyperdensity to suggest intramural hematoma. Normal course caliber and contour of the thoracic aorta. Diameter of the  ascending aorta and descending aorta unremarkable. No dissection flap. No periaortic fluid or inflammatory changes. Scattered calcifications of the aortic arch. Three vessel arch. Patency of the proximal branch vessels maintained, with no occlusion or significant stenosis. No central, lobar, segmental, or proximal subsegmental filling defects of the pulmonary arterial tree. Upper abdomen: Unremarkable appearance of the nonvascular structures of the upper abdomen. Atherosclerotic changes of the aorta of the upper abdomen, with no acute finding identified. Musculoskeletal: Partially imaged surgical changes of the cervical region. No displaced fracture. Mild degenerative changes of the thoracic spine. No significant bony canal narrowing IMPRESSION: No acute finding of the chest CT to account for the patient's symptoms. No evidence of acute aortic syndrome. Left main and 3 vessel coronary artery disease. Office based assessment may be considered. Small hiatal hernia. Aortic atherosclerosis. Signed, Dulcy Fanny. Earleen Newport, DO [facility] Electronically Signed   By: Corrie Mckusick D.O.   On: 11/20/2015 16:52    Assessment & Plan:   Sigourney was seen today for  pain.  Diagnoses and all orders for this visit:  Epigastric pain -     CBC w/Diff; Future -     Lipase; Future -     Amylase; Future -     US Abdomen Limited; Future -     DG Chest 2 View; Future -     Hepatic function panel; Future -     meloxicam (MOBIC) 7.5 MG tablet; Take 1 tablet (7.5 mg total) by mouth daily.  Pain of anterior chest wall with respiration -     CBC w/Diff; Future -     Lipase; Future -     Amylase; Future -     US Abdomen Limited; Future -     DG Chest 2 View; Future -     Hepatic function panel; Future -     meloxicam (MOBIC) 7.5 MG tablet; Take 1 tablet (7.5 mg total) by mouth daily.   I have discontinued Ms. Mane's predniSONE and azithromycin. I am also having  her start on meloxicam. Additionally, I am having her maintain her calcium carbonate, acetaminophen, cyanocobalamin, omega-3 acid ethyl esters, saccharomyces boulardii, Biotin, Cholecalciferol (VITAMIN D-3 PO), budesonide-formoterol, loperamide, pantoprazole, and benzonatate.  Meds ordered this encounter  Medications  . meloxicam (MOBIC) 7.5 MG tablet    Sig: Take 1 tablet (7.5 mg total) by mouth daily.    Dispense:  30 tablet    Refill:  0    Order Specific Question:   Supervising Provider    Answer:   Cassandria Anger [[id number]]    Follow-up: Return if symptoms worsen or fail to improve.  Wilfred Lacy, NP

## 2016-08-27 ENCOUNTER — Encounter: Payer: Self-pay | Admitting: Internal Medicine

## 2016-08-27 ENCOUNTER — Other Ambulatory Visit (INDEPENDENT_AMBULATORY_CARE_PROVIDER_SITE_OTHER): Payer: Medicare Other

## 2016-08-27 ENCOUNTER — Ambulatory Visit (INDEPENDENT_AMBULATORY_CARE_PROVIDER_SITE_OTHER): Payer: Medicare Other | Admitting: Internal Medicine

## 2016-08-27 VITALS — BP 120/68 | HR 81 | Temp 98.1°F | Resp 16 | Ht 62.0 in | Wt 123.5 lb

## 2016-08-27 DIAGNOSIS — R0789 Other chest pain: Secondary | ICD-10-CM

## 2016-08-27 DIAGNOSIS — R10812 Left upper quadrant abdominal tenderness: Secondary | ICD-10-CM

## 2016-08-27 DIAGNOSIS — R091 Pleurisy: Secondary | ICD-10-CM | POA: Diagnosis not present

## 2016-08-27 LAB — CBC WITH DIFFERENTIAL/PLATELET
Basophils Absolute: 0.1 10*3/uL (ref 0.0–0.1)
Basophils Relative: 0.7 % (ref 0.0–3.0)
Eosinophils Absolute: 0.1 10*3/uL (ref 0.0–0.7)
Eosinophils Relative: 1.5 % (ref 0.0–5.0)
HCT: 38.9 % (ref 36.0–46.0)
Hemoglobin: 13 g/dL (ref 12.0–15.0)
Lymphocytes Relative: 18.7 % (ref 12.0–46.0)
Lymphs Abs: 1.6 10*3/uL (ref 0.7–4.0)
MCHC: 33.4 g/dL (ref 30.0–36.0)
MCV: 92.8 fl (ref 78.0–100.0)
Monocytes Absolute: 0.7 10*3/uL (ref 0.1–1.0)
Monocytes Relative: 8.3 % (ref 3.0–12.0)
Neutro Abs: 5.9 10*3/uL (ref 1.4–7.7)
Neutrophils Relative %: 70.8 % (ref 43.0–77.0)
Platelets: 213 10*3/uL (ref 150.0–400.0)
RBC: 4.18 Mil/uL (ref 3.87–5.11)
RDW: 14 % (ref 11.5–15.5)
WBC: 8.3 10*3/uL (ref 4.0–10.5)

## 2016-08-27 LAB — COMPREHENSIVE METABOLIC PANEL
ALT: 15 U/L (ref 0–35)
AST: 20 U/L (ref 0–37)
Albumin: 4.5 g/dL (ref 3.5–5.2)
Alkaline Phosphatase: 93 U/L (ref 39–117)
BUN: 19 mg/dL (ref 6–23)
CO2: 28 mEq/L (ref 19–32)
Calcium: 9.7 mg/dL (ref 8.4–10.5)
Chloride: 105 mEq/L (ref 96–112)
Creatinine, Ser: 0.81 mg/dL (ref 0.40–1.20)
GFR: 72.82 mL/min (ref >60.00)
Glucose, Bld: 109 mg/dL — ABNORMAL HIGH (ref 70–99)
Potassium: 4.1 mEq/L (ref 3.5–5.1)
Sodium: 141 mEq/L (ref 135–145)
Total Bilirubin: 0.7 mg/dL (ref 0.2–1.2)
Total Protein: 7.4 g/dL (ref 6.0–8.3)

## 2016-08-27 LAB — AMYLASE: Amylase: 39 U/L (ref 27–131)

## 2016-08-27 LAB — LIPASE: Lipase: 16 U/L (ref 11.0–59.0)

## 2016-08-27 NOTE — Patient Instructions (Signed)

## 2016-08-27 NOTE — Progress Notes (Signed)
Pre visit review using our clinic review tool, if applicable. No additional management support is needed unless otherwise documented below in the visit note. 

## 2016-08-27 NOTE — Progress Notes (Signed)
Subjective:  Patient ID: Karla Roman, female    DOB: 1940/09/12  Age: 76 y.o. MRN: [id number]  CC: Pleurisy   HPI LEVADA BOWERSOX presents for a 3 day hx of LUQ pain and left lower chest wall pain, She was seen by someone else one day prior to this and had a normal chest x-ray/normal abdominal ultrasound/and normal lab work. She describes the pain as a grabbing sensation with deep breaths and when she presses on it. She denies nausea, vomiting, cough, dysuria, hematuria, rash, diarrhea, or constipation. She is getting symptom relief with Tylenol.  Outpatient Medications Prior to Visit  Medication Sig Dispense Refill  . acetaminophen (TYLENOL) 500 MG tablet Take 500-1,000 mg by mouth every 6 (six) hours as needed for mild pain or headache.    . Biotin 300 MCG TABS Take 1 tablet by mouth daily.     . budesonide-formoterol (SYMBICORT) 80-4.5 MCG/ACT inhaler Inhale 2 puffs into the lungs 2 (two) times daily. 1 Inhaler 11  . Calcium Carbonate (CALTRATE 600) 1500 MG TABS Take 1 tablet by mouth 2 (two) times daily. chewables    . Cholecalciferol (VITAMIN D-3 PO) Take 400 Units by mouth daily.    . cyanocobalamin 500 MCG tablet Take 500 mcg by mouth every morning.    . loperamide (IMODIUM) 2 MG capsule Take 1 capsule (2 mg total) by mouth 2 (two) times daily as needed for diarrhea or loose stools. 60 capsule 11  . meloxicam (MOBIC) 7.5 MG tablet Take 1 tablet (7.5 mg total) by mouth daily. 30 tablet 0  . omega-3 acid ethyl esters (LOVAZA) 1 G capsule Take 1 g by mouth every morning.    . pantoprazole (PROTONIX) 40 MG tablet Take 1 tablet by mouth  daily before breakfast 90 tablet 3  . saccharomyces boulardii (FLORASTOR) 250 MG capsule Take 1 capsule (250 mg total) by mouth 2 (two) times daily. 60 capsule 0  . benzonatate (TESSALON) 200 MG capsule Take 1 capsule (200 mg total) by mouth 3 (three) times daily as needed for cough. 60 capsule 1   No facility-administered medications prior to  visit.     ROS Review of Systems  Constitutional: Negative for appetite change, diaphoresis, fatigue and unexpected weight change.  HENT: Negative.  Negative for sinus pressure, sore throat and trouble swallowing.   Eyes: Negative.  Negative for visual disturbance.  Respiratory: Negative.  Negative for cough, chest tightness, shortness of breath and wheezing.   Cardiovascular: Positive for chest pain. Negative for palpitations and leg swelling.  Gastrointestinal: Positive for abdominal pain. Negative for constipation, diarrhea, nausea and vomiting.  Endocrine: Negative.   Genitourinary: Negative.  Negative for difficulty urinating, dysuria, flank pain, frequency, hematuria and urgency.  Musculoskeletal: Negative.  Negative for back pain, myalgias and neck pain.  Skin: Negative.  Negative for color change and rash.  Allergic/Immunologic: Negative.   Neurological: Negative.  Negative for dizziness.  Hematological: Negative for adenopathy. Does not bruise/bleed easily.  Psychiatric/Behavioral: Negative.     Objective:  BP 120/68 (BP Location: Left Arm, Patient Position: Sitting, Cuff Size: Normal)   Pulse 81   Temp 98.1 F (36.7 C) (Oral)   Resp 16   Ht 5\' 2"  (1.575 m)   Wt 123 lb 8 oz (56 kg)   SpO2 98%   BMI 22.59 kg/m   BP Readings from Last 3 Encounters:  08/27/16 120/68  08/26/16 138/70  07/06/16 126/74    Wt Readings from Last 3 Encounters:  08/27/16 123  lb 8 oz (56 kg)  08/26/16 124 lb (56.2 kg)  07/06/16 122 lb (55.3 kg)    Physical Exam  Constitutional: She is oriented to person, place, and time.  Non-toxic appearance. She does not have a sickly appearance. She does not appear ill. No distress.  HENT:  Mouth/Throat: Oropharynx is clear and moist. No oropharyngeal exudate.  Eyes: Conjunctivae are normal. Right eye exhibits no discharge. Left eye exhibits no discharge. No scleral icterus.  Neck: Normal range of motion. Neck supple. No JVD present. No tracheal  deviation present. No thyromegaly present.  Cardiovascular: Normal rate, regular rhythm, normal heart sounds and intact distal pulses.  Exam reveals no gallop and no friction rub.   No murmur heard. EKG --- Sinus  Rhythm  -RSR(V1) -nondiagnostic.   PROBABLY NORMAL- no significant changes when compared to EKG from 2017   Pulmonary/Chest: Effort normal and breath sounds normal. No stridor. No respiratory distress. She has no wheezes. She has no rales. She exhibits no tenderness.    Abdominal: Soft. Normal appearance and bowel sounds are normal. She exhibits no distension and no mass. There is no hepatosplenomegaly or hepatomegaly. There is tenderness in the left upper quadrant. There is no rebound, no guarding and no CVA tenderness.  Musculoskeletal: Normal range of motion. She exhibits no edema, tenderness or deformity.  Lymphadenopathy:    She has no cervical adenopathy.  Neurological: She is oriented to person, place, and time.  Skin: Skin is warm and dry. No rash noted. She is not diaphoretic. No erythema. No pallor.  Vitals reviewed.   Lab Results  Component Value Date   WBC 7.3 08/26/2016   HGB 12.9 08/26/2016   HCT 38.8 08/26/2016   PLT 205.0 08/26/2016   GLUCOSE 95 01/02/2016   CHOL 241 (H) 01/02/2016   TRIG 97.0 01/02/2016   HDL 100.10 01/02/2016   LDLDIRECT 115.1 12/23/2012   LDLCALC 122 (H) 01/02/2016   ALT 15 08/26/2016   AST 24 08/26/2016   NA 140 01/02/2016   K 4.6 01/02/2016   CL 104 01/02/2016   CREATININE 0.86 01/02/2016   BUN 14 01/02/2016   CO2 29 01/02/2016   TSH 1.16 01/02/2016    Dg Chest 2 View  Result Date: 08/26/2016 CLINICAL DATA:  Three days of left upper quadrant pain. History of mitral valve prolapse, diverticulitis, asthma. EXAM: CHEST  2 VIEW COMPARISON:  CT scan of the chest of November 20, 2015 and chest x-ray of November 20, 2015. FINDINGS: The lungs are well-expanded. There is stable biapical pleural thickening. There is no alveolar infiltrate or  pleural effusion. The heart and pulmonary vascularity are normal. The trachea is midline. The bony thorax exhibits no acute abnormality. IMPRESSION: Mild hyperinflation which may be voluntary or may reflect known reactive airway disease. No pneumonia, CHF, nor other acute cardiopulmonary abnormality. Electronically Signed   By: David  Martinique M.D.   On: 08/26/2016 10:15   US Abdomen Limited  Result Date: 08/26/2016 CLINICAL DATA:  Epigastric abdominal pain. EXAM: US ABDOMEN LIMITED - RIGHT UPPER QUADRANT COMPARISON:  Ultrasound of June 03, 2013. FINDINGS: Gallbladder: No gallstones or wall thickening visualized. No sonographic Murphy sign noted by sonographer. Common bile duct: Diameter: 5.4 mm which is within normal limits. Liver: No focal lesion identified. Within normal limits in parenchymal echogenicity. IMPRESSION: No abnormality seen in the right upper quadrant of the abdomen. Electronically Signed   By: Marijo Conception, M.D.   On: 08/26/2016 16:16    Assessment & Plan:  Yanel was seen today for pleurisy.  Diagnoses and all orders for this visit:  Atypical chest pain- Her recent chest x-ray was normal, EKG is negative for any classic findings of pulmonary embolus such as tachycardia or S1/Q3/T3 so I don't think she has a PE. I think she has viral pleurisy. I offered to prescribe something stronger then Tylenol for pain relief but she is not interested in that. -     EKG 12-Lead  Pleurisy- as above  Left upper quadrant abdominal tenderness without rebound tenderness- her labs are within normal limits, she is quite tender to palpation in the LUQ so she will undergo a CT with contrast to screen for splenic infarct, splenic mass, splenomegaly, renal pathology, etc. -     CT ABDOMEN PELVIS W CONTRAST; Future -     CBC with Differential/Platelet; Future -     Comprehensive metabolic panel; Future -     Amylase; Future -     Lipase; Future   I have discontinued Ms. Rogue's  benzonatate. I am also having her maintain her calcium carbonate, acetaminophen, cyanocobalamin, omega-3 acid ethyl esters, saccharomyces boulardii, Biotin, Cholecalciferol (VITAMIN D-3 PO), budesonide-formoterol, loperamide, pantoprazole, and meloxicam.  No orders of the defined types were placed in this encounter.    Follow-up: Return in about 1 day (around 08/28/2016).  Scarlette Calico, MD

## 2016-08-27 NOTE — Telephone Encounter (Signed)
Ok for her to f/up with Dr. Ronnald Ramp. Labs and radiology report so far are negative. Not sure that this point the cause of her pain.

## 2016-08-29 ENCOUNTER — Ambulatory Visit
Admission: RE | Admit: 2016-08-29 | Discharge: 2016-08-29 | Disposition: A | Discharge status: Home / Self Care | Payer: Medicare Other | Source: Ambulatory Visit | Attending: Internal Medicine | Admitting: Internal Medicine

## 2016-08-29 ENCOUNTER — Encounter: Payer: Self-pay | Admitting: Internal Medicine

## 2016-08-29 DIAGNOSIS — R10812 Left upper quadrant abdominal tenderness: Secondary | ICD-10-CM

## 2016-08-29 DIAGNOSIS — R1012 Left upper quadrant pain: Secondary | ICD-10-CM | POA: Diagnosis not present

## 2016-08-29 MED ORDER — IOPAMIDOL (ISOVUE-300) INJECTION 61%
100.0000 mL | Freq: Once | INTRAVENOUS | Status: AC | PRN
  Administered 2016-08-29: 100 mL via INTRAVENOUS

## 2016-08-30 ENCOUNTER — Telehealth: Payer: Self-pay | Admitting: Internal Medicine

## 2016-08-30 NOTE — Telephone Encounter (Signed)
Pt would like a call with results from CT yesterday.

## 2016-09-02 DIAGNOSIS — R0789 Other chest pain: Secondary | ICD-10-CM | POA: Diagnosis not present

## 2016-09-02 DIAGNOSIS — R079 Chest pain, unspecified: Secondary | ICD-10-CM | POA: Diagnosis not present

## 2016-09-02 DIAGNOSIS — R569 Unspecified convulsions: Secondary | ICD-10-CM | POA: Diagnosis not present

## 2016-09-02 DIAGNOSIS — J45909 Unspecified asthma, uncomplicated: Secondary | ICD-10-CM | POA: Diagnosis not present

## 2016-09-02 DIAGNOSIS — R55 Syncope and collapse: Secondary | ICD-10-CM | POA: Diagnosis not present

## 2016-09-02 DIAGNOSIS — I341 Nonrheumatic mitral (valve) prolapse: Secondary | ICD-10-CM | POA: Diagnosis not present

## 2016-09-03 NOTE — Telephone Encounter (Signed)
Called pt and she has read her my chart message.

## 2016-09-06 ENCOUNTER — Other Ambulatory Visit: Payer: Self-pay | Admitting: Internal Medicine

## 2016-09-06 DIAGNOSIS — J4531 Mild persistent asthma with (acute) exacerbation: Secondary | ICD-10-CM

## 2016-09-10 ENCOUNTER — Other Ambulatory Visit: Payer: Self-pay | Admitting: Internal Medicine

## 2016-09-10 ENCOUNTER — Encounter: Payer: Self-pay | Admitting: Internal Medicine

## 2016-09-10 ENCOUNTER — Ambulatory Visit (INDEPENDENT_AMBULATORY_CARE_PROVIDER_SITE_OTHER): Payer: Medicare Other | Admitting: Internal Medicine

## 2016-09-10 VITALS — BP 130/50 | HR 70 | Temp 97.6°F | Resp 16 | Ht 62.0 in | Wt 135.5 lb

## 2016-09-10 DIAGNOSIS — I2510 Atherosclerotic heart disease of native coronary artery without angina pectoris: Secondary | ICD-10-CM | POA: Insufficient documentation

## 2016-09-10 DIAGNOSIS — R0789 Other chest pain: Secondary | ICD-10-CM

## 2016-09-10 DIAGNOSIS — R9431 Abnormal electrocardiogram [ECG] [EKG]: Secondary | ICD-10-CM

## 2016-09-10 NOTE — Progress Notes (Signed)
Subjective:  Patient ID: [name], female    DOB: 1940-06-11  Age: 76 y.o. MRN: [id number]  CC: Chest Pain   HPI MYSHA PEELER presents for f/up - the abd pain has resolved but she now tells me that she was in [facility] 8 days ago when she developed right-sided chest pain that was so severe that she doubled over in pain. She was taken to an ED where she reports that her EKG, CXR, labs were all normal (no records available to me today). Fortunately the pain has resolved but she remains concerned about her heart and asks that I refer her to cardiology.  Outpatient Medications Prior to Visit  Medication Sig Dispense Refill  . acetaminophen (TYLENOL) 500 MG tablet Take 500-1,000 mg by mouth every 6 (six) hours as needed for mild pain or headache.    . Biotin 300 MCG TABS Take 1 tablet by mouth daily.     . budesonide-formoterol (SYMBICORT) 80-4.5 MCG/ACT inhaler Inhale 2 puffs into the lungs 2 (two) times daily. 3 Inhaler 3  . Calcium Carbonate (CALTRATE 600) 1500 MG TABS Take 1 tablet by mouth 2 (two) times daily. chewables    . Cholecalciferol (VITAMIN D-3 PO) Take 400 Units by mouth daily.    . cyanocobalamin 500 MCG tablet Take 500 mcg by mouth every morning.    . loperamide (IMODIUM) 2 MG capsule Take 1 capsule (2 mg total) by mouth 2 (two) times daily as needed for diarrhea or loose stools. 60 capsule 11  . meloxicam (MOBIC) 7.5 MG tablet Take 1 tablet (7.5 mg total) by mouth daily. 30 tablet 0  . omega-3 acid ethyl esters (LOVAZA) 1 G capsule Take 1 g by mouth every morning.    . pantoprazole (PROTONIX) 40 MG tablet Take 1 tablet by mouth  daily before breakfast 90 tablet 3  . saccharomyces boulardii (FLORASTOR) 250 MG capsule Take 1 capsule (250 mg total) by mouth 2 (two) times daily. 60 capsule 0   No facility-administered medications prior to visit.     ROS Review of Systems  Constitutional: Negative for appetite change, diaphoresis, fatigue and unexpected  weight change.  HENT: Negative.  Negative for trouble swallowing.   Eyes: Negative for visual disturbance.  Respiratory: Negative for cough, chest tightness, shortness of breath, wheezing and stridor.   Cardiovascular: Negative for chest pain, palpitations and leg swelling.  Gastrointestinal: Negative for abdominal pain, blood in stool, constipation, diarrhea, nausea and vomiting.  Endocrine: Negative.   Genitourinary: Negative.  Negative for difficulty urinating.  Musculoskeletal: Negative.  Negative for back pain and neck pain.  Skin: Negative.   Allergic/Immunologic: Negative.   Neurological: Negative.  Negative for dizziness, weakness and numbness.  Hematological: Negative for adenopathy. Does not bruise/bleed easily.  Psychiatric/Behavioral: Negative.     Objective:  BP (!) 130/50 (BP Location: Left Arm, Patient Position: Sitting, Cuff Size: Normal)   Pulse 70   Temp 97.6 F (36.4 C) (Oral)   Resp 16   Ht 5\' 2"  (1.575 m)   Wt 135 lb 8 oz (61.5 kg)   SpO2 98%   BMI 24.78 kg/m   BP Readings from Last 3 Encounters:  09/10/16 (!) 130/50  08/27/16 120/68  08/26/16 138/70    Wt Readings from Last 3 Encounters:  09/10/16 135 lb 8 oz (61.5 kg)  08/27/16 123 lb 8 oz (56 kg)  08/26/16 124 lb (56.2 kg)    Physical Exam  Constitutional: She is oriented to person, place, and  time.  Non-toxic appearance. She does not have a sickly appearance. She does not appear ill. No distress.  HENT:  Mouth/Throat: Oropharynx is clear and moist. No oropharyngeal exudate.  Eyes: Conjunctivae are normal. Right eye exhibits no discharge. Left eye exhibits no discharge. No scleral icterus.  Neck: Normal range of motion. Neck supple. No JVD present. No tracheal deviation present. No thyromegaly present.  Cardiovascular: Normal rate, regular rhythm, normal heart sounds and intact distal pulses.  Exam reveals no gallop and no friction rub.   No murmur heard. EKG --  Sinus  Rhythm  -RSR(V1)  -nondiagnostic.   PROBABLY NORMAL- there are no Q waves and no signs of ischemia. There is no tachycardia or S1Q3T3 to suggest PE  Pulmonary/Chest: Effort normal and breath sounds normal. No stridor. No respiratory distress. She has no wheezes. She has no rales. She exhibits no tenderness.  Abdominal: Soft. Bowel sounds are normal. She exhibits no distension and no mass. There is no tenderness. There is no rebound and no guarding.  Musculoskeletal: Normal range of motion. She exhibits no edema, tenderness or deformity.  Lymphadenopathy:    She has no cervical adenopathy.  Neurological: She is oriented to person, place, and time.  Skin: Skin is warm and dry. No rash noted. She is not diaphoretic. No erythema. No pallor.  Vitals reviewed.   Lab Results  Component Value Date   WBC 8.3 08/27/2016   HGB 13.0 08/27/2016   HCT 38.9 08/27/2016   PLT 213.0 08/27/2016   GLUCOSE 109 (H) 08/27/2016   CHOL 241 (H) 01/02/2016   TRIG 97.0 01/02/2016   HDL 100.10 01/02/2016   LDLDIRECT 115.1 12/23/2012   LDLCALC 122 (H) 01/02/2016   ALT 15 08/27/2016   AST 20 08/27/2016   NA 141 08/27/2016   K 4.1 08/27/2016   CL 105 08/27/2016   CREATININE 0.81 08/27/2016   BUN 19 08/27/2016   CO2 28 08/27/2016   TSH 1.16 01/02/2016    Ct Abdomen W Contrast  Result Date: 08/29/2016 CLINICAL DATA:  Left upper quadrant pain. EXAM: CT ABDOMEN WITH CONTRAST TECHNIQUE: Multidetector CT imaging of the abdomen was performed using the standard protocol following bolus administration of intravenous contrast. CONTRAST:  144mL ISOVUE-300 IOPAMIDOL (ISOVUE-300) INJECTION 61% COMPARISON:  06/03/2013 FINDINGS: Lower chest: No acute abnormality. Hepatobiliary: No focal liver abnormality is seen. No gallstones, gallbladder wall thickening, or biliary dilatation. Pancreas: Unremarkable. No pancreatic ductal dilatation or surrounding inflammatory changes. Spleen: Normal in size without focal abnormality. Adrenals/Urinary Tract:  Normal adrenal glands. Small low density structure in the anterior cortex of right kidney is too small to characterize measuring 5 mm, image 18 of series 6. No kidney mass or hydronephrosis. Stomach/Bowel: The stomach appears normal. Visualized small and large bowel loops are unremarkable. Vascular/Lymphatic: Aortic atherosclerosis. No upper abdominal adenopathy. Other: Ventral to the lateral segment of left lobe of liver is a focal fat attenuating structure measuring 2.9 by a 1.2 cm with peripheral enhancement and mild surrounding fat stranding, image number 16 of series 2. The triangular ligament is resides within this area. No free fluid or fluid collections identified. Musculoskeletal: There is mild scoliosis deformity which is convex towards the right. Degenerative disc disease noted IMPRESSION: 1. There is focal inflammation associated with a fat attenuation structure overlying the left lobe of liver. This is favored to represent torsion of a fatty appendage associated with the triangular ligament overlying the liver. 2. No complicating features identified. 3.  Aortic Atherosclerosis (ICD10-I70.0). Electronically Signed  By: Kerby Moors M.D.   On: 08/29/2016 14:44   July 2017  IMPRESSION: No acute finding of the chest CT to account for the patient's symptoms. No evidence of acute aortic syndrome.  Left main and 3 vessel coronary artery disease. Office based assessment may be considered.  Small hiatal hernia.  Aortic atherosclerosis.  Signed    Assessment & Plan:   Jace was seen today for chest pain.  Diagnoses and all orders for this visit:  Atypical chest pain- her pain has resolved, her EKG is reassuring, the pain was most likely pleurisy or MSK and has not recurred -     EKG 12-Lead  Coronary artery disease involving native coronary artery of native heart without angina pectoris- she wants to know if she should undergo and lexiscan so I have referred her to  cardiology -     Ambulatory referral to Cardiology   I am having Ms. Klosinski maintain her calcium carbonate, acetaminophen, cyanocobalamin, omega-3 acid ethyl esters, saccharomyces boulardii, Biotin, Cholecalciferol (VITAMIN D-3 PO), loperamide, pantoprazole, meloxicam, and budesonide-formoterol.  No orders of the defined types were placed in this encounter.    Follow-up: Return in about 2 months (around 11/10/2016).  Scarlette Calico, MD

## 2016-09-10 NOTE — Progress Notes (Signed)
Pre visit review using our clinic review tool, if applicable. No additional management support is needed unless otherwise documented below in the visit note. 

## 2016-09-10 NOTE — Patient Instructions (Signed)
Chest Wall Pain °Chest wall pain is pain in or around the bones and muscles of your chest. Sometimes, an injury causes this pain. Sometimes, the cause may not be known. This pain may take several weeks or longer to get better. °Follow these instructions at home: °Pay attention to any changes in your symptoms. Take these actions to help with your pain: °· Rest as told by your health care provider. °· Avoid activities that cause pain. These include any activities that use your chest muscles or your abdominal and side muscles to lift heavy items. °· If directed, apply ice to the painful area: °¨ Put ice in a plastic bag. °¨ Place a towel between your skin and the bag. °¨ Leave the ice on for 20 minutes, 2-3 times per day. °· Take over-the-counter and prescription medicines only as told by your health care provider. °· Do not use tobacco products, including cigarettes, chewing tobacco, and e-cigarettes. If you need help quitting, ask your health care provider. °· Keep all follow-up visits as told by your health care provider. This is important. °Contact a health care provider if: °· You have a fever. °· Your chest pain becomes worse. °· You have new symptoms. °Get help right away if: °· You have nausea or vomiting. °· You feel sweaty or light-headed. °· You have a cough with phlegm (sputum) or you cough up blood. °· You develop shortness of breath. °This information is not intended to replace advice given to you by your health care provider. Make sure you discuss any questions you have with your health care provider. °Document Released: 04/29/2005 Document Revised: 09/07/2015 Document Reviewed: 07/25/2014 °Elsevier Interactive Patient Education © 2017 [employer] ° °

## 2016-09-17 ENCOUNTER — Ambulatory Visit (INDEPENDENT_AMBULATORY_CARE_PROVIDER_SITE_OTHER): Payer: Medicare Other | Admitting: Cardiovascular Disease

## 2016-09-17 ENCOUNTER — Encounter: Payer: Self-pay | Admitting: Cardiovascular Disease

## 2016-09-17 VITALS — BP 134/72 | HR 70 | Ht 65.0 in | Wt 124.4 lb

## 2016-09-17 DIAGNOSIS — R079 Chest pain, unspecified: Secondary | ICD-10-CM

## 2016-09-17 DIAGNOSIS — E785 Hyperlipidemia, unspecified: Secondary | ICD-10-CM | POA: Diagnosis not present

## 2016-09-17 DIAGNOSIS — I2510 Atherosclerotic heart disease of native coronary artery without angina pectoris: Secondary | ICD-10-CM | POA: Diagnosis not present

## 2016-09-17 NOTE — Patient Instructions (Signed)
Medication Instructions:  Your physician recommends that you continue on your current medications as directed. Please refer to the Current Medication list given to you today.  Labwork: No new orders.   Testing/Procedures: Your physician has requested that you have a lexiscan myoview. For further information please visit [email or web address]. Please follow instruction sheet, as given.  Follow-Up: Your physician wants you to follow-up in: 1 YEAR with Dr Fletcher Anon. You will receive a reminder letter in the mail two months in advance. If you don't receive a letter, please call our office to schedule the follow-up appointment.   Any Other Special Instructions Will Be Listed Below (If Applicable).     If you need a refill on your cardiac medications before your next appointment, please call your pharmacy.

## 2016-09-17 NOTE — Progress Notes (Signed)
Cardiology Office Note   Date:  09/17/2016   ID:  Karla Roman, DOB 05/05/1941, MRN [id number]  PCP:  Janith Lima, MD  Cardiologist:  Fletcher Anon  Chief Complaint  Patient presents with  . Follow-up      History of Present Illness: Karla Roman is a 76 y.o. female who Is here today for a follow-up visit regarding reevaluation of chest pain. She was seen by me last year for atypical chest pain. She had a pharmacologic nuclear stress test which was normal. She has known history of GERD, irritable bowel syndrome and hyperlipidemia. No history of diabetes or hypertension. She is a lifelong nonsmoker. She has family history of coronary artery disease but not prematurely.  CT scan of the chest last year showed no evidence of pulmonary embolism or aortic dissection. She was noted to have aortic and three-vessel coronary artery calcifications. She had an echocardiogram done in September which showed normal LV systolic function, moderately thickened mitral valve leaflets and mild mitral regurgitation.  She had an episode of chest pain about 2 weeks ago while she was [place] the beach. The chest pain was sudden and located substernally with radiation to her back. It was sharp and aching and worse with breathing. She describes mild heartburn. The episode lasted for 10 minutes. She went to the local emergency room there and had negative basic workup including troponin. No recurrent episodes since then.   Past Medical History:  Diagnosis Date  . Allergic rhinitis   . Allergy   . Anemia    in childhood  . Arthritis   . Asthma   . Atrophic vaginitis   . Cataract   . Diverticulitis   . Diverticulosis   . Duodenal ulcer   . Dyspareunia   . Dysplastic nevus 09/2012, 10/2012   right thigh, severe atypia, resected 10/2012 by Dr Jarome Matin  . Esophagitis    grade 1  . Eustachian tube dysfunction   . GERD (gastroesophageal reflux disease)   . Hiatal hernia   . Hx of adenomatous colonic  polyps 02/2009   Tubular adenoma in sigmoid. Diminutive.   . Hyperlipemia   . MVP (mitral valve prolapse)   . Osteopenia   . Schatzki's ring 1995  . Sleep apnea 10/2004   Sleep study by Dr Gwenette Greet. does not need cpap  . Vertigo     Past Surgical History:  Procedure Laterality Date  . ABDOMINAL HYSTERECTOMY  2001   with anterior and posterior vaginal repair  . APPENDECTOMY  1989   ruptured  . BREAST BIOPSY Right 1969   benign  . CERVICAL DISCECTOMY  01/2004   C3-4, C5-6, C6-7  . depuytren's contraction Right 04/2006  . dysplastic nevus  10/2012   right thigh  . ESOPHAGOGASTRODUODENOSCOPY N/A 06/04/2013   Procedure: ESOPHAGOGASTRODUODENOSCOPY (EGD);  Surgeon: Lafayette Dragon, MD;  Location: [facility] ENDOSCOPY;  Service: Endoscopy;  Laterality: N/A;  . LAPAROSCOPY  04/2000   with lysis of adhesions  . right shoulder fracture Right 03/2013   non-displaced, humeral head, non-operative mgt but she developed frozen shoulder.   . SEPTOPLASTY  6389  . SHOULDER SURGERY Left 01/2004   release of adhesive capsulitis  . TONSILLECTOMY  1947     Current Outpatient Prescriptions  Medication Sig Dispense Refill  . acetaminophen (TYLENOL) 500 MG tablet Take 500-1,000 mg by mouth every 6 (six) hours as needed for mild pain or headache.    . Biotin 300 MCG TABS Take 1 tablet by  mouth daily.     . budesonide-formoterol (SYMBICORT) 80-4.5 MCG/ACT inhaler Inhale 2 puffs into the lungs 2 (two) times daily. 3 Inhaler 3  . Calcium Carbonate (CALTRATE 600) 1500 MG TABS Take 1 tablet by mouth 2 (two) times daily. chewables    . Cholecalciferol (VITAMIN D-3 PO) Take 400 Units by mouth daily.    . cyanocobalamin 500 MCG tablet Take 500 mcg by mouth every morning.    . loperamide (IMODIUM) 2 MG capsule Take 1 capsule (2 mg total) by mouth 2 (two) times daily as needed for diarrhea or loose stools. 60 capsule 11  . omega-3 acid ethyl esters (LOVAZA) 1 G capsule Take 1 g by mouth every morning.    . pantoprazole  (PROTONIX) 40 MG tablet Take 1 tablet by mouth  daily before breakfast 90 tablet 3  . saccharomyces boulardii (FLORASTOR) 250 MG capsule Take 1 capsule (250 mg total) by mouth 2 (two) times daily. 60 capsule 0   No current facility-administered medications for this visit.     Allergies:   Codeine; Levaquin [levofloxacin]; and Oxycodone    Social History:  The patient  reports that she has never smoked. She has never used smokeless tobacco. She reports that she does not drink alcohol or use drugs.   Family History:  The patient's family history includes Breast cancer in her maternal aunt; Cancer in her maternal aunt; Coronary artery disease in her brother, brother, father, and mother; Diabetes in her brother and brother; Heart attack in her father; Hypertension in her brother and sister; Hyperthyroidism in her sister; Irritable bowel syndrome in her sister.    ROS:  Please see the history of present illness.   Otherwise, review of systems are positive for none.   All other systems are reviewed and negative.    PHYSICAL EXAM: VS:  BP 134/72   Pulse 70   Ht 5\' 5"  (1.651 m)   Wt 124 lb 6.4 oz (56.4 kg)   BMI 20.70 kg/m  , BMI Body mass index is 20.7 kg/m. GEN: Well nourished, well developed, in no acute distress  HEENT: normal  Neck: no JVD, carotid bruits, or masses Cardiac: RRR; no murmurs, rubs, or gallops,no edema  Respiratory:  clear to auscultation bilaterally, normal work of breathing GI: soft, nontender, nondistended, + BS MS: no deformity or atrophy  Skin: warm and dry, no rash Neuro:  Strength and sensation are intact Psych: euthymic mood, full affect   EKG:  EKG is not ordered today. Recent EKG from earlier this month was reviewed and was overall unremarkable and showed normal sinus rhythm with no significant ST or T wave changes.  Recent Labs: 01/02/2016: TSH 1.16 08/27/2016: ALT 15; BUN 19; Creatinine, Ser 0.81; Hemoglobin 13.0; Platelets 213.0; Potassium 4.1; Sodium  141    Lipid Panel    Component Value Date/Time   CHOL 241 (H) 01/02/2016 1024   TRIG 97.0 01/02/2016 1024   HDL 100.10 01/02/2016 1024   CHOLHDL 2 01/02/2016 1024   VLDL 19.4 01/02/2016 1024   LDLCALC 122 (H) 01/02/2016 1024   LDLDIRECT 115.1 12/23/2012 1015      Wt Readings from Last 3 Encounters:  09/17/16 124 lb 6.4 oz (56.4 kg)  09/10/16 135 lb 8 oz (61.5 kg)  08/27/16 123 lb 8 oz (56 kg)      No flowsheet data found.    ASSESSMENT AND PLAN:  1.  Atypical chest pain: The patient's recent episode of chest pain is atypical with some pleuritic  component. Troponin was negative and EKG showed no acute changes. His she is very concerned about this given her family history. I requested a pharmacologic nuclear stress test. She is not able to exercise on a treadmill.  She is known to have coronary atherosclerosis noted on previous CT scan. Continue treatment of risk factors. She has not been on aspirin due to prior stomach ulcer.  2. Coronary atherosclerosis noted on CT scan: Recommend healthy lifestyle changes.    3. Hyperlipidemia: LDL was 122. However, her HDL was 100. Thus, it is difficult to know if she would benefit from a statin or not.   Disposition:   FU with me in 1 year.   Signed,  Kathlyn Sacramento, MD  09/17/2016 10:55 AM    [place]

## 2016-09-20 ENCOUNTER — Telehealth (HOSPITAL_COMMUNITY): Payer: Self-pay

## 2016-09-20 NOTE — Telephone Encounter (Signed)
Encounter complete. 

## 2016-09-25 ENCOUNTER — Ambulatory Visit (HOSPITAL_COMMUNITY)
Admission: RE | Admit: 2016-09-25 | Discharge: 2016-09-25 | Disposition: A | Discharge status: Home / Self Care | Payer: Medicare Other | Source: Ambulatory Visit | Attending: Cardiovascular Disease | Admitting: Cardiovascular Disease

## 2016-09-25 DIAGNOSIS — R0609 Other forms of dyspnea: Secondary | ICD-10-CM | POA: Diagnosis not present

## 2016-09-25 DIAGNOSIS — R42 Dizziness and giddiness: Secondary | ICD-10-CM | POA: Insufficient documentation

## 2016-09-25 DIAGNOSIS — I2510 Atherosclerotic heart disease of native coronary artery without angina pectoris: Secondary | ICD-10-CM | POA: Diagnosis not present

## 2016-09-25 DIAGNOSIS — Z8249 Family history of ischemic heart disease and other diseases of the circulatory system: Secondary | ICD-10-CM | POA: Diagnosis not present

## 2016-09-25 DIAGNOSIS — R079 Chest pain, unspecified: Secondary | ICD-10-CM

## 2016-09-25 DIAGNOSIS — R002 Palpitations: Secondary | ICD-10-CM | POA: Insufficient documentation

## 2016-09-25 LAB — MYOCARDIAL PERFUSION IMAGING
LV dias vol: 61 mL (ref 46–106)
LV sys vol: 19 mL
Peak HR: 114 {beats}/min
Rest HR: 69 {beats}/min
SDS: 0
SRS: 1
SSS: 1
TID: 1.08

## 2016-09-25 MED ORDER — AMINOPHYLLINE 25 MG/ML IV SOLN
75.0000 mg | Freq: Once | INTRAVENOUS | Status: AC
  Administered 2016-09-25: 75 mg via INTRAVENOUS

## 2016-09-25 MED ORDER — REGADENOSON 0.4 MG/5ML IV SOLN
0.4000 mg | Freq: Once | INTRAVENOUS | Status: AC
  Administered 2016-09-25: 0.4 mg via INTRAVENOUS

## 2016-09-25 MED ORDER — TECHNETIUM TC 99M TETROFOSMIN IV KIT
10.7000 | PACK | Freq: Once | INTRAVENOUS | Status: AC | PRN
  Administered 2016-09-25: 10.7 via INTRAVENOUS
  Filled 2016-09-25: qty 11

## 2016-09-25 MED ORDER — TECHNETIUM TC 99M TETROFOSMIN IV KIT
31.2000 | PACK | Freq: Once | INTRAVENOUS | Status: AC | PRN
  Administered 2016-09-25: 31.2 via INTRAVENOUS
  Filled 2016-09-25: qty 32

## 2016-09-27 ENCOUNTER — Telehealth: Payer: Self-pay | Admitting: Cardiovascular Disease

## 2016-09-27 NOTE — Telephone Encounter (Signed)
Called patient about stress test results. Per Dr. Fletcher Anon, stress test was normal. Patient verbalized understanding. Will forward to Dr. Tyrell Antonio nurse.

## 2016-09-27 NOTE — Telephone Encounter (Signed)
New message    Pt is returning call to [place] about results.

## 2016-10-21 DIAGNOSIS — D1801 Hemangioma of skin and subcutaneous tissue: Secondary | ICD-10-CM | POA: Diagnosis not present

## 2016-10-21 DIAGNOSIS — D225 Melanocytic nevi of trunk: Secondary | ICD-10-CM | POA: Diagnosis not present

## 2016-10-21 DIAGNOSIS — L812 Freckles: Secondary | ICD-10-CM | POA: Diagnosis not present

## 2016-10-21 DIAGNOSIS — L821 Other seborrheic keratosis: Secondary | ICD-10-CM | POA: Diagnosis not present

## 2017-01-06 ENCOUNTER — Encounter: Payer: Self-pay | Admitting: Internal Medicine

## 2017-01-06 ENCOUNTER — Other Ambulatory Visit: Payer: Self-pay | Admitting: Internal Medicine

## 2017-01-06 ENCOUNTER — Other Ambulatory Visit (INDEPENDENT_AMBULATORY_CARE_PROVIDER_SITE_OTHER): Payer: Medicare Other

## 2017-01-06 ENCOUNTER — Ambulatory Visit (INDEPENDENT_AMBULATORY_CARE_PROVIDER_SITE_OTHER): Payer: Medicare Other | Admitting: Internal Medicine

## 2017-01-06 VITALS — BP 138/70 | HR 73 | Temp 97.6°F | Resp 16 | Ht 62.0 in | Wt 128.0 lb

## 2017-01-06 DIAGNOSIS — Z0000 Encounter for general adult medical examination without abnormal findings: Secondary | ICD-10-CM

## 2017-01-06 DIAGNOSIS — I2510 Atherosclerotic heart disease of native coronary artery without angina pectoris: Secondary | ICD-10-CM

## 2017-01-06 DIAGNOSIS — E785 Hyperlipidemia, unspecified: Secondary | ICD-10-CM | POA: Diagnosis not present

## 2017-01-06 LAB — LIPID PANEL
Cholesterol: 238 mg/dL — ABNORMAL HIGH (ref 0–200)
HDL: 90.3 mg/dL (ref >39.00)
LDL Cholesterol: 131 mg/dL — ABNORMAL HIGH (ref 0–99)
NonHDL: 147.2
Total CHOL/HDL Ratio: 3
Triglycerides: 82 mg/dL (ref 0.0–149.0)
VLDL: 16.4 mg/dL (ref 0.0–40.0)

## 2017-01-06 LAB — COMPREHENSIVE METABOLIC PANEL
ALT: 20 U/L (ref 0–35)
AST: 23 U/L (ref 0–37)
Albumin: 4.6 g/dL (ref 3.5–5.2)
Alkaline Phosphatase: 80 U/L (ref 39–117)
BUN: 16 mg/dL (ref 6–23)
CO2: 31 mEq/L (ref 19–32)
Calcium: 9.8 mg/dL (ref 8.4–10.5)
Chloride: 104 mEq/L (ref 96–112)
Creatinine, Ser: 0.78 mg/dL (ref 0.40–1.20)
GFR: 75.99 mL/min (ref >60.00)
Glucose, Bld: 103 mg/dL — ABNORMAL HIGH (ref 70–99)
Potassium: 4.4 mEq/L (ref 3.5–5.1)
Sodium: 140 mEq/L (ref 135–145)
Total Bilirubin: 0.7 mg/dL (ref 0.2–1.2)
Total Protein: 7.4 g/dL (ref 6.0–8.3)

## 2017-01-06 LAB — CBC WITH DIFFERENTIAL/PLATELET
Basophils Absolute: 0 10*3/uL (ref 0.0–0.1)
Basophils Relative: 0.7 % (ref 0.0–3.0)
Eosinophils Absolute: 0.1 10*3/uL (ref 0.0–0.7)
Eosinophils Relative: 1.9 % (ref 0.0–5.0)
HCT: 40.1 % (ref 36.0–46.0)
Hemoglobin: 13.2 g/dL (ref 12.0–15.0)
Lymphocytes Relative: 25.6 % (ref 12.0–46.0)
Lymphs Abs: 1.7 10*3/uL (ref 0.7–4.0)
MCHC: 33 g/dL (ref 30.0–36.0)
MCV: 93.4 fl (ref 78.0–100.0)
Monocytes Absolute: 0.5 10*3/uL (ref 0.1–1.0)
Monocytes Relative: 8.3 % (ref 3.0–12.0)
Neutro Abs: 4.2 10*3/uL (ref 1.4–7.7)
Neutrophils Relative %: 63.5 % (ref 43.0–77.0)
Platelets: 199 10*3/uL (ref 150.0–400.0)
RBC: 4.29 Mil/uL (ref 3.87–5.11)
RDW: 13.3 % (ref 11.5–15.5)
WBC: 6.6 10*3/uL (ref 4.0–10.5)

## 2017-01-06 NOTE — Patient Instructions (Signed)

## 2017-01-06 NOTE — Progress Notes (Signed)
Subjective:  Patient ID: Karla Roman, female    DOB: 1941/03/27  Age: 76 y.o. MRN: [id number]  CC: Annual Exam; Hyperlipidemia; and Coronary Artery Disease   HPI Karla Roman presents for a CPX.  She feels well and offers no complaints. She has had no recent episodes of CP or abd pain.  Past Medical History:  Diagnosis Date  . Allergic rhinitis   . Allergy   . Anemia    in childhood  . Arthritis   . Asthma   . Atrophic vaginitis   . Cataract   . Diverticulitis   . Diverticulosis   . Duodenal ulcer   . Dyspareunia   . Dysplastic nevus 09/2012, 10/2012   right thigh, severe atypia, resected 10/2012 by Dr Jarome Matin  . Esophagitis    grade 1  . Eustachian tube dysfunction   . GERD (gastroesophageal reflux disease)   . Hiatal hernia   . Hx of adenomatous colonic polyps 02/2009   Tubular adenoma in sigmoid. Diminutive.   . Hyperlipemia   . MVP (mitral valve prolapse)   . Osteopenia   . Schatzki's ring 1995  . Sleep apnea 10/2004   Sleep study by Dr Gwenette Greet. does not need cpap  . Vertigo    Past Surgical History:  Procedure Laterality Date  . ABDOMINAL HYSTERECTOMY  2001   with anterior and posterior vaginal repair  . APPENDECTOMY  1989   ruptured  . BREAST BIOPSY Right 1969   benign  . CERVICAL DISCECTOMY  01/2004   C3-4, C5-6, C6-7  . depuytren's contraction Right 04/2006  . dysplastic nevus  10/2012   right thigh  . ESOPHAGOGASTRODUODENOSCOPY N/A 06/04/2013   Procedure: ESOPHAGOGASTRODUODENOSCOPY (EGD);  Surgeon: Lafayette Dragon, MD;  Location: [facility] ENDOSCOPY;  Service: Endoscopy;  Laterality: N/A;  . LAPAROSCOPY  04/2000   with lysis of adhesions  . right shoulder fracture Right 03/2013   non-displaced, humeral head, non-operative mgt but she developed frozen shoulder.   . SEPTOPLASTY  2440  . SHOULDER SURGERY Left 01/2004   release of adhesive capsulitis  . TONSILLECTOMY  1947    reports that she has never smoked. She has never used smokeless tobacco.  She reports that she does not drink alcohol or use drugs. family history includes Breast cancer in her maternal aunt; Cancer in her maternal aunt; Coronary artery disease in her brother, brother, father, and mother; Diabetes in her brother and brother; Heart attack in her father; Hypertension in her brother and sister; Hyperthyroidism in her sister; Irritable bowel syndrome in her sister. Allergies  Allergen Reactions  . Codeine Other (See Comments)    Anxiety, restlessness  . Levaquin [Levofloxacin]     Nausea, vomiting, diarrhea.  . Oxycodone Other (See Comments)    Altered mental status    Outpatient Medications Prior to Visit  Medication Sig Dispense Refill  . acetaminophen (TYLENOL) 500 MG tablet Take 500-1,000 mg by mouth every 6 (six) hours as needed for mild pain or headache.    . Biotin 300 MCG TABS Take 1 tablet by mouth daily.     . Calcium Carbonate (CALTRATE 600) 1500 MG TABS Take 1 tablet by mouth 2 (two) times daily. chewables    . Cholecalciferol (VITAMIN D-3 PO) Take 400 Units by mouth daily.    . cyanocobalamin 500 MCG tablet Take 500 mcg by mouth every morning.    . loperamide (IMODIUM) 2 MG capsule Take 1 capsule (2 mg total) by mouth 2 (two) times daily  as needed for diarrhea or loose stools. 60 capsule 11  . omega-3 acid ethyl esters (LOVAZA) 1 G capsule Take 1 g by mouth every morning.    . pantoprazole (PROTONIX) 40 MG tablet TAKE 1 TABLET BY MOUTH  DAILY BEFORE BREAKFAST 90 tablet 1  . saccharomyces boulardii (FLORASTOR) 250 MG capsule Take 1 capsule (250 mg total) by mouth 2 (two) times daily. 60 capsule 0  . budesonide-formoterol (SYMBICORT) 80-4.5 MCG/ACT inhaler Inhale 2 puffs into the lungs 2 (two) times daily. (Patient not taking: Reported on 01/06/2017) 3 Inhaler 3   No facility-administered medications prior to visit.     ROS Review of Systems  Constitutional: Negative.  Negative for activity change, appetite change, fatigue and unexpected weight change.    HENT: Negative.  Negative for sinus pressure and trouble swallowing.   Eyes: Negative.   Respiratory: Negative.  Negative for cough, shortness of breath and wheezing.   Cardiovascular: Negative for chest pain, palpitations and leg swelling.  Gastrointestinal: Negative for abdominal pain, constipation, diarrhea, nausea and vomiting.  Endocrine: Negative.   Genitourinary: Negative.  Negative for difficulty urinating, dysuria and hematuria.  Musculoskeletal: Negative for arthralgias, myalgias and neck pain.  Skin: Negative.  Negative for color change and rash.  Allergic/Immunologic: Negative.   Neurological: Negative.  Negative for dizziness.  Hematological: Negative.  Negative for adenopathy. Does not bruise/bleed easily.  Psychiatric/Behavioral: Negative.     Objective:  BP 138/70 (BP Location: Left Arm, Patient Position: Sitting, Cuff Size: Normal)   Pulse 73   Temp 97.6 F (36.4 C) (Oral)   Resp 16   Ht 5\' 5"  (1.651 m)   Wt 128 lb (58.1 kg)   SpO2 99%   BMI 21.30 kg/m   BP Readings from Last 3 Encounters:  01/06/17 138/70  09/17/16 134/72  09/10/16 (!) 130/50    Wt Readings from Last 3 Encounters:  01/06/17 128 lb (58.1 kg)  09/25/16 124 lb (56.2 kg)  09/17/16 124 lb 6.4 oz (56.4 kg)    Physical Exam  Constitutional: She is oriented to person, place, and time. No distress.  HENT:  Mouth/Throat: Oropharynx is clear and moist. No oropharyngeal exudate.  Eyes: Conjunctivae are normal. Right eye exhibits no discharge. No scleral icterus.  Neck: Normal range of motion. Neck supple. No JVD present. No thyromegaly present.  Cardiovascular: Normal rate, regular rhythm and intact distal pulses.  Exam reveals no gallop and no friction rub.   No murmur heard. Pulmonary/Chest: Effort normal and breath sounds normal. No respiratory distress. She has no wheezes. She has no rales. She exhibits no tenderness.  Abdominal: Soft. Bowel sounds are normal. She exhibits no distension and  no mass. There is no tenderness. There is no rebound and no guarding.  Genitourinary:  Genitourinary Comments: Breast, GU, rectal exams were deferred at her request  Musculoskeletal: Normal range of motion. She exhibits no edema, tenderness or deformity.  Lymphadenopathy:    She has no cervical adenopathy.  Neurological: She is alert and oriented to person, place, and time.  Skin: Skin is warm and dry. No rash noted. She is not diaphoretic. No erythema. No pallor.  Psychiatric: She has a normal mood and affect. Her behavior is normal. Judgment and thought content normal.  Vitals reviewed.   Lab Results  Component Value Date   WBC 6.6 01/06/2017   HGB 13.2 01/06/2017   HCT 40.1 01/06/2017   PLT 199.0 01/06/2017   GLUCOSE 103 (H) 01/06/2017   CHOL 238 (H) 01/06/2017  TRIG 82.0 01/06/2017   HDL 90.30 01/06/2017   LDLDIRECT 115.1 12/23/2012   LDLCALC 131 (H) 01/06/2017   ALT 20 01/06/2017   AST 23 01/06/2017   NA 140 01/06/2017   K 4.4 01/06/2017   CL 104 01/06/2017   CREATININE 0.78 01/06/2017   BUN 16 01/06/2017   CO2 31 01/06/2017   TSH 1.44 01/06/2017    No results found.  Assessment & Plan:   Jannely was seen today for annual exam, hyperlipidemia and coronary artery disease.  Diagnoses and all orders for this visit:  Hyperlipidemia with target LDL less than 130- her ASCVD risk is 24%, I have asked her to start a statin and asa for CV risk reduction -     Lipid panel; Future -     Thyroid Panel With TSH; Future -     atorvastatin (LIPITOR) 20 MG tablet; Take 1 tablet (20 mg total) by mouth daily.  Coronary artery disease involving native coronary artery of native heart without angina pectoris- she has not had any s/s suggestive of angina, will start asa and statin for risk reduction -     Comprehensive metabolic panel; Future -     CBC with Differential/Platelet; Future -     Lipid panel; Future -     atorvastatin (LIPITOR) 20 MG tablet; Take 1 tablet (20 mg  total) by mouth daily. -     aspirin EC 81 MG tablet; Take 1 tablet (81 mg total) by mouth daily.  Routine general medical examination at a health care facility   I am having Ms. Bowker start on atorvastatin and aspirin EC. I am also having her maintain her calcium carbonate, acetaminophen, cyanocobalamin, omega-3 acid ethyl esters, saccharomyces boulardii, Biotin, Cholecalciferol (VITAMIN D-3 PO), loperamide, budesonide-formoterol, and pantoprazole.  Meds ordered this encounter  Medications  . atorvastatin (LIPITOR) 20 MG tablet    Sig: Take 1 tablet (20 mg total) by mouth daily.    Dispense:  90 tablet    Refill:  1  . aspirin EC 81 MG tablet    Sig: Take 1 tablet (81 mg total) by mouth daily.    Dispense:  90 tablet    Refill:  3   See AVS for instructions about healthy living and anticipatory guidance.  Follow-up: Return in about 6 months (around 07/09/2017).  Scarlette Calico, MD

## 2017-01-07 LAB — THYROID PANEL WITH TSH
Free Thyroxine Index: 2.3 (ref 1.4–3.8)
T3 Uptake: 33 % (ref 22–35)
T4, Total: 7 ug/dL (ref 5.1–11.9)
TSH: 1.44 mIU/L

## 2017-01-07 MED ORDER — ATORVASTATIN CALCIUM 20 MG PO TABS: 20.0000 mg | ORAL_TABLET | Freq: Every day | ORAL | 1 refills | Status: DC

## 2017-01-07 MED ORDER — ASPIRIN EC 81 MG PO TBEC: 81.0000 mg | DELAYED_RELEASE_TABLET | Freq: Every day | ORAL | 3 refills | Status: DC

## 2017-01-07 NOTE — Assessment & Plan Note (Signed)

## 2017-02-25 ENCOUNTER — Ambulatory Visit (INDEPENDENT_AMBULATORY_CARE_PROVIDER_SITE_OTHER): Payer: Medicare Other | Admitting: General Practice

## 2017-02-25 DIAGNOSIS — Z23 Encounter for immunization: Secondary | ICD-10-CM

## 2017-03-12 ENCOUNTER — Encounter: Payer: Self-pay | Admitting: Internal Medicine

## 2017-04-10 DIAGNOSIS — H524 Presbyopia: Secondary | ICD-10-CM | POA: Diagnosis not present

## 2017-04-10 DIAGNOSIS — H2513 Age-related nuclear cataract, bilateral: Secondary | ICD-10-CM | POA: Diagnosis not present

## 2017-04-10 DIAGNOSIS — H5203 Hypermetropia, bilateral: Secondary | ICD-10-CM | POA: Diagnosis not present

## 2017-04-22 ENCOUNTER — Ambulatory Visit: Payer: Medicare Other | Admitting: Internal Medicine

## 2017-04-30 ENCOUNTER — Encounter: Payer: Self-pay | Admitting: Internal Medicine

## 2017-04-30 ENCOUNTER — Other Ambulatory Visit (INDEPENDENT_AMBULATORY_CARE_PROVIDER_SITE_OTHER): Payer: Medicare Other

## 2017-04-30 ENCOUNTER — Ambulatory Visit (INDEPENDENT_AMBULATORY_CARE_PROVIDER_SITE_OTHER): Payer: Medicare Other | Admitting: Internal Medicine

## 2017-04-30 ENCOUNTER — Ambulatory Visit (INDEPENDENT_AMBULATORY_CARE_PROVIDER_SITE_OTHER)
Admission: RE | Admit: 2017-04-30 | Discharge: 2017-04-30 | Disposition: A | Discharge status: Home / Self Care | Payer: Medicare Other | Source: Ambulatory Visit | Attending: Internal Medicine | Admitting: Internal Medicine

## 2017-04-30 VITALS — BP 122/52 | HR 73 | Temp 98.3°F | Resp 16 | Ht 62.0 in | Wt 124.8 lb

## 2017-04-30 DIAGNOSIS — I2510 Atherosclerotic heart disease of native coronary artery without angina pectoris: Secondary | ICD-10-CM

## 2017-04-30 DIAGNOSIS — R9431 Abnormal electrocardiogram [ECG] [EKG]: Secondary | ICD-10-CM

## 2017-04-30 DIAGNOSIS — R0781 Pleurodynia: Secondary | ICD-10-CM

## 2017-04-30 DIAGNOSIS — R079 Chest pain, unspecified: Secondary | ICD-10-CM | POA: Diagnosis not present

## 2017-04-30 DIAGNOSIS — R05 Cough: Secondary | ICD-10-CM | POA: Diagnosis not present

## 2017-04-30 LAB — CBC WITH DIFFERENTIAL/PLATELET
Basophils Absolute: 0.1 10*3/uL (ref 0.0–0.1)
Basophils Relative: 1.1 % (ref 0.0–3.0)
Eosinophils Absolute: 0.2 10*3/uL (ref 0.0–0.7)
Eosinophils Relative: 2.9 % (ref 0.0–5.0)
HCT: 39.7 % (ref 36.0–46.0)
Hemoglobin: 13 g/dL (ref 12.0–15.0)
Lymphocytes Relative: 24.1 % (ref 12.0–46.0)
Lymphs Abs: 1.9 10*3/uL (ref 0.7–4.0)
MCHC: 32.8 g/dL (ref 30.0–36.0)
MCV: 94.3 fl (ref 78.0–100.0)
Monocytes Absolute: 0.8 10*3/uL (ref 0.1–1.0)
Monocytes Relative: 10.1 % (ref 3.0–12.0)
Neutro Abs: 4.9 10*3/uL (ref 1.4–7.7)
Neutrophils Relative %: 61.8 % (ref 43.0–77.0)
Platelets: 212 10*3/uL (ref 150.0–400.0)
RBC: 4.21 Mil/uL (ref 3.87–5.11)
RDW: 13.3 % (ref 11.5–15.5)
WBC: 7.9 10*3/uL (ref 4.0–10.5)

## 2017-04-30 LAB — TROPONIN I: TNIDX: 0.01 ug/l (ref 0.00–0.06)

## 2017-04-30 LAB — D-DIMER, QUANTITATIVE (NOT AT ARMC): D-Dimer, Quant: 0.4 mcg/mL FEU (ref <0.50)

## 2017-04-30 NOTE — Progress Notes (Signed)
Subjective:  Patient ID: Karla Roman, female    DOB: 27-May-1940  Age: 76 y.o. MRN: [id number]  CC: Cough   HPI NEYRA PETTIE presents for a one week history of pain around her left shoulder blade that she describes as an achy sensation that becomes sharp with movement and deep breath.  She has taken Tylenol without much relief from her symptoms.  She also applied a heating pad which did help.  She has chronic neck pain and has had a few episodes of numbness and tingling in her left upper extremity.  She has had a intermittent cough productive of clear phlegm but she denies hemoptysis, shortness of breath, night sweats, fever, or chills.  Outpatient Medications Prior to Visit  Medication Sig Dispense Refill  . acetaminophen (TYLENOL) 500 MG tablet Take 500-1,000 mg by mouth every 6 (six) hours as needed for mild pain or headache.    Marland Kitchen aspirin EC 81 MG tablet Take 1 tablet (81 mg total) by mouth daily. 90 tablet 3  . atorvastatin (LIPITOR) 20 MG tablet Take 1 tablet (20 mg total) by mouth daily. 90 tablet 1  . Biotin 300 MCG TABS Take 1 tablet by mouth daily.     . budesonide-formoterol (SYMBICORT) 80-4.5 MCG/ACT inhaler Inhale 2 puffs into the lungs 2 (two) times daily. 3 Inhaler 3  . Calcium Carbonate (CALTRATE 600) 1500 MG TABS Take 1 tablet by mouth 2 (two) times daily. chewables    . Cholecalciferol (VITAMIN D-3 PO) Take 400 Units by mouth daily.    . cyanocobalamin 500 MCG tablet Take 500 mcg by mouth every morning.    . loperamide (IMODIUM) 2 MG capsule Take 1 capsule (2 mg total) by mouth 2 (two) times daily as needed for diarrhea or loose stools. 60 capsule 11  . omega-3 acid ethyl esters (LOVAZA) 1 G capsule Take 1 g by mouth every morning.    . pantoprazole (PROTONIX) 40 MG tablet TAKE 1 TABLET BY MOUTH  DAILY BEFORE BREAKFAST 90 tablet 1  . saccharomyces boulardii (FLORASTOR) 250 MG capsule Take 1 capsule (250 mg total) by mouth 2 (two) times daily. 60 capsule 0   No  facility-administered medications prior to visit.     ROS Review of Systems  Constitutional: Negative for appetite change, chills, diaphoresis, fatigue and fever.  HENT: Negative.  Negative for sinus pressure, sore throat and trouble swallowing.   Eyes: Negative.   Respiratory: Positive for cough. Negative for chest tightness, shortness of breath and wheezing.   Cardiovascular: Positive for chest pain. Negative for palpitations and leg swelling.  Gastrointestinal: Negative.  Negative for abdominal pain, blood in stool, constipation, diarrhea, nausea and vomiting.  Endocrine: Negative.   Genitourinary: Negative.   Musculoskeletal: Positive for back pain and neck pain. Negative for myalgias and neck stiffness.  Skin: Negative.  Negative for color change and rash.  Allergic/Immunologic: Negative.   Neurological: Positive for numbness. Negative for dizziness, weakness, light-headedness and headaches.  Hematological: Negative for adenopathy. Does not bruise/bleed easily.  Psychiatric/Behavioral: Negative.     Objective:  BP (!) 122/52 (BP Location: Left Arm, Patient Position: Sitting, Cuff Size: Normal)   Pulse 73   Temp 98.3 F (36.8 C) (Oral)   Resp 16   Ht 5\' 5"  (1.651 m)   Wt 124 lb 12 oz (56.6 kg)   SpO2 98%   BMI 20.76 kg/m   BP Readings from Last 3 Encounters:  04/30/17 (!) 122/52  01/06/17 138/70  09/17/16 134/72  Wt Readings from Last 3 Encounters:  04/30/17 124 lb 12 oz (56.6 kg)  01/06/17 128 lb (58.1 kg)  09/25/16 124 lb (56.2 kg)    Physical Exam  Constitutional: She is oriented to person, place, and time. No distress.  HENT:  Mouth/Throat: Oropharynx is clear and moist. No oropharyngeal exudate.  Eyes: Conjunctivae are normal. Left eye exhibits no discharge. No scleral icterus.  Neck: Normal range of motion. Neck supple. No JVD present. No thyromegaly present.  Cardiovascular: Normal rate, regular rhythm and normal heart sounds. Exam reveals no gallop and  no friction rub.  No murmur heard. EKG---  Sinus  Rhythm  -RSR(V1) -nondiagnostic.   PROBABLY NORMAL- no change from the prior EKG  Pulmonary/Chest: Effort normal and breath sounds normal. No respiratory distress. She has no wheezes. She has no rales. She exhibits no tenderness.  Abdominal: Soft. Bowel sounds are normal. She exhibits no distension and no mass. There is no tenderness. There is no guarding.  Musculoskeletal: Normal range of motion. She exhibits no edema, tenderness or deformity.       Cervical back: Normal. She exhibits normal range of motion, no tenderness, no swelling, no edema and no deformity.       Back:  Lymphadenopathy:    She has no cervical adenopathy.  Neurological: She is alert and oriented to person, place, and time.  Skin: Skin is warm and dry. No rash noted. She is not diaphoretic. No erythema. No pallor.  Vitals reviewed.   Lab Results  Component Value Date   WBC 7.9 04/30/2017   HGB 13.0 04/30/2017   HCT 39.7 04/30/2017   PLT 212.0 04/30/2017   GLUCOSE 103 (H) 01/06/2017   CHOL 238 (H) 01/06/2017   TRIG 82.0 01/06/2017   HDL 90.30 01/06/2017   LDLDIRECT 115.1 12/23/2012   LDLCALC 131 (H) 01/06/2017   ALT 20 01/06/2017   AST 23 01/06/2017   NA 140 01/06/2017   K 4.4 01/06/2017   CL 104 01/06/2017   CREATININE 0.78 01/06/2017   BUN 16 01/06/2017   CO2 31 01/06/2017   TSH 1.44 01/06/2017    Dg Chest 2 View  Result Date: 05/01/2017 CLINICAL DATA:  One week of cough and left posterior chest pain. History of pneumonia, asthma, coronary artery disease, nonsmoker. EXAM: CHEST  2 VIEW COMPARISON:  Chest x-ray of August 26, 2016 FINDINGS: The lungs remain mildly hyperinflated. There is no focal infiltrate. There is no pleural effusion. The heart and pulmonary vascularity are normal. There is stable biapical pleural thickening slightly greater on the right. The mediastinum is normal in width. There is calcification in the wall of the descending  thoracic aorta. The bony thorax exhibits no acute abnormality. IMPRESSION: Hyperinflation consistent with known reactive airway disease. No pneumonia, CHF, nor other acute cardiopulmonary abnormality. Thoracic aortic atherosclerosis. Electronically Signed   By: David  Martinique M.D.   On: 05/01/2017 06:55    No results found.  Assessment & Plan:   Verdean was seen today for cough.  Diagnoses and all orders for this visit:  Pleuritic chest pain-exam is unremarkable.  Her white cell count is normal and her d-dimer is negative.  Chest x-ray is within normal limits.  Symptoms, exam, and data are consistent with viral pleurisy.  Will continue symptom relief with Tylenol and a heating pad as needed. -     CBC with Differential/Platelet; Future -     D-dimer, quantitative (not at [facility]); Future -     DG Chest 2 View;  Future  Coronary artery disease involving native coronary artery of native heart without angina pectoris- Her current symptoms are not consistent with angina.  Her EKG is unchanged and her troponin is negative for ischemia. -     EKG 12-Lead  Abnormal electrocardiogram (ECG) (EKG)- as above -     CBC with Differential/Platelet; Future -     Troponin I; Future   I am having Dayton Bailiff. Tirey maintain her calcium carbonate, acetaminophen, cyanocobalamin, omega-3 acid ethyl esters, saccharomyces boulardii, Biotin, Cholecalciferol (VITAMIN D-3 PO), loperamide, budesonide-formoterol, pantoprazole, atorvastatin, and aspirin EC.  No orders of the defined types were placed in this encounter.    Follow-up: Return in about 3 weeks (around 05/21/2017).  Scarlette Calico, MD

## 2017-04-30 NOTE — Patient Instructions (Signed)
Cough, Adult  Coughing is a reflex that clears your throat and your airways. Coughing helps to heal and protect your lungs. It is normal to cough occasionally, but a cough that happens with other symptoms or lasts a long time may be a sign of a condition that needs treatment. A cough may last only 2-3 weeks (acute), or it may last longer than 8 weeks (chronic).  What are the causes?  Coughing is commonly caused by:   Breathing in substances that irritate your lungs.   A viral or bacterial respiratory infection.   Allergies.   Asthma.   Postnasal drip.   Smoking.   Acid backing up from the stomach into the esophagus (gastroesophageal reflux).   Certain medicines.   Chronic lung problems, including COPD (or rarely, lung cancer).   Other medical conditions such as heart failure.    Follow these instructions at home:  Pay attention to any changes in your symptoms. Take these actions to help with your discomfort:   Take medicines only as told by your health care provider.  ? If you were prescribed an antibiotic medicine, take it as told by your health care provider. Do not stop taking the antibiotic even if you start to feel better.  ? Talk with your health care provider before you take a cough suppressant medicine.   Drink enough fluid to keep your urine clear or pale yellow.   If the air is dry, use a cold steam vaporizer or humidifier in your bedroom or your home to help loosen secretions.   Avoid anything that causes you to cough at work or at home.   If your cough is worse at night, try sleeping in a semi-upright position.   Avoid cigarette smoke. If you smoke, quit smoking. If you need help quitting, ask your health care provider.   Avoid caffeine.   Avoid alcohol.   Rest as needed.    Contact a health care provider if:   You have new symptoms.   You cough up pus.   Your cough does not get better after 2-3 weeks, or your cough gets worse.   You cannot control your cough with suppressant  medicines and you are losing sleep.   You develop pain that is getting worse or pain that is not controlled with pain medicines.   You have a fever.   You have unexplained weight loss.   You have night sweats.  Get help right away if:   You cough up blood.   You have difficulty breathing.   Your heartbeat is very fast.  This information is not intended to replace advice given to you by your health care provider. Make sure you discuss any questions you have with your health care provider.  Document Released: 10/26/2010 Document Revised: 10/05/2015 Document Reviewed: 07/06/2014  Elsevier Interactive Patient Education  2018 [employer]

## 2017-05-01 ENCOUNTER — Encounter: Payer: Self-pay | Admitting: Internal Medicine

## 2017-05-02 ENCOUNTER — Telehealth: Payer: Self-pay | Admitting: Internal Medicine

## 2017-05-02 NOTE — Telephone Encounter (Signed)
Patient called in to see if an antibiotic was called in from her last OV on 04/30/17 with Dr. Ronnald Ramp, per the OV note, there was no mention of prescribing an antibiotic, recommendation is to follow up in 3 weeks, patient verbalized understanding, appointment made per patient request, patient reports her cough is better, but still having the pain under her ribs and asked if it will get better as her cough gets better, patient advised as the cough gets better the pain should start easing, but if it gets worse or the cough gets worse to call back to the office, patient agreed.

## 2017-05-19 DIAGNOSIS — M5412 Radiculopathy, cervical region: Secondary | ICD-10-CM | POA: Diagnosis not present

## 2017-05-21 ENCOUNTER — Ambulatory Visit (INDEPENDENT_AMBULATORY_CARE_PROVIDER_SITE_OTHER): Payer: Medicare Other | Admitting: Internal Medicine

## 2017-05-21 ENCOUNTER — Encounter: Payer: Self-pay | Admitting: Internal Medicine

## 2017-05-21 VITALS — BP 140/60 | HR 82 | Temp 98.4°F | Ht 62.0 in | Wt 126.2 lb

## 2017-05-21 DIAGNOSIS — J4520 Mild intermittent asthma, uncomplicated: Secondary | ICD-10-CM

## 2017-05-21 DIAGNOSIS — M4802 Spinal stenosis, cervical region: Secondary | ICD-10-CM | POA: Diagnosis not present

## 2017-05-21 NOTE — Progress Notes (Signed)
Subjective:  Patient ID: Karla Roman, female    DOB: 04-05-1941  Age: 77 y.o. MRN: [id number]  CC: Asthma and Neck Pain   HPI Karla Roman presents for f/up - she feels much better.  She has had no more episodes of cough, wheezing, or shortness of breath.  He tells me since I last saw her she saw an orthopedist for left shoulder pain and was found to have arthritis in her neck that is causing her symptoms.  She has had prior C-spine surgery.  She has been referred back to her neurosurgeon.  She tells me the pain radiates towards her left arm but she denies numbness/weakness/tingling in her upper or lower extremities.  Outpatient Medications Prior to Visit  Medication Sig Dispense Refill  . acetaminophen (TYLENOL) 500 MG tablet Take 500-1,000 mg by mouth every 6 (six) hours as needed for mild pain or headache.    Marland Kitchen aspirin EC 81 MG tablet Take 1 tablet (81 mg total) by mouth daily. 90 tablet 3  . atorvastatin (LIPITOR) 20 MG tablet Take 1 tablet (20 mg total) by mouth daily. 90 tablet 1  . Biotin 300 MCG TABS Take 1 tablet by mouth daily.     . budesonide-formoterol (SYMBICORT) 80-4.5 MCG/ACT inhaler Inhale 2 puffs into the lungs 2 (two) times daily. 3 Inhaler 3  . Calcium Carbonate (CALTRATE 600) 1500 MG TABS Take 1 tablet by mouth 2 (two) times daily. chewables    . Cholecalciferol (VITAMIN D-3 PO) Take 400 Units by mouth daily.    . cyanocobalamin 500 MCG tablet Take 500 mcg by mouth every morning.    . loperamide (IMODIUM) 2 MG capsule Take 1 capsule (2 mg total) by mouth 2 (two) times daily as needed for diarrhea or loose stools. 60 capsule 11  . omega-3 acid ethyl esters (LOVAZA) 1 G capsule Take 1 g by mouth every morning.    . pantoprazole (PROTONIX) 40 MG tablet TAKE 1 TABLET BY MOUTH  DAILY BEFORE BREAKFAST 90 tablet 1  . saccharomyces boulardii (FLORASTOR) 250 MG capsule Take 1 capsule (250 mg total) by mouth 2 (two) times daily. 60 capsule 0   No  facility-administered medications prior to visit.     ROS Review of Systems  Constitutional: Negative.  Negative for diaphoresis, fatigue and fever.  HENT: Negative.   Eyes: Negative.   Respiratory: Negative for cough, chest tightness, shortness of breath and wheezing.   Cardiovascular: Negative for chest pain, palpitations and leg swelling.  Gastrointestinal: Negative for abdominal pain.  Endocrine: Negative.   Genitourinary: Negative.  Negative for difficulty urinating.  Musculoskeletal: Positive for neck pain. Negative for back pain.  Skin: Negative.  Negative for rash.  Neurological: Negative.  Negative for dizziness, weakness, light-headedness and numbness.  Hematological: Negative for adenopathy. Does not bruise/bleed easily.  Psychiatric/Behavioral: Negative.     Objective:  BP 140/60 (BP Location: Left Arm, Patient Position: Sitting, Cuff Size: Normal)   Pulse 82   Temp 98.4 F (36.9 C) (Oral)   Ht 5\' 2"  (1.575 m)   Wt 126 lb 4 oz (57.3 kg)   SpO2 96%   BMI 23.09 kg/m   BP Readings from Last 3 Encounters:  05/21/17 140/60  04/30/17 (!) 122/52  01/06/17 138/70    Wt Readings from Last 3 Encounters:  05/21/17 126 lb 4 oz (57.3 kg)  04/30/17 124 lb 12 oz (56.6 kg)  01/06/17 128 lb (58.1 kg)    Physical Exam  Constitutional: She is  oriented to person, place, and time. No distress.  HENT:  Mouth/Throat: Oropharynx is clear and moist. No oropharyngeal exudate.  Eyes: Conjunctivae are normal. Left eye exhibits no discharge. No scleral icterus.  Neck: Normal range of motion. Neck supple. No thyromegaly present.  Cardiovascular: Normal rate, regular rhythm and normal heart sounds.  No murmur heard. Pulmonary/Chest: Effort normal and breath sounds normal. No respiratory distress. She has no wheezes. She has no rales.  Abdominal: Soft. Bowel sounds are normal. She exhibits no mass. There is no tenderness.  Musculoskeletal: She exhibits no edema, tenderness or  deformity.  Lymphadenopathy:    She has no cervical adenopathy.  Neurological: She is alert and oriented to person, place, and time. She displays no atrophy and normal reflexes. She exhibits normal muscle tone. She displays a negative Romberg sign. Coordination and gait normal.  Reflex Scores:      Tricep reflexes are 1+ on the right side and 1+ on the left side.      Bicep reflexes are 1+ on the right side and 1+ on the left side.      Brachioradialis reflexes are 1+ on the right side and 1+ on the left side.      Patellar reflexes are 1+ on the right side.      Achilles reflexes are 0 on the right side and 0 on the left side. Skin: Skin is warm and dry. No rash noted. She is not diaphoretic. No erythema. No pallor.  Vitals reviewed.   Lab Results  Component Value Date   WBC 7.9 04/30/2017   HGB 13.0 04/30/2017   HCT 39.7 04/30/2017   PLT 212.0 04/30/2017   GLUCOSE 103 (H) 01/06/2017   CHOL 238 (H) 01/06/2017   TRIG 82.0 01/06/2017   HDL 90.30 01/06/2017   LDLDIRECT 115.1 12/23/2012   LDLCALC 131 (H) 01/06/2017   ALT 20 01/06/2017   AST 23 01/06/2017   NA 140 01/06/2017   K 4.4 01/06/2017   CL 104 01/06/2017   CREATININE 0.78 01/06/2017   BUN 16 01/06/2017   CO2 31 01/06/2017   TSH 1.44 01/06/2017    Dg Chest 2 View  Result Date: 05/01/2017 CLINICAL DATA:  One week of cough and left posterior chest pain. History of pneumonia, asthma, coronary artery disease, nonsmoker. EXAM: CHEST  2 VIEW COMPARISON:  Chest x-ray of August 26, 2016 FINDINGS: The lungs remain mildly hyperinflated. There is no focal infiltrate. There is no pleural effusion. The heart and pulmonary vascularity are normal. There is stable biapical pleural thickening slightly greater on the right. The mediastinum is normal in width. There is calcification in the wall of the descending thoracic aorta. The bony thorax exhibits no acute abnormality. IMPRESSION: Hyperinflation consistent with known reactive airway  disease. No pneumonia, CHF, nor other acute cardiopulmonary abnormality. Thoracic aortic atherosclerosis. Electronically Signed   By: David  Martinique M.D.   On: 05/01/2017 06:55    Assessment & Plan:   [name] was seen today for asthma and neck pain.  Diagnoses and all orders for this visit:  Spinal stenosis of cervical region- she has radicular pain but is neurologically intact.  She will follow-up with her neurosurgeon.  Mild intermittent asthma without complication- Her symptoms are well controlled.  She will continue using the LABA/ICS inhaler.   I am having Dayton Bailiff. Lorton maintain her calcium carbonate, acetaminophen, cyanocobalamin, omega-3 acid ethyl esters, saccharomyces boulardii, Biotin, Cholecalciferol (VITAMIN D-3 PO), loperamide, budesonide-formoterol, pantoprazole, atorvastatin, and aspirin EC.  No orders of  the defined types were placed in this encounter.    Follow-up: Return if symptoms worsen or fail to improve.  Scarlette Calico, MD

## 2017-05-21 NOTE — Patient Instructions (Signed)

## 2017-06-03 DIAGNOSIS — M542 Cervicalgia: Secondary | ICD-10-CM | POA: Diagnosis not present

## 2017-06-03 DIAGNOSIS — M4722 Other spondylosis with radiculopathy, cervical region: Secondary | ICD-10-CM | POA: Diagnosis not present

## 2017-06-03 DIAGNOSIS — M5030 Other cervical disc degeneration, unspecified cervical region: Secondary | ICD-10-CM | POA: Diagnosis not present

## 2017-06-03 DIAGNOSIS — Z981 Arthrodesis status: Secondary | ICD-10-CM | POA: Diagnosis not present

## 2017-06-04 ENCOUNTER — Other Ambulatory Visit: Payer: Self-pay | Admitting: Neurosurgery

## 2017-06-04 DIAGNOSIS — Z981 Arthrodesis status: Secondary | ICD-10-CM

## 2017-06-10 ENCOUNTER — Ambulatory Visit
Admission: RE | Admit: 2017-06-10 | Discharge: 2017-06-10 | Disposition: A | Discharge status: Home / Self Care | Payer: Medicare Other | Source: Ambulatory Visit | Attending: Neurosurgery | Admitting: Neurosurgery

## 2017-06-10 DIAGNOSIS — Z981 Arthrodesis status: Secondary | ICD-10-CM

## 2017-06-10 DIAGNOSIS — M4802 Spinal stenosis, cervical region: Secondary | ICD-10-CM | POA: Diagnosis not present

## 2017-06-12 DIAGNOSIS — M4722 Other spondylosis with radiculopathy, cervical region: Secondary | ICD-10-CM | POA: Diagnosis not present

## 2017-06-12 DIAGNOSIS — M50223 Other cervical disc displacement at C6-C7 level: Secondary | ICD-10-CM | POA: Diagnosis not present

## 2017-06-23 DIAGNOSIS — M542 Cervicalgia: Secondary | ICD-10-CM | POA: Diagnosis not present

## 2017-06-23 DIAGNOSIS — M5030 Other cervical disc degeneration, unspecified cervical region: Secondary | ICD-10-CM | POA: Diagnosis not present

## 2017-06-23 DIAGNOSIS — M5412 Radiculopathy, cervical region: Secondary | ICD-10-CM | POA: Diagnosis not present

## 2017-06-23 DIAGNOSIS — Z981 Arthrodesis status: Secondary | ICD-10-CM | POA: Diagnosis not present

## 2017-06-23 DIAGNOSIS — S129XXA Fracture of neck, unspecified, initial encounter: Secondary | ICD-10-CM | POA: Diagnosis not present

## 2017-06-25 ENCOUNTER — Ambulatory Visit (INDEPENDENT_AMBULATORY_CARE_PROVIDER_SITE_OTHER): Payer: Medicare Other | Admitting: Family

## 2017-06-25 ENCOUNTER — Encounter: Payer: Self-pay | Admitting: Family

## 2017-06-25 VITALS — BP 122/70 | HR 83 | Temp 98.4°F | Ht 62.0 in | Wt 127.1 lb

## 2017-06-25 DIAGNOSIS — B958 Unspecified staphylococcus as the cause of diseases classified elsewhere: Secondary | ICD-10-CM | POA: Diagnosis not present

## 2017-06-25 MED ORDER — SULFAMETHOXAZOLE-TRIMETHOPRIM 800-160 MG PO TABS: 1.0000 | ORAL_TABLET | Freq: Two times a day (BID) | ORAL | 0 refills | Status: DC

## 2017-06-25 NOTE — Progress Notes (Signed)
Karla Roman is a 77 y.o. female with the following history as recorded in [employer]:  Patient Active Problem List   Diagnosis Date Noted  . CAD (coronary artery disease) 09/10/2016  . 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 Asthma, mild intermittent 09/05/2015  . IBS (irritable bowel syndrome) 08/03/2015  . Abnormal electrocardiogram (ECG) (EKG) 08/03/2015  . Spinal stenosis of cervical region 10/11/2014  . Hyperlipidemia with target LDL less than 130 12/27/2013  . Duodenal ulcer, with obstruction 06/28/2013  . Renal cyst, right 06/16/2013  . Routine general medical examination at a health care facility 12/15/2010  . MITRAL VALVE PROLAPSE 12/29/2006  . Allergic rhinitis 12/29/2006  . GERD 12/29/2006    Current Outpatient Medications  Medication Sig Dispense Refill  . acetaminophen (TYLENOL) 500 MG tablet Take 500-1,000 mg by mouth every 6 (six) hours as needed for mild pain or headache.    Marland Kitchen amoxicillin (AMOXIL) 400 MG/5ML suspension     . aspirin EC 81 MG tablet Take 1 tablet (81 mg total) by mouth daily. 90 tablet 3  . atorvastatin (LIPITOR) 20 MG tablet Take 1 tablet (20 mg total) by mouth daily. 90 tablet 1  . Biotin 300 MCG TABS Take 1 tablet by mouth daily.     . budesonide-formoterol (SYMBICORT) 80-4.5 MCG/ACT inhaler Inhale 2 puffs into the lungs 2 (two) times daily. 3 Inhaler 3  . Calcium Carbonate (CALTRATE 600) 1500 MG TABS Take 1 tablet by mouth 2 (two) times daily. chewables    . Cholecalciferol (VITAMIN D-3 PO) Take 400 Units by mouth daily.    . cyanocobalamin 500 MCG tablet Take 500 mcg by mouth every morning.    . loperamide (IMODIUM) 2 MG capsule Take 1 capsule (2 mg total) by mouth 2 (two) times daily as needed for diarrhea or loose stools. 60 capsule 11  . omega-3 acid ethyl esters (LOVAZA) 1 G capsule Take 1 g by mouth every morning.    . pantoprazole (PROTONIX) 40 MG tablet TAKE 1 TABLET BY MOUTH  DAILY BEFORE BREAKFAST 90 tablet 1  . saccharomyces  boulardii (FLORASTOR) 250 MG capsule Take 1 capsule (250 mg total) by mouth 2 (two) times daily. 60 capsule 0  . sulfamethoxazole-trimethoprim (BACTRIM DS,SEPTRA DS) 800-160 MG tablet Take 1 tablet by mouth 2 (two) times daily. 10 tablet 0   No current facility-administered medications for this visit.     Allergies: Codeine; Levaquin [levofloxacin]; and Oxycodone  Past Medical History:  Diagnosis Date  . Allergic rhinitis   . Allergy   . Anemia    in childhood  . Arthritis   . Asthma   . Atrophic vaginitis   . Cataract   . Diverticulitis   . Diverticulosis   . Duodenal ulcer   . Dyspareunia   . Dysplastic nevus 09/2012, 10/2012   right thigh, severe atypia, resected 10/2012 by Dr Jarome Matin  . Esophagitis    grade 1  . Eustachian tube dysfunction   . GERD (gastroesophageal reflux disease)   . Hiatal hernia   . Hx of adenomatous colonic polyps 02/2009   Tubular adenoma in sigmoid. Diminutive.   . Hyperlipemia   . MVP (mitral valve prolapse)   . Osteopenia   . Schatzki's ring 1995  . Sleep apnea 10/2004   Sleep study by Dr Gwenette Greet. does not need cpap  . Vertigo     Past Surgical History:  Procedure Laterality Date  . ABDOMINAL HYSTERECTOMY  2001   with anterior and posterior vaginal repair  .  APPENDECTOMY  1989   ruptured  . BREAST BIOPSY Right 1969   benign  . CERVICAL DISCECTOMY  01/2004   C3-4, C5-6, C6-7  . depuytren's contraction Right 04/2006  . dysplastic nevus  10/2012   right thigh  . ESOPHAGOGASTRODUODENOSCOPY N/A 06/04/2013   Procedure: ESOPHAGOGASTRODUODENOSCOPY (EGD);  Surgeon: Lafayette Dragon, MD;  Location: [facility];  Service: Endoscopy;  Laterality: N/A;  . LAPAROSCOPY  04/2000   with lysis of adhesions  . right shoulder fracture Right 03/2013   non-displaced, humeral head, non-operative mgt but she developed frozen shoulder.   . SEPTOPLASTY  7353  . SHOULDER SURGERY Left 01/2004   release of adhesive capsulitis  . TONSILLECTOMY  1947    Family  History  Problem Relation Age of Onset  . Heart attack Father        after years of angina  . Coronary artery disease Father   . Coronary artery disease Mother   . Breast cancer Maternal Aunt   . Cancer Maternal Aunt        breast, colon, ovarian  . Diabetes Brother   . Coronary artery disease Brother   . Hypertension Sister   . Hyperthyroidism Sister   . Irritable bowel syndrome Sister   . Coronary artery disease Brother        PCI-stents  . Diabetes Brother   . Hypertension Brother   . Colon cancer Neg Hx     Social History   Tobacco Use  . Smoking status: Never Smoker  . Smokeless tobacco: Never Used  Substance Use Topics  . Alcohol use: No    Alcohol/week: 0.0 oz    Subjective:  Presents with concerns for "throbbing bump" on left lower lip x 3 days; does have history of cold sores and this feels very different; notes that there was a "hard area" at the site first that has progressed into scabbed lesion of concern today; no fever; is in the process of having root canal completed; no sore throat, no difficulty swallowing;     Objective:  Vitals:   06/25/17 1105  BP: 122/70  Pulse: 83  Temp: 98.4 F (36.9 C)  TempSrc: Oral  SpO2: 98%  Weight: 127 lb 1.9 oz (57.7 kg)  Height: 5\' 2"  (1.575 m)    General: Well developed, well nourished, in no acute distress  Skin : Warm and dry. Scabbed lesion with pustular scab noted at base of lower left lip Head: Normocephalic and atraumatic  Eyes: Sclera and conjunctiva clear; pupils round and reactive to light; extraocular movements intact  Ears: External normal; canals clear; tympanic membranes normal  Oropharynx: Pink, supple. No suspicious lesions  Neck: Supple without thyromegaly, adenopathy  Lungs: Respirations unlabored; clear to auscultation bilaterally without wheeze, rales, rhonchi  Neurologic: Alert and oriented; speech intact; face symmetrical; moves all extremities well; CNII-XII intact without focal deficit    Assessment:  1. Staph infection     Plan:  ? Secondary staph infection at site of original cold sore; no other lesions noted; no facial or lip swelling; recommend warm compresses; Rx for Bactrim DS bid x 5 days; follow-up worse, no better.   No Follow-up on file.  No orders of the defined types were placed in this encounter.   Requested Prescriptions   Signed Prescriptions Disp Refills  . sulfamethoxazole-trimethoprim (BACTRIM DS,SEPTRA DS) 800-160 MG tablet 10 tablet 0    Sig: Take 1 tablet by mouth 2 (two) times daily.

## 2017-07-04 ENCOUNTER — Other Ambulatory Visit: Payer: Self-pay | Admitting: Internal Medicine

## 2017-07-09 ENCOUNTER — Encounter: Payer: Self-pay | Admitting: Internal Medicine

## 2017-07-09 ENCOUNTER — Ambulatory Visit (INDEPENDENT_AMBULATORY_CARE_PROVIDER_SITE_OTHER): Payer: Medicare Other | Admitting: Internal Medicine

## 2017-07-09 ENCOUNTER — Other Ambulatory Visit (INDEPENDENT_AMBULATORY_CARE_PROVIDER_SITE_OTHER): Payer: Medicare Other

## 2017-07-09 VITALS — BP 120/78 | HR 75 | Temp 97.7°F | Resp 16 | Ht 62.0 in | Wt 125.0 lb

## 2017-07-09 DIAGNOSIS — E785 Hyperlipidemia, unspecified: Secondary | ICD-10-CM

## 2017-07-09 DIAGNOSIS — I2510 Atherosclerotic heart disease of native coronary artery without angina pectoris: Secondary | ICD-10-CM

## 2017-07-09 LAB — LIPID PANEL
Cholesterol: 168 mg/dL (ref 0–200)
HDL: 91.8 mg/dL (ref >39.00)
LDL Cholesterol: 64 mg/dL (ref 0–99)
NonHDL: 76.1
Total CHOL/HDL Ratio: 2
Triglycerides: 63 mg/dL (ref 0.0–149.0)
VLDL: 12.6 mg/dL (ref 0.0–40.0)

## 2017-07-09 LAB — COMPREHENSIVE METABOLIC PANEL
ALT: 29 U/L (ref 0–35)
AST: 34 U/L (ref 0–37)
Albumin: 4.4 g/dL (ref 3.5–5.2)
Alkaline Phosphatase: 117 U/L (ref 39–117)
BUN: 17 mg/dL (ref 6–23)
CO2: 31 mEq/L (ref 19–32)
Calcium: 10.1 mg/dL (ref 8.4–10.5)
Chloride: 103 mEq/L (ref 96–112)
Creatinine, Ser: 0.86 mg/dL (ref 0.40–1.20)
GFR: 67.8 mL/min (ref >60.00)
Glucose, Bld: 101 mg/dL — ABNORMAL HIGH (ref 70–99)
Potassium: 4.6 mEq/L (ref 3.5–5.1)
Sodium: 142 mEq/L (ref 135–145)
Total Bilirubin: 1 mg/dL (ref 0.2–1.2)
Total Protein: 7.4 g/dL (ref 6.0–8.3)

## 2017-07-09 NOTE — Progress Notes (Signed)
Subjective:  Patient ID: Karla Roman, female    DOB: 06-10-1940  Age: 77 y.o. MRN: [id number]  CC: Hyperlipidemia   HPI VAUGHN FRIEZE presents for f/up on hypercholesterolemia.  She is tolerating the statin well with no muscle or joint aches.  Outpatient Medications Prior to Visit  Medication Sig Dispense Refill  . acetaminophen (TYLENOL) 500 MG tablet Take 500-1,000 mg by mouth every 6 (six) hours as needed for mild pain or headache.    Marland Kitchen aspirin EC 81 MG tablet Take 1 tablet (81 mg total) by mouth daily. 90 tablet 3  . atorvastatin (LIPITOR) 20 MG tablet Take 1 tablet (20 mg total) by mouth daily. 90 tablet 1  . Biotin 300 MCG TABS Take 1 tablet by mouth daily.     . budesonide-formoterol (SYMBICORT) 80-4.5 MCG/ACT inhaler Inhale 2 puffs into the lungs 2 (two) times daily. 3 Inhaler 3  . Calcium Carbonate (CALTRATE 600) 1500 MG TABS Take 1 tablet by mouth 2 (two) times daily. chewables    . Cholecalciferol (VITAMIN D-3 PO) Take 400 Units by mouth daily.    . cyanocobalamin 500 MCG tablet Take 500 mcg by mouth every morning.    . loperamide (IMODIUM) 2 MG capsule Take 1 capsule (2 mg total) by mouth 2 (two) times daily as needed for diarrhea or loose stools. 60 capsule 11  . omega-3 acid ethyl esters (LOVAZA) 1 G capsule Take 1 g by mouth every morning.    . pantoprazole (PROTONIX) 40 MG tablet TAKE 1 TABLET BY MOUTH  DAILY BEFORE BREAKFAST 90 tablet 1  . saccharomyces boulardii (FLORASTOR) 250 MG capsule Take 1 capsule (250 mg total) by mouth 2 (two) times daily. 60 capsule 0  . amoxicillin (AMOXIL) 400 MG/5ML suspension     . sulfamethoxazole-trimethoprim (BACTRIM DS,SEPTRA DS) 800-160 MG tablet Take 1 tablet by mouth 2 (two) times daily. 10 tablet 0   No facility-administered medications prior to visit.     ROS Review of Systems  Constitutional: Negative.  Negative for appetite change, diaphoresis, fatigue and unexpected weight change.  HENT: Negative.   Eyes:  Negative for visual disturbance.  Respiratory: Negative for cough, chest tightness, shortness of breath and wheezing.   Cardiovascular: Negative for chest pain, palpitations and leg swelling.  Gastrointestinal: Negative for abdominal pain, constipation, diarrhea, nausea and vomiting.  Endocrine: Negative.   Genitourinary: Negative.  Negative for difficulty urinating.  Musculoskeletal: Negative.  Negative for arthralgias, back pain and myalgias.  Skin: Negative.  Negative for pallor and rash.  Allergic/Immunologic: Negative.   Neurological: Negative.  Negative for dizziness, weakness, light-headedness and numbness.  Hematological: Negative for adenopathy. Does not bruise/bleed easily.  Psychiatric/Behavioral: Negative.     Objective:  BP 120/78 (BP Location: Left Arm, Patient Position: Sitting, Cuff Size: Normal)   Pulse 75   Temp 97.7 F (36.5 C) (Oral)   Resp 16   Ht 5\' 2"  (1.575 m)   Wt 125 lb (56.7 kg)   SpO2 99%   BMI 22.86 kg/m   BP Readings from Last 3 Encounters:  07/09/17 120/78  06/25/17 122/70  05/21/17 140/60    Wt Readings from Last 3 Encounters:  07/09/17 125 lb (56.7 kg)  06/25/17 127 lb 1.9 oz (57.7 kg)  05/21/17 126 lb 4 oz (57.3 kg)    Physical Exam  Constitutional: She is oriented to person, place, and time. No distress.  HENT:  Mouth/Throat: Oropharynx is clear and moist. No oropharyngeal exudate.  Eyes: Conjunctivae are normal.  Left eye exhibits no discharge. No scleral icterus.  Neck: Normal range of motion. Neck supple. No JVD present. No thyromegaly present.  Cardiovascular: Normal rate, regular rhythm and normal heart sounds. Exam reveals no gallop.  No murmur heard. Pulmonary/Chest: Effort normal and breath sounds normal. No respiratory distress. She has no wheezes. She has no rales.  Abdominal: Soft. Bowel sounds are normal. She exhibits no distension and no mass. There is no tenderness. There is no guarding.  Musculoskeletal: Normal range of  motion. She exhibits no edema or tenderness.  Lymphadenopathy:    She has no cervical adenopathy.  Neurological: She is alert and oriented to person, place, and time.  Skin: Skin is warm and dry. No rash noted. She is not diaphoretic. No erythema. No pallor.  Vitals reviewed.   Lab Results  Component Value Date   WBC 7.9 04/30/2017   HGB 13.0 04/30/2017   HCT 39.7 04/30/2017   PLT 212.0 04/30/2017   GLUCOSE 101 (H) 07/09/2017   CHOL 168 07/09/2017   TRIG 63.0 07/09/2017   HDL 91.80 07/09/2017   LDLDIRECT 115.1 12/23/2012   LDLCALC 64 07/09/2017   ALT 29 07/09/2017   AST 34 07/09/2017   NA 142 07/09/2017   K 4.6 07/09/2017   CL 103 07/09/2017   CREATININE 0.86 07/09/2017   BUN 17 07/09/2017   CO2 31 07/09/2017   TSH 1.44 01/06/2017    Ct Cervical Spine Wo Contrast  Result Date: 06/10/2017 CLINICAL DATA:  Neck pain radiating to the left shoulder and arm for 2 months. Fell down steps in 03/2017. Prior cervical fusion. EXAM: CT CERVICAL SPINE WITHOUT CONTRAST TECHNIQUE: Multidetector CT imaging of the cervical spine was performed without intravenous contrast. Multiplanar CT image reconstructions were also generated. COMPARISON:  Cervical spine MRI 09/13/2003 and radiographs 06/03/2017 FINDINGS: Alignment: Cervical spine straightening. Facet mediated grade 1 anterolisthesis of C7 on T1 measuring 2 mm, new from the prior MRI. Unchanged 2 mm anterolisthesis of C3 on C4. Skull base and vertebrae: No fracture or suspicious osseous lesion. Mild C1-2 arthropathy. C4-C7 ACDF, new from the prior MRI. Robust interbody osseous fusion at C4-5 and C5-6 as well as facet ankylosis bilaterally at these levels. C6-7 fusion appears less robust with transverse lucency through much of the disc space, however there does appear to be a small amount of solid bridging bone. Soft tissues and spinal canal: No evidence of prevertebral swelling or fluid. Mild calcified atherosclerosis at the carotid bifurcations.  Upper chest: Biapical pleural-parenchymal scarring. Disc levels: C2-3: Mild disc bulging and mild-to-moderate right and severe left facet arthrosis without significant stenosis, similar to the prior MRI. C3-4: Mild disc space narrowing, new from the prior MRI. Interval left facet ankylosis. Small central disc protrusion without significant spinal stenosis, similar to the prior MRI. Severe left facet hypertrophy results in moderate left neural foraminal stenosis, unchanged. C4-5: ACDF.  Left facet hypertrophy.  No stenosis. C5-6: ACDF.  No stenosis. C6-7: ACDF. Left uncovertebral spurring results in moderate residual left neural foraminal stenosis. No spinal stenosis. C7-T1: Mild disc space narrowing. New anterolisthesis and moderate left greater than right facet arthrosis result in mild left neural foraminal stenosis. IMPRESSION: 1. C4-C7 ACDF as above. Moderate residual left neural foraminal stenosis at C6-7. 2. C7-T1 adjacent segment disease with new grade 1 anterolisthesis and mild left neural foraminal stenosis. 3. Unchanged moderate left neural foraminal stenosis at C3-4 due to severe facet arthrosis. Electronically Signed   By: Logan Bores M.D.   On: 06/10/2017 12:29  Assessment & Plan:   Schuyler was seen today for hyperlipidemia.  Diagnoses and all orders for this visit:  Hyperlipidemia with target LDL less than 130- She has achieved her LDL goal and is doing well on the statin.  Will continue at the current dose. -     Comprehensive metabolic panel; Future -     Lipid panel; Future  Coronary artery disease involving native coronary artery of native heart without angina pectoris- She has had no recent episodes of angina.  Will continue risk factor modification. -     Lipid panel; Future   I have discontinued Dayton Bailiff. Milham's amoxicillin and sulfamethoxazole-trimethoprim. I am also having her maintain her calcium carbonate, acetaminophen, cyanocobalamin, omega-3 acid ethyl esters,  saccharomyces boulardii, Biotin, Cholecalciferol (VITAMIN D-3 PO), loperamide, budesonide-formoterol, atorvastatin, aspirin EC, and pantoprazole.  No orders of the defined types were placed in this encounter.    Follow-up: Return in about 6 months (around 01/06/2018).  Scarlette Calico, MD

## 2017-07-09 NOTE — Patient Instructions (Signed)

## 2017-07-15 ENCOUNTER — Telehealth: Payer: Self-pay

## 2017-07-15 DIAGNOSIS — I2510 Atherosclerotic heart disease of native coronary artery without angina pectoris: Secondary | ICD-10-CM

## 2017-07-15 DIAGNOSIS — E785 Hyperlipidemia, unspecified: Secondary | ICD-10-CM

## 2017-07-15 MED ORDER — ATORVASTATIN CALCIUM 20 MG PO TABS: 20.0000 mg | ORAL_TABLET | Freq: Every day | ORAL | 1 refills | Status: DC

## 2017-07-15 NOTE — Telephone Encounter (Signed)
[facility] sent a rf rq for atorvastatin. LOV 07/09/2017. erx sent.

## 2017-07-20 ENCOUNTER — Other Ambulatory Visit: Payer: Self-pay | Admitting: Internal Medicine

## 2017-07-20 DIAGNOSIS — E785 Hyperlipidemia, unspecified: Secondary | ICD-10-CM

## 2017-07-20 DIAGNOSIS — I2510 Atherosclerotic heart disease of native coronary artery without angina pectoris: Secondary | ICD-10-CM

## 2017-08-08 DIAGNOSIS — Z1231 Encounter for screening mammogram for malignant neoplasm of breast: Secondary | ICD-10-CM | POA: Diagnosis not present

## 2017-09-08 ENCOUNTER — Other Ambulatory Visit: Payer: Self-pay | Admitting: Internal Medicine

## 2017-09-08 DIAGNOSIS — E785 Hyperlipidemia, unspecified: Secondary | ICD-10-CM

## 2017-09-08 DIAGNOSIS — I2510 Atherosclerotic heart disease of native coronary artery without angina pectoris: Secondary | ICD-10-CM

## 2017-09-23 ENCOUNTER — Encounter: Payer: Self-pay | Admitting: Cardiovascular Disease

## 2017-09-23 ENCOUNTER — Ambulatory Visit: Payer: Medicare Other | Admitting: Cardiovascular Disease

## 2017-09-23 VITALS — BP 122/66 | HR 76 | Ht 62.0 in | Wt 127.0 lb

## 2017-09-23 DIAGNOSIS — I2510 Atherosclerotic heart disease of native coronary artery without angina pectoris: Secondary | ICD-10-CM

## 2017-09-23 DIAGNOSIS — E785 Hyperlipidemia, unspecified: Secondary | ICD-10-CM

## 2017-09-23 NOTE — Patient Instructions (Signed)
Medication Instructions: Your physician recommends that you continue on your current medications as directed. Please refer to the Current Medication list given to you today.  If you need a refill on your cardiac medications before your next appointment, please call your pharmacy.   Labwork: None  Procedures/Testing: None  Follow-Up: Your physician wants you to follow-up in 12 months with Dr. Fletcher Anon. You will receive a reminder letter in the mail two months in advance. If you don't receive a letter, please call our office at [phone] to schedule this follow-up appointment.   Thank you for choosing [facility]!!

## 2017-09-23 NOTE — Progress Notes (Signed)
Cardiology Office Note   Date:  09/23/2017   ID:  Karla Roman, DOB [birth year], MRN [id number]  PCP:  Janith Lima, MD  Cardiologist:  Fletcher Anon  Chief Complaint  Patient presents with  . Follow-up    1 year.      History of Present Illness: Karla Roman is a 77 y.o. female who Is here today for a follow-up visit regarding recurrent atypical chest pain. She has known history of GERD, irritable bowel syndrome and hyperlipidemia. No history of diabetes or hypertension. She is a lifelong nonsmoker. She has family history of coronary artery disease but not prematurely.  She is known to have three-vessel coronary artery disease calcifications on previous CT scan. Echocardiogram in 2017 showed normal LV systolic function, moderately thickened mitral valve leaflets and mild mitral regurgitation.  She had intermittent episodes of atypical chest pain over the last few years.  Most recent nuclear stress test in May 2018 showed no evidence of ischemia with normal ejection fraction. She has been doing well with no recent chest pain, shortness of breath or palpitations.  She takes her medications regularly and was most recently started on atorvastatin.  Past Medical History:  Diagnosis Date  . Allergic rhinitis   . Allergy   . Anemia    in childhood  . Arthritis   . Asthma   . Atrophic vaginitis   . Cataract   . Diverticulitis   . Diverticulosis   . Duodenal ulcer   . Dyspareunia   . Dysplastic nevus 09/2012, 10/2012   right thigh, severe atypia, resected 10/2012 by Dr Jarome Matin  . Esophagitis    grade 1  . Eustachian tube dysfunction   . GERD (gastroesophageal reflux disease)   . Hiatal hernia   . Hx of adenomatous colonic polyps 02/2009   Tubular adenoma in sigmoid. Diminutive.   . Hyperlipemia   . MVP (mitral valve prolapse)   . Osteopenia   . Schatzki's ring 1995  . Sleep apnea 10/2004   Sleep study by Dr Gwenette Greet. does not need cpap  . Vertigo     Past  Surgical History:  Procedure Laterality Date  . ABDOMINAL HYSTERECTOMY  2001   with anterior and posterior vaginal repair  . APPENDECTOMY  1989   ruptured  . BREAST BIOPSY Right 1969   benign  . CERVICAL DISCECTOMY  01/2004   C3-4, C5-6, C6-7  . depuytren's contraction Right 04/2006  . dysplastic nevus  10/2012   right thigh  . ESOPHAGOGASTRODUODENOSCOPY N/A 06/04/2013   Procedure: ESOPHAGOGASTRODUODENOSCOPY (EGD);  Surgeon: Lafayette Dragon, MD;  Location: [facility] ENDOSCOPY;  Service: Endoscopy;  Laterality: N/A;  . LAPAROSCOPY  04/2000   with lysis of adhesions  . right shoulder fracture Right 03/2013   non-displaced, humeral head, non-operative mgt but she developed frozen shoulder.   . SEPTOPLASTY  7741  . SHOULDER SURGERY Left 01/2004   release of adhesive capsulitis  . TONSILLECTOMY  1947     Current Outpatient Medications  Medication Sig Dispense Refill  . acetaminophen (TYLENOL) 500 MG tablet Take 500-1,000 mg by mouth every 6 (six) hours as needed for mild pain or headache.    Marland Kitchen aspirin EC 81 MG tablet Take 1 tablet (81 mg total) by mouth daily. 90 tablet 3  . atorvastatin (LIPITOR) 20 MG tablet TAKE 1 TABLET BY MOUTH EVERY DAY 90 tablet 1  . atorvastatin (LIPITOR) 20 MG tablet TAKE 1 TABLET BY MOUTH  DAILY 90 tablet 1  .  Biotin 300 MCG TABS Take 1 tablet by mouth daily.     . budesonide-formoterol (SYMBICORT) 80-4.5 MCG/ACT inhaler Inhale 2 puffs into the lungs 2 (two) times daily. 3 Inhaler 3  . Calcium Carbonate (CALTRATE 600) 1500 MG TABS Take 1 tablet by mouth 2 (two) times daily. chewables    . Cholecalciferol (VITAMIN D-3 PO) Take 400 Units by mouth daily.    . cyanocobalamin 500 MCG tablet Take 500 mcg by mouth every morning.    . loperamide (IMODIUM) 2 MG capsule Take 1 capsule (2 mg total) by mouth 2 (two) times daily as needed for diarrhea or loose stools. 60 capsule 11  . omega-3 acid ethyl esters (LOVAZA) 1 G capsule Take 1 g by mouth every morning.    . pantoprazole  (PROTONIX) 40 MG tablet TAKE 1 TABLET BY MOUTH  DAILY BEFORE BREAKFAST 90 tablet 1  . saccharomyces boulardii (FLORASTOR) 250 MG capsule Take 1 capsule (250 mg total) by mouth 2 (two) times daily. 60 capsule 0   No current facility-administered medications for this visit.     Allergies:   Codeine; Levaquin [levofloxacin]; and Oxycodone    Social History:  The patient  reports that she has never smoked. She has never used smokeless tobacco. She reports that she does not drink alcohol or use drugs.   Family History:  The patient's family history includes Breast cancer in her maternal aunt; Cancer in her maternal aunt; Coronary artery disease in her brother, brother, father, and mother; Diabetes in her brother and brother; Heart attack in her father; Hypertension in her brother and sister; Hyperthyroidism in her sister; Irritable bowel syndrome in her sister.    ROS:  Please see the history of present illness.   Otherwise, review of systems are positive for none.   All other systems are reviewed and negative.    PHYSICAL EXAM: VS:  BP 122/66 (BP Location: Left Arm, Patient Position: Sitting, Cuff Size: Normal)   Pulse 76   Ht 5\' 2"  (1.575 m)   Wt 127 lb (57.6 kg)   BMI 23.23 kg/m  , BMI Body mass index is 23.23 kg/m. GEN: Well nourished, well developed, in no acute distress  HEENT: normal  Neck: no JVD, carotid bruits, or masses Cardiac: RRR; no murmurs, rubs, or gallops,no edema  Respiratory:  clear to auscultation bilaterally, normal work of breathing GI: soft, nontender, nondistended, + BS MS: no deformity or atrophy  Skin: warm and dry, no rash Neuro:  Strength and sensation are intact Psych: euthymic mood, full affect   EKG:  EKG is ordered today. EKG today was reviewed and showed normal sinus rhythm with sinus arrhythmia.  No significant ST or T wave changes.  Recent Labs: 01/06/2017: TSH 1.44 04/30/2017: Hemoglobin 13.0; Platelets 212.0 07/09/2017: ALT 29; BUN 17;  Creatinine, Ser 0.86; Potassium 4.6; Sodium 142    Lipid Panel    Component Value Date/Time   CHOL 168 07/09/2017 0948   TRIG 63.0 07/09/2017 0948   HDL 91.80 07/09/2017 0948   CHOLHDL 2 07/09/2017 0948   VLDL 12.6 07/09/2017 0948   LDLCALC 64 07/09/2017 0948   LDLDIRECT 115.1 12/23/2012 1015      Wt Readings from Last 3 Encounters:  09/23/17 127 lb (57.6 kg)  07/09/17 125 lb (56.7 kg)  06/25/17 127 lb 1.9 oz (57.7 kg)      No flowsheet data found.    ASSESSMENT AND PLAN:  1. Coronary atherosclerosis noted on CT scan: Continue with healthy lifestyle changes.  Probably with no anginal symptoms.  Nuclear stress test last year was normal.  2. Hyperlipidemia: She was started on atorvastatin recently with excellent response.  LDL improved from 1 31-64.   Disposition:   FU with me in 1 year.   Signed,  Kathlyn Sacramento, MD  09/23/2017 10:19 AM    [place]

## 2017-10-23 DIAGNOSIS — D0362 Melanoma in situ of left upper limb, including shoulder: Secondary | ICD-10-CM | POA: Diagnosis not present

## 2017-10-23 DIAGNOSIS — D225 Melanocytic nevi of trunk: Secondary | ICD-10-CM | POA: Diagnosis not present

## 2017-10-23 DIAGNOSIS — D1801 Hemangioma of skin and subcutaneous tissue: Secondary | ICD-10-CM | POA: Diagnosis not present

## 2017-10-23 DIAGNOSIS — L821 Other seborrheic keratosis: Secondary | ICD-10-CM | POA: Diagnosis not present

## 2017-11-17 ENCOUNTER — Encounter: Payer: Self-pay | Admitting: Emergency Medicine

## 2017-11-17 ENCOUNTER — Other Ambulatory Visit: Payer: Self-pay

## 2017-11-17 ENCOUNTER — Ambulatory Visit
Admission: EM | Admit: 2017-11-17 | Discharge: 2017-11-17 | Disposition: A | Discharge status: Home / Self Care | Payer: Medicare Other | Attending: Family Medicine | Admitting: Family Medicine

## 2017-11-17 ENCOUNTER — Ambulatory Visit (INDEPENDENT_AMBULATORY_CARE_PROVIDER_SITE_OTHER): Payer: Medicare Other

## 2017-11-17 DIAGNOSIS — S4991XA Unspecified injury of right shoulder and upper arm, initial encounter: Secondary | ICD-10-CM | POA: Diagnosis not present

## 2017-11-17 DIAGNOSIS — S8002XA Contusion of left knee, initial encounter: Secondary | ICD-10-CM | POA: Diagnosis not present

## 2017-11-17 DIAGNOSIS — S40011A Contusion of right shoulder, initial encounter: Secondary | ICD-10-CM

## 2017-11-17 DIAGNOSIS — M25462 Effusion, left knee: Secondary | ICD-10-CM

## 2017-11-17 DIAGNOSIS — M25511 Pain in right shoulder: Secondary | ICD-10-CM | POA: Diagnosis not present

## 2017-11-17 NOTE — Discharge Instructions (Signed)
Apply ice 20 minutes out of every 2 hours 4-5 times daily for comfort.  Use the knee brace for activities.  Perform the quadricep strengthening exercises (leg lifts) as we demonstrated 10 repetitions 4-5 times daily.  Also perform the pendulum exercises that I demonstrated to you for your right shoulder 1 to 2 minutes 3-4 times daily.  Use Tylenol extra strength for pain.  Elevate your knee above the level of your heart several times daily to help control swelling. If You are not improving ,recommend following up with a with an orthopedic surgeon

## 2017-11-17 NOTE — ED Provider Notes (Signed)
[facility]    CSN: [id number] Arrival date & time: 11/17/17  1223     History   Chief Complaint Chief Complaint  Patient presents with  . Knee Pain    left  . Shoulder Pain    right  . Fall    HPI Karla Roman is a 77 y.o. female.   HPI  77 year old female presents stating that she was walking out of a room not paying attention when she tripped on an unlevel portion of the flooring and landed directly onto her left knee.  She thinks that she also may have rolled onto her right shoulder. Since That time she has had pain and swelling over the anterior left knee.  She states that it is difficult to walk but is able to bear weight.  Shoulder hurts mostly anteriorly is worse with forward flexion or abduction of the shoulder.  There does not appear to be bruising.  There is however a large amount of bruising and swelling over the prepatellar area.       Past Medical History:  Diagnosis Date  . Allergic rhinitis   . Allergy   . Anemia    in childhood  . Arthritis   . Asthma   . Atrophic vaginitis   . Cataract   . Diverticulitis   . Diverticulosis   . Duodenal ulcer   . Dyspareunia   . Dysplastic nevus 09/2012, 10/2012   right thigh, severe atypia, resected 10/2012 by Dr Jarome Matin  . Esophagitis    grade 1  . Eustachian tube dysfunction   . GERD (gastroesophageal reflux disease)   . Hiatal hernia   . Hx of adenomatous colonic polyps 02/2009   Tubular adenoma in sigmoid. Diminutive.   . Hyperlipemia   . MVP (mitral valve prolapse)   . Osteopenia   . Schatzki's ring 1995  . Sleep apnea 10/2004   Sleep study by Dr Gwenette Greet. does not need cpap  . Vertigo     Patient Active Problem List   Diagnosis Date Noted  . CAD (coronary artery disease) 09/10/2016  . 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 Asthma, mild intermittent 09/05/2015  . IBS (irritable bowel syndrome) 08/03/2015  . Abnormal electrocardiogram (ECG) (EKG) 08/03/2015  . Spinal stenosis  of cervical region 10/11/2014  . Hyperlipidemia with target LDL less than 130 12/27/2013  . Duodenal ulcer, with obstruction 06/28/2013  . Renal cyst, right 06/16/2013  . Routine general medical examination at a health care facility 12/15/2010  . MITRAL VALVE PROLAPSE 12/29/2006  . Allergic rhinitis 12/29/2006  . GERD 12/29/2006    Past Surgical History:  Procedure Laterality Date  . ABDOMINAL HYSTERECTOMY  2001   with anterior and posterior vaginal repair  . APPENDECTOMY  1989   ruptured  . BREAST BIOPSY Right 1969   benign  . CERVICAL DISCECTOMY  01/2004   C3-4, C5-6, C6-7  . depuytren's contraction Right 04/2006  . dysplastic nevus  10/2012   right thigh  . ESOPHAGOGASTRODUODENOSCOPY N/A 06/04/2013   Procedure: ESOPHAGOGASTRODUODENOSCOPY (EGD);  Surgeon: Lafayette Dragon, MD;  Location: [facility] ENDOSCOPY;  Service: Endoscopy;  Laterality: N/A;  . LAPAROSCOPY  04/2000   with lysis of adhesions  . right shoulder fracture Right 03/2013   non-displaced, humeral head, non-operative mgt but she developed frozen shoulder.   . SEPTOPLASTY  7782  . SHOULDER SURGERY Left 01/2004   release of adhesive capsulitis  . TONSILLECTOMY  1947    OB History   None  Home Medications    Prior to Admission medications   Medication Sig Start Date End Date Taking? Authorizing Provider  acetaminophen (TYLENOL) 500 MG tablet Take 500-1,000 mg by mouth every 6 (six) hours as needed for mild pain or headache.   Yes [provider]  aspirin EC 81 MG tablet Take 1 tablet (81 mg total) by mouth daily. 01/07/17  Yes Janith Lima, MD  atorvastatin (LIPITOR) 20 MG tablet TAKE 1 TABLET BY MOUTH EVERY DAY 07/21/17  Yes Janith Lima, MD  Biotin 300 MCG TABS Take 1 tablet by mouth daily.    Yes [provider]  Calcium Carbonate (CALTRATE 600) 1500 MG TABS Take 1 tablet by mouth 2 (two) times daily. chewables   Yes [provider]  Cholecalciferol (VITAMIN D-3 PO) Take 400 Units  by mouth daily.   Yes [provider]  cyanocobalamin 500 MCG tablet Take 500 mcg by mouth every morning.   Yes [provider]  omega-3 acid ethyl esters (LOVAZA) 1 G capsule Take 1 g by mouth every morning.   Yes [provider]  pantoprazole (PROTONIX) 40 MG tablet TAKE 1 TABLET BY MOUTH  DAILY BEFORE BREAKFAST 07/04/17  Yes Janith Lima, MD  saccharomyces boulardii (FLORASTOR) 250 MG capsule Take 1 capsule (250 mg total) by mouth 2 (two) times daily. 06/01/13  Yes Rama, Venetia Maxon, MD  loperamide (IMODIUM) 2 MG capsule Take 1 capsule (2 mg total) by mouth 2 (two) times daily as needed for diarrhea or loose stools. 01/02/16   Janith Lima, MD    Family History Family History  Problem Relation Age of Onset  . Heart attack Father        after years of angina  . Coronary artery disease Father   . Coronary artery disease Mother   . Breast cancer Maternal Aunt   . Cancer Maternal Aunt        breast, colon, ovarian  . Diabetes Brother   . Coronary artery disease Brother   . Hypertension Sister   . Hyperthyroidism Sister   . Irritable bowel syndrome Sister   . Coronary artery disease Brother        PCI-stents  . Diabetes Brother   . Hypertension Brother   . Colon cancer Neg Hx     Social History Social History   Tobacco Use  . Smoking status: Never Smoker  . Smokeless tobacco: Never Used  Substance Use Topics  . Alcohol use: No    Alcohol/week: 0.0 oz  . Drug use: No     Allergies   Codeine; Levaquin [levofloxacin]; and Oxycodone   Review of Systems Review of Systems  Constitutional: Positive for activity change. Negative for chills, fatigue and fever.  Musculoskeletal: Positive for arthralgias and gait problem.  All other systems reviewed and are negative.    Physical Exam Triage Vital Signs ED Triage Vitals  Enc Vitals Group     BP 11/17/17 1233 (!) 141/64     Pulse Rate 11/17/17 1233 83     Resp 11/17/17 1233 14     Temp  11/17/17 1233 98.2 F (36.8 C)     Temp Source 11/17/17 1233 Oral     SpO2 11/17/17 1233 97 %     Weight 11/17/17 1230 127 lb (57.6 kg)     Height 11/17/17 1230 5\' 2"  (1.575 m)     Head Circumference --      Peak Flow --      Pain  Score 11/17/17 1230 4     Pain Loc --      Pain Edu? --      Excl. in [place]? --    No data found.  Updated Vital Signs BP (!) 141/64 (BP Location: Left Arm)   Pulse 83   Temp 98.2 F (36.8 C) (Oral)   Resp 14   Ht 5\' 2"  (1.575 m)   Wt 127 lb (57.6 kg)   SpO2 97%   BMI 23.23 kg/m   Visual Acuity Right Eye Distance:   Left Eye Distance:   Bilateral Distance:    Right Eye Near:   Left Eye Near:    Bilateral Near:     Physical Exam  Constitutional: She is oriented to person, place, and time. She appears well-developed and well-nourished. No distress.  HENT:  Head: Normocephalic.  Eyes: Pupils are equal, round, and reactive to light. Right eye exhibits no discharge. Left eye exhibits no discharge.  Neck: Normal range of motion.  Musculoskeletal: She exhibits edema and tenderness.  Examination of the right shoulder shows no contusion or ecchymosis present.  There is tenderness over the anterior aspect of the shoulder over the coracoid process and over the anterior deltoid area.  No deformity present.  There is no crepitus present.  Has difficulty with forward flexion of her arm or abduction.  Is able to hold against gravity and also resistance.  Distal pulses are strong as is sensation.  Elbow wrist and finger range of motion are all normal.  Neck is supple there is no decreased range of motion or tenderness.  Examination of the left knee shows a large swelling of the prepatellar area.  This extends up into the suprapatellar area as well.  There is no crepitus present.  There is no obvious disruption of the skin.  Patient's extensor mechanism is intact and functional.  There is a small joint effusion present but the majority of her effusion is  prepatellar.  She does have tenderness over the anterior pole of the patella.  Once again there is no deformity or crepitus present.  She is able to flex her knee to 85 degrees fairly comfortably.  Distal pulses and sensation are intact.  Ligaments are intact.  She has a negative anterior drawer and posterior drawer signs  Neurological: She is alert and oriented to person, place, and time.  Skin: Skin is warm and dry. She is not diaphoretic.  Psychiatric: She has a normal mood and affect. Her behavior is normal. Judgment and thought content normal.  Nursing note and vitals reviewed.    UC Treatments / Results  Labs (all labs ordered are listed, but only abnormal results are displayed) Labs Reviewed - No data to display  EKG None  Radiology Dg Shoulder Right  Result Date: 11/17/2017 CLINICAL DATA:  The patient fell after missing a step and has right shoulder pain centered over the proximal humerus. History of shoulder fracture 2 years ago. EXAM: RIGHT SHOULDER - 2+ VIEW COMPARISON:  Limited views of the right shoulder from a chest x-ray of August 26, 2016 FINDINGS: The bones are subjectively adequately mineralized. No definite acute humeral head fracture is observed. The humeral neck appears intact. The glenohumeral joint space is well maintained. The subacromial subdeltoid space appears normal. The acromion and AC joint exhibit no acute abnormalities. The observed portions of the right clavicle are normal. The upper right ribs also appear normal. IMPRESSION: No acute bony abnormality of the right shoulder is observed. Electronically Signed  By: David  Martinique M.D.   On: 11/17/2017 13:17   Dg Knee Complete 4 Views Left  Result Date: 11/17/2017 CLINICAL DATA:  The patient fell after missing a step. Patient has pain and bruising anteriorly and in the peripatellar region. EXAM: LEFT KNEE - COMPLETE 4+ VIEW COMPARISON:  None in PACs FINDINGS: The bones are subjectively adequately mineralized. There is  a very large amount of pre patellar soft tissue swelling is stenting superiorly and inferiorly. There may be a small joint effusion. The joint spaces are well maintained. There is no acute fracture nor dislocation. IMPRESSION: Large amount of soft tissue swelling anteriorly. Possible small suprapatellar effusion. No acute fracture or dislocation. Electronically Signed   By: David  Martinique M.D.   On: 11/17/2017 13:37    Procedures Procedures (including critical care time)  Medications Ordered in UC Medications - No data to display  Initial Impression / Assessment and Plan / UC Course  I have reviewed the triage vital signs and the nursing notes.  Pertinent labs & imaging results that were available during my care of the patient were reviewed by me and considered in my medical decision making (see chart for details).     Plan: 1. Test/x-ray results and diagnosis reviewed with patient 2. rx as per orders; risks, benefits, potential side effects reviewed with patient 3. Recommend supportive treatment with Apply ice 20 minutes out of every 2 hours 4-5 times daily for comfort.  Elevate the knee above your heart level most of the day to help control swelling.  Was given a knee immobilizer and she will use this for periods of activity.  She may come out of it during quiet times.  She does not need to sleep with the brace.  She is instructed fully in quadricep strengthening exercises as well as pendulum exercises for her right shoulder.   If she is not  improving I recommended that she follow-up with orthopedic surgery.  She will take Tylenol for pain. 4. F/u prn if symptoms worsen or don't improve  Final Clinical Impressions(s) / UC Diagnoses   Final diagnoses:  Effusion of left knee  Contusion of left knee, initial encounter  Contusion of right shoulder, initial encounter     Discharge Instructions     Apply ice 20 minutes out of every 2 hours 4-5 times daily for comfort.  Use the knee brace  for activities.  Perform the quadricep strengthening exercises (leg lifts) as we demonstrated 10 repetitions 4-5 times daily.  Also perform the pendulum exercises that I demonstrated to you for your right shoulder 1 to 2 minutes 3-4 times daily.  Use Tylenol extra strength for pain.  Elevate your knee above the level of your heart several times daily to help control swelling. If You are not improving ,recommend following up with a with an orthopedic surgeon    ED Prescriptions    None     Controlled Substance Prescriptions [place] Controlled Substance Registry consulted? Not Applicable   Lorin Picket, PA-C 11/17/17 1425

## 2017-11-17 NOTE — ED Triage Notes (Signed)
Patient states that she was walking out of a room and tripped on the unlevel flooring and land on her left knee. Patient also reports pain in her right shoulder.

## 2017-11-24 DIAGNOSIS — L988 Other specified disorders of the skin and subcutaneous tissue: Secondary | ICD-10-CM | POA: Diagnosis not present

## 2017-11-24 DIAGNOSIS — S8002XA Contusion of left knee, initial encounter: Secondary | ICD-10-CM | POA: Diagnosis not present

## 2017-11-24 DIAGNOSIS — D0362 Melanoma in situ of left upper limb, including shoulder: Secondary | ICD-10-CM | POA: Diagnosis not present

## 2017-12-09 DIAGNOSIS — S46011A Strain of muscle(s) and tendon(s) of the rotator cuff of right shoulder, initial encounter: Secondary | ICD-10-CM | POA: Diagnosis not present

## 2017-12-09 DIAGNOSIS — S8002XA Contusion of left knee, initial encounter: Secondary | ICD-10-CM | POA: Diagnosis not present

## 2018-01-07 ENCOUNTER — Ambulatory Visit (INDEPENDENT_AMBULATORY_CARE_PROVIDER_SITE_OTHER): Payer: Medicare Other | Admitting: *Deleted

## 2018-01-07 VITALS — BP 112/68 | HR 83 | Resp 17 | Ht 62.0 in | Wt 126.0 lb

## 2018-01-07 DIAGNOSIS — Z0000 Encounter for general adult medical examination without abnormal findings: Secondary | ICD-10-CM

## 2018-01-07 NOTE — Progress Notes (Addendum)
Subjective:   Karla Roman is a 77 y.o. female who presents for Medicare Annual (Subsequent) preventive examination.  Review of Systems:  No ROS.  Medicare Wellness Visit. Additional risk factors are reflected in the social history.  Cardiac Risk Factors include: advanced age (>79men, >50 women);dyslipidemia Sleep patterns: gets up 1-2 times nightly to void and sleeps 6-7 hours nightly.    Home Safety/Smoke Alarms: Feels safe in home. Smoke alarms in place.  Living environment; residence and Firearm Safety: 1-story house/ trailer, no firearms Lives with husband, no needs for DME, good support system. Seat Belt Safety/Bike Helmet: Wears seat belt.      Objective:     Vitals: BP 112/68   Pulse 83   Resp 17   Ht 5\' 2"  (1.575 m)   Wt 126 lb (57.2 kg)   SpO2 99%   BMI 23.05 kg/m   Body mass index is 23.05 kg/m.  Advanced Directives 01/07/2018 11/17/2017 01/07/2017 01/04/2016 11/20/2015 12/21/2014 03/25/2014  Does Patient Have a Medical Advance Directive? Yes Yes Yes Yes Yes Yes No  Type of Paramedic of [place];Living will [place];Living will [place];Living will [place];Living will [place];Living will - -  Does patient want to make changes to medical advance directive? - - - No - Patient declined - - -  Copy of [place] in Chart? No - copy requested - Yes Yes - Yes -  Would patient like information on creating a medical advance directive? - - - - - - No - patient declined information  Pre-existing out of facility DNR order (yellow form or pink MOST form) - - - - - - -    Tobacco Social History   Tobacco Use  Smoking Status Never Smoker  Smokeless Tobacco Never Used     Counseling given: Not Answered  Past Medical History:  Diagnosis Date  . Allergic rhinitis   . Allergy   . Anemia    in childhood  . Arthritis   . Asthma   . Atrophic  vaginitis   . Cataract   . Diverticulitis   . Diverticulosis   . Duodenal ulcer   . Dyspareunia   . Dysplastic nevus 09/2012, 10/2012   right thigh, severe atypia, resected 10/2012 by Dr Jarome Matin  . Esophagitis    grade 1  . Eustachian tube dysfunction   . GERD (gastroesophageal reflux disease)   . Hiatal hernia   . Hx of adenomatous colonic polyps 02/2009   Tubular adenoma in sigmoid. Diminutive.   . Hyperlipemia   . MVP (mitral valve prolapse)   . Osteopenia   . Schatzki's ring 1995  . Sleep apnea 10/2004   Sleep study by Dr Gwenette Greet. does not need cpap  . Vertigo    Past Surgical History:  Procedure Laterality Date  . ABDOMINAL HYSTERECTOMY  2001   with anterior and posterior vaginal repair  . APPENDECTOMY  1989   ruptured  . BREAST BIOPSY Right 1969   benign  . CERVICAL DISCECTOMY  01/2004   C3-4, C5-6, C6-7  . depuytren's contraction Right 04/2006  . dysplastic nevus  10/2012   right thigh  . ESOPHAGOGASTRODUODENOSCOPY N/A 06/04/2013   Procedure: ESOPHAGOGASTRODUODENOSCOPY (EGD);  Surgeon: Lafayette Dragon, MD;  Location: [facility] ENDOSCOPY;  Service: Endoscopy;  Laterality: N/A;  . LAPAROSCOPY  04/2000   with lysis of adhesions  . right shoulder fracture Right 03/2013   non-displaced, humeral  head, non-operative mgt but she developed frozen shoulder.   . SEPTOPLASTY  6387  . SHOULDER SURGERY Left 01/2004   release of adhesive capsulitis  . TONSILLECTOMY  1947   Family History  Problem Relation Age of Onset  . Heart attack Father        after years of angina  . Coronary artery disease Father   . Coronary artery disease Mother   . Breast cancer Maternal Aunt   . Cancer Maternal Aunt        breast, colon, ovarian  . Diabetes Brother   . Coronary artery disease Brother   . Hypertension Sister   . Hyperthyroidism Sister   . Irritable bowel syndrome Sister   . Coronary artery disease Brother        PCI-stents  . Diabetes Brother   . Hypertension Brother   . Colon  cancer Neg Hx    Social History   Socioeconomic History  . Marital status: Married    Spouse name: Karla Roman  . Number of children: 2  . Years of education: 105  . Highest education level: Not on file  Occupational History  . Occupation: Retired from [employer]  . Financial resource strain: Not hard at all  . Food insecurity:    Worry: Never true    Inability: Never true  . Transportation needs:    Medical: No    Non-medical: No  Tobacco Use  . Smoking status: Never Smoker  . Smokeless tobacco: Never Used  Substance and Sexual Activity  . Alcohol use: No    Alcohol/week: 0.0 standard drinks  . Drug use: No  . Sexual activity: Not Currently  Lifestyle  . Physical activity:    Days per week: 0 days    Minutes per session: 0 min  . Stress: Not at all  Relationships  . Social connections:    Talks on phone: More than three times a week    Gets together: More than three times a week    Attends religious service: Never    Active member of club or organization: Yes    Attends meetings of clubs or organizations: More than 4 times per year    Relationship status: Married  Other Topics Concern  . Not on file  Social History Narrative   HSG. Married 1959. 1 son - ''54; 1 dtr - '80; 6 grandchildren (1 set of twins). Marriage is in good health - SO with rectal cancer. Work - retired.    Sister in SNF; ? Dementia;   Taking care of grand kids;    Lives in [place]; bedrooms on 1st floor    Outpatient Encounter Medications as of 01/07/2018  Medication Sig  . acetaminophen (TYLENOL) 500 MG tablet Take 500-1,000 mg by mouth every 6 (six) hours as needed for mild pain or headache.  Marland Kitchen aspirin EC 81 MG tablet Take 1 tablet (81 mg total) by mouth daily.  Marland Kitchen atorvastatin (LIPITOR) 20 MG tablet TAKE 1 TABLET BY MOUTH EVERY DAY  . Biotin 300 MCG TABS Take 1 tablet by mouth daily.   . Calcium Carbonate (CALTRATE 600) 1500 MG TABS Take 1 tablet by mouth 2 (two) times  daily. chewables  . Cholecalciferol (VITAMIN D-3 PO) Take 400 Units by mouth daily.  . cyanocobalamin 500 MCG tablet Take 500 mcg by mouth every morning.  . loperamide (IMODIUM) 2 MG capsule Take 1 capsule (2 mg total) by mouth 2 (two) times daily as needed for diarrhea  or loose stools.  Marland Kitchen omega-3 acid ethyl esters (LOVAZA) 1 G capsule Take 1 g by mouth every morning.  . pantoprazole (PROTONIX) 40 MG tablet TAKE 1 TABLET BY MOUTH  DAILY BEFORE BREAKFAST  . saccharomyces boulardii (FLORASTOR) 250 MG capsule Take 1 capsule (250 mg total) by mouth 2 (two) times daily.   No facility-administered encounter medications on file as of 01/07/2018.     Activities of Daily Living In your present state of health, do you have any difficulty performing the following activities: 01/07/2018  Hearing? N  Vision? N  Difficulty concentrating or making decisions? N  Walking or climbing stairs? N  Dressing or bathing? N  Doing errands, shopping? N  Preparing Food and eating ? N  Using the Toilet? N  In the past six months, have you accidently leaked urine? N  Do you have problems with loss of bowel control? N  Managing your Medications? N  Managing your Finances? N  Housekeeping or managing your Housekeeping? N  Some recent data might be hidden    Patient Care Team: Janith Lima, MD as PCP - General (Internal Medicine) Lafayette Dragon, MD (Inactive) (Gastroenterology) Stark Klein, MD as Consulting Physician (General Surgery) Jarome Matin, MD as Consulting Physician (Dermatology) Jarome Matin, MD as Consulting Physician (Dermatology)    Assessment:   This is a routine wellness examination for [employer]. Physical assessment deferred to PCP.   Exercise Activities and Dietary recommendations Current Exercise Habits: Home exercise routine, Type of exercise: walking;calisthenics, Time (Minutes): 40, Intensity: Mild, Exercise limited by: orthopedic condition(s)  Diet (meal preparation, eat out, water  intake, caffeinated beverages, dairy products, fruits and vegetables): in general, a "healthy" diet  , well balanced   Reviewed heart healthy diet. Encouraged patient to increase daily water and healthy fluid intake.   Goals      Patient Stated   . maintain health (pt-stated)     Would feel better if she loses weight; weight is normal; May cut back on unhealthy food; hamburger, french fries      Other   . Patient Stated     Start to walk again, begin slow gradually build. Enjoy life and family.       Fall Risk Fall Risk  01/07/2017 01/04/2016 01/01/2015 12/29/2014 12/21/2014  Falls in the past year? No No No No No  Comment - - - - broke shoulder x 2 years ago    Depression Screen PHQ 2/9 Scores 01/07/2018 01/07/2017 01/04/2016 01/01/2015  PHQ - 2 Score 0 0 0 0     Cognitive Function MMSE - Mini Mental State Exam 01/07/2018 12/21/2014  Not completed: - Unable to complete  Orientation to time 5 -  Orientation to Place 5 -  Registration 3 -  Attention/ Calculation 5 -  Recall 2 -  Language- name 2 objects 2 -  Language- repeat 1 -  Language- follow 3 step command 3 -  Language- read & follow direction 1 -  Write a sentence 1 -  Copy design 1 -  Total score 29 -        Immunization History  Administered Date(s) Administered  . Influenza Split 02/20/2012  . Influenza Whole 05/15/1998, 02/26/2008, 02/08/2009, 02/16/2010  . Influenza, High Dose Seasonal PF 02/08/2016, 02/25/2017  . Influenza,inj,Quad PF,6+ Mos 02/10/2013, 02/11/2014, 02/10/2015  . Pneumococcal Conjugate-13 02/26/2008, 02/11/2014  . Pneumococcal Polysaccharide-23 02/11/2003, 12/13/2010  . Td 12/08/2007  . Zoster 09/23/2005   Screening Tests Health Maintenance  Topic Date Due  .  TETANUS/TDAP  12/07/2017  . INFLUENZA VACCINE  12/11/2017  . DEXA SCAN  Completed  . PNA vac Low Risk Adult  Completed      Plan:    Continue doing brain stimulating activities (puzzles, reading, adult coloring books, staying  active) to keep memory sharp.   Continue to eat heart healthy diet (full of fruits, vegetables, whole grains, lean protein, water--limit salt, fat, and sugar intake) and increase physical activity as tolerated.  I have personally reviewed and noted the following in the patient's chart:   . Medical and social history . Use of alcohol, tobacco or illicit drugs  . Current medications and supplements . Functional ability and status . Nutritional status . Physical activity . Advanced directives . List of other physicians . Vitals . Screenings to include cognitive, depression, and falls . Referrals and appointments  In addition, I have reviewed and discussed with patient certain preventive protocols, quality metrics, and best practice recommendations. A written personalized care plan for preventive services as well as general preventive health recommendations were provided to patient.     Michiel Cowboy, RN  01/07/2018  Medical screening examination/treatment/procedure(s) were performed by non-physician practitioner and as supervising physician I was immediately available for consultation/collaboration. I agree with above. Scarlette Calico, MD

## 2018-01-07 NOTE — Patient Instructions (Addendum)
Continue doing brain stimulating activities (puzzles, reading, adult coloring books, staying active) to keep memory sharp.   Continue to eat heart healthy diet (full of fruits, vegetables, whole grains, lean protein, water--limit salt, fat, and sugar intake) and increase physical activity as tolerated.   Karla Roman , Thank you for taking time to come for your Medicare Wellness Visit. I appreciate your ongoing commitment to your health goals. Please review the following plan we discussed and let me know if I can assist you in the future.   These are the goals we discussed: Goals      Patient Stated   . maintain health (pt-stated)     Would feel better if she loses weight; weight is normal; May cut back on unhealthy food; hamburger, french fries      Other   . Patient Stated     Start to walk again, begin slow gradually build. Enjoy life and family.       This is a list of the screening recommended for you and due dates:  Health Maintenance  Topic Date Due  . Tetanus Vaccine  12/07/2017  . Flu Shot  12/11/2017  . DEXA scan (bone density measurement)  Completed  . Pneumonia vaccines  Completed   Health Maintenance, Female Adopting a healthy lifestyle and getting preventive care can go a long way to promote health and wellness. Talk with your health care provider about what schedule of regular examinations is right for you. This is a good chance for you to check in with your provider about disease prevention and staying healthy. In between checkups, there are plenty of things you can do on your own. Experts have done a lot of research about which lifestyle changes and preventive measures are most likely to keep you healthy. Ask your health care provider for more information. Weight and diet Eat a healthy diet  Be sure to include plenty of vegetables, fruits, low-fat dairy products, and lean protein.  Do not eat a lot of foods high in solid fats, added sugars, or salt.  Get regular  exercise. This is one of the most important things you can do for your health. ? Most adults should exercise for at least 150 minutes each week. The exercise should increase your heart rate and make you sweat (moderate-intensity exercise). ? Most adults should also do strengthening exercises at least twice a week. This is in addition to the moderate-intensity exercise.  Maintain a healthy weight  Body mass index (BMI) is a measurement that can be used to identify possible weight problems. It estimates body fat based on height and weight. Your health care provider can help determine your BMI and help you achieve or maintain a healthy weight.  For females 52 years of age and older: ? A BMI below 18.5 is considered underweight. ? A BMI of 18.5 to 24.9 is normal. ? A BMI of 25 to 29.9 is considered overweight. ? A BMI of 30 and above is considered obese.  Watch levels of cholesterol and blood lipids  You should start having your blood tested for lipids and cholesterol at 78 years of age, then have this test every 5 years.  You may need to have your cholesterol levels checked more often if: ? Your lipid or cholesterol levels are high. ? You are older than 78 years of age. ? You are at high risk for heart disease.  Cancer screening Lung Cancer  Lung cancer screening is recommended for adults 36-30 years old  who are at high risk for lung cancer because of a history of smoking.  A yearly low-dose CT scan of the lungs is recommended for people who: ? Currently smoke. ? Have quit within the past 15 years. ? Have at least a 30-pack-year history of smoking. A pack year is smoking an average of one pack of cigarettes a day for 1 year.  Yearly screening should continue until it has been 15 years since you quit.  Yearly screening should stop if you develop a health problem that would prevent you from having lung cancer treatment.  Breast Cancer  Practice breast self-awareness. This means  understanding how your breasts normally appear and feel.  It also means doing regular breast self-exams. Let your health care provider know about any changes, no matter how small.  If you are in your 20s or 30s, you should have a clinical breast exam (CBE) by a health care provider every 1-3 years as part of a regular health exam.  If you are 12 or older, have a CBE every year. Also consider having a breast X-ray (mammogram) every year.  If you have a family history of breast cancer, talk to your health care provider about genetic screening.  If you are at high risk for breast cancer, talk to your health care provider about having an MRI and a mammogram every year.  Breast cancer gene (BRCA) assessment is recommended for women who have family members with BRCA-related cancers. BRCA-related cancers include: ? Breast. ? Ovarian. ? Tubal. ? Peritoneal cancers.  Results of the assessment will determine the need for genetic counseling and BRCA1 and BRCA2 testing.  Cervical Cancer Your health care provider may recommend that you be screened regularly for cancer of the pelvic organs (ovaries, uterus, and vagina). This screening involves a pelvic examination, including checking for microscopic changes to the surface of your cervix (Pap test). You may be encouraged to have this screening done every 3 years, beginning at age 50.  For women ages 13-65, health care providers may recommend pelvic exams and Pap testing every 3 years, or they may recommend the Pap and pelvic exam, combined with testing for human papilloma virus (HPV), every 5 years. Some types of HPV increase your risk of cervical cancer. Testing for HPV may also be done on women of any age with unclear Pap test results.  Other health care providers may not recommend any screening for nonpregnant women who are considered low risk for pelvic cancer and who do not have symptoms. Ask your health care provider if a screening pelvic exam is  right for you.  If you have had past treatment for cervical cancer or a condition that could lead to cancer, you need Pap tests and screening for cancer for at least 20 years after your treatment. If Pap tests have been discontinued, your risk factors (such as having a new sexual partner) need to be reassessed to determine if screening should resume. Some women have medical problems that increase the chance of getting cervical cancer. In these cases, your health care provider may recommend more frequent screening and Pap tests.  Colorectal Cancer  This type of cancer can be detected and often prevented.  Routine colorectal cancer screening usually begins at 78 years of age and continues through 78 years of age.  Your health care provider may recommend screening at an earlier age if you have risk factors for colon cancer.  Your health care provider may also recommend using home test kits  to check for hidden blood in the stool.  A small camera at the end of a tube can be used to examine your colon directly (sigmoidoscopy or colonoscopy). This is done to check for the earliest forms of colorectal cancer.  Routine screening usually begins at age 58.  Direct examination of the colon should be repeated every 5-10 years through 78 years of age. However, you may need to be screened more often if early forms of precancerous polyps or small growths are found.  Skin Cancer  Check your skin from head to toe regularly.  Tell your health care provider about any new moles or changes in moles, especially if there is a change in a mole's shape or color.  Also tell your health care provider if you have a mole that is larger than the size of a pencil eraser.  Always use sunscreen. Apply sunscreen liberally and repeatedly throughout the day.  Protect yourself by wearing long sleeves, pants, a wide-brimmed hat, and sunglasses whenever you are outside.  Heart disease, diabetes, and high blood  pressure  High blood pressure causes heart disease and increases the risk of stroke. High blood pressure is more likely to develop in: ? People who have blood pressure in the high end of the normal range (130-139/85-89 mm Hg). ? People who are overweight or obese. ? People who are African American.  If you are 71-18 years of age, have your blood pressure checked every 3-5 years. If you are 44 years of age or older, have your blood pressure checked every year. You should have your blood pressure measured twice-once when you are at a hospital or clinic, and once when you are not at a hospital or clinic. Record the average of the two measurements. To check your blood pressure when you are not at a hospital or clinic, you can use: ? An automated blood pressure machine at a pharmacy. ? A home blood pressure monitor.  If you are between 60 years and 93 years old, ask your health care provider if you should take aspirin to prevent strokes.  Have regular diabetes screenings. This involves taking a blood sample to check your fasting blood sugar level. ? If you are at a normal weight and have a low risk for diabetes, have this test once every three years after 78 years of age. ? If you are overweight and have a high risk for diabetes, consider being tested at a younger age or more often. Preventing infection Hepatitis B  If you have a higher risk for hepatitis B, you should be screened for this virus. You are considered at high risk for hepatitis B if: ? You were born in a country where hepatitis B is common. Ask your health care provider which countries are considered high risk. ? Your parents were born in a high-risk country, and you have not been immunized against hepatitis B (hepatitis B vaccine). ? You have HIV or AIDS. ? You use needles to inject street drugs. ? You live with someone who has hepatitis B. ? You have had sex with someone who has hepatitis B. ? You get hemodialysis  treatment. ? You take certain medicines for conditions, including cancer, organ transplantation, and autoimmune conditions.  Hepatitis C  Blood testing is recommended for: ? Everyone born from [birth year] through 1966. ? Anyone with known risk factors for hepatitis C.  Sexually transmitted infections (STIs)  You should be screened for sexually transmitted infections (STIs) including gonorrhea and chlamydia if: ?  You are sexually active and are younger than 78 years of age. ? You are older than 78 years of age and your health care provider tells you that you are at risk for this type of infection. ? Your sexual activity has changed since you were last screened and you are at an increased risk for chlamydia or gonorrhea. Ask your health care provider if you are at risk.  If you do not have HIV, but are at risk, it may be recommended that you take a prescription medicine daily to prevent HIV infection. This is called pre-exposure prophylaxis (PrEP). You are considered at risk if: ? You are sexually active and do not regularly use condoms or know the HIV status of your partner(s). ? You take drugs by injection. ? You are sexually active with a partner who has HIV.  Talk with your health care provider about whether you are at high risk of being infected with HIV. If you choose to begin PrEP, you should first be tested for HIV. You should then be tested every 3 months for as long as you are taking PrEP. Pregnancy  If you are premenopausal and you may become pregnant, ask your health care provider about preconception counseling.  If you may become pregnant, take 400 to 800 micrograms (mcg) of folic acid every day.  If you want to prevent pregnancy, talk to your health care provider about birth control (contraception). Osteoporosis and menopause  Osteoporosis is a disease in which the bones lose minerals and strength with aging. This can result in serious bone fractures. Your risk for osteoporosis  can be identified using a bone density scan.  If you are 80 years of age or older, or if you are at risk for osteoporosis and fractures, ask your health care provider if you should be screened.  Ask your health care provider whether you should take a calcium or vitamin D supplement to lower your risk for osteoporosis.  Menopause may have certain physical symptoms and risks.  Hormone replacement therapy may reduce some of these symptoms and risks. Talk to your health care provider about whether hormone replacement therapy is right for you. Follow these instructions at home:  Schedule regular health, dental, and eye exams.  Stay current with your immunizations.  Do not use any tobacco products including cigarettes, chewing tobacco, or electronic cigarettes.  If you are pregnant, do not drink alcohol.  If you are breastfeeding, limit how much and how often you drink alcohol.  Limit alcohol intake to no more than 1 drink per day for nonpregnant women. One drink equals 12 ounces of beer, 5 ounces of wine, or 1 ounces of hard liquor.  Do not use street drugs.  Do not share needles.  Ask your health care provider for help if you need support or information about quitting drugs.  Tell your health care provider if you often feel depressed.  Tell your health care provider if you have ever been abused or do not feel safe at home. This information is not intended to replace advice given to you by your health care provider. Make sure you discuss any questions you have with your health care provider. Document Released: 11/12/2010 Document Revised: 10/05/2015 Document Reviewed: 01/31/2015 Elsevier Interactive Patient Education  [employer].  It is important to avoid accidents which may result in broken bones.  Here are a few ideas on how to make your home safer so you will be less likely to trip or fall.  1. Use nonskid mats or non slip strips in your shower or tub, on your bathroom  floor and around sinks.  If you know that you have spilled water, wipe it up! 2. In the bathroom, it is important to have properly installed grab bars on the walls or on the edge of the tub.  Towel racks are NOT strong enough for you to hold onto or to pull on for support. 3. Stairs and hallways should have enough light.  Add lamps or night lights if you need ore light. 4. It is good to have handrails on both sides of the stairs if possible.  Always fix broken handrails right away. 5. It is important to see the edges of steps.  Paint the edges of outdoor steps white so you can see them better.  Put colored tape on the edge of inside steps. 6. Throw-rugs are dangerous because they can slide.  Removing the rugs is the best idea, but if they must stay, add adhesive carpet tape to prevent slipping. 7. Do not keep things on stairs or in the halls.  Remove small furniture that blocks the halls as it may cause you to trip.  Keep telephone and electrical cords out of the way where you walk. 8. Always were sturdy, rubber-soled shoes for good support.  Never wear just socks, especially on the stairs.  Socks may cause you to slip or fall.  Do not wear full-length housecoats as you can easily trip on the bottom.  9. Place the things you use the most on the shelves that are the easiest to reach.  If you use a stepstool, make sure it is in good condition.  If you feel unsteady, DO NOT climb, ask for help. 10. If a health professional advises you to use a cane or walker, do not be ashamed.  These items can keep you from falling and breaking your bones.

## 2018-01-08 ENCOUNTER — Other Ambulatory Visit (INDEPENDENT_AMBULATORY_CARE_PROVIDER_SITE_OTHER): Payer: Medicare Other

## 2018-01-08 ENCOUNTER — Ambulatory Visit (INDEPENDENT_AMBULATORY_CARE_PROVIDER_SITE_OTHER): Payer: Medicare Other | Admitting: Internal Medicine

## 2018-01-08 ENCOUNTER — Encounter: Payer: Self-pay | Admitting: Internal Medicine

## 2018-01-08 VITALS — BP 132/62 | HR 72 | Temp 97.8°F | Ht 62.0 in | Wt 126.5 lb

## 2018-01-08 DIAGNOSIS — I2510 Atherosclerotic heart disease of native coronary artery without angina pectoris: Secondary | ICD-10-CM

## 2018-01-08 DIAGNOSIS — M4802 Spinal stenosis, cervical region: Secondary | ICD-10-CM | POA: Diagnosis not present

## 2018-01-08 DIAGNOSIS — Z0000 Encounter for general adult medical examination without abnormal findings: Secondary | ICD-10-CM | POA: Diagnosis not present

## 2018-01-08 DIAGNOSIS — Z23 Encounter for immunization: Secondary | ICD-10-CM

## 2018-01-08 DIAGNOSIS — M150 Primary generalized (osteo)arthritis: Secondary | ICD-10-CM

## 2018-01-08 DIAGNOSIS — E785 Hyperlipidemia, unspecified: Secondary | ICD-10-CM | POA: Diagnosis not present

## 2018-01-08 DIAGNOSIS — M159 Polyosteoarthritis, unspecified: Secondary | ICD-10-CM

## 2018-01-08 LAB — LIPID PANEL
Cholesterol: 181 mg/dL (ref 0–200)
HDL: 107 mg/dL (ref >39.00)
LDL Cholesterol: 63 mg/dL (ref 0–99)
NonHDL: 74.1
Total CHOL/HDL Ratio: 2
Triglycerides: 58 mg/dL (ref 0.0–149.0)
VLDL: 11.6 mg/dL (ref 0.0–40.0)

## 2018-01-08 LAB — COMPREHENSIVE METABOLIC PANEL
ALT: 18 U/L (ref 0–35)
AST: 21 U/L (ref 0–37)
Albumin: 4.5 g/dL (ref 3.5–5.2)
Alkaline Phosphatase: 100 U/L (ref 39–117)
BUN: 17 mg/dL (ref 6–23)
CO2: 31 mEq/L (ref 19–32)
Calcium: 9.6 mg/dL (ref 8.4–10.5)
Chloride: 103 mEq/L (ref 96–112)
Creatinine, Ser: 0.88 mg/dL (ref 0.40–1.20)
GFR: 65.94 mL/min (ref >60.00)
Glucose, Bld: 105 mg/dL — ABNORMAL HIGH (ref 70–99)
Potassium: 4.1 mEq/L (ref 3.5–5.1)
Sodium: 141 mEq/L (ref 135–145)
Total Bilirubin: 1 mg/dL (ref 0.2–1.2)
Total Protein: 7.3 g/dL (ref 6.0–8.3)

## 2018-01-08 LAB — TSH: TSH: 1.6 u[IU]/mL (ref 0.35–4.50)

## 2018-01-08 MED ORDER — ASPIRIN EC 81 MG PO TBEC: 81.0000 mg | DELAYED_RELEASE_TABLET | Freq: Every day | ORAL | 1 refills | Status: DC

## 2018-01-08 MED ORDER — TRAMADOL HCL 50 MG PO TABS: 50.0000 mg | ORAL_TABLET | Freq: Two times a day (BID) | ORAL | 5 refills | Status: DC | PRN

## 2018-01-08 NOTE — Progress Notes (Signed)
Subjective:  Patient ID: Karla Roman, female    DOB: 14-Aug-1940  Age: 77 y.o. MRN: [id number]  CC: Hyperlipidemia and Annual Exam   HPI Karla Roman presents for a CPX.  She fell about 7 weeks ago and injured her right shoulder and left knee.  She has been seeing Ortho and was told that she had a right rotator cuff injury.  She has been doing exercises at home and has noticed some improvement.  There is no pain or swelling in the left knee but the right shoulder continues to bother her with pain and decreased range of motion.  She has some pain that keeps her awake at night.  She has tried to control the pain with Tylenol without much relief.  She cannot take NSAIDs.  Outpatient Medications Prior to Visit  Medication Sig Dispense Refill  . acetaminophen (TYLENOL) 500 MG tablet Take 500-1,000 mg by mouth every 6 (six) hours as needed for mild pain or headache.    Marland Kitchen atorvastatin (LIPITOR) 20 MG tablet TAKE 1 TABLET BY MOUTH EVERY DAY 90 tablet 1  . Calcium Carbonate (CALTRATE 600) 1500 MG TABS Take 1 tablet by mouth 2 (two) times daily. chewables    . Cholecalciferol (VITAMIN D-3 PO) Take 400 Units by mouth daily.    . cyanocobalamin 500 MCG tablet Take 500 mcg by mouth every morning.    Marland Kitchen omega-3 acid ethyl esters (LOVAZA) 1 G capsule Take 1 g by mouth every morning.    . pantoprazole (PROTONIX) 40 MG tablet TAKE 1 TABLET BY MOUTH  DAILY BEFORE BREAKFAST 90 tablet 1  . saccharomyces boulardii (FLORASTOR) 250 MG capsule Take 1 capsule (250 mg total) by mouth 2 (two) times daily. 60 capsule 0  . loperamide (IMODIUM) 2 MG capsule Take 1 capsule (2 mg total) by mouth 2 (two) times daily as needed for diarrhea or loose stools. 60 capsule 11  . aspirin EC 81 MG tablet Take 1 tablet (81 mg total) by mouth daily. 90 tablet 3  . Biotin 300 MCG TABS Take 1 tablet by mouth daily.      No facility-administered medications prior to visit.     ROS Review of Systems  Constitutional:  Negative for chills, diaphoresis, fatigue and fever.  HENT: Negative.   Respiratory: Negative.  Negative for cough, chest tightness, shortness of breath and wheezing.   Cardiovascular: Negative for chest pain, palpitations and leg swelling.  Gastrointestinal: Negative for abdominal pain, constipation, diarrhea, nausea and vomiting.  Genitourinary: Negative.  Negative for difficulty urinating.  Musculoskeletal: Positive for arthralgias and neck pain.  Skin: Negative.   Neurological: Negative.  Negative for dizziness, weakness and numbness.  Hematological: Negative for adenopathy. Does not bruise/bleed easily.  Psychiatric/Behavioral: Negative.     Objective:  BP 132/62 (BP Location: Left Arm, Patient Position: Sitting, Cuff Size: Normal)   Pulse 72   Temp 97.8 F (36.6 C) (Oral)   Ht 5\' 2"  (1.575 m)   Wt 126 lb 8 oz (57.4 kg)   SpO2 96%   BMI 23.14 kg/m   BP Readings from Last 3 Encounters:  01/08/18 132/62  01/07/18 112/68  11/17/17 (!) 141/64    Wt Readings from Last 3 Encounters:  01/08/18 126 lb 8 oz (57.4 kg)  01/07/18 126 lb (57.2 kg)  11/17/17 127 lb (57.6 kg)    Physical Exam  Constitutional: She is oriented to person, place, and time. No distress.  HENT:  Mouth/Throat: Oropharynx is clear and moist. No  oropharyngeal exudate.  Eyes: Conjunctivae are normal. No scleral icterus.  Neck: Normal range of motion. Neck supple. No JVD present. No thyromegaly present.  Cardiovascular: Normal rate, regular rhythm and normal heart sounds. Exam reveals no gallop.  No murmur heard. Pulmonary/Chest: Effort normal and breath sounds normal. She has no wheezes. She has no rales.  Abdominal: Soft. Bowel sounds are normal. She exhibits no mass. There is no hepatosplenomegaly. There is no tenderness.  Genitourinary:  Genitourinary Comments: Breast, GU, rectal exams were deferred at her request.  Musculoskeletal: Normal range of motion. She exhibits no edema, tenderness or  deformity.       Right shoulder: She exhibits no tenderness, no bony tenderness, no swelling, no crepitus and no deformity.       Left knee: She exhibits normal range of motion, no swelling, no effusion, no erythema and no bony tenderness. Deformity: DJD. No tenderness found.  Lymphadenopathy:    She has no cervical adenopathy.  Neurological: She is alert and oriented to person, place, and time.  Skin: Skin is warm and dry. She is not diaphoretic. No pallor.  Vitals reviewed.   Lab Results  Component Value Date   WBC 7.9 04/30/2017   HGB 13.0 04/30/2017   HCT 39.7 04/30/2017   PLT 212.0 04/30/2017   GLUCOSE 105 (H) 01/08/2018   CHOL 181 01/08/2018   TRIG 58.0 01/08/2018   HDL 107.00 01/08/2018   LDLDIRECT 115.1 12/23/2012   LDLCALC 63 01/08/2018   ALT 18 01/08/2018   AST 21 01/08/2018   NA 141 01/08/2018   K 4.1 01/08/2018   CL 103 01/08/2018   CREATININE 0.88 01/08/2018   BUN 17 01/08/2018   CO2 31 01/08/2018   TSH 1.60 01/08/2018    Dg Shoulder Right  Result Date: 11/17/2017 CLINICAL DATA:  The patient fell after missing a step and has right shoulder pain centered over the proximal humerus. History of shoulder fracture 2 years ago. EXAM: RIGHT SHOULDER - 2+ VIEW COMPARISON:  Limited views of the right shoulder from a chest x-ray of August 26, 2016 FINDINGS: The bones are subjectively adequately mineralized. No definite acute humeral head fracture is observed. The humeral neck appears intact. The glenohumeral joint space is well maintained. The subacromial subdeltoid space appears normal. The acromion and AC joint exhibit no acute abnormalities. The observed portions of the right clavicle are normal. The upper right ribs also appear normal. IMPRESSION: No acute bony abnormality of the right shoulder is observed. Electronically Signed   By: David  Martinique M.D.   On: 11/17/2017 13:17   Dg Knee Complete 4 Views Left  Result Date: 11/17/2017 CLINICAL DATA:  The patient fell after  missing a step. Patient has pain and bruising anteriorly and in the peripatellar region. EXAM: LEFT KNEE - COMPLETE 4+ VIEW COMPARISON:  None in PACs FINDINGS: The bones are subjectively adequately mineralized. There is a very large amount of pre patellar soft tissue swelling is stenting superiorly and inferiorly. There may be a small joint effusion. The joint spaces are well maintained. There is no acute fracture nor dislocation. IMPRESSION: Large amount of soft tissue swelling anteriorly. Possible small suprapatellar effusion. No acute fracture or dislocation. Electronically Signed   By: David  Martinique M.D.   On: 11/17/2017 13:37    Assessment & Plan:   Karla Roman was seen today for hyperlipidemia and annual exam.  Diagnoses and all orders for this visit:  Need for Tdap vaccination -     Tdap vaccine greater than or  equal to 7yo IM  Spinal stenosis of cervical region -     Discontinue: traMADol (ULTRAM) 50 MG tablet; Take 1 tablet (50 mg total) by mouth every 12 (twelve) hours as needed. -     traMADol (ULTRAM) 50 MG tablet; Take 1 tablet (50 mg total) by mouth every 12 (twelve) hours as needed.  Primary osteoarthritis involving multiple joints- She has pain that keeps her awake at night so will offer tramadol as needed.  She also agrees to follow-up with orthopedics about her right shoulder. -     Discontinue: traMADol (ULTRAM) 50 MG tablet; Take 1 tablet (50 mg total) by mouth every 12 (twelve) hours as needed. -     traMADol (ULTRAM) 50 MG tablet; Take 1 tablet (50 mg total) by mouth every 12 (twelve) hours as needed.  Coronary artery disease involving native coronary artery of native heart without angina pectoris- She has had no recent episodes of angina.  Will continue risk factor modification with statin therapy and a baby aspirin every day. -     Lipid panel; Future -     aspirin EC 81 MG tablet; Take 1 tablet (81 mg total) by mouth daily.  Hyperlipidemia with target LDL less than 130-  She has achieved her LDL goal and is doing well on the statin. -     Lipid panel; Future -     Comprehensive metabolic panel; Future -     TSH; Future  Routine general medical examination at a health care facility   I have discontinued Karla Roman. Karla Roman's Biotin, loperamide, and aspirin EC. I am also having her start on aspirin EC. Additionally, I am having her maintain her calcium carbonate, acetaminophen, vitamin B-12, omega-3 acid ethyl esters, saccharomyces boulardii, Cholecalciferol (VITAMIN D-3 PO), pantoprazole, atorvastatin, and traMADol.  Meds ordered this encounter  Medications  . DISCONTD: traMADol (ULTRAM) 50 MG tablet    Sig: Take 1 tablet (50 mg total) by mouth every 12 (twelve) hours as needed.    Dispense:  60 tablet    Refill:  5  . traMADol (ULTRAM) 50 MG tablet    Sig: Take 1 tablet (50 mg total) by mouth every 12 (twelve) hours as needed.    Dispense:  60 tablet    Refill:  5  . aspirin EC 81 MG tablet    Sig: Take 1 tablet (81 mg total) by mouth daily.    Dispense:  90 tablet    Refill:  1     Follow-up: Return in about 6 months (around 07/11/2018).  Scarlette Calico, MD

## 2018-01-08 NOTE — Patient Instructions (Signed)

## 2018-01-19 ENCOUNTER — Other Ambulatory Visit: Payer: Self-pay | Admitting: Internal Medicine

## 2018-01-19 DIAGNOSIS — E785 Hyperlipidemia, unspecified: Secondary | ICD-10-CM

## 2018-01-19 DIAGNOSIS — I2510 Atherosclerotic heart disease of native coronary artery without angina pectoris: Secondary | ICD-10-CM

## 2018-01-26 ENCOUNTER — Encounter: Payer: Self-pay | Admitting: Internal Medicine

## 2018-01-26 DIAGNOSIS — M25512 Pain in left shoulder: Principal | ICD-10-CM

## 2018-01-26 DIAGNOSIS — G8929 Other chronic pain: Secondary | ICD-10-CM

## 2018-01-26 DIAGNOSIS — M25511 Pain in right shoulder: Principal | ICD-10-CM

## 2018-02-05 DIAGNOSIS — M25511 Pain in right shoulder: Secondary | ICD-10-CM | POA: Diagnosis not present

## 2018-02-10 ENCOUNTER — Ambulatory Visit (INDEPENDENT_AMBULATORY_CARE_PROVIDER_SITE_OTHER): Payer: Medicare Other

## 2018-02-10 DIAGNOSIS — Z23 Encounter for immunization: Secondary | ICD-10-CM

## 2018-02-11 DIAGNOSIS — M25511 Pain in right shoulder: Secondary | ICD-10-CM | POA: Diagnosis not present

## 2018-02-17 DIAGNOSIS — M25511 Pain in right shoulder: Secondary | ICD-10-CM | POA: Diagnosis not present

## 2018-02-25 DIAGNOSIS — R2689 Other abnormalities of gait and mobility: Secondary | ICD-10-CM | POA: Diagnosis not present

## 2018-02-25 DIAGNOSIS — M25511 Pain in right shoulder: Secondary | ICD-10-CM | POA: Diagnosis not present

## 2018-02-25 DIAGNOSIS — M25619 Stiffness of unspecified shoulder, not elsewhere classified: Secondary | ICD-10-CM | POA: Diagnosis not present

## 2018-02-27 DIAGNOSIS — R600 Localized edema: Secondary | ICD-10-CM | POA: Diagnosis not present

## 2018-02-27 DIAGNOSIS — M25511 Pain in right shoulder: Secondary | ICD-10-CM | POA: Diagnosis not present

## 2018-02-27 DIAGNOSIS — R2689 Other abnormalities of gait and mobility: Secondary | ICD-10-CM | POA: Diagnosis not present

## 2018-02-27 DIAGNOSIS — M25619 Stiffness of unspecified shoulder, not elsewhere classified: Secondary | ICD-10-CM | POA: Diagnosis not present

## 2018-03-04 DIAGNOSIS — R2689 Other abnormalities of gait and mobility: Secondary | ICD-10-CM | POA: Diagnosis not present

## 2018-03-04 DIAGNOSIS — M25511 Pain in right shoulder: Secondary | ICD-10-CM | POA: Diagnosis not present

## 2018-03-04 DIAGNOSIS — M25619 Stiffness of unspecified shoulder, not elsewhere classified: Secondary | ICD-10-CM | POA: Diagnosis not present

## 2018-03-04 DIAGNOSIS — R600 Localized edema: Secondary | ICD-10-CM | POA: Diagnosis not present

## 2018-03-11 DIAGNOSIS — M25511 Pain in right shoulder: Secondary | ICD-10-CM | POA: Diagnosis not present

## 2018-03-11 DIAGNOSIS — M25619 Stiffness of unspecified shoulder, not elsewhere classified: Secondary | ICD-10-CM | POA: Diagnosis not present

## 2018-03-11 DIAGNOSIS — R2689 Other abnormalities of gait and mobility: Secondary | ICD-10-CM | POA: Diagnosis not present

## 2018-03-11 DIAGNOSIS — R600 Localized edema: Secondary | ICD-10-CM | POA: Diagnosis not present

## 2018-03-16 DIAGNOSIS — R2689 Other abnormalities of gait and mobility: Secondary | ICD-10-CM | POA: Diagnosis not present

## 2018-03-16 DIAGNOSIS — M25619 Stiffness of unspecified shoulder, not elsewhere classified: Secondary | ICD-10-CM | POA: Diagnosis not present

## 2018-03-16 DIAGNOSIS — M25511 Pain in right shoulder: Secondary | ICD-10-CM | POA: Diagnosis not present

## 2018-03-17 DIAGNOSIS — M25511 Pain in right shoulder: Secondary | ICD-10-CM | POA: Diagnosis not present

## 2018-03-19 ENCOUNTER — Ambulatory Visit (INDEPENDENT_AMBULATORY_CARE_PROVIDER_SITE_OTHER): Payer: Medicare Other | Admitting: Internal Medicine

## 2018-03-19 ENCOUNTER — Ambulatory Visit (INDEPENDENT_AMBULATORY_CARE_PROVIDER_SITE_OTHER)
Admission: RE | Admit: 2018-03-19 | Discharge: 2018-03-19 | Disposition: A | Discharge status: Home / Self Care | Payer: Medicare Other | Source: Ambulatory Visit | Attending: Internal Medicine | Admitting: Internal Medicine

## 2018-03-19 ENCOUNTER — Encounter: Payer: Self-pay | Admitting: Internal Medicine

## 2018-03-19 VITALS — BP 128/68 | HR 80 | Temp 98.3°F | Ht 62.0 in | Wt 130.0 lb

## 2018-03-19 DIAGNOSIS — M549 Dorsalgia, unspecified: Secondary | ICD-10-CM | POA: Insufficient documentation

## 2018-03-19 DIAGNOSIS — J4520 Mild intermittent asthma, uncomplicated: Secondary | ICD-10-CM

## 2018-03-19 DIAGNOSIS — M546 Pain in thoracic spine: Secondary | ICD-10-CM

## 2018-03-19 DIAGNOSIS — R079 Chest pain, unspecified: Secondary | ICD-10-CM | POA: Diagnosis not present

## 2018-03-19 NOTE — Assessment & Plan Note (Signed)
Will check x-ray today. Lungs sound clear and no steroids or antibiotics are indicated. Advised to start taking zyrtec.

## 2018-03-19 NOTE — Assessment & Plan Note (Signed)
Appears to be muscular in nature and sore to touch. No radiation or worrisome features and is well controlled with tylenol. Advised patient to use tylenol up to max dose 3000 mg daily for pain and use heating pad as well.

## 2018-03-19 NOTE — Progress Notes (Signed)
   Subjective:    Patient ID: Karla Roman, female    DOB: 1940-06-20, 77 y.o.   MRN: [id number]  HPI The patient is a 77 YO female coming in for left shoulder blade pain. Started about 1 week ago. She denies injury or overuse at the time but admits to fall about 1 month ago (later in visit changes to 2 months ago) where she injured the right shoulder and is almost done with treatment and this is recovered now). She has used heating pad which was helpful, tylenol which was helpful which she is taking in the evening only. The pain is rated 8/10 although she describes as mild throbbing all the time and she rated highly due to constant nature of what sounds to be 2-3/10 pain. She denies that coughing makes worse. She started coughing about 1 day ago and has asthma which she normally does not take medicine for but she is worried as she has had several bouts of pneumonia in the past. Denies fevers or chills. Denies that the shoulder pain worsens with movement and no limitation of ROM.   Review of Systems  Constitutional: Positive for activity change. Negative for appetite change, chills, fatigue, fever and unexpected weight change.  HENT: Positive for congestion and postnasal drip.   Respiratory: Positive for cough. Negative for chest tightness, shortness of breath, wheezing and stridor.   Cardiovascular: Negative.   Gastrointestinal: Negative.   Musculoskeletal: Positive for arthralgias, back pain and myalgias. Negative for gait problem and joint swelling.  Skin: Negative.   Neurological: Negative.       Objective:   Physical Exam  Constitutional: She is oriented to person, place, and time. She appears well-developed and well-nourished.  HENT:  Head: Normocephalic and atraumatic.  Eyes: EOM are normal.  Neck: Normal range of motion.  Cardiovascular: Normal rate and regular rhythm.  Pulmonary/Chest: Effort normal and breath sounds normal. No respiratory distress. She has no wheezes. She has  no rales.  Abdominal: Soft. Bowel sounds are normal. She exhibits no distension. There is no tenderness. There is no rebound.  Musculoskeletal: She exhibits tenderness. She exhibits no edema.  Left scapular region tender to touch, AC joint non-tender and not swollen. Thoracic spine not tender to palpation. Full ROM left shoulder and arm. Neck full ROM and non-tender  Neurological: She is alert and oriented to person, place, and time. Coordination normal.  Skin: Skin is warm and dry.  Psychiatric: She has a normal mood and affect.   Vitals:   03/19/18 0951  BP: 128/68  Pulse: 80  Temp: 98.3 F (36.8 C)  TempSrc: Oral  SpO2: 97%  Weight: 130 lb (59 kg)  Height: 5\' 2"  (1.575 m)      Assessment & Plan:

## 2018-03-19 NOTE — Patient Instructions (Signed)
We are checking an x-ray today of the chest and will call you about the results.   It is okay to take tylenol for the pain 2 pills (1000 mg) 3 times per day. Do not exceed 3000 mg of tylenol in a day.

## 2018-03-23 ENCOUNTER — Telehealth: Payer: Self-pay | Admitting: Internal Medicine

## 2018-03-23 NOTE — Telephone Encounter (Signed)
Rec'd from [facility] forwarded 3 pages to Dr. Jones Zachow °

## 2018-03-25 DIAGNOSIS — R2689 Other abnormalities of gait and mobility: Secondary | ICD-10-CM | POA: Diagnosis not present

## 2018-03-25 DIAGNOSIS — M25619 Stiffness of unspecified shoulder, not elsewhere classified: Secondary | ICD-10-CM | POA: Diagnosis not present

## 2018-03-25 DIAGNOSIS — M25511 Pain in right shoulder: Secondary | ICD-10-CM | POA: Diagnosis not present

## 2018-04-14 ENCOUNTER — Other Ambulatory Visit: Payer: Medicare Other

## 2018-04-14 ENCOUNTER — Encounter: Payer: Self-pay | Admitting: Internal Medicine

## 2018-04-14 ENCOUNTER — Ambulatory Visit (INDEPENDENT_AMBULATORY_CARE_PROVIDER_SITE_OTHER): Payer: Medicare Other | Admitting: Internal Medicine

## 2018-04-14 VITALS — BP 150/82 | HR 81 | Temp 97.8°F | Resp 16 | Ht 62.0 in | Wt 128.0 lb

## 2018-04-14 DIAGNOSIS — R300 Dysuria: Secondary | ICD-10-CM | POA: Diagnosis not present

## 2018-04-14 DIAGNOSIS — N3000 Acute cystitis without hematuria: Secondary | ICD-10-CM | POA: Diagnosis not present

## 2018-04-14 LAB — POCT URINALYSIS DIPSTICK
Bilirubin, UA: NEGATIVE
Glucose, UA: NEGATIVE
Ketones, UA: NEGATIVE
Nitrite, UA: POSITIVE
Protein, UA: POSITIVE — AB
Spec Grav, UA: >=1.03 — AB (ref 1.010–1.025)
Urobilinogen, UA: NEGATIVE E.U./dL — AB
pH, UA: 6 (ref 5.0–8.0)

## 2018-04-14 MED ORDER — PHENAZOPYRIDINE HCL 200 MG PO TABS: 200.0000 mg | ORAL_TABLET | Freq: Three times a day (TID) | ORAL | 0 refills | Status: DC | PRN

## 2018-04-14 MED ORDER — CEPHALEXIN 500 MG PO CAPS: 500.0000 mg | ORAL_CAPSULE | Freq: Two times a day (BID) | ORAL | 0 refills | Status: DC

## 2018-04-14 NOTE — Patient Instructions (Addendum)
Take the antibiotic as prescribed.  Take tylenol if needed.    Take the pyridium for the burning as needed - if this is not covered you take over the counter AZO.  Increase your water intake.   Call if no improvement     Urinary Tract Infection, Adult A urinary tract infection (UTI) is an infection of any part of the urinary tract, which includes the kidneys, ureters, bladder, and urethra. These organs make, store, and get rid of urine in the body. UTI can be a bladder infection (cystitis) or kidney infection (pyelonephritis). What are the causes? This infection may be caused by fungi, viruses, or bacteria. Bacteria are the most common cause of UTIs. This condition can also be caused by repeated incomplete emptying of the bladder during urination. What increases the risk? This condition is more likely to develop if:  You ignore your need to urinate or hold urine for long periods of time.  You do not empty your bladder completely during urination.  You wipe back to front after urinating or having a bowel movement, if you are female.  You are uncircumcised, if you are female.  You are constipated.  You have a urinary catheter that stays in place (indwelling).  You have a weak defense (immune) system.  You have a medical condition that affects your bowels, kidneys, or bladder.  You have diabetes.  You take antibiotic medicines frequently or for long periods of time, and the antibiotics no longer work well against certain types of infections (antibiotic resistance).  You take medicines that irritate your urinary tract.  You are exposed to chemicals that irritate your urinary tract.  You are female.  What are the signs or symptoms? Symptoms of this condition include:  Fever.  Frequent urination or passing small amounts of urine frequently.  Needing to urinate urgently.  Pain or burning with urination.  Urine that smells bad or unusual.  Cloudy urine.  Pain in the  lower abdomen or back.  Trouble urinating.  Blood in the urine.  Vomiting or being less hungry than normal.  Diarrhea or abdominal pain.  Vaginal discharge, if you are female.  How is this diagnosed? This condition is diagnosed with a medical history and physical exam. You will also need to provide a urine sample to test your urine. Other tests may be done, including:  Blood tests.  Sexually transmitted disease (STD) testing.  If you have had more than one UTI, a cystoscopy or imaging studies may be done to determine the cause of the infections. How is this treated? Treatment for this condition often includes a combination of two or more of the following:  Antibiotic medicine.  Other medicines to treat less common causes of UTI.  Over-the-counter medicines to treat pain.  Drinking enough water to stay hydrated.  Follow these instructions at home:  Take over-the-counter and prescription medicines only as told by your health care provider.  If you were prescribed an antibiotic, take it as told by your health care provider. Do not stop taking the antibiotic even if you start to feel better.  Avoid alcohol, caffeine, tea, and carbonated beverages. They can irritate your bladder.  Drink enough fluid to keep your urine clear or pale yellow.  Keep all follow-up visits as told by your health care provider. This is important.  Make sure to: ? Empty your bladder often and completely. Do not hold urine for long periods of time. ? Empty your bladder before and after sex. ?  Wipe from front to back after a bowel movement if you are female. Use each tissue one time when you wipe. Contact a health care provider if:  You have back pain.  You have a fever.  You feel nauseous or vomit.  Your symptoms do not get better after 3 days.  Your symptoms go away and then return. Get help right away if:  You have severe back pain or lower abdominal pain.  You are vomiting and cannot  keep down any medicines or water. This information is not intended to replace advice given to you by your health care provider. Make sure you discuss any questions you have with your health care provider. Document Released: 02/06/2005 Document Revised: 10/11/2015 Document Reviewed: 03/20/2015 Elsevier Interactive Patient Education  [employer].

## 2018-04-14 NOTE — Progress Notes (Signed)
Subjective:    Patient ID: Karla Roman, female    DOB: 1940-10-13, 77 y.o.   MRN: [id number]  HPI The patient is here for an acute visit.   ? UTI: her symtpoms stared last night.  She has dysuria - with and without urination, urinary frequency, darker urine.  She denies any fevers, chills, abdominal pain, nausea, hematuria, urine odor, new mid back pain, headaches and lightheadedness  Her last urinary tract infection was several years ago.  Medications and allergies reviewed with patient and updated if appropriate.  Patient Active Problem List   Diagnosis Date Noted  . Left-sided back pain 03/19/2018  . Primary osteoarthritis involving multiple joints 01/08/2018  . CAD (coronary artery disease) 09/10/2016  . 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 Asthma, mild intermittent 09/05/2015  . IBS (irritable bowel syndrome) 08/03/2015  . Spinal stenosis of cervical region 10/11/2014  . Hyperlipidemia with target LDL less than 130 12/27/2013  . Duodenal ulcer, with obstruction 06/28/2013  . Renal cyst, right 06/16/2013  . Routine general medical examination at a health care facility 12/15/2010  . MITRAL VALVE PROLAPSE 12/29/2006  . Allergic rhinitis 12/29/2006  . GERD 12/29/2006    Current Outpatient Medications on File Prior to Visit  Medication Sig Dispense Refill  . acetaminophen (TYLENOL) 500 MG tablet Take 500-1,000 mg by mouth every 6 (six) hours as needed for mild pain or headache.    Marland Kitchen aspirin EC 81 MG tablet Take 1 tablet (81 mg total) by mouth daily. 90 tablet 1  . atorvastatin (LIPITOR) 20 MG tablet TAKE 1 TABLET BY MOUTH  DAILY 90 tablet 1  . Calcium Carbonate (CALTRATE 600) 1500 MG TABS Take 1 tablet by mouth 2 (two) times daily. chewables    . Cholecalciferol (VITAMIN D-3 PO) Take 400 Units by mouth daily.    . cyanocobalamin 500 MCG tablet Take 500 mcg by mouth every morning.    Marland Kitchen omega-3 acid ethyl esters (LOVAZA) 1 G capsule Take 1 g by mouth every morning.     . pantoprazole (PROTONIX) 40 MG tablet TAKE 1 TABLET BY MOUTH  DAILY BEFORE BREAKFAST 90 tablet 1  . saccharomyces boulardii (FLORASTOR) 250 MG capsule Take 1 capsule (250 mg total) by mouth 2 (two) times daily. 60 capsule 0  . traMADol (ULTRAM) 50 MG tablet Take 1 tablet (50 mg total) by mouth every 12 (twelve) hours as needed. 60 tablet 5   No current facility-administered medications on file prior to visit.     Past Medical History:  Diagnosis Date  . Allergic rhinitis   . Allergy   . Anemia    in childhood  . Arthritis   . Asthma   . Atrophic vaginitis   . Cataract   . Diverticulitis   . Diverticulosis   . Duodenal ulcer   . Dyspareunia   . Dysplastic nevus 09/2012, 10/2012   right thigh, severe atypia, resected 10/2012 by Dr Jarome Matin  . Esophagitis    grade 1  . Eustachian tube dysfunction   . GERD (gastroesophageal reflux disease)   . Hiatal hernia   . Hx of adenomatous colonic polyps 02/2009   Tubular adenoma in sigmoid. Diminutive.   . Hyperlipemia   . MVP (mitral valve prolapse)   . Osteopenia   . Schatzki's ring 1995  . Sleep apnea 10/2004   Sleep study by Dr Gwenette Greet. does not need cpap  . Vertigo     Past Surgical History:  Procedure Laterality Date  . ABDOMINAL  HYSTERECTOMY  2001   with anterior and posterior vaginal repair  . APPENDECTOMY  1989   ruptured  . BREAST BIOPSY Right 1969   benign  . CERVICAL DISCECTOMY  01/2004   C3-4, C5-6, C6-7  . depuytren's contraction Right 04/2006  . dysplastic nevus  10/2012   right thigh  . ESOPHAGOGASTRODUODENOSCOPY N/A 06/04/2013   Procedure: ESOPHAGOGASTRODUODENOSCOPY (EGD);  Surgeon: Lafayette Dragon, MD;  [facility]: [facility] ENDOSCOPY;  Service: Endoscopy;  Laterality: N/A;  . LAPAROSCOPY  04/2000   with lysis of adhesions  . right shoulder fracture Right 03/2013   non-displaced, humeral head, non-operative mgt but she developed frozen shoulder.   . SEPTOPLASTY  7782  . SHOULDER SURGERY Left 01/2004   release of  adhesive capsulitis  . TONSILLECTOMY  1947    Social History   Socioeconomic History  . Marital status: Married    Spouse name: Eliannah Hinde  . Number of children: 2  . Years of education: 40  . Highest education level: Not on file  Occupational History  . Occupation: Retired from [employer]  . Financial resource strain: Not hard at all  . Food insecurity:    Worry: Never true    Inability: Never true  . Transportation needs:    Medical: No    Non-medical: No  Tobacco Use  . Smoking status: Never Smoker  . Smokeless tobacco: Never Used  Substance and Sexual Activity  . Alcohol use: No    Alcohol/week: 0.0 standard drinks  . Drug use: No  . Sexual activity: Not Currently  Lifestyle  . Physical activity:    Days per week: 0 days    Minutes per session: 0 min  . Stress: Not at all  Relationships  . Social connections:    Talks on phone: More than three times a week    Gets together: More than three times a week    Attends religious service: Never    Active member of club or organization: Yes    Attends meetings of clubs or organizations: More than 4 times per year    Relationship status: Married  Other Topics Concern  . Not on file  Social History Narrative   HSG. Married 1959. 1 son - ''66; 1 dtr - '80; 6 grandchildren (1 set of twins). Marriage is in good health - SO with rectal cancer. Work - retired.    Sister in SNF; ? Dementia;   Taking care of grand kids;    Lives in [place]; bedrooms on 1st floor    Family History  Problem Relation Age of Onset  . Heart attack Father        after years of angina  . Coronary artery disease Father   . Coronary artery disease Mother   . Breast cancer Maternal Aunt   . Cancer Maternal Aunt        breast, colon, ovarian  . Diabetes Brother   . Coronary artery disease Brother   . Hypertension Sister   . Hyperthyroidism Sister   . Irritable bowel syndrome Sister   . Coronary artery disease Brother         PCI-stents  . Diabetes Brother   . Hypertension Brother   . Colon cancer Neg Hx     Review of Systems  Constitutional: Negative for chills and fever.  Gastrointestinal: Negative for abdominal pain and nausea.  Genitourinary: Positive for dysuria and frequency. Negative for hematuria.       Urine is darker,  no odor  Musculoskeletal: Negative for back pain.  Neurological: Negative for dizziness, light-headedness and headaches.       Objective:   Vitals:   04/14/18 0928  BP: (!) 150/82  Pulse: 81  Resp: 16  Temp: 97.8 F (36.6 C)  SpO2: 98%   BP Readings from Last 3 Encounters:  04/14/18 (!) 150/82  03/19/18 128/68  01/08/18 132/62   Wt Readings from Last 3 Encounters:  04/14/18 128 lb (58.1 kg)  03/19/18 130 lb (59 kg)  01/08/18 126 lb 8 oz (57.4 kg)   Body mass index is 23.41 kg/m.   Physical Exam  Constitutional: She appears well-developed and well-nourished. No distress.  HENT:  Head: Normocephalic and atraumatic.  Abdominal: Soft. There is tenderness (Minimal-suprapubic region). There is no rebound and no guarding.  Genitourinary:  Genitourinary Comments: No CVA tenderness b/l  Skin: Skin is warm and dry. She is not diaphoretic.           Assessment & Plan:    See Problem List for Assessment and Plan of chronic medical problems.

## 2018-04-14 NOTE — Assessment & Plan Note (Signed)
Urine dip consistent with UTI Start Keflex twice daily x7 days Send urine for culture Increase fluids Tylenol as needed Prescription for Pyridium given-advised that if this is not covered she can take over-the-counter AZO Call if no improvement

## 2018-04-16 ENCOUNTER — Other Ambulatory Visit: Payer: Self-pay | Admitting: Internal Medicine

## 2018-04-16 ENCOUNTER — Ambulatory Visit: Payer: Self-pay

## 2018-04-16 ENCOUNTER — Encounter: Payer: Self-pay | Admitting: Internal Medicine

## 2018-04-16 LAB — URINE CULTURE
MICRO NUMBER:: 91445535
SPECIMEN QUALITY:: ADEQUATE

## 2018-04-16 NOTE — Telephone Encounter (Signed)
Pt called because she wanted refill for Imodium in a capsule; she is having loose stools which is intermittent; she declines nurse triage; she says that the prescription expired because she does not use it very often; her pharmacy is [facility]; she can be contacted at [phone] or [phone]; the pt is normally seen by Dr Scarlette Calico, [facility]; will route to office for final disposition.      Reason for Disposition . Caller requesting a NON-URGENT new prescription or refill and triager unable to refill per unit policy  Answer Assessment - Initial Assessment Questions 1. SYMPTOMS: "Do you have any symptoms?"     no 2. SEVERITY: If symptoms are present, ask "Are they mild, moderate or severe?"     n/a  Protocols used: MEDICATION QUESTION CALL-A-AH

## 2018-04-16 NOTE — Telephone Encounter (Signed)
Pt informed that imodium capsules are available over the counter.

## 2018-04-16 NOTE — Telephone Encounter (Signed)
UTI may not be successfully treated by her current antibiotic - have her stop it and start augmentin - pending

## 2018-04-17 DIAGNOSIS — H5203 Hypermetropia, bilateral: Secondary | ICD-10-CM | POA: Diagnosis not present

## 2018-04-17 DIAGNOSIS — H2513 Age-related nuclear cataract, bilateral: Secondary | ICD-10-CM | POA: Diagnosis not present

## 2018-04-17 DIAGNOSIS — H524 Presbyopia: Secondary | ICD-10-CM | POA: Diagnosis not present

## 2018-04-17 MED ORDER — AMOXICILLIN-POT CLAVULANATE 875-125 MG PO TABS: 1.0000 | ORAL_TABLET | Freq: Two times a day (BID) | ORAL | 0 refills | Status: DC

## 2018-04-17 NOTE — Telephone Encounter (Signed)
Called pt no answer left detail msg on vm. [facility].Sent CRM to [facility] for FYI if pt return call back...Karla Roman

## 2018-04-24 ENCOUNTER — Encounter: Payer: Self-pay | Admitting: Internal Medicine

## 2018-04-24 DIAGNOSIS — R350 Frequency of micturition: Secondary | ICD-10-CM

## 2018-04-27 ENCOUNTER — Other Ambulatory Visit (INDEPENDENT_AMBULATORY_CARE_PROVIDER_SITE_OTHER): Payer: Medicare Other

## 2018-04-27 DIAGNOSIS — R350 Frequency of micturition: Secondary | ICD-10-CM | POA: Diagnosis not present

## 2018-04-27 LAB — URINALYSIS, ROUTINE W REFLEX MICROSCOPIC
Bilirubin Urine: NEGATIVE
Hgb urine dipstick: NEGATIVE
Ketones, ur: NEGATIVE
Nitrite: NEGATIVE
Specific Gravity, Urine: 1.025 (ref 1.000–1.030)
Total Protein, Urine: NEGATIVE
Urine Glucose: NEGATIVE
Urobilinogen, UA: 0.2 (ref 0.0–1.0)
pH: 5.5 (ref 5.0–8.0)

## 2018-04-28 ENCOUNTER — Encounter: Payer: Self-pay | Admitting: Internal Medicine

## 2018-04-28 LAB — URINE CULTURE
MICRO NUMBER:: 91501874
SPECIMEN QUALITY:: ADEQUATE

## 2018-04-29 ENCOUNTER — Encounter: Payer: Self-pay | Admitting: Internal Medicine

## 2018-05-01 DIAGNOSIS — D2261 Melanocytic nevi of right upper limb, including shoulder: Secondary | ICD-10-CM | POA: Diagnosis not present

## 2018-05-01 DIAGNOSIS — L720 Epidermal cyst: Secondary | ICD-10-CM | POA: Diagnosis not present

## 2018-05-01 DIAGNOSIS — L812 Freckles: Secondary | ICD-10-CM | POA: Diagnosis not present

## 2018-05-01 DIAGNOSIS — L821 Other seborrheic keratosis: Secondary | ICD-10-CM | POA: Diagnosis not present

## 2018-05-01 DIAGNOSIS — Z85820 Personal history of malignant melanoma of skin: Secondary | ICD-10-CM | POA: Diagnosis not present

## 2018-05-24 IMAGING — CT CT ANGIO CHEST
2 of 6 series · 18 of 36 positions shown · IV contrast (isovue)
Comparison: Chest CT 04/03/2011, abdominal CT 06/03/2013, chest
x-ray 11/20/2015

CLINICAL DATA: 75-year-old female with a history of right-sided
chest pain radiating to the shoulder

EXAM:
CT CHEST WITHOUT AND WITH CONTRAST
TECHNIQUE: Multidetector CT imaging of the chest was performed following the
standard protocol before and during bolus administration of
intravenous contrast.
CONTRAST:  85 cc Isovue 370

[Series 6: arterial · axial · arterial · 0.61mm/px · z∈[-342,-68]mm · 17 of 155 slices shown]
[im 9/155  lung]
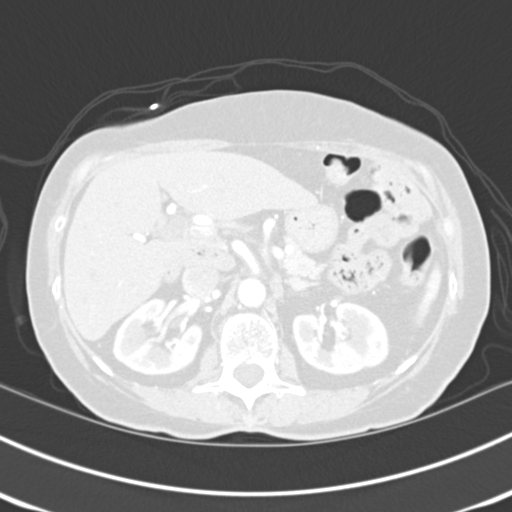
[im 17/155  mediastinal]
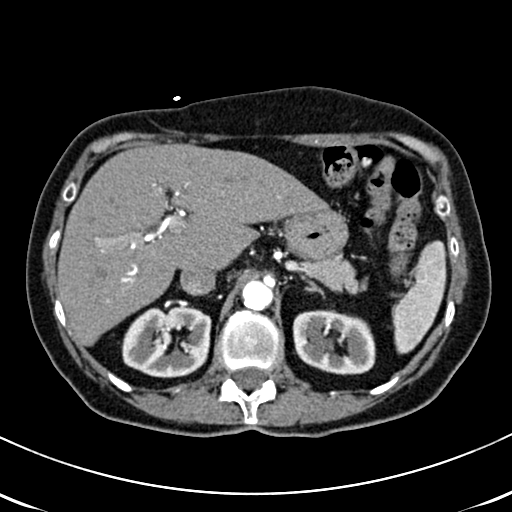
[im 25/155  lung]
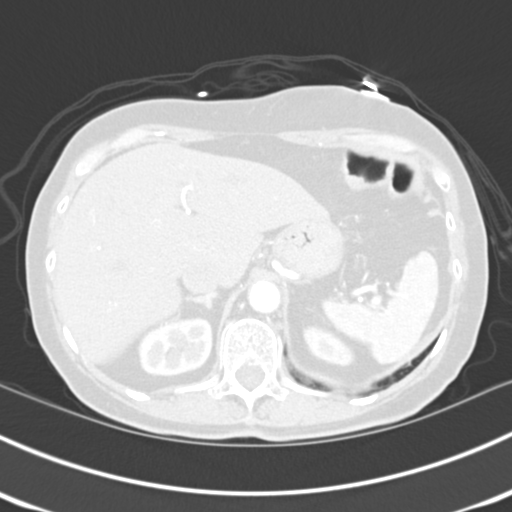
[im 33/155  mediastinal]
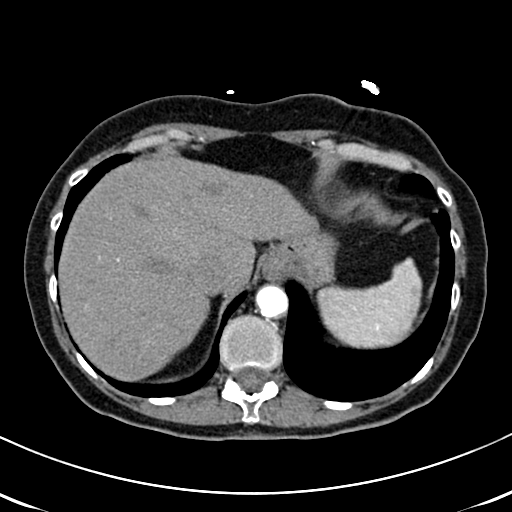
[im 41/155  lung]
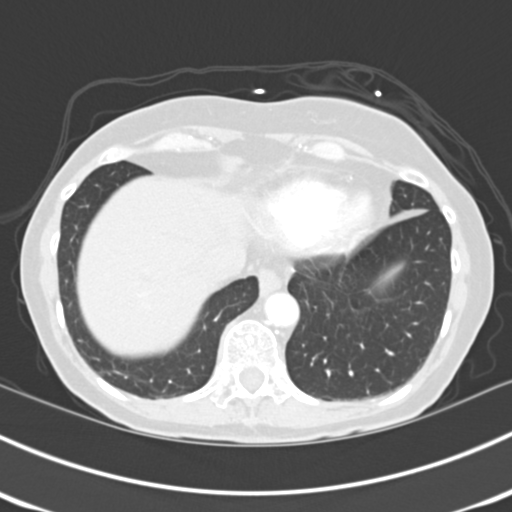
[im 49/155  mediastinal]
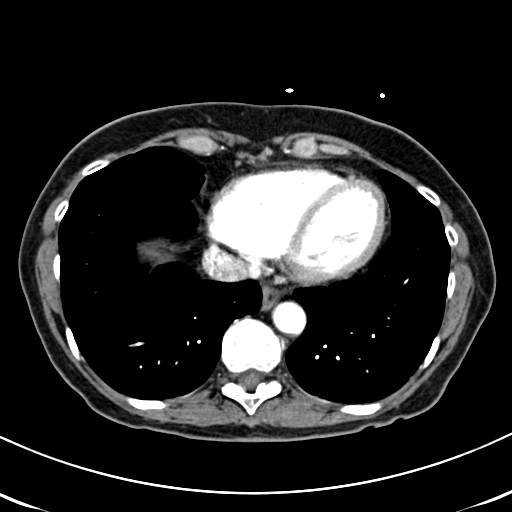
[im 57/155  lung]
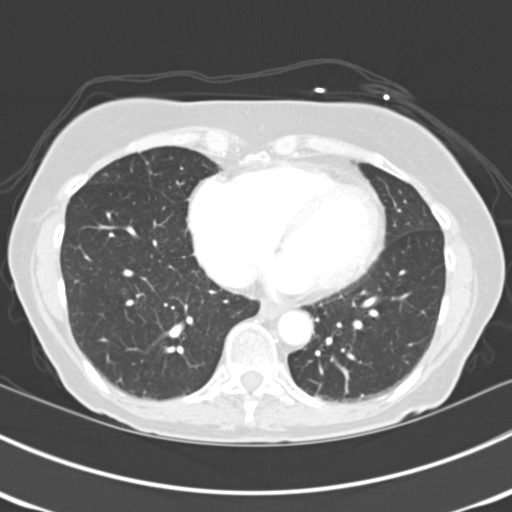
[im 65/155  mediastinal]
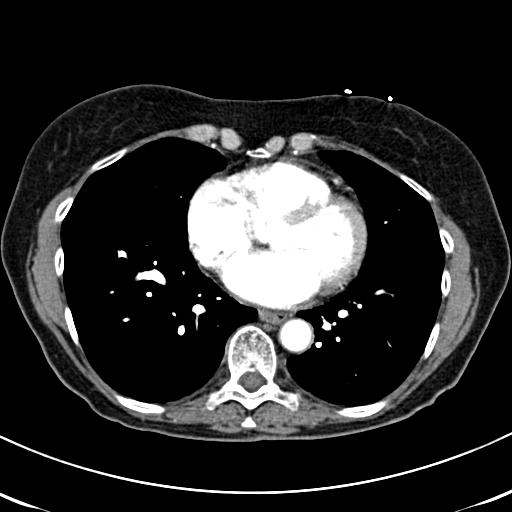
[im 82/155  lung]
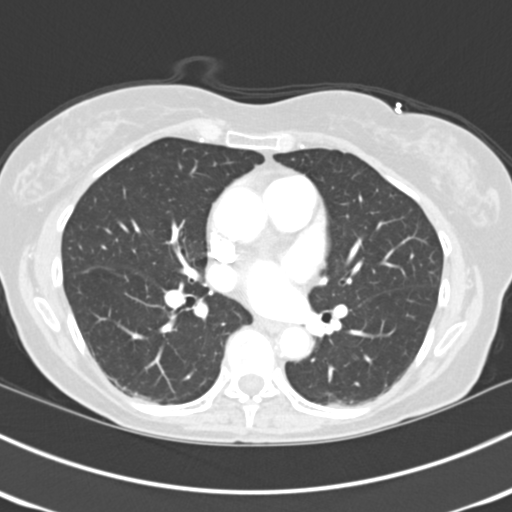
[im 90/155  mediastinal]
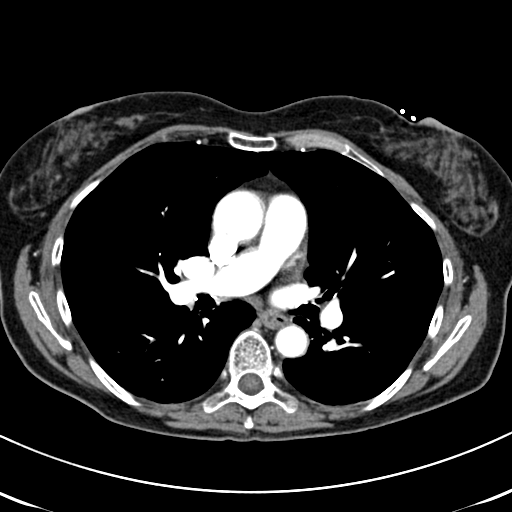
[im 98/155  lung]
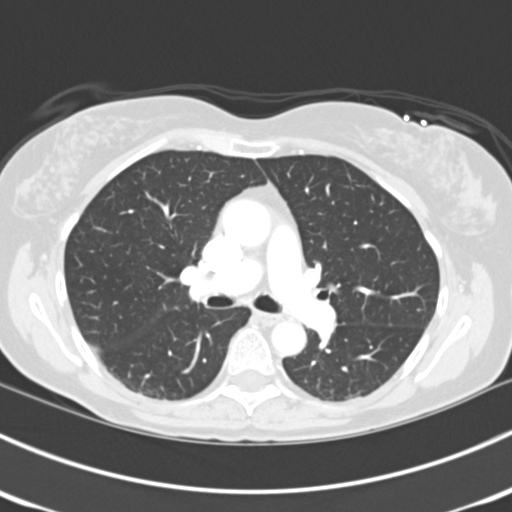
[im 106/155  mediastinal]
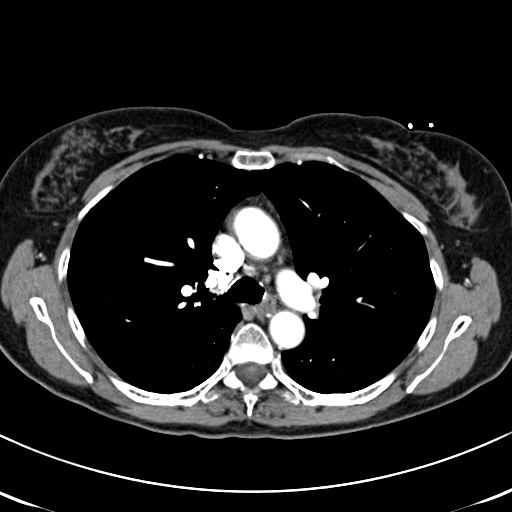
[im 114/155  lung]
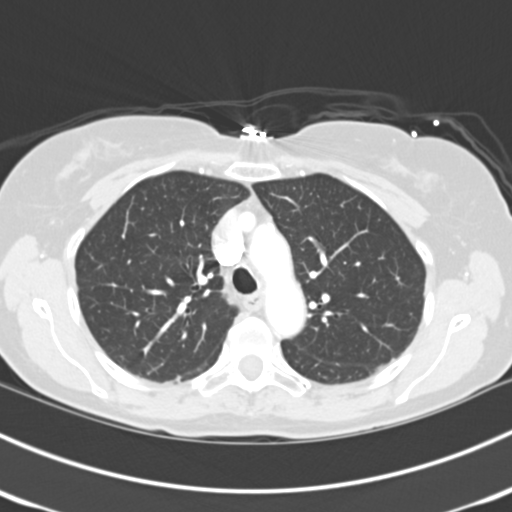
[im 122/155  mediastinal]
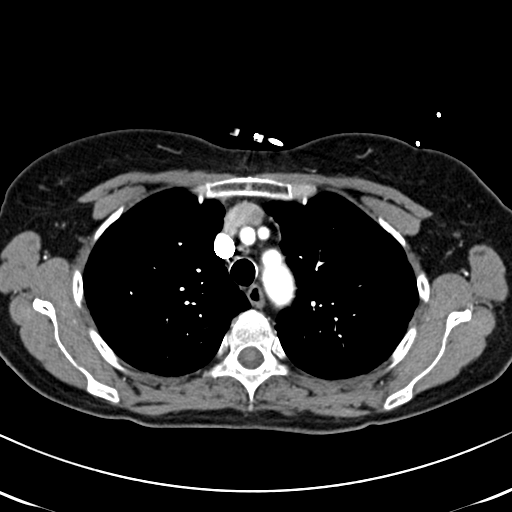
[im 130/155  lung]
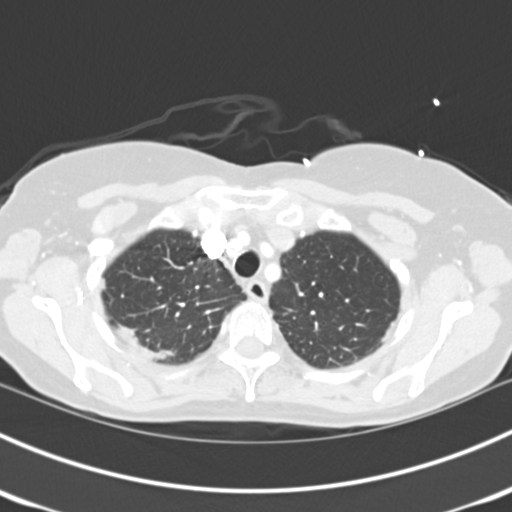
[im 138/155  mediastinal]
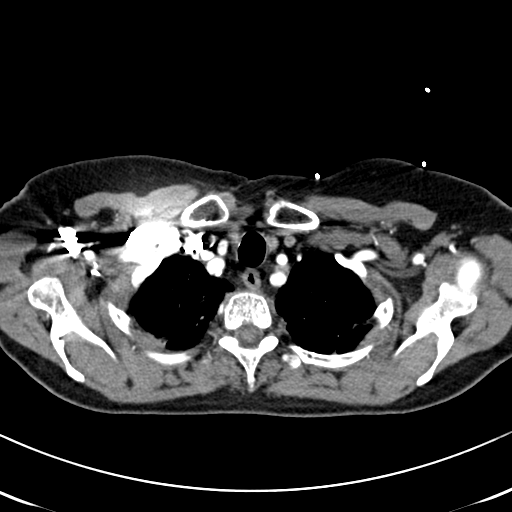
[im 146/155  lung]
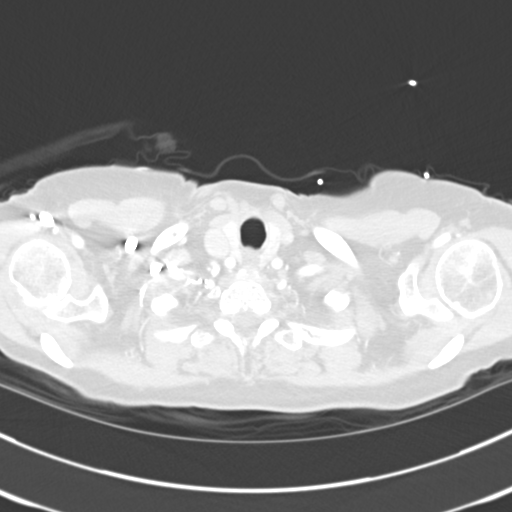

[Series 8: cor arterial mpr · coronal · arterial · 0.62mm/px · 1 of 96 slices shown]
[im 48/96  mediastinal]
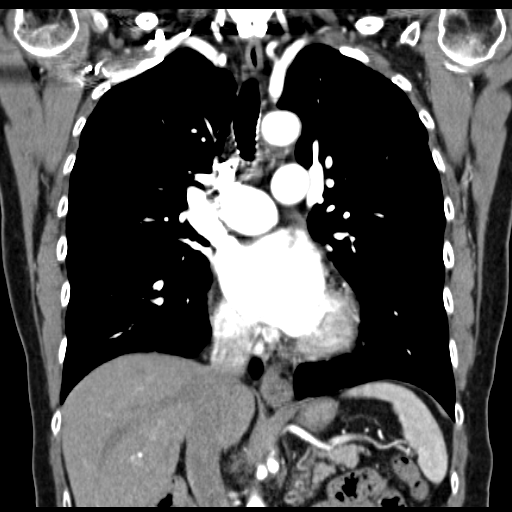

[18 of 36 positions shown; findings below may reference images not displayed]

FINDINGS: Chest:

Nonvascular

Unremarkable appearance of the superficial soft tissues. No axillary
or supraclavicular adenopathy.

Unremarkable appearance of the thoracic inlet.

No mediastinal adenopathy.

Unremarkable appearance of the thoracic esophagus. Small hiatal
hernia.

Linear opacity in the dependent aspects of the bilateral upper lobes
with architectural distortion. This is unchanged from the comparison
CT of 04/03/2011.

Airways are clear.

Partially calcified granuloma of the right middle lobe, unchanged
from prior. No confluent airspace disease, pneumothorax, or pleural
effusion.

Vascular:

Cardiac:

Heart size within normal limits.  No pericardial fluid/thickening.

Calcifications of the left main, left anterior descending,
circumflex, right coronary arteries.

Aorta:

Noncontrast exam demonstrates no crescentic hyperdensity to suggest
intramural hematoma.

Normal course caliber and contour of the thoracic aorta. Diameter of
the ascending aorta and descending aorta unremarkable. No dissection
flap. No periaortic fluid or inflammatory changes.

Scattered calcifications of the aortic arch.

Three vessel arch. Patency of the proximal branch vessels
maintained, with no occlusion or significant stenosis.

No central, lobar, segmental, or proximal subsegmental filling
defects of the pulmonary arterial tree.

Upper abdomen:

Unremarkable appearance of the nonvascular structures of the upper
abdomen.

Atherosclerotic changes of the aorta of the upper abdomen, with no
acute finding identified.

Musculoskeletal:

Partially imaged surgical changes of the cervical region.

No displaced fracture.

Mild degenerative changes of the thoracic spine. No significant bony
canal narrowing
IMPRESSION: No acute finding of the chest CT to account for the patient's
symptoms. No evidence of acute aortic syndrome.

Left main and 3 vessel coronary artery disease. Office based
assessment may be considered.

Small hiatal hernia.

Aortic atherosclerosis.

## 2018-05-24 IMAGING — CR DG CHEST 2V
2 series · 2 of 2 positions shown · non-contrast
Comparison: PA and lateral chest 09/05/2015 and 07/20/2013.

CLINICAL DATA: Right side chest pain radiating into the right
shoulder today.

EXAM:
CHEST  2 VIEW

[chest pa]
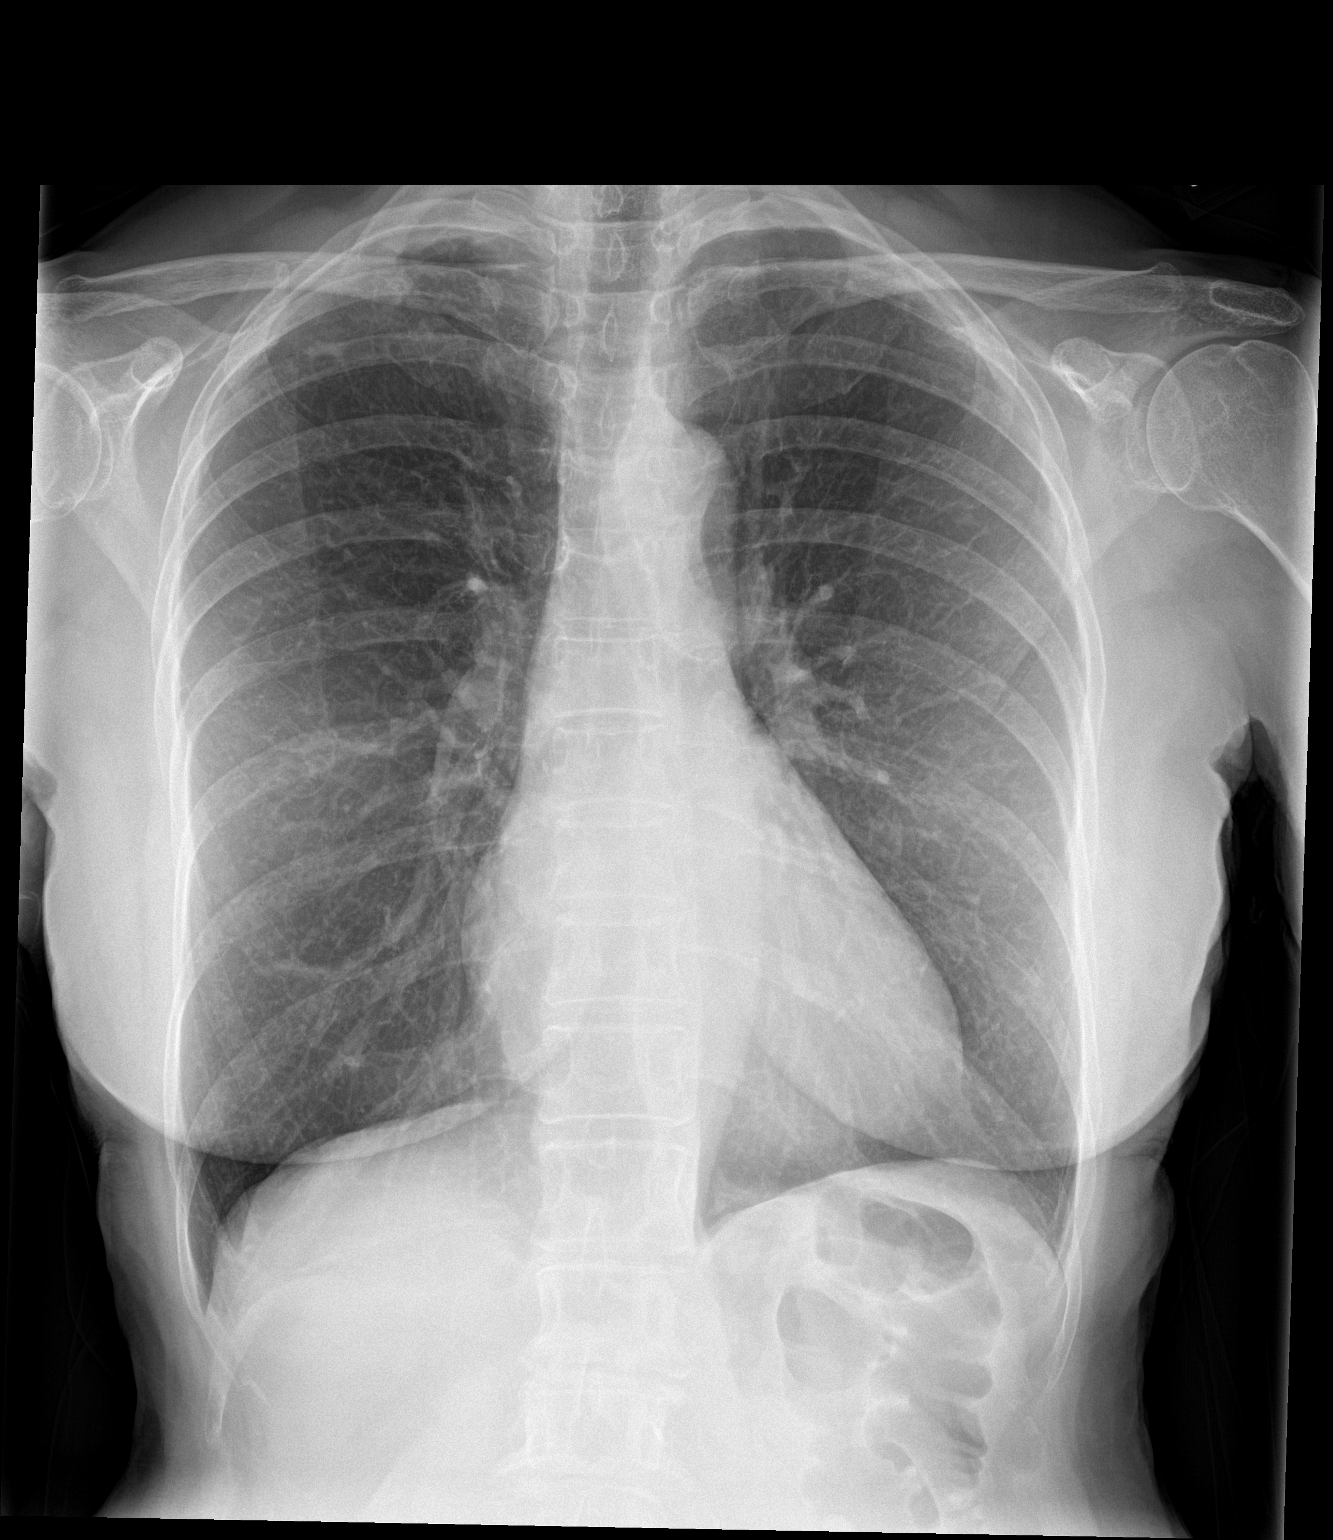

[chest lat]
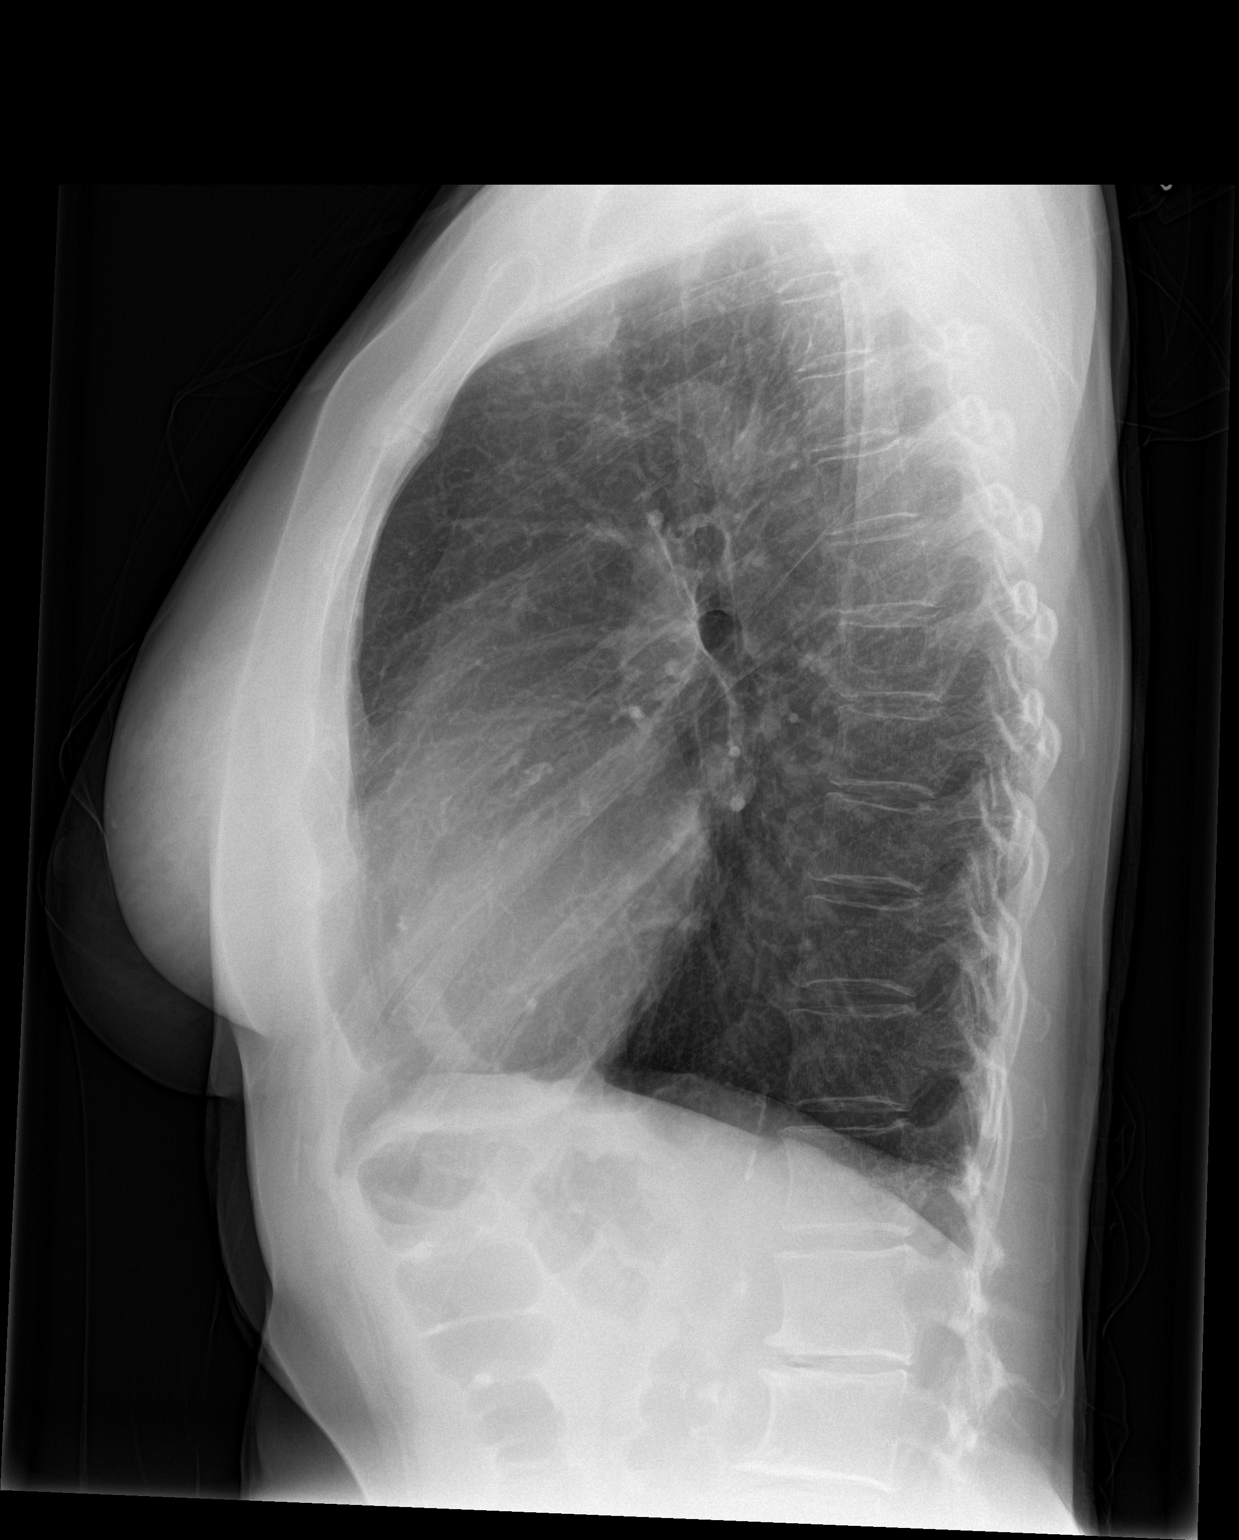

[2 of 2 positions shown; findings below may reference images not displayed]

FINDINGS: The lungs are clear. There is some biapical scarring, unchanged. No
pneumothorax or pleural effusion. Heart size is normal. Aortic
atherosclerosis is noted. No focal bony abnormality.
IMPRESSION: No acute disease.

Atherosclerosis.

## 2018-06-15 ENCOUNTER — Encounter: Payer: Medicare Other | Admitting: Internal Medicine

## 2018-07-01 ENCOUNTER — Other Ambulatory Visit: Payer: Self-pay | Admitting: Internal Medicine

## 2018-07-01 ENCOUNTER — Encounter: Payer: Self-pay | Admitting: Internal Medicine

## 2018-07-10 ENCOUNTER — Encounter: Payer: Self-pay | Admitting: Internal Medicine

## 2018-07-15 ENCOUNTER — Encounter: Payer: Self-pay | Admitting: Internal Medicine

## 2018-08-18 ENCOUNTER — Other Ambulatory Visit: Payer: Self-pay | Admitting: Internal Medicine

## 2018-08-18 DIAGNOSIS — E785 Hyperlipidemia, unspecified: Secondary | ICD-10-CM

## 2018-08-18 DIAGNOSIS — I2510 Atherosclerotic heart disease of native coronary artery without angina pectoris: Secondary | ICD-10-CM

## 2018-09-24 ENCOUNTER — Telehealth: Payer: Self-pay

## 2018-09-24 NOTE — Telephone Encounter (Signed)

## 2018-09-28 ENCOUNTER — Telehealth: Payer: Self-pay | Admitting: Cardiovascular Disease

## 2018-09-29 ENCOUNTER — Telehealth (INDEPENDENT_AMBULATORY_CARE_PROVIDER_SITE_OTHER): Payer: Medicare Other | Admitting: Cardiovascular Disease

## 2018-09-29 VITALS — BP 138/68 | HR 79 | Temp 98.5°F

## 2018-09-29 DIAGNOSIS — I2510 Atherosclerotic heart disease of native coronary artery without angina pectoris: Secondary | ICD-10-CM

## 2018-09-29 DIAGNOSIS — E785 Hyperlipidemia, unspecified: Secondary | ICD-10-CM

## 2018-09-29 NOTE — Progress Notes (Signed)
Virtual Visit via Telephone Note   This visit type was conducted due to national recommendations for restrictions regarding the COVID-19 Pandemic (e.g. social distancing) in an effort to limit this patient's exposure and mitigate transmission in our community.  Due to her co-morbid illnesses, this patient is at least at moderate risk for complications without adequate follow up.  This format is felt to be most appropriate for this patient at this time.  The patient did not have access to video technology/had technical difficulties with video requiring transitioning to audio format only (telephone).  All issues noted in this document were discussed and addressed.  No physical exam could be performed with this format.  Please refer to the patient's chart for her  consent to telehealth for [facility].   Date:  09/29/2018   ID:  Karla Roman, DOB [birth year], MRN [id number]  Patient Location: Home Provider Location: Office  PCP:  Janith Lima, MD  Cardiologist:  Kathlyn Sacramento, MD  Electrophysiologist:  None   Evaluation Performed:  Follow-Up Visit  Chief Complaint: No complaints today  History of Present Illness:    Karla Roman is a 78 y.o. female  who was reached via phone for a follow-up visit regarding coronary atherosclerosis and hyperlipidemia. She had previous atypical chest pain with negative nuclear stress testing in 2018.  Other chronic medical conditions include GERD, irritable bowel syndrome and hyperlipidemia. No history of diabetes or hypertension. She is a lifelong nonsmoker. She has family history of coronary artery disease but not prematurely.  She is known to have three-vessel coronary artery disease calcifications on previous CT scan. Echocardiogram in 2017 showed normal LV systolic function, moderately thickened mitral valve leaflets and mild mitral regurgitation.  She has been doing very well with no recent chest pain, shortness of breath or  palpitations.   The patient does not have symptoms concerning for COVID-19 infection (fever, chills, cough, or new shortness of breath).    Past Medical History:  Diagnosis Date  . Allergic rhinitis   . Allergy   . Anemia    in childhood  . Arthritis   . Asthma   . Atrophic vaginitis   . Cataract   . Diverticulitis   . Diverticulosis   . Duodenal ulcer   . Dyspareunia   . Dysplastic nevus 09/2012, 10/2012   right thigh, severe atypia, resected 10/2012 by Dr Jarome Matin  . Esophagitis    grade 1  . Eustachian tube dysfunction   . GERD (gastroesophageal reflux disease)   . Hiatal hernia   . Hx of adenomatous colonic polyps 02/2009   Tubular adenoma in sigmoid. Diminutive.   . Hyperlipemia   . MVP (mitral valve prolapse)   . Osteopenia   . Schatzki's ring 1995  . Sleep apnea 10/2004   Sleep study by Dr Gwenette Greet. does not need cpap  . Vertigo    Past Surgical History:  Procedure Laterality Date  . ABDOMINAL HYSTERECTOMY  2001   with anterior and posterior vaginal repair  . APPENDECTOMY  1989   ruptured  . BREAST BIOPSY Right 1969   benign  . CERVICAL DISCECTOMY  01/2004   C3-4, C5-6, C6-7  . depuytren's contraction Right 04/2006  . dysplastic nevus  10/2012   right thigh  . ESOPHAGOGASTRODUODENOSCOPY N/A 06/04/2013   Procedure: ESOPHAGOGASTRODUODENOSCOPY (EGD);  Surgeon: Lafayette Dragon, MD;  Location: [facility] ENDOSCOPY;  Service: Endoscopy;  Laterality: N/A;  . LAPAROSCOPY  04/2000   with lysis of adhesions  .  right shoulder fracture Right 03/2013   non-displaced, humeral head, non-operative mgt but she developed frozen shoulder.   . SEPTOPLASTY  4403  . SHOULDER SURGERY Left 01/2004   release of adhesive capsulitis  . TONSILLECTOMY  1947     No outpatient medications have been marked as taking for the 09/29/18 encounter (Telemedicine) with Wellington Hampshire, MD.     Allergies:   Codeine; Levaquin [levofloxacin]; and Oxycodone   Social History   Tobacco Use  . Smoking  status: Never Smoker  . Smokeless tobacco: Never Used  Substance Use Topics  . Alcohol use: No    Alcohol/week: 0.0 standard drinks  . Drug use: No     Family Hx: The patient's family history includes Breast cancer in her maternal aunt; Cancer in her maternal aunt; Coronary artery disease in her brother, brother, father, and mother; Diabetes in her brother and brother; Heart attack in her father; Hypertension in her brother and sister; Hyperthyroidism in her sister; Irritable bowel syndrome in her sister. There is no history of Colon cancer.  ROS:   Please see the history of present illness.     All other systems reviewed and are negative.   Prior CV studies:   The following studies were reviewed today:    Labs/Other Tests and Data Reviewed:    EKG:  No ECG reviewed.  Recent Labs: 01/08/2018: ALT 18; BUN 17; Creatinine, Ser 0.88; Potassium 4.1; Sodium 141; TSH 1.60   Recent Lipid Panel Lab Results  Component Value Date/Time   CHOL 181 01/08/2018 09:47 AM   TRIG 58.0 01/08/2018 09:47 AM   HDL 107.00 01/08/2018 09:47 AM   CHOLHDL 2 01/08/2018 09:47 AM   LDLCALC 63 01/08/2018 09:47 AM   LDLDIRECT 115.1 12/23/2012 10:15 AM    Wt Readings from Last 3 Encounters:  04/14/18 128 lb (58.1 kg)  03/19/18 130 lb (59 kg)  01/08/18 126 lb 8 oz (57.4 kg)     Objective:    Vital Signs:  BP 138/68   Pulse 79   Temp 98.5 F (36.9 C)    VITAL SIGNS:  reviewed  ASSESSMENT & PLAN:    1. Coronary atherosclerosis noted on CT scan: No symptoms to suggest angina. Continue with healthy lifestyle changes in medical therapy of risk factors.  Continue low-dose aspirin.  2. Hyperlipidemia:  Continue treatment with atorvastatin.  I reviewed most recent lipid profile with her which was done in August of last year.  LDL was 63 with triglyceride of 58 and HDL of 107.   COVID-19 Education: The signs and symptoms of COVID-19 were discussed with the patient and how to seek care for testing  (follow up with PCP or arrange E-visit).  The importance of social distancing was discussed today.  Time:   Today, I have spent 11 minutes with the patient with telehealth technology discussing the above problems.     Medication Adjustments/Labs and Tests Ordered: Current medicines are reviewed at length with the patient today.  Concerns regarding medicines are outlined above.   Tests Ordered: No orders of the defined types were placed in this encounter.   Medication Changes: No orders of the defined types were placed in this encounter.   Disposition:  Follow up in 1 year(s)  Signed, Kathlyn Sacramento, MD  09/29/2018 10:09 AM    [place]

## 2018-09-29 NOTE — Patient Instructions (Signed)
Medication Instructions:  Continue same medications If you need a refill on your cardiac medications before your next appointment, please call your pharmacy.   Lab work: None If you have labs (blood work) drawn today and your tests are completely normal, you will receive your results only by: Marland Kitchen MyChart Message (if you have MyChart) OR . A paper copy in the mail If you have any lab test that is abnormal or we need to change your treatment, we will call you to review the results.  Testing/Procedures: None  Follow-Up: At [facility], you and your health needs are our priority.  As part of our continuing mission to provide you with exceptional heart care, we have created designated Provider Care Teams.  These Care Teams include your primary Cardiologist (physician) and Advanced Practice Providers (APPs -  Physician Assistants and Nurse Practitioners) who all work together to provide you with the care you need, when you need it. You will need a follow up appointment in 1 years.  Please call our office 2 months in advance to schedule this appointment.  You may see Kathlyn Sacramento, MD or one of the following Advanced Practice Providers on your designated Care Team:   Kerin Ransom, PA-C Roby Lofts, [place] . Sande Rives, PA-C

## 2018-10-27 ENCOUNTER — Encounter: Payer: Self-pay | Admitting: Internal Medicine

## 2018-10-29 ENCOUNTER — Encounter: Payer: Self-pay | Admitting: Internal Medicine

## 2018-10-29 ENCOUNTER — Ambulatory Visit (INDEPENDENT_AMBULATORY_CARE_PROVIDER_SITE_OTHER): Payer: Medicare Other | Admitting: Internal Medicine

## 2018-10-29 ENCOUNTER — Other Ambulatory Visit (INDEPENDENT_AMBULATORY_CARE_PROVIDER_SITE_OTHER): Payer: Medicare Other

## 2018-10-29 ENCOUNTER — Other Ambulatory Visit: Payer: Self-pay

## 2018-10-29 VITALS — BP 140/70 | HR 75 | Temp 97.8°F | Resp 16 | Ht 62.0 in | Wt 128.0 lb

## 2018-10-29 DIAGNOSIS — N183 Chronic kidney disease, stage 3 unspecified: Secondary | ICD-10-CM | POA: Insufficient documentation

## 2018-10-29 DIAGNOSIS — D539 Nutritional anemia, unspecified: Secondary | ICD-10-CM

## 2018-10-29 DIAGNOSIS — L659 Nonscarring hair loss, unspecified: Secondary | ICD-10-CM | POA: Diagnosis not present

## 2018-10-29 LAB — COMPREHENSIVE METABOLIC PANEL
ALT: 18 U/L (ref 0–35)
AST: 22 U/L (ref 0–37)
Albumin: 4.4 g/dL (ref 3.5–5.2)
Alkaline Phosphatase: 104 U/L (ref 39–117)
BUN: 14 mg/dL (ref 6–23)
CO2: 27 mEq/L (ref 19–32)
Calcium: 9.6 mg/dL (ref 8.4–10.5)
Chloride: 104 mEq/L (ref 96–112)
Creatinine, Ser: 0.9 mg/dL (ref 0.40–1.20)
GFR: 60.33 mL/min (ref >60.00)
Glucose, Bld: 100 mg/dL — ABNORMAL HIGH (ref 70–99)
Potassium: 4.5 mEq/L (ref 3.5–5.1)
Sodium: 140 mEq/L (ref 135–145)
Total Bilirubin: 1.1 mg/dL (ref 0.2–1.2)
Total Protein: 6.8 g/dL (ref 6.0–8.3)

## 2018-10-29 LAB — FOLATE: Folate: >23.9 ng/mL (ref >5.9)

## 2018-10-29 LAB — CBC WITH DIFFERENTIAL/PLATELET
Basophils Absolute: 0.1 10*3/uL (ref 0.0–0.1)
Basophils Relative: 0.8 % (ref 0.0–3.0)
Eosinophils Absolute: 0.1 10*3/uL (ref 0.0–0.7)
Eosinophils Relative: 1.2 % (ref 0.0–5.0)
HCT: 38.4 % (ref 36.0–46.0)
Hemoglobin: 12.9 g/dL (ref 12.0–15.0)
Lymphocytes Relative: 24.9 % (ref 12.0–46.0)
Lymphs Abs: 1.7 10*3/uL (ref 0.7–4.0)
MCHC: 33.5 g/dL (ref 30.0–36.0)
MCV: 93.1 fl (ref 78.0–100.0)
Monocytes Absolute: 0.6 10*3/uL (ref 0.1–1.0)
Monocytes Relative: 8.5 % (ref 3.0–12.0)
Neutro Abs: 4.4 10*3/uL (ref 1.4–7.7)
Neutrophils Relative %: 64.6 % (ref 43.0–77.0)
Platelets: 195 10*3/uL (ref 150.0–400.0)
RBC: 4.13 Mil/uL (ref 3.87–5.11)
RDW: 14 % (ref 11.5–15.5)
WBC: 6.8 10*3/uL (ref 4.0–10.5)

## 2018-10-29 LAB — TSH: TSH: 1.36 u[IU]/mL (ref 0.35–4.50)

## 2018-10-29 LAB — FERRITIN: Ferritin: 33.6 ng/mL (ref 10.0–291.0)

## 2018-10-29 LAB — IBC PANEL
Iron: 109 ug/dL (ref 42–145)
Saturation Ratios: 31.1 % (ref 20.0–50.0)
Transferrin: 250 mg/dL (ref 212.0–360.0)

## 2018-10-29 LAB — VITAMIN B12: Vitamin B-12: 1308 pg/mL — ABNORMAL HIGH (ref 211–911)

## 2018-10-29 MED ORDER — FINASTERIDE 1 MG PO TABS: 1.0000 mg | ORAL_TABLET | Freq: Every day | ORAL | 1 refills | Status: DC

## 2018-10-29 MED ORDER — ROGAINE 2 % EX SOLN: Freq: Two times a day (BID) | CUTANEOUS | 5 refills | Status: DC

## 2018-10-29 NOTE — Progress Notes (Signed)
Subjective:  Patient ID: [name], female    DOB: [birth year]  Age: 78 y.o. MRN: [id number]  CC: Anemia   HPI AYAKA ANDES presents for f/up - She has a history of anemia and now complains that for the last few weeks that the hair on the top of her scalp has been thinning.  The skin on her scalp feels and looks normal.  Outpatient Medications Prior to Visit  Medication Sig Dispense Refill  . acetaminophen (TYLENOL) 500 MG tablet Take 500-1,000 mg by mouth every 6 (six) hours as needed for mild pain or headache.    Marland Kitchen aspirin EC 81 MG tablet Take 1 tablet (81 mg total) by mouth daily. 90 tablet 1  . atorvastatin (LIPITOR) 20 MG tablet TAKE 1 TABLET BY MOUTH  DAILY 90 tablet 1  . Calcium Carbonate (CALTRATE 600) 1500 MG TABS Take 1 tablet by mouth 2 (two) times daily. chewables    . Cholecalciferol (VITAMIN D-3 PO) Take 400 Units by mouth daily.    . cyanocobalamin 500 MCG tablet Take 500 mcg by mouth every morning.    Marland Kitchen omega-3 acid ethyl esters (LOVAZA) 1 G capsule Take 1 g by mouth every morning.    . pantoprazole (PROTONIX) 40 MG tablet TAKE 1 TABLET BY MOUTH  DAILY BEFORE BREAKFAST 90 tablet 1  . saccharomyces boulardii (FLORASTOR) 250 MG capsule Take 1 capsule (250 mg total) by mouth 2 (two) times daily. 60 capsule 0  . traMADol (ULTRAM) 50 MG tablet Take 1 tablet (50 mg total) by mouth every 12 (twelve) hours as needed. 60 tablet 5  . phenazopyridine (PYRIDIUM) 200 MG tablet Take 1 tablet (200 mg total) by mouth 3 (three) times daily as needed (burning sensation with urinating). 10 tablet 0   No facility-administered medications prior to visit.     ROS Review of Systems  Constitutional: Negative.  Negative for fatigue.  HENT: Negative.   Eyes: Negative for visual disturbance.  Respiratory: Negative for cough, chest tightness, shortness of breath and wheezing.   Cardiovascular: Negative for chest pain, palpitations and leg swelling.  Gastrointestinal: Negative for  abdominal pain, diarrhea, nausea and vomiting.  Endocrine: Negative.   Genitourinary: Negative.  Negative for difficulty urinating.  Musculoskeletal: Negative.  Negative for arthralgias and myalgias.  Skin: Negative for color change, pallor and rash.  Neurological: Negative.  Negative for dizziness and weakness.  Hematological: Negative for adenopathy. Does not bruise/bleed easily.  Psychiatric/Behavioral: Negative.     Objective:  BP 140/70 (BP Location: Left Arm, Patient Position: Sitting, Cuff Size: Normal)   Pulse 75   Temp 97.8 F (36.6 C) (Oral)   Resp 16   Ht 5\' 2"  (1.575 m)   Wt 128 lb (58.1 kg)   SpO2 98%   BMI 23.41 kg/m   BP Readings from Last 3 Encounters:  10/29/18 140/70  09/29/18 138/68  04/14/18 (!) 150/82    Wt Readings from Last 3 Encounters:  10/29/18 128 lb (58.1 kg)  04/14/18 128 lb (58.1 kg)  03/19/18 130 lb (59 kg)    Physical Exam Vitals signs reviewed.  HENT:     Head:     Comments: There is mild thinning of the hair on the crown.  The skin is normal.  There is no alopecia, erythema, scale, or lesions.    Nose: Nose normal. No congestion.     Mouth/Throat:     Mouth: Mucous membranes are moist.     Pharynx: No oropharyngeal exudate or  posterior oropharyngeal erythema.  Eyes:     General: No scleral icterus.    Conjunctiva/sclera: Conjunctivae normal.  Neck:     Musculoskeletal: Normal range of motion. No neck rigidity.  Cardiovascular:     Rate and Rhythm: Normal rate and regular rhythm.     Heart sounds: No murmur. No gallop.   Pulmonary:     Effort: Pulmonary effort is normal.     Breath sounds: No stridor. No wheezing, rhonchi or rales.  Abdominal:     General: Abdomen is flat.     Palpations: There is no hepatomegaly, splenomegaly or mass.     Tenderness: There is no abdominal tenderness. There is no guarding.  Musculoskeletal: Normal range of motion.  Lymphadenopathy:     Cervical: No cervical adenopathy.  Skin:    General:  Skin is warm and dry.     Findings: No rash.  Neurological:     General: No focal deficit present.  Psychiatric:        Mood and Affect: Mood normal.        Behavior: Behavior normal.     Lab Results  Component Value Date   WBC 6.8 10/29/2018   HGB 12.9 10/29/2018   HCT 38.4 10/29/2018   PLT 195.0 10/29/2018   GLUCOSE 100 (H) 10/29/2018   CHOL 181 01/08/2018   TRIG 58.0 01/08/2018   HDL 107.00 01/08/2018   LDLDIRECT 115.1 12/23/2012   LDLCALC 63 01/08/2018   ALT 18 10/29/2018   AST 22 10/29/2018   NA 140 10/29/2018   K 4.5 10/29/2018   CL 104 10/29/2018   CREATININE 0.90 10/29/2018   BUN 14 10/29/2018   CO2 27 10/29/2018   TSH 1.36 10/29/2018    Dg Chest 2 View  Result Date: 03/19/2018 CLINICAL DATA:  LEFT posterior chest pain for 7 days. EXAM: CHEST - 2 VIEW COMPARISON:  04/30/2017. FINDINGS: The heart size and mediastinal contours are within normal limits. Both lungs are clear. The visualized skeletal structures are unremarkable. Prior cervical fusion. Biapical pleural thickening is stable. IMPRESSION: No active cardiopulmonary disease.  Stable exam. Electronically Signed   By: Staci Righter M.D.   On: 03/19/2018 15:11    Assessment & Plan:   Maymie was seen today for anemia.  Diagnoses and all orders for this visit:  Deficiency anemia-H&H are normal now.  Her vitamin and mineral levels are all normal. -     CBC with Differential/Platelet; Future -     Vitamin B12; Future -     Ferritin; Future -     Vitamin B1; Future -     Vitamin B6; Future -     IBC panel; Future -     Folate; Future -     Zinc; Future  Balding- Evaluation thus far is negative for secondary causes.  I have asked her to use topical minoxidil and to take oral finasteride to prevent the loss of any more hair. -     Comprehensive metabolic panel; Future -     TSH; Future -     Testosterone, Free & Total-Female [facility]; Future -     minoxidil (ROGAINE) 2 % external solution; Apply topically  2 (two) times daily. -     finasteride (PROPECIA) 1 MG tablet; Take 1 tablet (1 mg total) by mouth daily. -     Cancel: Testosterone , Free and Total -     Testosterone , Free and Total; Future  Chronic renal disease, stage 3, moderately decreased glomerular  filtration rate (GFR) between 30-59 mL/min/1.73 square meter ([place])- Her blood pressure is adequately well controlled.  Will avoid nephrotoxic agents.   I have discontinued [place] Bailiff. Curto's phenazopyridine. I am also having her start on Rogaine and finasteride. Additionally, I am having her maintain her calcium carbonate, acetaminophen, vitamin B-12, omega-3 acid ethyl esters, saccharomyces boulardii, Cholecalciferol (VITAMIN D-3 PO), traMADol, aspirin EC, pantoprazole, and atorvastatin.  Meds ordered this encounter  Medications  . minoxidil (ROGAINE) 2 % external solution    Sig: Apply topically 2 (two) times daily.    Dispense:  60 mL    Refill:  5  . finasteride (PROPECIA) 1 MG tablet    Sig: Take 1 tablet (1 mg total) by mouth daily.    Dispense:  90 tablet    Refill:  1     Follow-up: No follow-ups on file.  Scarlette Calico, MD

## 2018-11-01 LAB — ZINC: Zinc: 89 ug/dL (ref 60–130)

## 2018-11-01 LAB — EXTRA SPECIMEN

## 2018-11-01 LAB — VITAMIN B1: Vitamin B1 (Thiamine): 19 nmol/L (ref 8–30)

## 2018-11-01 LAB — VITAMIN B6: Vitamin B6: 17.9 ng/mL (ref 2.1–21.7)

## 2018-11-02 ENCOUNTER — Encounter: Payer: Self-pay | Admitting: Internal Medicine

## 2018-11-02 DIAGNOSIS — Z85820 Personal history of malignant melanoma of skin: Secondary | ICD-10-CM | POA: Diagnosis not present

## 2018-11-02 DIAGNOSIS — L812 Freckles: Secondary | ICD-10-CM | POA: Diagnosis not present

## 2018-11-02 DIAGNOSIS — D225 Melanocytic nevi of trunk: Secondary | ICD-10-CM | POA: Diagnosis not present

## 2018-11-02 DIAGNOSIS — L821 Other seborrheic keratosis: Secondary | ICD-10-CM | POA: Diagnosis not present

## 2018-11-02 DIAGNOSIS — D1801 Hemangioma of skin and subcutaneous tissue: Secondary | ICD-10-CM | POA: Diagnosis not present

## 2018-11-02 NOTE — Patient Instructions (Signed)
Alopecia Areata, Adult  Alopecia areata is a condition that causes you to lose hair. You may lose hair on your scalp in patches. In some cases, you may lose all the hair on your scalp (alopecia totalis) or all the hair from your face and body (alopecia universalis). Alopecia areata is an autoimmune disease. This means that your body's defense system (immune system) mistakes normal parts of the body for germs or other things that can make you sick. When you have alopecia areata, the immune system attacks the hair follicles. Alopecia areata usually develops in childhood, but it can develop at any age. For some people, their hair grows back on its own and hair loss does not happen again. For others, their hair may fall out and grow back in cycles. The hair loss may last many years. Having this condition can be emotionally difficult, but it is not dangerous. What are the causes? The cause of this condition is not known. What increases the risk? This condition is more likely to develop in people who have:  A family history of alopecia.  A family history of another autoimmune disease, including type 1 diabetes and rheumatoid arthritis.  Asthma and allergies.  Down syndrome. What are the signs or symptoms? Round spots of patchy hair loss on the scalp is the main symptom of this condition. The spots may be mildly itchy. Other symptoms include:  Short dark hairs in the bald patches that are wider at the top (exclamation point hairs).  Dents, white spots, or lines in the fingernails or toenails.  Balding and body hair loss. This is rare. How is this diagnosed? This condition is diagnosed based on your symptoms and family history. Your health care provider will also check your scalp skin, teeth, and nails. Your health care provider may refer you to a specialist in hair and skin disorders (dermatologist). You may also have tests, including:  A hair pull test.  Blood tests or other screening tests  to check for autoimmune diseases, such as thyroid disease or diabetes.  Skin biopsy to confirm the diagnosis.  A procedure to examine the skin with a lighted magnifying instrument (dermoscopy). How is this treated? There is no cure for alopecia areata. Treatment is aimed at promoting the regrowth of hair and preventing the immune system from overreacting. No single treatment is right for all people with alopecia areata. It depends on the type of hair loss you have and how severe it is. Work with your health care provider to find the best treatment for you. Treatment may include:  Having regular checkups to make sure the condition is not getting worse (watchful waiting).  Steroid creams or pills for 6-8 weeks to stop the immune reaction and help hair to regrow more quickly.  Other topical medicines to alter the immune system response and support the hair growth cycle.  Steroid injections.  Therapy and counseling with a support group or therapist if you are having trouble coping with hair loss. Follow these instructions at home:  Learn as much as you can about your condition.  Apply topical creams only as told by your health care provider.  Take over-the-counter and prescription medicines only as told by your health care provider.  Consider getting a wig or products to make hair look fuller or to cover bald spots, if you feel uncomfortable with your appearance.  Get therapy or counseling if you are having a hard time coping with hair loss. Ask your health care provider to recommend  or to cover bald spots, if you feel uncomfortable with your appearance.   Get therapy or counseling if you are having a hard time coping with hair loss. Ask your health care provider to recommend a counselor or support group.   Keep all follow-up visits as told by your health care provider. This is important.  Contact a health care provider if:   Your hair loss gets worse, even with treatment.   You have new symptoms.   You are struggling emotionally.  Summary   Alopecia areata is an autoimmune condition that makes your body's defense system (immune system) attack the hair follicles. This causes you  to lose hair.   Treatments may include regular checkups to make sure that the condition is not getting worse (watchful waiting), medicines, and steroid injections.  This information is not intended to replace advice given to you by your health care provider. Make sure you discuss any questions you have with your health care provider.  Document Released: 12/02/2003 Document Revised: 05/17/2016 Document Reviewed: 05/17/2016  [employer]vier Interactive Patient Education  2019 [employer]

## 2018-11-18 ENCOUNTER — Encounter: Payer: Self-pay | Admitting: Internal Medicine

## 2018-11-19 ENCOUNTER — Ambulatory Visit (INDEPENDENT_AMBULATORY_CARE_PROVIDER_SITE_OTHER): Payer: Medicare Other | Admitting: Internal Medicine

## 2018-11-19 ENCOUNTER — Telehealth: Payer: Self-pay | Admitting: Internal Medicine

## 2018-11-19 ENCOUNTER — Encounter: Payer: Self-pay | Admitting: Internal Medicine

## 2018-11-19 ENCOUNTER — Other Ambulatory Visit: Payer: Self-pay

## 2018-11-19 VITALS — BP 160/60 | HR 87 | Temp 97.8°F | Ht 62.0 in | Wt 127.0 lb

## 2018-11-19 DIAGNOSIS — B029 Zoster without complications: Secondary | ICD-10-CM | POA: Diagnosis not present

## 2018-11-19 MED ORDER — OXYCODONE HCL 5 MG PO TABS: 5.0000 mg | ORAL_TABLET | ORAL | 0 refills | Status: DC | PRN

## 2018-11-19 MED ORDER — PREDNISONE 2.5 MG PO TABS: 7.5000 mg | ORAL_TABLET | Freq: Two times a day (BID) | ORAL | 0 refills | Status: AC

## 2018-11-19 MED ORDER — PREDNISONE 5 MG PO TABS: 15.0000 mg | ORAL_TABLET | Freq: Two times a day (BID) | ORAL | 0 refills | Status: AC

## 2018-11-19 MED ORDER — VALACYCLOVIR HCL 1 G PO TABS: 1000.0000 mg | ORAL_TABLET | Freq: Two times a day (BID) | ORAL | 0 refills | Status: DC

## 2018-11-19 MED ORDER — PREDNISONE 10 MG PO TABS: 30.0000 mg | ORAL_TABLET | Freq: Two times a day (BID) | ORAL | 0 refills | Status: AC

## 2018-11-19 MED ORDER — OXYCODONE HCL 5 MG PO TABS: 5.0000 mg | ORAL_TABLET | ORAL | 0 refills | Status: AC | PRN

## 2018-11-19 NOTE — Progress Notes (Signed)
Subjective:  Patient ID: Karla Roman, female    DOB: 1941/03/22  Age: 78 y.o. MRN: [id number]  CC: Rash   HPI Karla Roman presents for concerns about a 2-day history of right lower back pain and painful rash that extends from the right back into the right hip, thigh, and groin.  Outpatient Medications Prior to Visit  Medication Sig Dispense Refill  . acetaminophen (TYLENOL) 500 MG tablet Take 500-1,000 mg by mouth every 6 (six) hours as needed for mild pain or headache.    Marland Kitchen aspirin EC 81 MG tablet Take 1 tablet (81 mg total) by mouth daily. 90 tablet 1  . atorvastatin (LIPITOR) 20 MG tablet TAKE 1 TABLET BY MOUTH  DAILY 90 tablet 1  . Calcium Carbonate (CALTRATE 600) 1500 MG TABS Take 1 tablet by mouth 2 (two) times daily. chewables    . Cholecalciferol (VITAMIN D-3 PO) Take 400 Units by mouth daily.    . cyanocobalamin 500 MCG tablet Take 500 mcg by mouth every morning.    . minoxidil (ROGAINE) 2 % external solution Apply topically 2 (two) times daily. 60 mL 5  . omega-3 acid ethyl esters (LOVAZA) 1 G capsule Take 1 g by mouth every morning.    . pantoprazole (PROTONIX) 40 MG tablet TAKE 1 TABLET BY MOUTH  DAILY BEFORE BREAKFAST 90 tablet 1  . saccharomyces boulardii (FLORASTOR) 250 MG capsule Take 1 capsule (250 mg total) by mouth 2 (two) times daily. 60 capsule 0  . traMADol (ULTRAM) 50 MG tablet Take 1 tablet (50 mg total) by mouth every 12 (twelve) hours as needed. 60 tablet 5  . finasteride (PROPECIA) 1 MG tablet Take 1 tablet (1 mg total) by mouth daily. (Patient not taking: Reported on 11/19/2018) 90 tablet 1   No facility-administered medications prior to visit.     ROS Review of Systems  Constitutional: Negative for chills, diaphoresis, fatigue and fever.  HENT: Negative.   Eyes: Negative for pain and visual disturbance.  Respiratory: Negative for cough and shortness of breath.   Cardiovascular: Negative for chest pain, palpitations and leg swelling.   Gastrointestinal: Negative for abdominal pain, diarrhea, nausea and vomiting.  Endocrine: Negative.   Genitourinary: Negative.  Negative for difficulty urinating and dysuria.  Musculoskeletal: Positive for back pain.  Skin: Positive for rash. Negative for color change.  Neurological: Negative for dizziness, seizures, weakness, light-headedness and headaches.  Hematological: Negative for adenopathy. Does not bruise/bleed easily.  Psychiatric/Behavioral: Negative.     Objective:  BP (!) 160/60 (BP Location: Left Arm, Patient Position: Sitting, Cuff Size: Normal)   Pulse 87   Temp 97.8 F (36.6 C) (Oral)   Ht 5\' 2"  (1.575 m)   Wt 127 lb (57.6 kg)   SpO2 97%   BMI 23.23 kg/m   BP Readings from Last 3 Encounters:  11/19/18 (!) 160/60  10/29/18 140/70  09/29/18 138/68    Wt Readings from Last 3 Encounters:  11/19/18 127 lb (57.6 kg)  10/29/18 128 lb (58.1 kg)  04/14/18 128 lb (58.1 kg)    Physical Exam Constitutional:      General: She is not in acute distress.    Appearance: She is not ill-appearing, toxic-appearing or diaphoretic.  HENT:     Nose: Nose normal.     Mouth/Throat:     Mouth: Mucous membranes are moist.  Eyes:     General: No scleral icterus.    Conjunctiva/sclera: Conjunctivae normal.  Neck:  Musculoskeletal: Normal range of motion. No neck rigidity.  Cardiovascular:     Rate and Rhythm: Normal rate and regular rhythm.     Heart sounds: No murmur.  Pulmonary:     Effort: Pulmonary effort is normal.     Breath sounds: No stridor. No wheezing, rhonchi or rales.  Abdominal:     General: Abdomen is flat.     Palpations: There is no hepatomegaly, splenomegaly or mass.     Tenderness: There is no abdominal tenderness.  Musculoskeletal: Normal range of motion.     Right lower leg: No edema.     Left lower leg: No edema.  Skin:    Findings: Erythema and rash present. No abscess or petechiae. Rash is vesicular. Rash is not crusting, macular, nodular or  papular.     Comments: There are scattered groups of vesicles on an erythematous base that extend from the right lower back along the L2-L3 dermatome and into the right thigh and groin.  See photo.  Neurological:     General: No focal deficit present.     Mental Status: She is alert and oriented to person, place, and time. Mental status is at baseline.  Psychiatric:        Mood and Affect: Mood normal.        Behavior: Behavior normal.     Lab Results  Component Value Date   WBC 6.8 10/29/2018   HGB 12.9 10/29/2018   HCT 38.4 10/29/2018   PLT 195.0 10/29/2018   GLUCOSE 100 (H) 10/29/2018   CHOL 181 01/08/2018   TRIG 58.0 01/08/2018   HDL 107.00 01/08/2018   LDLDIRECT 115.1 12/23/2012   LDLCALC 63 01/08/2018   ALT 18 10/29/2018   AST 22 10/29/2018   NA 140 10/29/2018   K 4.5 10/29/2018   CL 104 10/29/2018   CREATININE 0.90 10/29/2018   BUN 14 10/29/2018   CO2 27 10/29/2018   TSH 1.36 10/29/2018    Dg Chest 2 View  Result Date: 03/19/2018 CLINICAL DATA:  LEFT posterior chest pain for 7 days. EXAM: CHEST - 2 VIEW COMPARISON:  04/30/2017. FINDINGS: The heart size and mediastinal contours are within normal limits. Both lungs are clear. The visualized skeletal structures are unremarkable. Prior cervical fusion. Biapical pleural thickening is stable. IMPRESSION: No active cardiopulmonary disease.  Stable exam. Electronically Signed   By: Staci Righter M.D.   On: 03/19/2018 15:11    Assessment & Plan:   Sharena was seen today for rash.  Diagnoses and all orders for this visit:  Herpes zoster without complication- I informed her that this was not caused by the recent start of finasteride or minoxidil.  She can continue taking those if she wants to.  I will treat the infection with valacyclovir.  I recommended that she take a 9-day course of steroids to reduce the acute pain and to improve her quality of life during this.  I also recommend that she take oxycodone as needed for  the pain. -     valACYclovir (VALTREX) 1000 MG tablet; Take 1 tablet (1,000 mg total) by mouth 2 (two) times daily for 7 days. -     predniSONE (DELTASONE) 10 MG tablet; Take 3 tablets (30 mg total) by mouth 2 (two) times daily with a meal for 3 days. -     predniSONE (DELTASONE) 2.5 MG tablet; Take 3 tablets (7.5 mg total) by mouth 2 (two) times daily with a meal for 3 days. -  predniSONE (DELTASONE) 5 MG tablet; Take 3 tablets (15 mg total) by mouth 2 (two) times daily with a meal for 3 days. -     Discontinue: oxyCODONE (OXY IR/ROXICODONE) 5 MG immediate release tablet; Take 1 tablet (5 mg total) by mouth every 4 (four) hours as needed for up to 7 days for severe pain. -     oxyCODONE (OXY IR/ROXICODONE) 5 MG immediate release tablet; Take 1 tablet (5 mg total) by mouth every 4 (four) hours as needed for up to 7 days for severe pain.   I am having [place] Bailiff. Colina start on valACYclovir, predniSONE, predniSONE, and predniSONE. I am also having her maintain her calcium carbonate, acetaminophen, vitamin B-12, omega-3 acid ethyl esters, saccharomyces boulardii, Cholecalciferol (VITAMIN D-3 PO), traMADol, aspirin EC, pantoprazole, atorvastatin, Rogaine, finasteride, and oxyCODONE.  Meds ordered this encounter  Medications  . valACYclovir (VALTREX) 1000 MG tablet    Sig: Take 1 tablet (1,000 mg total) by mouth 2 (two) times daily for 7 days.    Dispense:  14 tablet    Refill:  0  . predniSONE (DELTASONE) 10 MG tablet    Sig: Take 3 tablets (30 mg total) by mouth 2 (two) times daily with a meal for 3 days.    Dispense:  18 tablet    Refill:  0  . predniSONE (DELTASONE) 2.5 MG tablet    Sig: Take 3 tablets (7.5 mg total) by mouth 2 (two) times daily with a meal for 3 days.    Dispense:  18 tablet    Refill:  0  . predniSONE (DELTASONE) 5 MG tablet    Sig: Take 3 tablets (15 mg total) by mouth 2 (two) times daily with a meal for 3 days.    Dispense:  18 tablet    Refill:  0  . DISCONTD:  oxyCODONE (OXY IR/ROXICODONE) 5 MG immediate release tablet    Sig: Take 1 tablet (5 mg total) by mouth every 4 (four) hours as needed for up to 7 days for severe pain.    Dispense:  35 tablet    Refill:  0  . oxyCODONE (OXY IR/ROXICODONE) 5 MG immediate release tablet    Sig: Take 1 tablet (5 mg total) by mouth every 4 (four) hours as needed for up to 7 days for severe pain.    Dispense:  35 tablet    Refill:  0     Follow-up: No follow-ups on file.  Scarlette Calico, MD

## 2018-11-19 NOTE — Telephone Encounter (Signed)
Patient stated that the valACYclovir (VALTREX) 1000 MG tablets she was prescribed are really big and she has trouble swallowing tablets that big.  She said this medication does come in a liquid form.  She wants to know if a prescription for the liquid form can be sent to her preferred pharmacy [facility] in [place].

## 2018-11-19 NOTE — Patient Instructions (Signed)
Shingles  Shingles, which is also known as herpes zoster, is an infection that causes a painful skin rash and fluid-filled blisters. It is caused by a virus. Shingles only develops in people who:  Have had chickenpox.  Have been given a medicine to protect against chickenpox (have been vaccinated). Shingles is rare in this group. What are the causes? Shingles is caused by varicella-zoster virus (VZV). This is the same virus that causes chickenpox. After a person is exposed to VZV, the virus stays in the body in an inactive (dormant) state. Shingles develops if the virus is reactivated. This can happen many years after the first (initial) exposure to VZV. It is not known what causes this virus to be reactivated. What increases the risk? People who have had chickenpox or received the chickenpox vaccine are at risk for shingles. Shingles infection is more common in people who:  Are older than age 60.  Have a weakened disease-fighting system (immune system), such as people with: ? HIV. ? AIDS. ? Cancer.  Are taking medicines that weaken the immune system, such as transplant medicines.  Are experiencing a lot of stress. What are the signs or symptoms? Early symptoms of this condition include itching, tingling, and pain in an area on your skin. Pain may be described as burning, stabbing, or throbbing. A few days or weeks after early symptoms start, a painful red rash appears. The rash is usually on one side of the body and has a band-like or belt-like pattern. The rash eventually turns into fluid-filled blisters that break open, change into scabs, and dry up in about 2-3 weeks. At any time during the infection, you may also develop:  A fever.  Chills.  A headache.  An upset stomach. How is this diagnosed? This condition is diagnosed with a skin exam. Skin or fluid samples may be taken from the blisters before a diagnosis is made. These samples are examined under a microscope or sent to  a lab for testing. How is this treated? The rash may last for several weeks. There is not a specific cure for this condition. Your health care provider will probably prescribe medicines to help you manage pain, recover more quickly, and avoid long-term problems. Medicines may include:  Antiviral drugs.  Anti-inflammatory drugs.  Pain medicines.  Anti-itching medicines (antihistamines). If the area involved is on your face, you may be referred to a specialist, such as an eye doctor (ophthalmologist) or an ear, nose, and throat (ENT) doctor (otolaryngologist) to help you avoid eye problems, chronic pain, or disability. Follow these instructions at home: Medicines  Take over-the-counter and prescription medicines only as told by your health care provider.  Apply an anti-itch cream or numbing cream to the affected area as told by your health care provider. Relieving itching and discomfort   Apply cold, wet cloths (cold compresses) to the area of the rash or blisters as told by your health care provider.  Cool baths can be soothing. Try adding baking soda or dry oatmeal to the water to reduce itching. Do not bathe in hot water. Blister and rash care  Keep your rash covered with a loose bandage (dressing). Wear loose-fitting clothing to help ease the pain of material rubbing against the rash.  Keep your rash and blisters clean by washing the area with mild soap and cool water as told by your health care provider.  Check your rash every day for signs of infection. Check for: ? More redness, swelling, or pain. ? Fluid   or blood. ? Warmth. ? Pus or a bad smell.  Do not scratch your rash or pick at your blisters. To help avoid scratching: ? Keep your fingernails clean and cut short. ? Wear gloves or mittens while you sleep, if scratching is a problem. General instructions  Rest as told by your health care provider.  Keep all follow-up visits as told by your health care provider. This  is important.  Wash your hands often with soap and water. If soap and water are not available, use hand sanitizer. Doing this lowers your chance of getting a bacterial skin infection.  Before your blisters change into scabs, your shingles infection can cause chickenpox in people who have never had it or have never been vaccinated against it. To prevent this from happening, avoid contact with other people, especially: ? Babies. ? Pregnant women. ? Children who have eczema. ? Elderly people who have transplants. ? People who have chronic illnesses, such as cancer or AIDS. Contact a health care provider if:  Your pain is not relieved with prescribed medicines.  Your pain does not get better after the rash heals.  You have signs of infection in the rash area, such as: ? More redness, swelling, or pain around the rash. ? Fluid or blood coming from the rash. ? The rash area feeling warm to the touch. ? Pus or a bad smell coming from the rash. Get help right away if:  The rash is on your face or nose.  You have facial pain, pain around your eye area, or loss of feeling on one side of your face.  You have difficulty seeing.  You have ear pain or have ringing in your ear.  You have a loss of taste.  Your condition gets worse. Summary  Shingles, which is also known as herpes zoster, is an infection that causes a painful skin rash and fluid-filled blisters.  This condition is diagnosed with a skin exam. Skin or fluid samples may be taken from the blisters and examined before the diagnosis is made.  Keep your rash covered with a loose bandage (dressing). Wear loose-fitting clothing to help ease the pain of material rubbing against the rash.  Before your blisters change into scabs, your shingles infection can cause chickenpox in people who have never had it or have never been vaccinated against it. This information is not intended to replace advice given to you by your health care  provider. Make sure you discuss any questions you have with your health care provider. Document Released: 04/29/2005 Document Revised: 08/21/2018 Document Reviewed: 01/01/2017 [employer] Patient Education  2020 [employer]  

## 2018-11-20 ENCOUNTER — Other Ambulatory Visit: Payer: Self-pay | Admitting: Internal Medicine

## 2018-11-20 DIAGNOSIS — B029 Zoster without complications: Secondary | ICD-10-CM

## 2018-11-20 MED ORDER — VALACYCLOVIR HCL 500 MG PO TABS: 1000.0000 mg | ORAL_TABLET | Freq: Two times a day (BID) | ORAL | 0 refills | Status: DC

## 2018-11-20 NOTE — Telephone Encounter (Signed)
Okay to send in powder form of Valacyclovir for pt. She is having trouble swallowing the large tablets.

## 2018-11-20 NOTE — Telephone Encounter (Signed)
Her insurance will not cover the powder. Ask her to take 2 smaller tablets twice a day. I have sent an updated prescription to her pharmacy.

## 2018-11-20 NOTE — Telephone Encounter (Signed)
Pt informed new rx has been sent.

## 2018-12-06 ENCOUNTER — Encounter: Payer: Self-pay | Admitting: Internal Medicine

## 2018-12-10 LAB — HM MAMMOGRAPHY

## 2018-12-17 ENCOUNTER — Encounter: Payer: Self-pay | Admitting: Internal Medicine

## 2018-12-17 DIAGNOSIS — Z1231 Encounter for screening mammogram for malignant neoplasm of breast: Secondary | ICD-10-CM | POA: Diagnosis not present

## 2018-12-17 DIAGNOSIS — Z803 Family history of malignant neoplasm of breast: Secondary | ICD-10-CM | POA: Diagnosis not present

## 2018-12-17 DIAGNOSIS — R29890 Loss of height: Secondary | ICD-10-CM | POA: Diagnosis not present

## 2018-12-17 DIAGNOSIS — M8589 Other specified disorders of bone density and structure, multiple sites: Secondary | ICD-10-CM | POA: Diagnosis not present

## 2018-12-17 LAB — HM DEXA SCAN: HM Dexa Scan: -1.4

## 2018-12-22 ENCOUNTER — Encounter: Payer: Self-pay | Admitting: Internal Medicine

## 2018-12-30 ENCOUNTER — Other Ambulatory Visit: Payer: Self-pay | Admitting: Internal Medicine

## 2018-12-30 DIAGNOSIS — E785 Hyperlipidemia, unspecified: Secondary | ICD-10-CM

## 2018-12-30 DIAGNOSIS — I2510 Atherosclerotic heart disease of native coronary artery without angina pectoris: Secondary | ICD-10-CM

## 2019-01-12 ENCOUNTER — Ambulatory Visit (INDEPENDENT_AMBULATORY_CARE_PROVIDER_SITE_OTHER): Payer: Medicare Other | Admitting: *Deleted

## 2019-01-12 DIAGNOSIS — Z0000 Encounter for general adult medical examination without abnormal findings: Secondary | ICD-10-CM

## 2019-01-12 NOTE — Progress Notes (Addendum)
Subjective:   Karla Roman is a 78 y.o. female who presents for Medicare Annual (Subsequent) preventive examination. I connected with patient by a telephone and verified that I am speaking with the correct person using two identifiers. Patient stated full name and DOB. Patient gave permission to continue with telephonic visit. Patient's location was at home and Nurse's location was at [place] office.   Review of Systems:   Cardiac Risk Factors include: advanced age (>78men, >70 women);hypertension  Home Safety/Smoke Alarms: Feels safe in home. Smoke alarms in place.  Living environment; residence and Firearm Safety: 2-story house. Lives with husband, no needs for DME, good support system Seat Belt Safety/Bike Helmet: Wears seat belt.     Objective:     Vitals: There were no vitals taken for this visit.  There is no height or weight on file to calculate BMI.  Advanced Directives 01/12/2019 01/07/2018 11/17/2017 01/07/2017 01/04/2016 11/20/2015 12/21/2014  Does Patient Have a Medical Advance Directive? Yes Yes Yes Yes Yes Yes Yes  Type of Paramedic of Standard;Living will [place];Living will [place];Living will [place];Living will [place];Living will [place];Living will -  Does patient want to make changes to medical advance directive? - - - - No - Patient declined - -  Copy of [place] in Chart? No - copy requested No - copy requested - Yes Yes - Yes  Would patient like information on creating a medical advance directive? - - - - - - -  Pre-existing out of facility DNR order (yellow form or pink MOST form) - - - - - - -    Tobacco Social History   Tobacco Use  Smoking Status Never Smoker  Smokeless Tobacco Never Used     Counseling given: Not Answered  Past Medical History:  Diagnosis Date  . Allergic rhinitis   . Allergy   .  Anemia    in childhood  . Arthritis   . Asthma   . Atrophic vaginitis   . Cataract   . Diverticulitis   . Diverticulosis   . Duodenal ulcer   . Dyspareunia   . Dysplastic nevus 09/2012, 10/2012   right thigh, severe atypia, resected 10/2012 by Dr Jarome Matin  . Esophagitis    grade 1  . Eustachian tube dysfunction   . GERD (gastroesophageal reflux disease)   . Hiatal hernia   . Hx of adenomatous colonic polyps 02/2009   Tubular adenoma in sigmoid. Diminutive.   . Hyperlipemia   . MVP (mitral valve prolapse)   . Osteopenia   . Schatzki's ring 1995  . Sleep apnea 10/2004   Sleep study by Dr Gwenette Greet. does not need cpap  . Vertigo    Past Surgical History:  Procedure Laterality Date  . ABDOMINAL HYSTERECTOMY  2001   with anterior and posterior vaginal repair  . APPENDECTOMY  1989   ruptured  . BREAST BIOPSY Right 1969   benign  . CERVICAL DISCECTOMY  01/2004   C3-4, C5-6, C6-7  . depuytren's contraction Right 04/2006  . dysplastic nevus  10/2012   right thigh  . ESOPHAGOGASTRODUODENOSCOPY N/A 06/04/2013   Procedure: ESOPHAGOGASTRODUODENOSCOPY (EGD);  Surgeon: Lafayette Dragon, MD;  Location[facility]COPY;  Service: Endoscopy;  Laterality: N/A;  . LAPAROSCOPY  04/2000   with lysis of adhesions  . right shoulder fracture Right 03/2013   non-displaced, humeral head, non-operative mgt but she developed  frozen shoulder.   . SEPTOPLASTY  [id number]  . SHOULDER SURGERY Left 01/2004   release of adhesive capsulitis  . TONSILLECTOMY  1947   Family History  Problem Relation Age of Onset  . Heart attack Father        after years of angina  . Coronary artery disease Father   . Coronary artery disease Mother   . Breast cancer Maternal Aunt   . Cancer Maternal Aunt        breast, colon, ovarian  . Diabetes Brother   . Coronary artery disease Brother   . Hypertension Sister   . Hyperthyroidism Sister   . Irritable bowel syndrome Sister   . Coronary artery disease Brother        PCI-stents   . Diabetes Brother   . Hypertension Brother   . Colon cancer Neg Hx    Social History   Socioeconomic History  . Marital status: Married    Spouse name: Ezinne Puentes  . Number of children: 2  . Years of education: 36  . Highest education level: Not on file  Occupational History  . Occupation: Retired from [employer]  . Financial resource strain: Not hard at all  . Food insecurity    Worry: Never true    Inability: Never true  . Transportation needs    Medical: No    Non-medical: No  Tobacco Use  . Smoking status: Never Smoker  . Smokeless tobacco: Never Used  Substance and Sexual Activity  . Alcohol use: No    Alcohol/week: 0.0 standard drinks  . Drug use: No  . Sexual activity: Not Currently  Lifestyle  . Physical activity    Days per week: 5 days    Minutes per session: 40 min  . Stress: Not at all  Relationships  . Social connections    Talks on phone: More than three times a week    Gets together: More than three times a week    Attends religious service: More than 4 times per year    Active member of club or organization: Yes    Attends meetings of clubs or organizations: More than 4 times per year    Relationship status: Married  Other Topics Concern  . Not on file  Social History Narrative   HSG. Married 1959. 1 son - ''34; 1 dtr - '80; 6 grandchildren (1 set of twins). Marriage is in good health - SO with rectal cancer. Work - retired.    Sister in SNF; ? Dementia;   Taking care of grand kids;    Lives in [place]; bedrooms on 1st floor    Outpatient Encounter Medications as of 01/12/2019  Medication Sig  . acetaminophen (TYLENOL) 500 MG tablet Take 500-1,000 mg by mouth every 6 (six) hours as needed for mild pain or headache.  Marland Kitchen aspirin EC 81 MG tablet Take 1 tablet (81 mg total) by mouth daily.  Marland Kitchen atorvastatin (LIPITOR) 20 MG tablet TAKE 1 TABLET BY MOUTH  DAILY  . Calcium Carbonate (CALTRATE 600) 1500 MG TABS Take 1 tablet by mouth 2  (two) times daily. chewables  . Cholecalciferol (VITAMIN D-3 PO) Take 400 Units by mouth daily.  . finasteride (PROPECIA) 1 MG tablet Take 1 mg by mouth daily.  . minoxidil (ROGAINE) 2 % external solution Apply topically 2 (two) times daily.  Marland Kitchen omega-3 acid ethyl esters (LOVAZA) 1 G capsule Take 1 g by mouth every morning.  . pantoprazole (PROTONIX) 40 MG  tablet TAKE 1 TABLET BY MOUTH  DAILY BEFORE BREAKFAST  . saccharomyces boulardii (FLORASTOR) 250 MG capsule Take 1 capsule (250 mg total) by mouth 2 (two) times daily.  . cyanocobalamin 500 MCG tablet Take 500 mcg by mouth every morning.  . [DISCONTINUED] finasteride (PROPECIA) 1 MG tablet Take 1 tablet (1 mg total) by mouth daily. (Patient not taking: Reported on 11/19/2018)  . [DISCONTINUED] traMADol (ULTRAM) 50 MG tablet Take 1 tablet (50 mg total) by mouth every 12 (twelve) hours as needed. (Patient not taking: Reported on 01/12/2019)  . [DISCONTINUED] valACYclovir (VALTREX) 500 MG tablet Take 2 tablets (1,000 mg total) by mouth 2 (two) times daily. (Patient not taking: Reported on 01/12/2019)   No facility-administered encounter medications on file as of 01/12/2019.     Activities of Daily Living In your present state of health, do you have any difficulty performing the following activities: 01/12/2019  Hearing? N  Vision? N  Difficulty concentrating or making decisions? N  Walking or climbing stairs? N  Dressing or bathing? N  Doing errands, shopping? N  Preparing Food and eating ? N  Using the Toilet? N  In the past six months, have you accidently leaked urine? N  Do you have problems with loss of bowel control? N  Managing your Medications? N  Managing your Finances? N  Housekeeping or managing your Housekeeping? N  Some recent data might be hidden    Patient Care Team: Janith Lima, MD as PCP - General (Internal Medicine) Wellington Hampshire, MD as PCP - Cardiology (Cardiology) Stark Klein, MD as Consulting Physician (General  Surgery) Jarome Matin, MD as Consulting Physician (Dermatology) Rutherford Guys, MD as Consulting Physician (Ophthalmology)    Assessment:   This is a routine wellness examination for [place]. Physical assessment deferred to PCP.  Exercise Activities and Dietary recommendations Current Exercise Habits: Home exercise routine, Type of exercise: walking, Time (Minutes): 45, Frequency (Times/Week): 5, Weekly Exercise (Minutes/Week): 225, Intensity: Mild, Exercise limited by: None identified  Diet (meal preparation, eat out, water intake, caffeinated beverages, dairy products, fruits and vegetables): in general, a "healthy" diet  , on average, 2 fast food meals per week.   Reviewed heart healthy diet. Encouraged patient to increase daily water and healthy fluid intake.  Goals      Patient Stated   . maintain health (pt-stated)     Would feel better if she loses weight; weight is normal; May cut back on unhealthy food; hamburger, french fries      Other   . Patient Stated     Stay as healthy and as independent as possible. Continue to be physically and socially active.        Fall Risk Fall Risk  01/12/2019 10/29/2018 01/07/2018 01/07/2017 01/04/2016  Falls in the past year? 1 0 Yes No No  Comment - - - - -  Number falls in past yr: 0 0 2 or more - -  Injury with Fall? 1 0 Yes - -  Risk Factor Category  - - High Fall Risk - -  Follow up - Falls evaluation completed Falls prevention discussed;Education provided - -   Is the patient's home free of loose throw rugs in walkways, pet beds, electrical cords, etc?   yes      Grab bars in the bathroom? yes      Handrails on the stairs?   yes      Adequate lighting?   yes  Depression Screen PHQ 2/9 Scores 01/12/2019  10/29/2018 01/07/2018 01/07/2017  PHQ - 2 Score 1 1 1  0     Cognitive Function MMSE - Mini Mental State Exam 01/07/2018 12/21/2014  Not completed: - Unable to complete  Orientation to time 5 -  Orientation to Place 5 -   Registration 3 -  Attention/ Calculation 5 -  Recall 2 -  Language- name 2 objects 2 -  Language- repeat 1 -  Language- follow 3 step command 3 -  Language- read & follow direction 1 -  Write a sentence 1 -  Copy design 1 -  Total score 29 -     6CIT Screen 01/12/2019  What Year? 0 points  What month? 0 points  What time? 0 points  Count back from 20 0 points  Months in reverse 0 points  Repeat phrase 4 points  Total Score 4    Immunization History  Administered Date(s) Administered  . Influenza Split 02/20/2012  . Influenza Whole 05/15/1998, 02/26/2008, 02/08/2009, 02/16/2010  . Influenza, High Dose Seasonal PF 02/08/2016, 02/25/2017, 02/10/2018  . Influenza,inj,Quad PF,6+ Mos 02/10/2013, 02/11/2014, 02/10/2015  . Pneumococcal Conjugate-13 02/26/2008, 02/11/2014  . Pneumococcal Polysaccharide-23 02/11/2003, 12/13/2010  . Td 12/08/2007  . Tdap 01/08/2018  . Zoster 09/23/2005    Shingrix discussed. Please contact your pharmacy for coverage information.    Screening Tests Health Maintenance  Topic Date Due  . INFLUENZA VACCINE  12/12/2018  . TETANUS/TDAP  01/09/2028  . DEXA SCAN  Completed  . PNA vac Low Risk Adult  Completed      Plan:    Reviewed health maintenance screenings with patient today and relevant education, vaccines, and/or referrals were provided.   Continue to eat heart healthy diet (full of fruits, vegetables, whole grains, lean protein, water--limit salt, fat, and sugar intake) and increase physical activity as tolerated.  Continue doing brain stimulating activities (puzzles, reading, adult coloring books, staying active) to keep memory sharp.   I have personally reviewed and noted the following in the patient's chart:   . Medical and social history . Use of alcohol, tobacco or illicit drugs  . Current medications and supplements . Functional ability and status . Nutritional status . Physical activity . Advanced directives . List of other  physicians . Screenings to include cognitive, depression, and falls . Referrals and appointments  In addition, I have reviewed and discussed with patient certain preventive protocols, quality metrics, and best practice recommendations. A written personalized care plan for preventive services as well as general preventive health recommendations were provided to patient.     Michiel Cowboy, RN  01/12/2019    Medical screening examination/treatment/procedure(s) were performed by non-physician practitioner and as supervising physician I was immediately available for consultation/collaboration. I agree with above. Scarlette Calico, MD

## 2019-01-13 ENCOUNTER — Ambulatory Visit (INDEPENDENT_AMBULATORY_CARE_PROVIDER_SITE_OTHER): Payer: Medicare Other | Admitting: Internal Medicine

## 2019-01-13 ENCOUNTER — Encounter: Payer: Self-pay | Admitting: Internal Medicine

## 2019-01-13 ENCOUNTER — Other Ambulatory Visit (INDEPENDENT_AMBULATORY_CARE_PROVIDER_SITE_OTHER): Payer: Medicare Other

## 2019-01-13 ENCOUNTER — Other Ambulatory Visit: Payer: Self-pay

## 2019-01-13 VITALS — BP 146/72 | HR 69 | Temp 98.1°F | Resp 16 | Ht 62.0 in | Wt 127.8 lb

## 2019-01-13 DIAGNOSIS — E785 Hyperlipidemia, unspecified: Secondary | ICD-10-CM

## 2019-01-13 DIAGNOSIS — I2510 Atherosclerotic heart disease of native coronary artery without angina pectoris: Secondary | ICD-10-CM

## 2019-01-13 DIAGNOSIS — Z0000 Encounter for general adult medical examination without abnormal findings: Secondary | ICD-10-CM

## 2019-01-13 DIAGNOSIS — L659 Nonscarring hair loss, unspecified: Secondary | ICD-10-CM | POA: Diagnosis not present

## 2019-01-13 DIAGNOSIS — Z23 Encounter for immunization: Secondary | ICD-10-CM | POA: Diagnosis not present

## 2019-01-13 LAB — LIPID PANEL
Cholesterol: 168 mg/dL (ref 0–200)
HDL: 96 mg/dL (ref >39.00)
LDL Cholesterol: 60 mg/dL (ref 0–99)
NonHDL: 71.63
Total CHOL/HDL Ratio: 2
Triglycerides: 56 mg/dL (ref 0.0–149.0)
VLDL: 11.2 mg/dL (ref 0.0–40.0)

## 2019-01-13 LAB — TSH: TSH: 1.34 u[IU]/mL (ref 0.35–4.50)

## 2019-01-13 MED ORDER — ASPIRIN EC 81 MG PO TBEC: 81.0000 mg | DELAYED_RELEASE_TABLET | Freq: Every day | ORAL | 1 refills | Status: AC

## 2019-01-13 MED ORDER — SHINGRIX 50 MCG/0.5ML IM SUSR: 0.5000 mL | Freq: Once | INTRAMUSCULAR | 1 refills | Status: AC

## 2019-01-13 NOTE — Progress Notes (Signed)
Subjective:  Patient ID: Karla Roman, female    DOB: 1940-10-09  Age: 78 y.o. MRN: [id number]  CC: Annual Exam and Hyperlipidemia   HPI Karla Roman presents for a CPX.  She is active and denies any recent episodes of CP, DOE, palpitations, edema, or fatigue.  She is tolerating all of her medications and denies dizziness, lightheadedness, or myalgias.  Outpatient Medications Prior to Visit  Medication Sig Dispense Refill  . acetaminophen (TYLENOL) 500 MG tablet Take 500-1,000 mg by mouth every 6 (six) hours as needed for mild pain or headache.    Marland Kitchen atorvastatin (LIPITOR) 20 MG tablet TAKE 1 TABLET BY MOUTH  DAILY 90 tablet 1  . Calcium Carbonate (CALTRATE 600) 1500 MG TABS Take 1 tablet by mouth 2 (two) times daily. chewables    . Cholecalciferol (VITAMIN D-3 PO) Take 400 Units by mouth daily.    . cyanocobalamin 500 MCG tablet Take 500 mcg by mouth every morning.    . finasteride (PROPECIA) 1 MG tablet Take 1 mg by mouth daily.    . minoxidil (ROGAINE) 2 % external solution Apply topically 2 (two) times daily. 60 mL 5  . pantoprazole (PROTONIX) 40 MG tablet TAKE 1 TABLET BY MOUTH  DAILY BEFORE BREAKFAST 90 tablet 1  . aspirin EC 81 MG tablet Take 1 tablet (81 mg total) by mouth daily. 90 tablet 1  . omega-3 acid ethyl esters (LOVAZA) 1 G capsule Take 1 g by mouth every morning.    . saccharomyces boulardii (FLORASTOR) 250 MG capsule Take 1 capsule (250 mg total) by mouth 2 (two) times daily. 60 capsule 0   No facility-administered medications prior to visit.     ROS Review of Systems  Constitutional: Negative.  Negative for appetite change, diaphoresis, fatigue and unexpected weight change.  HENT: Negative.   Eyes: Negative for visual disturbance.  Respiratory: Negative for cough, chest tightness, shortness of breath and wheezing.   Cardiovascular: Negative for chest pain, palpitations and leg swelling.  Gastrointestinal: Negative for abdominal pain, constipation,  diarrhea, nausea and vomiting.  Endocrine: Negative.   Genitourinary: Negative.  Negative for difficulty urinating.  Musculoskeletal: Negative.  Negative for myalgias.  Skin: Negative.  Negative for color change and pallor.  Neurological: Negative.  Negative for dizziness, weakness, light-headedness and headaches.  Hematological: Negative for adenopathy. Does not bruise/bleed easily.  Psychiatric/Behavioral: Negative.     Objective:  BP (!) 146/72 (BP Location: Left Arm, Patient Position: Sitting, Cuff Size: Normal)   Pulse 69   Temp 98.1 F (36.7 C) (Oral)   Resp 16   Ht 5\' 2"  (1.575 m)   Wt 127 lb 12 oz (57.9 kg)   SpO2 96%   BMI 23.37 kg/m   BP Readings from Last 3 Encounters:  01/13/19 (!) 146/72  11/19/18 (!) 160/60  10/29/18 140/70    Wt Readings from Last 3 Encounters:  01/13/19 127 lb 12 oz (57.9 kg)  11/19/18 127 lb (57.6 kg)  10/29/18 128 lb (58.1 kg)    Physical Exam Vitals signs reviewed.  Constitutional:      Appearance: She is not ill-appearing or diaphoretic.  HENT:     Nose: Nose normal.     Mouth/Throat:     Mouth: Mucous membranes are moist.     Pharynx: Oropharynx is clear. No oropharyngeal exudate.  Eyes:     General: No scleral icterus.    Conjunctiva/sclera: Conjunctivae normal.  Neck:     Musculoskeletal: Normal range of motion and  neck supple.  Cardiovascular:     Rate and Rhythm: Normal rate and regular rhythm.     Heart sounds: No murmur.  Pulmonary:     Effort: Pulmonary effort is normal.     Breath sounds: No stridor. No wheezing, rhonchi or rales.  Abdominal:     General: Abdomen is flat. Bowel sounds are normal. There is no distension.     Palpations: Abdomen is soft. There is no hepatomegaly or splenomegaly.     Tenderness: There is no abdominal tenderness.  Genitourinary:    Comments: Breast, GU, rectal exams were deferred at her request. Musculoskeletal: Normal range of motion.     Right lower leg: No edema.     Left lower  leg: No edema.  Skin:    General: Skin is warm and dry.  Neurological:     General: No focal deficit present.     Mental Status: She is alert.  Psychiatric:        Mood and Affect: Mood normal.        Behavior: Behavior normal.     Lab Results  Component Value Date   WBC 6.8 10/29/2018   HGB 12.9 10/29/2018   HCT 38.4 10/29/2018   PLT 195.0 10/29/2018   GLUCOSE 100 (H) 10/29/2018   CHOL 168 01/13/2019   TRIG 56.0 01/13/2019   HDL 96.00 01/13/2019   LDLDIRECT 115.1 12/23/2012   LDLCALC 60 01/13/2019   ALT 18 10/29/2018   AST 22 10/29/2018   NA 140 10/29/2018   K 4.5 10/29/2018   CL 104 10/29/2018   CREATININE 0.90 10/29/2018   BUN 14 10/29/2018   CO2 27 10/29/2018   TSH 1.34 01/13/2019    Dg Chest 2 View  Result Date: 03/19/2018 CLINICAL DATA:  LEFT posterior chest pain for 7 days. EXAM: CHEST - 2 VIEW COMPARISON:  04/30/2017. FINDINGS: The heart size and mediastinal contours are within normal limits. Both lungs are clear. The visualized skeletal structures are unremarkable. Prior cervical fusion. Biapical pleural thickening is stable. IMPRESSION: No active cardiopulmonary disease.  Stable exam. Electronically Signed   By: Staci Righter M.D.   On: 03/19/2018 15:11    Assessment & Plan:   Karla Roman was seen today for annual exam and hyperlipidemia.  Diagnoses and all orders for this visit:  Coronary artery disease involving native coronary artery of native heart without angina pectoris- She has had no recent episodes of angina.  Will continue to work on risk factor modifications. -     Lipid panel; Future -     aspirin EC 81 MG tablet; Take 1 tablet (81 mg total) by mouth daily.  Hyperlipidemia with target LDL less than 130- She has achieved her LDL goal and is doing well on the statin. -     Lipid panel; Future -     TSH; Future  Routine general medical examination at a health care facility- Exam completed, labs reviewed, vaccines reviewed and updated, screening  for colon, breast, and cervical cancers is not indicated.  Patient education was given.  Need for influenza vaccination -     Flu Vaccine QUAD High Dose(Fluad)  Need for shingles vaccine -     Zoster Vaccine Adjuvanted [facility]) injection; Inject 0.5 mLs into the muscle once for 1 dose.  Need for pneumococcal vaccination -     Pneumococcal polysaccharide vaccine 23-valent greater than or equal to 2yo subcutaneous/IM   I have discontinued [place] Bailiff. Calles's omega-3 acid ethyl esters and saccharomyces boulardii. I  am also having her start on Shingrix. Additionally, I am having her maintain her calcium carbonate, acetaminophen, vitamin B-12, Cholecalciferol (VITAMIN D-3 PO), Rogaine, atorvastatin, pantoprazole, finasteride, and aspirin EC.  Meds ordered this encounter  Medications  . aspirin EC 81 MG tablet    Sig: Take 1 tablet (81 mg total) by mouth daily.    Dispense:  90 tablet    Refill:  1  . Zoster Vaccine Adjuvanted [facility]) injection    Sig: Inject 0.5 mLs into the muscle once for 1 dose.    Dispense:  0.5 mL    Refill:  1     Follow-up: Return in about 6 months (around 07/13/2019).  Scarlette Calico, MD

## 2019-01-13 NOTE — Patient Instructions (Signed)

## 2019-01-14 ENCOUNTER — Encounter: Payer: Self-pay | Admitting: Internal Medicine

## 2019-01-15 ENCOUNTER — Telehealth: Payer: Self-pay | Admitting: Internal Medicine

## 2019-01-15 NOTE — Telephone Encounter (Signed)
Called pt and pt stated that she wanted to speak to [place].   I review the wellness note and did not see.   Pt stated that there was something she could take along with the shingle vaccine in the event that she decides to take it.

## 2019-01-15 NOTE — Telephone Encounter (Signed)
Copied from [place] [id number]. Topic: General - Other >> Jan 15, 2019  9:33 AM Keene Breath wrote: Reason for CRM: Patient called to ask the nurse to call her regarding a medication she told her to take with her shingles shot.  Patient cannot remember what the nurse told her.  CB# [phone]

## 2019-01-17 LAB — TESTOSTERONE, FREE & TOTAL
Free Testosterone: 0.6 pg/mL (ref 0.2–3.7)
Testosterone, Total, LC-MS-MS: 9 ng/dL (ref 2–45)

## 2019-01-19 NOTE — Telephone Encounter (Signed)
Nurse called patient back in response to shingle shot. Nurse discussed with patient during AWV that the Shingrix vaccine can cause flu-like symptoms and injection site pain. Nurse explained further that some individuals took [place] several days before they took the vaccine to build up their immune system and that some individuals also take tylenol as directed to help with any injection site pain or fever and chills that may develop after the vaccine is given. Patient appreciated the callback, stating she is researching the vaccine and trying to decide if she will take the vaccine.

## 2019-04-15 ENCOUNTER — Other Ambulatory Visit: Payer: Self-pay | Admitting: Internal Medicine

## 2019-04-15 DIAGNOSIS — L659 Nonscarring hair loss, unspecified: Secondary | ICD-10-CM

## 2019-04-19 DIAGNOSIS — H524 Presbyopia: Secondary | ICD-10-CM | POA: Diagnosis not present

## 2019-04-19 DIAGNOSIS — H5203 Hypermetropia, bilateral: Secondary | ICD-10-CM | POA: Diagnosis not present

## 2019-04-19 DIAGNOSIS — H25813 Combined forms of age-related cataract, bilateral: Secondary | ICD-10-CM | POA: Diagnosis not present

## 2019-05-10 DIAGNOSIS — Z85820 Personal history of malignant melanoma of skin: Secondary | ICD-10-CM | POA: Diagnosis not present

## 2019-05-10 DIAGNOSIS — D225 Melanocytic nevi of trunk: Secondary | ICD-10-CM | POA: Diagnosis not present

## 2019-05-10 DIAGNOSIS — L821 Other seborrheic keratosis: Secondary | ICD-10-CM | POA: Diagnosis not present

## 2019-05-10 DIAGNOSIS — L812 Freckles: Secondary | ICD-10-CM | POA: Diagnosis not present

## 2019-05-10 DIAGNOSIS — L918 Other hypertrophic disorders of the skin: Secondary | ICD-10-CM | POA: Diagnosis not present

## 2019-06-17 ENCOUNTER — Other Ambulatory Visit: Payer: Self-pay | Admitting: Internal Medicine

## 2019-06-25 ENCOUNTER — Telehealth: Payer: Self-pay

## 2019-06-25 NOTE — Telephone Encounter (Signed)
Pt was calling about her husband. Closing this note.

## 2019-06-25 NOTE — Telephone Encounter (Signed)
New message  ° ° °Returning call back to nurse.  °

## 2019-07-06 ENCOUNTER — Telehealth: Payer: Self-pay

## 2019-07-06 NOTE — Telephone Encounter (Deleted)
error 

## 2019-07-28 DIAGNOSIS — L918 Other hypertrophic disorders of the skin: Secondary | ICD-10-CM | POA: Diagnosis not present

## 2019-08-30 ENCOUNTER — Other Ambulatory Visit: Payer: Self-pay | Admitting: Internal Medicine

## 2019-08-30 DIAGNOSIS — E785 Hyperlipidemia, unspecified: Secondary | ICD-10-CM

## 2019-08-30 DIAGNOSIS — I2510 Atherosclerotic heart disease of native coronary artery without angina pectoris: Secondary | ICD-10-CM

## 2019-09-01 ENCOUNTER — Ambulatory Visit: Payer: Medicare Other | Admitting: Family

## 2019-09-16 ENCOUNTER — Other Ambulatory Visit: Payer: Self-pay | Admitting: Infectious Diseases

## 2019-09-16 DIAGNOSIS — Z1231 Encounter for screening mammogram for malignant neoplasm of breast: Secondary | ICD-10-CM

## 2019-10-05 ENCOUNTER — Ambulatory Visit: Payer: Medicare Other | Admitting: Cardiovascular Disease

## 2019-10-06 ENCOUNTER — Other Ambulatory Visit: Payer: Self-pay | Admitting: Internal Medicine

## 2019-10-06 DIAGNOSIS — L659 Nonscarring hair loss, unspecified: Secondary | ICD-10-CM

## 2019-10-07 ENCOUNTER — Ambulatory Visit: Payer: Medicare Other | Admitting: Cardiovascular Disease

## 2019-10-07 ENCOUNTER — Encounter: Payer: Self-pay | Admitting: Cardiovascular Disease

## 2019-10-07 VITALS — BP 138/60 | HR 70 | Ht 62.0 in | Wt 121.2 lb

## 2019-10-07 DIAGNOSIS — I2510 Atherosclerotic heart disease of native coronary artery without angina pectoris: Secondary | ICD-10-CM

## 2019-10-07 DIAGNOSIS — E785 Hyperlipidemia, unspecified: Secondary | ICD-10-CM

## 2019-10-07 NOTE — Progress Notes (Signed)
Cardiology Office Note   Date:  10/07/2019   ID:  Karla Roman, DOB 1941-05-07, MRN [id number]  PCP:  Karla Ramsay, MD  Cardiologist:  Karla Roman  Chief Complaint  Patient presents with  . office visit    Pt has no complaints. Meds verbally reviewed w/ pt.       History of Present Illness: VEEKSHA Roman is a 79 y.o. female who is here today for a follow-up visit regarding coronary atherosclerosis and hyperlipidemia. She had previous atypical chest pain with negative nuclear stress testing in 2018.  Other chronic medical conditions include GERD, irritable bowel syndrome and hyperlipidemia. No history of diabetes or hypertension. She is a lifelong nonsmoker. She has family history of coronary artery disease but not prematurely. Echocardiogram in 2017 showed normal LV systolic function, moderately thickened mitral valve leaflets and mild mitral regurgitation.  She is known to have three-vessel coronary artery disease calcifications on previous CT scan.  She has been under stress lately given declining memory of her husband.  He started having issues after he received the second dose of COVID-19 vaccine.  No chest pain, shortness of breath or palpitations.  She did stop taking her atorvastatin recently because she does not like taking medications.  Lipid profile showed worsening hyperlipidemia and that she resumed the medication.  She is not having any side effects.    Past Medical History:  Diagnosis Date  . Allergic rhinitis   . Allergy   . Anemia    in childhood  . Arthritis   . Asthma   . Atrophic vaginitis   . Cataract   . Diverticulitis   . Diverticulosis   . Duodenal ulcer   . Dyspareunia   . Dysplastic nevus 09/2012, 10/2012   right thigh, severe atypia, resected 10/2012 by Karla Roman  . Esophagitis    grade 1  . Eustachian tube dysfunction   . GERD (gastroesophageal reflux disease)   . Hiatal hernia   . Hx of adenomatous colonic polyps 02/2009   Tubular adenoma in sigmoid. Diminutive.   . Hyperlipemia   . MVP (mitral valve prolapse)   . Osteopenia   . Schatzki's ring 1995  . Sleep apnea 10/2004   Sleep study by Karla Roman. does not need cpap  . Vertigo     Past Surgical History:  Procedure Laterality Date  . ABDOMINAL HYSTERECTOMY  2001   with anterior and posterior vaginal repair  . APPENDECTOMY  1989   ruptured  . BREAST BIOPSY Right 1969   benign  . CERVICAL DISCECTOMY  01/2004   C3-4, C5-6, C6-7  . depuytren's contraction Right 04/2006  . dysplastic nevus  10/2012   right thigh  . ESOPHAGOGASTRODUODENOSCOPY N/A 06/04/2013   Procedure: ESOPHAGOGASTRODUODENOSCOPY (EGD);  Surgeon: Karla Dragon, MD;  Location: [facility] ENDOSCOPY;  Service: Endoscopy;  Laterality: N/A;  . LAPAROSCOPY  04/2000   with lysis of adhesions  . right shoulder fracture Right 03/2013   non-displaced, humeral head, non-operative mgt but she developed frozen shoulder.   . SEPTOPLASTY  [id number]  . SHOULDER SURGERY Left 01/2004   release of adhesive capsulitis  . TONSILLECTOMY  1947     Current Outpatient Medications  Medication Sig Dispense Refill  . acetaminophen (TYLENOL) 500 MG tablet Take 500-1,000 mg by mouth every 6 (six) hours as needed for mild pain or headache.    Marland Kitchen aspirin EC 81 MG tablet Take 1 tablet (81 mg total) by mouth daily. 90 tablet 1  .  atorvastatin (LIPITOR) 20 MG tablet TAKE 1 TABLET BY MOUTH  DAILY 90 tablet 1  . Calcium Carbonate (CALTRATE 600) 1500 MG TABS Take 1 tablet by mouth 2 (two) times daily. chewables    . Cholecalciferol (VITAMIN D-3 PO) Take 400 Units by mouth daily.    . cyanocobalamin 500 MCG tablet Take 500 mcg by mouth every morning.    . finasteride (PROPECIA) 1 MG tablet TAKE 1 TABLET BY MOUTH EVERY DAY 90 tablet 1  . minoxidil (ROGAINE) 2 % external solution Apply topically 2 (two) times daily. 60 mL 5  . pantoprazole (PROTONIX) 40 MG tablet TAKE 1 TABLET BY MOUTH  DAILY BEFORE BREAKFAST 90 tablet 1   No current  facility-administered medications for this visit.    Allergies:   Codeine, Levaquin [levofloxacin], and Oxycodone    Social History:  The patient  reports that she has never smoked. She has never used smokeless tobacco. She reports that she does not drink alcohol or use drugs.   Family History:  The patient's family history includes Breast cancer in her maternal aunt; Cancer in her maternal aunt; Coronary artery disease in her brother, brother, father, and mother; Diabetes in her brother and brother; Heart attack in her father; Hypertension in her brother and sister; Hyperthyroidism in her sister; Irritable bowel syndrome in her sister.    ROS:  Please see the history of present illness.   Otherwise, review of systems are positive for none.   All other systems are reviewed and negative.    PHYSICAL EXAM: VS:  BP 138/60 (BP Location: Left Arm, Patient Position: Sitting, Cuff Size: Normal)   Pulse 70   Ht 5\' 2"  (1.575 m)   Wt 121 lb 4 oz (55 kg)   SpO2 98%   BMI 22.18 kg/m  , BMI Body mass index is 22.18 kg/m. GEN: Well nourished, well developed, in no acute distress  HEENT: normal  Neck: no JVD, carotid bruits, or masses Cardiac: RRR; no murmurs, rubs, or gallops,no edema  Respiratory:  clear to auscultation bilaterally, normal work of breathing GI: soft, nontender, nondistended, + BS MS: no deformity or atrophy  Skin: warm and dry, no rash Neuro:  Strength and sensation are intact Psych: euthymic mood, full affect   EKG:  EKG is ordered today. EKG today was reviewed and showed normal sinus rhythm withv no significant ST or T wave changes.  Recent Labs: 10/29/2018: ALT 18; BUN 14; Creatinine, Ser 0.90; Hemoglobin 12.9; Platelets 195.0; Potassium 4.5; Sodium 140 01/13/2019: TSH 1.34    Lipid Panel    Component Value Date/Time   CHOL 168 01/13/2019 1049   TRIG 56.0 01/13/2019 1049   HDL 96.00 01/13/2019 1049   CHOLHDL 2 01/13/2019 1049   VLDL 11.2 01/13/2019 1049    LDLCALC 60 01/13/2019 1049   LDLDIRECT 115.1 12/23/2012 1015      Wt Readings from Last 3 Encounters:  10/07/19 121 lb 4 oz (55 kg)  01/13/19 127 lb 12 oz (57.9 kg)  11/19/18 127 lb (57.6 kg)      No flowsheet data found.    ASSESSMENT AND PLAN:  1. Coronary atherosclerosis noted on CT scan: No symptoms to suggest angina. Continue with healthy lifestyle changes in medical therapy of risk factors.  Continue low-dose aspirin.  2. Hyperlipidemia:  The patient recently stopped taking atorvastatin on her own.  Follow-up lipid profile showed LDL of 127.  Previous LDL on treatment was in the 60s.  I discussed with her the rationale  for treatment with a statin and she is agreeable to continue the medication.   Disposition:   FU with me in 1 year.   Signed,  Karla Sacramento, MD  10/07/2019 10:30 AM    [place]

## 2019-10-07 NOTE — Patient Instructions (Signed)
Medication Instructions:  Your physician recommends that you continue on your current medications as directed. Please refer to the Current Medication list given to you today.  *If you need a refill on your cardiac medications before your next appointment, please call your pharmacy*   Lab Work: None ordered  If you have labs (blood work) drawn today and your tests are completely normal, you will receive your results only by: Marland Kitchen MyChart Message (if you have MyChart) OR . A paper copy in the mail If you have any lab test that is abnormal or we need to change your treatment, we will call you to review the results.   Testing/Procedures: None ordered    Follow-Up: At [facility], you and your health needs are our priority.  As part of our continuing mission to provide you with exceptional heart care, we have created designated Provider Care Teams.  These Care Teams include your primary Cardiologist (physician) and Advanced Practice Providers (APPs -  Physician Assistants and Nurse Practitioners) who all work together to provide you with the care you need, when you need it.  We recommend signing up for the patient portal called "MyChart".  Sign up information is provided on this After Visit Summary.  MyChart is used to connect with patients for Virtual Visits (Telemedicine).  Patients are able to view lab/test results, encounter notes, upcoming appointments, etc.  Non-urgent messages can be sent to your provider as well.   To learn more about what you can do with MyChart, go to [email or web address].    Your next appointment:   12 month(s)  The format for your next appointment:   In Person  Provider:    You may see Kathlyn Sacramento, MD or one of the following Advanced Practice Providers on your designated Care Team:    Murray Hodgkins, NP  Christell Faith, PA-C  Marrianne Mood, PA-C

## 2019-10-13 NOTE — Addendum Note (Signed)
Addended by: Casimer Lanius on: 10/13/2019 01:20 PM   Modules accepted: Orders

## 2019-10-18 ENCOUNTER — Other Ambulatory Visit: Payer: Self-pay | Admitting: Infectious Diseases

## 2019-10-18 DIAGNOSIS — I700 Atherosclerosis of aorta: Secondary | ICD-10-CM

## 2019-10-19 ENCOUNTER — Other Ambulatory Visit: Payer: Self-pay | Admitting: Infectious Diseases

## 2019-10-19 DIAGNOSIS — Z780 Asymptomatic menopausal state: Secondary | ICD-10-CM

## 2019-10-25 ENCOUNTER — Ambulatory Visit
Admission: RE | Admit: 2019-10-25 | Discharge: 2019-10-25 | Disposition: A | Discharge status: Home / Self Care | Payer: Medicare Other | Source: Ambulatory Visit | Attending: Infectious Diseases | Admitting: Infectious Diseases

## 2019-10-25 ENCOUNTER — Other Ambulatory Visit: Payer: Self-pay

## 2019-10-25 DIAGNOSIS — I700 Atherosclerosis of aorta: Secondary | ICD-10-CM

## 2019-11-07 ENCOUNTER — Ambulatory Visit
Admission: EM | Admit: 2019-11-07 | Discharge: 2019-11-07 | Disposition: A | Discharge status: Home / Self Care | Payer: Medicare Other | Attending: Emergency Medicine | Admitting: Emergency Medicine

## 2019-11-07 ENCOUNTER — Ambulatory Visit (INDEPENDENT_AMBULATORY_CARE_PROVIDER_SITE_OTHER): Payer: Medicare Other

## 2019-11-07 ENCOUNTER — Other Ambulatory Visit: Payer: Self-pay

## 2019-11-07 ENCOUNTER — Encounter: Payer: Self-pay | Admitting: Emergency Medicine

## 2019-11-07 DIAGNOSIS — S52612A Displaced fracture of left ulna styloid process, initial encounter for closed fracture: Secondary | ICD-10-CM

## 2019-11-07 DIAGNOSIS — S52502A Unspecified fracture of the lower end of left radius, initial encounter for closed fracture: Secondary | ICD-10-CM

## 2019-11-07 NOTE — ED Triage Notes (Signed)
Patient also c/o left shoulder pain.

## 2019-11-07 NOTE — ED Triage Notes (Signed)
Patient in today after falling at home today. Patient states she tripped on some shoes and caught herself with her left wrist.

## 2019-11-07 NOTE — Discharge Instructions (Signed)
You were seen for a fall and are being treated for a fracture of your wrist.   Call orthopedics office as soon as possible for an appointment.  Tell them you have a "confirmed fracture".  You are being placed in a splint.  The splint is not to be removed until evaluated by orthopedics.  Treat your pain with Tylenol as needed.  Take care, Dr. Marland Kitchen, NP-c

## 2019-11-07 NOTE — ED Provider Notes (Signed)
[facility] - [place], [place]   Name: DESHANTA LADY DOB: April 03, 1941 MRN: [id number] CSN: [id number] PCP: Leonel Ramsay, MD  Arrival date and time:  11/07/19 1241  Chief Complaint:  Lytle Michaels (DOI 11/06/19), Wrist Injury, and Shoulder Pain   NOTE: Prior to seeing the patient today, I have reviewed the triage nursing documentation and vital signs. Clinical staff has updated patient's PMH/PSHx, current medication list, and drug allergies/intolerances to ensure comprehensive history available to assist in medical decision making.   History:   HPI: Karla Roman is a 79 y.o. female who presents today with complaints of pain to her left shoulder and left wrist after a fall.  Patient states she was walking to her dining room she tripped over.  She is and she fell towards her [place] cabinet.  She used her left hand to catch her fall and had immediate pain afterwards.  She states she did not hit her head with the fall.  She is also complaining of some left shoulder pain after the fall.  She presents to our clinic directly after the fall.  She has not taken any OTC medication for the pain.  She has a previous fracture to the left shoulder, but no previous injuries to that left wrist.  She is unable to move her wrist passively.   Past Medical History:  Diagnosis Date  . Allergic rhinitis   . Allergy   . Anemia    in childhood  . Arthritis   . Asthma   . Atrophic vaginitis   . Cataract   . Diverticulitis   . Diverticulosis   . Duodenal ulcer   . Dyspareunia   . Dysplastic nevus 09/2012, 10/2012   right thigh, severe atypia, resected 10/2012 by Dr Jarome Matin  . Esophagitis    grade 1  . Eustachian tube dysfunction   . GERD (gastroesophageal reflux disease)   . Hiatal hernia   . Hx of adenomatous colonic polyps 02/2009   Tubular adenoma in sigmoid. Diminutive.   . Hyperlipemia   . MVP (mitral valve prolapse)   . Osteopenia   . Schatzki's ring 1995  . Sleep apnea 10/2004    Sleep study by Dr Gwenette Greet. does not need cpap  . Vertigo     Past Surgical History:  Procedure Laterality Date  . ABDOMINAL HYSTERECTOMY  2001   with anterior and posterior vaginal repair  . APPENDECTOMY  1989   ruptured  . BREAST BIOPSY Right 1969   benign  . CERVICAL DISCECTOMY  01/2004   C3-4, C5-6, C6-7  . depuytren's contraction Right 04/2006  . dysplastic nevus  10/2012   right thigh  . ESOPHAGOGASTRODUODENOSCOPY N/A 06/04/2013   Procedure: ESOPHAGOGASTRODUODENOSCOPY (EGD);  Surgeon: Lafayette Dragon, MD;  Location: [facility] ENDOSCOPY;  Service: Endoscopy;  Laterality: N/A;  . LAPAROSCOPY  04/2000   with lysis of adhesions  . right shoulder fracture Right 03/2013   non-displaced, humeral head, non-operative mgt but she developed frozen shoulder.   . SEPTOPLASTY  [id number]  . SHOULDER SURGERY Left 01/2004   release of adhesive capsulitis  . TONSILLECTOMY  1947    Family History  Problem Relation Age of Onset  . Heart attack Father        after years of angina  . Coronary artery disease Father   . Coronary artery disease Mother   . Breast cancer Maternal Aunt   . Cancer Maternal Aunt        breast, colon, ovarian  .  Diabetes Brother   . Coronary artery disease Brother   . Hypertension Sister   . Hyperthyroidism Sister   . Irritable bowel syndrome Sister   . Coronary artery disease Brother        PCI-stents  . Diabetes Brother   . Hypertension Brother   . Colon cancer Neg Hx     Social History   Tobacco Use  . Smoking status: Never Smoker  . Smokeless tobacco: Never Used  Vaping Use  . Vaping Use: Never used  Substance Use Topics  . Alcohol use: No    Alcohol/week: 0.0 standard drinks  . Drug use: No    Patient Active Problem List   Diagnosis Date Noted  . Need for influenza vaccination 01/13/2019  . Need for shingles vaccine 01/13/2019  . Balding 10/29/2018  . Chronic renal disease, stage 3, moderately decreased glomerular filtration rate (GFR) between 30-59  mL/min/1.73 square meter 10/29/2018  . Primary osteoarthritis involving multiple joints 01/08/2018  . CAD (coronary artery disease) 09/10/2016  . 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 Asthma, mild intermittent 09/05/2015  . IBS (irritable bowel syndrome) 08/03/2015  . Spinal stenosis of cervical region 10/11/2014  . Hyperlipidemia with target LDL less than 130 12/27/2013  . Duodenal ulcer, with obstruction 06/28/2013  . Renal cyst, right 06/16/2013  . Routine general medical examination at a health care facility 12/15/2010  . MITRAL VALVE PROLAPSE 12/29/2006  . Allergic rhinitis 12/29/2006  . GERD 12/29/2006    Home Medications:    Current Meds  Medication Sig  . acetaminophen (TYLENOL) 500 MG tablet Take 500-1,000 mg by mouth every 6 (six) hours as needed for mild pain or headache.  Marland Kitchen aspirin EC 81 MG tablet Take 1 tablet (81 mg total) by mouth daily.  Marland Kitchen atorvastatin (LIPITOR) 20 MG tablet TAKE 1 TABLET BY MOUTH  DAILY  . Calcium Carbonate (CALTRATE 600) 1500 MG TABS Take 1 tablet by mouth 2 (two) times daily. chewables  . Cholecalciferol (VITAMIN D-3 PO) Take 400 Units by mouth daily.  . citalopram (CELEXA) 10 MG tablet Take 10 mg by mouth daily.  . cyanocobalamin 500 MCG tablet Take 500 mcg by mouth every morning.  . pantoprazole (PROTONIX) 40 MG tablet TAKE 1 TABLET BY MOUTH  DAILY BEFORE BREAKFAST    Allergies:   Codeine, Levaquin [levofloxacin], and Oxycodone  Review of Systems (ROS): Review of Systems  Musculoskeletal: Positive for arthralgias, joint swelling and myalgias.  Neurological: Negative for headaches.  All other systems reviewed and are negative.    Vital Signs: Today's Vitals   11/07/19 1312 11/07/19 1313 11/07/19 1314 11/07/19 1431  BP:  (!) 142/63    Pulse:  63    Resp:  18    Temp:  98.2 F (36.8 C)    TempSrc:  Oral    SpO2:  100%    Weight:   115 lb (52.2 kg)   Height:   5\' 2"  (1.575 m)   PainSc: 6    6     Physical Exam: Physical  Exam   Urgent Care Treatments / Results:   LABS: PLEASE NOTE: all labs that were ordered this encounter are listed, however only abnormal results are displayed. Labs Reviewed - No data to display  EKG: -None  RADIOLOGY: DG Wrist Complete Left  Result Date: 11/07/2019 CLINICAL DATA:  Post fall, now with left wrist pain. EXAM: LEFT WRIST - COMPLETE 3+ VIEW COMPARISON:  Left wrist radiographs-05/08/2015 FINDINGS: There is an acute comminuted fracture involving the distal  metaphysis of the radius with suspected extension to the radial carpal joint (best seen on the provided AP radiograph), with foreshortening and angulation, apex palmar. Note is also made of a minimally displaced ulnar styloid process fracture. Expected adjacent soft tissue swelling.  No radiopaque foreign body. No additional fractures identified. Moderate degenerative change involving the STT joints of the base of the thumb with joint space loss, subchondral sclerosis and osteophytosis. Remaining joint spaces appear preserved. No evidence of chondrocalcinosis. No erosions. IMPRESSION: 1. Acute, comminuted, displaced fracture of the distal aspect of the radius with extension radiocarpal joint. 2. Acute minimally displaced ulnar styloid process fracture. Electronically Signed   By: Sandi Mariscal M.D.   On: 11/07/2019 13:40   DG Shoulder Left  Result Date: 11/07/2019 CLINICAL DATA:  Post fall, now with left shoulder and wrist pain. EXAM: LEFT SHOULDER - 2+ VIEW COMPARISON:  Left wrist radiographs-05/08/2015 FINDINGS: No fracture or dislocation. Mild degenerative change the left glenohumeral joint is suspected with joint space loss, articular surface irregularity inferiorly directed osteophytosis. Acromioclavicular joint spaces appear preserved. Tiny ossicles adjacent to the left greater tuberosity may represent the sequela of previous avulsive injury. No evidence of calcific tendinitis. Limited visualization of the adjacent thorax is  normal. Post lower cervical ACDF, incompletely evaluated. IMPRESSION: 1. No acute findings. 2. Mild degenerative change of the left glenohumeral joint. 3. Ossicles adjacent to the left greater tuberosity likely represents sequela of remote avulsive injury. Electronically Signed   By: Sandi Mariscal M.D.   On: 11/07/2019 13:37    PROCEDURES: Procedures  MEDICATIONS RECEIVED THIS VISIT: Medications - No data to display  PERTINENT CLINICAL COURSE NOTES/UPDATES:   Initial Impression / Assessment and Plan / Urgent Care Course:  Pertinent labs & imaging results that were available during my care of the patient were personally reviewed by me and considered in my medical decision making (see lab/imaging section of note for values and interpretations).  Karla Roman is a 79 y.o. female who presents to [facility] today with complaints of pain to left wrist and shoulder after a fall, diagnosed with fractures of distal radius and ulna, consistent with a Weld injury, and treated as such with the directions below. NP and patient reviewed discharge instructions below during visit.   Patient is well appearing overall in clinic today. She does not appear to be in any acute distress. Presenting symptoms (see HPI) and exam as documented above.   I have reviewed the follow up and strict return precautions for any new or worsening symptoms. Patient is aware of symptoms that would be deemed urgent/emergent, and would thus require further evaluation either here or in the emergency department. At the time of discharge, she verbalized understanding and consent with the discharge plan as it was reviewed with her. All questions were fielded by provider and/or clinic staff prior to patient discharge.    Final Clinical Impressions / Urgent Care Diagnoses:   Final diagnoses:  Closed fracture of distal end of left radius, unspecified fracture morphology, initial encounter  Closed displaced fracture of styloid  process of left ulna, initial encounter    New Prescriptions:  [place] Controlled Substance Registry consulted? Not Applicable  No orders of the defined types were placed in this encounter.     Discharge Instructions     You were seen for a fall and are being treated for a fracture of your wrist.   Call orthopedics office as soon as possible for an appointment.  Tell them you  have a "confirmed fracture".  You are being placed in a splint.  The splint is not to be removed until evaluated by orthopedics.  Treat your pain with Tylenol as needed.  Take care, Dr. Marland Kitchen, NP-c     Recommended Follow up Care:  Patient encouraged to follow up with the following provider within the specified time frame, or sooner as dictated by the severity of her symptoms. As always, she was instructed that for any urgent/emergent care needs, she should seek care either here or in the emergency department for more immediate evaluation.   Gertie Baron, DNP, NP-c    Gertie Baron, NP 11/07/19 [phone]

## 2019-11-08 ENCOUNTER — Other Ambulatory Visit: Payer: Self-pay | Admitting: Orthopedic Surgery

## 2019-11-09 ENCOUNTER — Other Ambulatory Visit: Payer: Self-pay

## 2019-11-09 ENCOUNTER — Encounter
Admission: RE | Admit: 2019-11-09 | Discharge: 2019-11-09 | Disposition: A | Discharge status: Home / Self Care | Payer: Medicare Other | Source: Ambulatory Visit | Attending: Orthopedic Surgery | Admitting: Orthopedic Surgery

## 2019-11-09 ENCOUNTER — Encounter: Payer: Self-pay | Admitting: Orthopedic Surgery

## 2019-11-09 NOTE — Patient Instructions (Addendum)
Your procedure is scheduled on: 11/11/19 Report to [place]. To find out your arrival time please call [phone] between 1PM - 3PM on 11/10/19.  Remember: Instructions that are not followed completely may result in serious medical risk, up to and including death, or upon the discretion of your surgeon and anesthesiologist your surgery may need to be rescheduled.     _X__ 1. Do not eat food after midnight the night before your procedure.                 No gum chewing or hard candies. You may drink clear liquids up to 2 hours                 before you are scheduled to arrive for your surgery- DO not drink clear                 liquids within 2 hours of the start of your surgery.                 Clear Liquids include:  water, apple juice without pulp, clear carbohydrate                 drink such as Clearfast or Gatorade, Black Coffee or Tea (Do not add                 anything to coffee or tea). Diabetics water only  __X__2.  On the morning of surgery brush your teeth with toothpaste and water, you                 may rinse your mouth with mouthwash if you wish.  Do not swallow any              toothpaste of mouthwash.     _X__ 3.  No Alcohol for 24 hours before or after surgery.   _X__ 4.  Do Not Smoke or use e-cigarettes For 24 Hours Prior to Your Surgery.                 Do not use any chewable tobacco products for at least 6 hours prior to                 surgery.  ____  5.  Bring all medications with you on the day of surgery if instructed.   __X__  6.  Notify your doctor if there is any change in your medical condition      (cold, fever, infections).     Do not wear jewelry, make-up, hairpins, clips or nail polish. Do not wear lotions, powders, or perfumes.  Do not shave 48 hours prior to surgery. Men may shave face and neck. Do not bring valuables to the hospital.    [facility] is not responsible for any belongings or  valuables.  Contacts, dentures/partials or body piercings may not be worn into surgery. Bring a case for your contacts, glasses or hearing aids, a denture cup will be supplied. Leave your suitcase in the car. After surgery it may be brought to your room. For patients admitted to the hospital, discharge time is determined by your treatment team.   Patients discharged the day of surgery will not be allowed to drive home.   Please read over the following fact sheets that you were given:   MRSA Information  __X__ Take these medicines the morning of surgery with A SIP OF WATER:  1. citalopram (CELEXA) 10 MG tablet  2. finasteride (PROPECIA) 1 MG tablet  3. pantoprazole (PROTONIX) 40 MG tablet  4.  5.  6.  ____ Fleet Enema (as directed)   __X__ Use CHG Soap/SAGE wipes as directed  ____ Use inhalers on the day of surgery  ____ Stop metformin/Janumet/Farxiga 2 days prior to surgery    ____ Take 1/2 of usual insulin dose the night before surgery. No insulin the morning          of surgery.   ____ Stop Blood Thinners Coumadin/Plavix/Xarelto/Pleta/Pradaxa/Eliquis/Effient/Aspirin  on   Or contact your Surgeon, Cardiologist or Medical Doctor regarding  ability to stop your blood thinners  __X__ Stop Anti-inflammatories 7 days before surgery such as Advil, Ibuprofen, Motrin,  BC or Goodies Powder, Naprosyn, Naproxen, Aleve, Aspirin    __X__ Stop all herbal supplements, fish oil or vitamin E until after surgery.    ____ Bring C-Pap to the hospital.    [place], VITAMIN E, GLUCOSAMINE TODAY 11/09/19  FINISH ENSURE PRE SURGERY DRINK 2 HOURS BEFORE YOUR SCHEDULED TO ARRIVE UNLESS Y OU CANNOT TOLERATE LIQUID OR YOU ARE AN EARLY ARRIVAL

## 2019-11-10 ENCOUNTER — Other Ambulatory Visit: Admission: RE | Admit: 2019-11-10 | Payer: Medicare Other | Source: Ambulatory Visit

## 2019-11-10 ENCOUNTER — Other Ambulatory Visit
Admission: RE | Admit: 2019-11-10 | Discharge: 2019-11-10 | Disposition: A | Discharge status: Home / Self Care | Payer: Medicare Other | Source: Ambulatory Visit | Attending: Orthopedic Surgery | Admitting: Orthopedic Surgery

## 2019-11-10 DIAGNOSIS — Z20822 Contact with and (suspected) exposure to covid-19: Secondary | ICD-10-CM | POA: Diagnosis not present

## 2019-11-10 DIAGNOSIS — Z01812 Encounter for preprocedural laboratory examination: Secondary | ICD-10-CM | POA: Diagnosis present

## 2019-11-10 LAB — SARS CORONAVIRUS 2 (TAT 6-24 HRS): SARS Coronavirus 2: NEGATIVE

## 2019-11-10 NOTE — H&P (Signed)
Chief Complaint  Patient presents with   Left Wrist - Pain   Reason for Visit The patient is an 79 year old female that presented for complaints of her left wrist. She states that yesterday she tripped and fell on an outstretched left wrist. She went to the [facility] health urgent care and had x-rays of the left wrist and left shoulder. Her shoulder is doing better. She is still having a lot of pain in her wrist. She was treated for a left wrist fracture with a sugar tong splint. She still has some pain in the wrist but is doing okay. She has not had an injury to this wrist before. There is no numbness or tingling. The patient is not a diabetic.  Medications Current Outpatient Medications  Medication Sig Dispense Refill   acetaminophen (TYLENOL) 500 MG tablet Take 500 mg by mouth once daily as needed   ascorbic acid (VITAMIN C ORAL) Take 1 tablet by mouth once daily   aspirin 81 MG EC tablet Take 81 mg by mouth once daily   atorvastatin (LIPITOR) 20 MG tablet Take 20 mg by mouth nightly   biotin 5,000 mcg TbDL Take 5,000 mcg by mouth once daily   calcium carbonate 600 mg calcium (1,500 mg) Tab tablet Take 1 tablet by mouth once daily   citalopram (CELEXA) 10 MG tablet Take 1 tablet (10 mg total) by mouth once daily 90 tablet 1   glucosamin,chondroit-mvit-min3 (GLUCOSAMINE-CHONDROIT-MV-MIN3 ORAL) Take 1 tablet by mouth 2 (two) times daily   multivitamin tablet Take 1 tablet by mouth once daily   omega-3 acid ethyl esters (LOVAZA) 1 gram capsule Take 1 g by mouth once daily   pantoprazole (PROTONIX) 40 MG DR tablet Take 40 mg by mouth once daily   Saccharomyces boulardii (FLORASTOR) 250 mg capsule Take 250 mg by mouth once daily   vitamin E 400 UNIT capsule Take 400 Units by mouth once daily   amoxicillin (AMOXIL) 400 mg/5 mL suspension Take 25 mLs by mouth once as needed Before dental procedures (Patient not taking: Reported on 11/08/2019 )   cholecalciferol, vitamin D3, (VITAMIN  D3 ORAL) Take 1 capsule by mouth once daily (Patient not taking: Reported on 11/08/2019 )   finasteride (PROPECIA) 1 mg tablet Take 1 mg by mouth once daily (Patient not taking: Reported on 11/08/2019 )   minoxidiL (ROGAINE) 2 % external solution Apply topically once daily (Patient not taking: Reported on 11/08/2019 )   No current facility-administered medications for this visit.   Allergies Levofloxacin, Codeine, and Oxycodone  Histories Past Medical History: Past Medical History:  Diagnosis Date   Allergy   Anemia   Anxiety   Asthma, unspecified asthma severity, unspecified whether complicated, unspecified whether persistent   CAD (coronary artery disease)   Cervical spinal stenosis   Chronic kidney disease   Cystitis   Depression   GERD (gastroesophageal reflux disease)   Hyperlipidemia   Mitral valve prolapse   Osteoarthritis   Osteopenia   Past Surgical History: Past Surgical History:  Procedure Laterality Date   ARTHROPLASTY TOTAL SHOULDER Right 2016   CERVICAL DISC ARTHROPLASTY; ANTERIOR APPROACH 2016   HYSTERECTOMY   RHINOPLASTY   Social History: Social History   Socioeconomic History   Marital status: Married  Spouse name: Hollan Philipp   Number of children: 2   Years of education: 12   Highest education level: High school graduate  Occupational History   Occupation: Retired Clinical cytogeneticist  Tobacco Use   Smoking status: Never Smoker  Smokeless tobacco: Never Used  Vaping Use   Vaping Use: Never used  Substance and Sexual Activity   Alcohol use: Never   Drug use: Never   Sexual activity: Yes  Partners: Male  Birth control/protection: Surgical  Other Topics Concern   Not on file  Social History Narrative   Not on file   Social Determinants of Health   Financial Resource Strain:   Difficulty of Paying Living Expenses:  Food Insecurity:   Worried About Charity fundraiser in the Last Year:   Arboriculturist in  the Last Year:  Transportation Needs:   Film/video editor (Medical):   Lack of Transportation (Non-Medical):  Physical Activity:   Days of Exercise per Week:   Minutes of Exercise per Session:  Stress:   Feeling of Stress :  Social Connections:   Frequency of Communication with Friends and Family:   Frequency of Social Gatherings with Friends and Family:   Attends Religious Services:   Active Member of Clubs or Organizations:   Attends Music therapist:   Marital Status:   Family History: Family History  Problem Relation Age of Onset   Heart disease Mother   Myocardial Infarction (Heart attack) Father   Fibromyalgia Sister   Heart disease Brother   Diabetes type II Brother   Myocardial Infarction (Heart attack) Brother   Alzheimer's disease Sister   Review of Systems A comprehensive 14 point ROS was performed, reviewed, and the pertinent orthopaedic findings are documented in the HPI.  Exam BP 144/64   Pulse 69   Ht 157.5 cm (5\' 2" )   Wt 54.9 kg (121 lb)   BMI 22.13 kg/m   General/Constitutional: NAD, conversant Eyes: Pupils equal and round, extraocular movements intact ENT: atraumatic external nose and ears, moist mucous membranes Respiratory: non-labored breathing, symmetric chest rise, chest sounds clear. Cardiovascular: no visible lower extremity edema, peripheral pulses present, regular rate and rhythm  Skin: normal skin turgor, warm and dry Neurological: cranial nerves grossly intact, sensation grossly intact Psychological: Appropriate mood and affect; appropriate judgment Musculoskeletal: as detailed below:  General: Well developed, well nourished 79 y.o. female in no apparent distress. Normal affect. Normal communication. Patient answers questions appropriately. The patient has a normal gait. There is no antalgic component. There is no hip lurch.   Heart: Examination of the heart reveals regular, rate, and rhythm. There is  no murmur noted on ascultation. There is a normal apical pulse.  Lungs: Lungs are clear to auscultation. There is no wheeze, rhonchi, or crackles. There is normal expansion of bilateral chest walls.   Left upper extremity: Examination left wrist and arm reveals the sugar tong splint in good position. She is tender to palpation around the wrist. She has good grip strength with her fingertips. She has neurovascular that is intact. She has limited elbow rotation with good flexion and extension in the sugar tong splint. She has a benign shoulder exam.  Xrays: Xrays were reviewed of the left wrist that were taken at [place] urgent care on November 07, 2019. The xrays showed a distal radial impacted and angulated fracture with shortening. There is an ulnar styloid nondisplaced fracture.  Impression Encounter Diagnosis  Name Primary?   Closed traumatic displaced fracture of distal end of left radius, initial encounter Yes   Plan  1. I discussed the physical exam finding as well as previous treatment and her situation with the wrist. I discussed the situation with Dr. Rudene Christians and surgical  options. We discussed surgical options with the patient and her daughter. 2. The patient was kept in her sugar tong splint at this time. 3. The patient will continue her current pain medication if needed. 4. The patient be set up for a left distal radial ORIF with volar plating to be done by Dr. Rudene Christians on November 11, 2019. I reviewed the risk, benefits, and alternatives to the surgery. The patient seems understand and wants to set up. 5. The patient will follow up after surgery for postoperative care.  This note will serve as the history and physical for surgery scheduled on November 11, 2019 for a left distal radial ORIF with volar plating to be done by Dr. Rudene Christians.   This note was generated in part with voice recognition software and I apologize for any typographical errors that were not detected and  corrected.  Parks Ranger, M.S.     Electronically signed by Scarlett Presto, PA at 11/08/2019 1:50 PM EDT  Reviewed paper H+P, will be scanned into chart. No changes noted.

## 2019-11-11 ENCOUNTER — Ambulatory Visit: Payer: Medicare Other

## 2019-11-11 ENCOUNTER — Encounter
Admission: RE | Disposition: A | Discharge status: Home / Self Care | Payer: Self-pay | Source: Home / Self Care | Attending: Orthopedic Surgery

## 2019-11-11 ENCOUNTER — Ambulatory Visit: Payer: Medicare Other | Admitting: Certified Registered Nurse Anesthetist

## 2019-11-11 ENCOUNTER — Other Ambulatory Visit: Payer: Self-pay

## 2019-11-11 ENCOUNTER — Encounter: Payer: Self-pay | Admitting: Orthopedic Surgery

## 2019-11-11 ENCOUNTER — Ambulatory Visit
Admission: RE | Admit: 2019-11-11 | Discharge: 2019-11-11 | Disposition: A | Discharge status: Home / Self Care | Payer: Medicare Other | Attending: Orthopedic Surgery | Admitting: Orthopedic Surgery

## 2019-11-11 DIAGNOSIS — N189 Chronic kidney disease, unspecified: Secondary | ICD-10-CM | POA: Diagnosis not present

## 2019-11-11 DIAGNOSIS — F329 Major depressive disorder, single episode, unspecified: Secondary | ICD-10-CM | POA: Diagnosis not present

## 2019-11-11 DIAGNOSIS — Z8249 Family history of ischemic heart disease and other diseases of the circulatory system: Secondary | ICD-10-CM | POA: Insufficient documentation

## 2019-11-11 DIAGNOSIS — S52552A Other extraarticular fracture of lower end of left radius, initial encounter for closed fracture: Secondary | ICD-10-CM | POA: Diagnosis not present

## 2019-11-11 DIAGNOSIS — E785 Hyperlipidemia, unspecified: Secondary | ICD-10-CM | POA: Insufficient documentation

## 2019-11-11 DIAGNOSIS — Z8781 Personal history of (healed) traumatic fracture: Secondary | ICD-10-CM

## 2019-11-11 DIAGNOSIS — Z885 Allergy status to narcotic agent status: Secondary | ICD-10-CM | POA: Insufficient documentation

## 2019-11-11 DIAGNOSIS — W010XXA Fall on same level from slipping, tripping and stumbling without subsequent striking against object, initial encounter: Secondary | ICD-10-CM | POA: Insufficient documentation

## 2019-11-11 DIAGNOSIS — I341 Nonrheumatic mitral (valve) prolapse: Secondary | ICD-10-CM | POA: Insufficient documentation

## 2019-11-11 DIAGNOSIS — M1990 Unspecified osteoarthritis, unspecified site: Secondary | ICD-10-CM | POA: Diagnosis not present

## 2019-11-11 DIAGNOSIS — K219 Gastro-esophageal reflux disease without esophagitis: Secondary | ICD-10-CM | POA: Insufficient documentation

## 2019-11-11 DIAGNOSIS — Z79899 Other long term (current) drug therapy: Secondary | ICD-10-CM | POA: Insufficient documentation

## 2019-11-11 DIAGNOSIS — Z881 Allergy status to other antibiotic agents status: Secondary | ICD-10-CM | POA: Diagnosis not present

## 2019-11-11 DIAGNOSIS — I2510 Atherosclerotic heart disease of native coronary artery without angina pectoris: Secondary | ICD-10-CM | POA: Insufficient documentation

## 2019-11-11 DIAGNOSIS — Z7982 Long term (current) use of aspirin: Secondary | ICD-10-CM | POA: Insufficient documentation

## 2019-11-11 DIAGNOSIS — Z9889 Other specified postprocedural states: Secondary | ICD-10-CM

## 2019-11-11 DIAGNOSIS — J45909 Unspecified asthma, uncomplicated: Secondary | ICD-10-CM | POA: Diagnosis not present

## 2019-11-11 DIAGNOSIS — M8580 Other specified disorders of bone density and structure, unspecified site: Secondary | ICD-10-CM | POA: Insufficient documentation

## 2019-11-11 HISTORY — PX: OPEN REDUCTION INTERNAL FIXATION (ORIF) DISTAL RADIAL FRACTURE: SHX5989

## 2019-11-11 SURGERY — OPEN REDUCTION INTERNAL FIXATION (ORIF) DISTAL RADIUS FRACTURE
Anesthesia: General | Site: Wrist | Laterality: Left

## 2019-11-11 MED ORDER — CHLORHEXIDINE GLUCONATE 0.12 % MT SOLN
OROMUCOSAL | Status: AC
  Administered 2019-11-11: 15 mL via OROMUCOSAL
  Filled 2019-11-11: qty 15

## 2019-11-11 MED ORDER — ORAL CARE MOUTH RINSE: 15.0000 mL | Freq: Once | OROMUCOSAL | Status: AC

## 2019-11-11 MED ORDER — ONDANSETRON HCL 4 MG/2ML IJ SOLN: 4.0000 mg | Freq: Four times a day (QID) | INTRAMUSCULAR | Status: DC | PRN

## 2019-11-11 MED ORDER — KETOROLAC TROMETHAMINE 15 MG/ML IJ SOLN
15.0000 mg | Freq: Once | INTRAMUSCULAR | Status: AC
  Administered 2019-11-11: 15 mg via INTRAVENOUS

## 2019-11-11 MED ORDER — CEFAZOLIN SODIUM-DEXTROSE 1-4 GM/50ML-% IV SOLN
INTRAVENOUS | Status: AC
  Filled 2019-11-11: qty 50

## 2019-11-11 MED ORDER — HYDROCODONE-ACETAMINOPHEN 5-325 MG PO TABS
1.0000 | ORAL_TABLET | Freq: Once | ORAL | Status: AC
  Administered 2019-11-11: 1 via ORAL

## 2019-11-11 MED ORDER — NEOMYCIN-POLYMYXIN B GU 40-200000 IR SOLN
Status: DC | PRN
  Administered 2019-11-11: 2 mL

## 2019-11-11 MED ORDER — KETOROLAC TROMETHAMINE 15 MG/ML IJ SOLN
INTRAMUSCULAR | Status: AC
  Filled 2019-11-11: qty 1

## 2019-11-11 MED ORDER — SODIUM CHLORIDE 0.9 % IV SOLN: INTRAVENOUS | Status: DC

## 2019-11-11 MED ORDER — EPHEDRINE 5 MG/ML INJ
INTRAVENOUS | Status: AC
  Filled 2019-11-11: qty 10

## 2019-11-11 MED ORDER — EPHEDRINE SULFATE 50 MG/ML IJ SOLN
INTRAMUSCULAR | Status: DC | PRN
  Administered 2019-11-11 (×2): 10 mg via INTRAVENOUS

## 2019-11-11 MED ORDER — NEOMYCIN-POLYMYXIN B GU 40-200000 IR SOLN
Status: AC
  Filled 2019-11-11: qty 2

## 2019-11-11 MED ORDER — HYDROCODONE-ACETAMINOPHEN 5-325 MG PO TABS: 1.0000 | ORAL_TABLET | Freq: Four times a day (QID) | ORAL | 0 refills | Status: DC | PRN

## 2019-11-11 MED ORDER — DEXAMETHASONE SODIUM PHOSPHATE 10 MG/ML IJ SOLN
INTRAMUSCULAR | Status: DC | PRN
  Administered 2019-11-11: 5 mg via INTRAVENOUS

## 2019-11-11 MED ORDER — FAMOTIDINE 20 MG PO TABS
ORAL_TABLET | ORAL | Status: AC
  Filled 2019-11-11: qty 1

## 2019-11-11 MED ORDER — FENTANYL CITRATE (PF) 100 MCG/2ML IJ SOLN
INTRAMUSCULAR | Status: AC
  Filled 2019-11-11: qty 2

## 2019-11-11 MED ORDER — ONDANSETRON HCL 4 MG PO TABS: 4.0000 mg | ORAL_TABLET | Freq: Four times a day (QID) | ORAL | Status: DC | PRN

## 2019-11-11 MED ORDER — LIDOCAINE HCL (CARDIAC) PF 100 MG/5ML IV SOSY
PREFILLED_SYRINGE | INTRAVENOUS | Status: DC | PRN
  Administered 2019-11-11: 60 mg via INTRAVENOUS

## 2019-11-11 MED ORDER — ONDANSETRON HCL 4 MG/2ML IJ SOLN
INTRAMUSCULAR | Status: DC | PRN
  Administered 2019-11-11: 4 mg via INTRAVENOUS

## 2019-11-11 MED ORDER — CHLORHEXIDINE GLUCONATE 0.12 % MT SOLN: 15.0000 mL | Freq: Once | OROMUCOSAL | Status: AC

## 2019-11-11 MED ORDER — PROPOFOL 10 MG/ML IV BOLUS
INTRAVENOUS | Status: AC
  Filled 2019-11-11: qty 60

## 2019-11-11 MED ORDER — HYDROCODONE-ACETAMINOPHEN 5-325 MG PO TABS
ORAL_TABLET | ORAL | Status: AC
  Filled 2019-11-11: qty 1

## 2019-11-11 MED ORDER — ACETAMINOPHEN 10 MG/ML IV SOLN
INTRAVENOUS | Status: AC
  Filled 2019-11-11: qty 100

## 2019-11-11 MED ORDER — FENTANYL CITRATE (PF) 100 MCG/2ML IJ SOLN
25.0000 ug | INTRAMUSCULAR | Status: DC | PRN
  Administered 2019-11-11 (×4): 25 ug via INTRAVENOUS
  Administered 2019-11-11: 50 ug via INTRAVENOUS

## 2019-11-11 MED ORDER — ACETAMINOPHEN 10 MG/ML IV SOLN
INTRAVENOUS | Status: DC | PRN
  Administered 2019-11-11: 800 mg via INTRAVENOUS

## 2019-11-11 MED ORDER — LACTATED RINGERS IV SOLN: INTRAVENOUS | Status: DC

## 2019-11-11 MED ORDER — CEFAZOLIN SODIUM-DEXTROSE 1-4 GM/50ML-% IV SOLN
1.0000 g | INTRAVENOUS | Status: AC
  Administered 2019-11-11: 1 g via INTRAVENOUS

## 2019-11-11 MED ORDER — FENTANYL CITRATE (PF) 100 MCG/2ML IJ SOLN
INTRAMUSCULAR | Status: DC | PRN
  Administered 2019-11-11 (×2): 25 ug via INTRAVENOUS

## 2019-11-11 MED ORDER — METOCLOPRAMIDE HCL 10 MG PO TABS: 5.0000 mg | ORAL_TABLET | Freq: Three times a day (TID) | ORAL | Status: DC | PRN

## 2019-11-11 MED ORDER — METOCLOPRAMIDE HCL 5 MG/ML IJ SOLN: 5.0000 mg | Freq: Three times a day (TID) | INTRAMUSCULAR | Status: DC | PRN

## 2019-11-11 MED ORDER — PROPOFOL 10 MG/ML IV BOLUS
INTRAVENOUS | Status: DC | PRN
  Administered 2019-11-11: 100 mg via INTRAVENOUS

## 2019-11-11 SURGICAL SUPPLY — 40 items
BIT DRILL 110X2.5XQCK CNCT (BIT) ×1 IMPLANT
BIT DRILL 2 FAST STEP (BIT) ×3 IMPLANT
BIT DRILL 2.5 (BIT) ×3
BIT DRL 110X2.5XQCK CNCT (BIT) ×1
BNDG ELASTIC 4X5.8 VLCR STR LF (GAUZE/BANDAGES/DRESSINGS) ×3 IMPLANT
CANISTER SUCT 1200ML W/VALVE (MISCELLANEOUS) ×3 IMPLANT
CHLORAPREP W/TINT 26 (MISCELLANEOUS) ×3 IMPLANT
COVER WAND RF STERILE (DRAPES) ×3 IMPLANT
CUFF TOURN SGL QUICK 18X4 (TOURNIQUET CUFF) ×3 IMPLANT
DRAPE FLUOR MINI C-ARM 54X84 (DRAPES) ×3 IMPLANT
ELECT REM PT RETURN 9FT ADLT (ELECTROSURGICAL) ×3
ELECTRODE REM PT RTRN 9FT ADLT (ELECTROSURGICAL) ×1 IMPLANT
GAUZE SPONGE 4X4 12PLY STRL (GAUZE/BANDAGES/DRESSINGS) ×3 IMPLANT
GAUZE XEROFORM 1X8 LF (GAUZE/BANDAGES/DRESSINGS) ×6 IMPLANT
GLOVE SURG SYN 9.0  PF PI (GLOVE) ×3
GLOVE SURG SYN 9.0 PF PI (GLOVE) ×1 IMPLANT
GOWN SRG 2XL LVL 4 RGLN SLV (GOWNS) ×1 IMPLANT
GOWN STRL NON-REIN 2XL LVL4 (GOWNS) ×3
GOWN STRL REUS W/ TWL LRG LVL3 (GOWN DISPOSABLE) ×1 IMPLANT
GOWN STRL REUS W/TWL LRG LVL3 (GOWN DISPOSABLE) ×3
KIT TURNOVER KIT A (KITS) ×3 IMPLANT
NEEDLE FILTER BLUNT 18X 1/2SAF (NEEDLE) ×2
NEEDLE FILTER BLUNT 18X1 1/2 (NEEDLE) ×1 IMPLANT
NS IRRIG 500ML POUR BTL (IV SOLUTION) ×3 IMPLANT
PACK EXTREMITY (MISCELLANEOUS) ×3 IMPLANT
PAD CAST CTTN 4X4 STRL (SOFTGOODS) ×2 IMPLANT
PADDING CAST COTTON 4X4 STRL (SOFTGOODS) ×6
PEG SUBCHONDRAL SMOOTH 2.0X14 (Peg) ×3 IMPLANT
PEG SUBCHONDRAL SMOOTH 2.0X16 (Peg) ×6 IMPLANT
PEG SUBCHONDRAL SMOOTH 2.0X18 (Peg) ×6 IMPLANT
PEG SUBCHONDRAL SMOOTH 2.0X20 (Peg) ×3 IMPLANT
PLATE SHORT 21.6X48.9 NRRW LT (Plate) ×3 IMPLANT
SCALPEL PROTECTED #15 DISP (BLADE) ×6 IMPLANT
SCREW CORT 3.5X10 LNG (Screw) ×9 IMPLANT
SPLINT CAST 1 STEP 3X12 (MISCELLANEOUS) ×3 IMPLANT
SUT ETHILON 4-0 (SUTURE) ×3
SUT ETHILON 4-0 FS2 18XMFL BLK (SUTURE) ×1
SUT VICRYL 3-0 27IN (SUTURE) ×3 IMPLANT
SUTURE ETHLN 4-0 FS2 18XMF BLK (SUTURE) ×1 IMPLANT
SYR 3ML LL SCALE MARK (SYRINGE) ×3 IMPLANT

## 2019-11-11 NOTE — Anesthesia Procedure Notes (Signed)
Procedure Name: LMA Insertion Date/Time: 11/11/2019 7:23 AM Performed by: Lowry Bowl, CRNA Pre-anesthesia Checklist: Patient identified, Emergency Drugs available, Patient being monitored and Suction available Patient Re-evaluated:Patient Re-evaluated prior to induction Oxygen Delivery Method: Circle system utilized Preoxygenation: Pre-oxygenation with 100% oxygen Induction Type: IV induction LMA: LMA inserted LMA Size: 3.0 Number of attempts: 1 Placement Confirmation: positive ETCO2 and breath sounds checked- equal and bilateral Tube secured with: Tape Dental Injury: Teeth and Oropharynx as per pre-operative assessment

## 2019-11-11 NOTE — OR Nursing (Signed)
Dr. Rudene Christians in to see pt in postop @ 9:46 am; spoke with family member as well.

## 2019-11-11 NOTE — Transfer of Care (Signed)
Immediate Anesthesia Transfer of Care Note  Patient: Karla Roman  Procedure(s) Performed: OPEN REDUCTION INTERNAL FIXATION (ORIF) DISTAL RADIAL FRACTURE (Left Wrist)  Patient Location: PACU  Anesthesia Type:General  Level of Consciousness: sedated and patient cooperative  Airway & Oxygen Therapy: Patient Spontanous Breathing and Patient connected to face mask oxygen  Post-op Assessment: Report given to RN and Post -op Vital signs reviewed and stable  Post vital signs: Reviewed and stable  Last Vitals:  Vitals Value Taken Time  BP 158/66 11/11/19 0813  Temp    Pulse 90 11/11/19 0813  Resp    SpO2 100 % 11/11/19 0813  Vitals shown include unvalidated device data.  Last Pain:  Vitals:   11/11/19 0614  TempSrc: Temporal  PainSc: 0-No pain         Complications: No complications documented.

## 2019-11-11 NOTE — Op Note (Signed)
11/11/2019  8:14 AM  PATIENT:  Karla Roman  79 y.o. female  PRE-OPERATIVE DIAGNOSIS:  Closed traumatic displaced fracture of distal end of left radius, initial encounter extra-articular  POST-OPERATIVE DIAGNOSIS:  Closed traumatic displaced fracture of distal end of left radius, initial encounter extra-articular  PROCEDURE:  Procedure(s): OPEN REDUCTION INTERNAL FIXATION (ORIF) DISTAL RADIAL FRACTURE (Left)  SURGEON: Laurene Footman, MD  ASSISTANTS: None  ANESTHESIA:   general  EBL:  Total I/O In: 930 [I.V.:800; IV Piggyback:130] Out: 5 [Blood:5]  BLOOD ADMINISTERED:none  DRAINS: none   LOCAL MEDICATIONS USED:  NONE  SPECIMEN:  No Specimen  DISPOSITION OF SPECIMEN:  N/A  COUNTS:  YES  TOURNIQUET:   Total Tourniquet Time Documented: Upper Arm (Left) - 19 minutes Total: Upper Arm (Left) - 19 minutes   IMPLANTS: [employer] hand innovations short narrow DVR with multiple smooth pegs and screws  DICTATION: .Dragon Dictation patient was brought to the operating room and after adequate general anesthesia was obtained the left arm was prepped and draped in the usual sterile fashion with a tourniquet applied the upper arm.  After patient identification and timeout procedures were completed a volar approach was made after raising the tourniquet with the incision centered on the FCR tendon.  The tendon sheath incised and the tendons retracted radially protecting the radial artery and associated veins.  Deep fascia was then incised and the muscles retracted ulnarly with deep retractor placed the pronator was elevated off the shaft and distal fragments to provide exposure of the fracture.  With the fingertrap traction most of the length of been restored but the dorsal displacement had not so a Soil scientist was used to bring the distal fragment more volar distal first approach was then utilized with the plate applied to the distal fragment with K wire holding in position checking on AP  and lateral imaging.  The 6 distal screw holes were then drilled measured and then smooth pegs placed when these had all been set the plate was brought to the shaft and 310 mm screws inserted.  Traction was removed and under fluoroscopic examination there is no penetration into the joint and the fracture was stable and essentially anatomic alignment.  The wound was irrigated and then tourniquet let down.  The wound was closed with 3-0 Vicryl subcutaneously and 4-0 nylon for the skin in a simple interrupted fashion.  Xeroform 4 x 4's web roll volar splint and Ace wrap applied.  PLAN OF CARE: Discharge to home after PACU  PATIENT DISPOSITION:  PACU - hemodynamically stable.

## 2019-11-11 NOTE — Anesthesia Preprocedure Evaluation (Signed)
Anesthesia Evaluation  Patient identified by MRN, date of birth, ID band Patient awake    Reviewed: Allergy & Precautions, H&P , NPO status , Patient's Chart, lab work & pertinent test results  History of Anesthesia Complications Negative for: history of anesthetic complications  Airway Mallampati: III  TM Distance: <3 FB Neck ROM: limited    Dental  (+) Chipped   Pulmonary asthma , sleep apnea ,    Pulmonary exam normal        Cardiovascular Exercise Tolerance: Good + CAD  Normal cardiovascular exam+ Valvular Problems/Murmurs MVP      Neuro/Psych negative neurological ROS  negative psych ROS   GI/Hepatic Neg liver ROS, hiatal hernia, PUD, GERD  Medicated and Controlled,  Endo/Other  negative endocrine ROS  Renal/GU Renal disease     Musculoskeletal   Abdominal   Peds  Hematology negative hematology ROS (+)   Anesthesia Other Findings Past Medical History: No date: Allergic rhinitis No date: Allergy No date: Anemia     Comment:  in childhood No date: Arthritis No date: Asthma No date: Atrophic vaginitis No date: Cataract No date: Diverticulitis No date: Diverticulosis No date: Duodenal ulcer No date: Dyspareunia 09/2012, 10/2012: Dysplastic nevus     Comment:  right thigh, severe atypia, resected 10/2012 by Dr Jarome Matin No date: Esophagitis     Comment:  grade 1 No date: Eustachian tube dysfunction No date: GERD (gastroesophageal reflux disease) No date: Hiatal hernia 02/2009: Hx of adenomatous colonic polyps     Comment:  Tubular adenoma in sigmoid. Diminutive.  No date: Hyperlipemia No date: MVP (mitral valve prolapse) No date: Osteopenia 1995: Schatzki's ring 10/2004: Sleep apnea     Comment:  Sleep study by Dr Gwenette Greet. does not need cpap No date: Vertigo  Past Surgical History: 2001: ABDOMINAL HYSTERECTOMY     Comment:  with anterior and posterior vaginal repair 1989:  APPENDECTOMY     Comment:  ruptured 1969: BREAST BIOPSY; Right     Comment:  benign 01/2004: CERVICAL DISCECTOMY     Comment:  C3-4, C5-6, C6-7 04/2006: depuytren's contraction; Right 10/2012: dysplastic nevus     Comment:  right thigh 06/04/2013: ESOPHAGOGASTRODUODENOSCOPY; N/A     Comment:  Procedure: ESOPHAGOGASTRODUODENOSCOPY (EGD);  Surgeon:               Lafayette Dragon, MD;  Location: [facility] ENDOSCOPY;  Service:               Endoscopy;  Laterality: N/A; 04/2000: LAPAROSCOPY     Comment:  with lysis of adhesions 03/2013: right shoulder fracture; Right     Comment:  non-displaced, humeral head, non-operative mgt but she               developed frozen shoulder.  1976: SEPTOPLASTY 01/2004: SHOULDER SURGERY; Left     Comment:  release of adhesive capsulitis 1947: TONSILLECTOMY  BMI    Body Mass Index: 21.03 kg/m      Reproductive/Obstetrics negative OB ROS                             Anesthesia Physical Anesthesia Plan  ASA: III  Anesthesia Plan: General LMA   Post-op Pain Management:    Induction: Intravenous  PONV Risk Score and Plan: Dexamethasone, Ondansetron, Midazolam and Treatment may vary due to age or medical condition  Airway Management Planned: LMA  Additional Equipment:   Intra-op Plan:   Post-operative Plan: Extubation in OR  Informed Consent: I have reviewed the patients History and Physical, chart, labs and discussed the procedure including the risks, benefits and alternatives for the proposed anesthesia with the patient or authorized representative who has indicated his/her understanding and acceptance.     Dental Advisory Given  Plan Discussed with: Anesthesiologist, CRNA and Surgeon  Anesthesia Plan Comments: (Patient consented for risks of anesthesia including but not limited to:  - adverse reactions to medications - damage to eyes, teeth, lips or other oral mucosa - nerve damage due to positioning  - sore throat or  hoarseness - Damage to heart, brain, nerves, lungs, other parts of body or loss of life  Patient voiced understanding.)        Anesthesia Quick Evaluation

## 2019-11-11 NOTE — Anesthesia Postprocedure Evaluation (Signed)
Anesthesia Post Note  Patient: CAMERYN SCHUM  Procedure(s) Performed: OPEN REDUCTION INTERNAL FIXATION (ORIF) DISTAL RADIAL FRACTURE (Left Wrist)  Patient location during evaluation: PACU Anesthesia Type: General Level of consciousness: awake and alert Pain management: pain level controlled Vital Signs Assessment: post-procedure vital signs reviewed and stable Respiratory status: spontaneous breathing, nonlabored ventilation, respiratory function stable and patient connected to nasal cannula oxygen Cardiovascular status: blood pressure returned to baseline and stable Postop Assessment: no apparent nausea or vomiting Anesthetic complications: no   No complications documented.   Last Vitals:  Vitals:   11/11/19 0924 11/11/19 0949  BP: (!) 145/53 (!) 139/55  Pulse: 76 84  Resp: 14   Temp: (!) 36.2 C   SpO2: 93% 95%    Last Pain:  Vitals:   11/11/19 1008  TempSrc:   PainSc: 4                  Precious Haws Loriene Taunton

## 2019-11-11 NOTE — Discharge Instructions (Addendum)
Keep arm elevated up on pillows to decrease swelling. Ice to the back of the wrist today and tomorrow may help with pain and swelling. Work on finger motion is much as you can especially bending your fingers at the knuckle to try to make a fist. Pain medicine as directed. Call office if you are having problems.  Alpha   1) The drugs that you were given will stay in your system until tomorrow so for the next 24 hours you should not:  A) Drive an automobile B) Make any legal decisions C) Drink any alcoholic beverage   2) You may resume regular meals tomorrow.  Today it is better to start with liquids and gradually work up to solid foods.  You may eat anything you prefer, but it is better to start with liquids, then soup and crackers, and gradually work up to solid foods.   3) Please notify your doctor immediately if you have any unusual bleeding, trouble breathing, redness and pain at the surgery site, drainage, fever, or pain not relieved by medication.    4) Additional Instructions:        Please contact your physician with any problems or Same Day Surgery at [phone], Monday through Friday 6 am to 4 pm, or [place] at [facility] number at [phone].

## 2019-11-12 ENCOUNTER — Encounter: Payer: Self-pay | Admitting: Orthopedic Surgery

## 2019-12-12 ENCOUNTER — Other Ambulatory Visit: Payer: Self-pay | Admitting: Internal Medicine

## 2019-12-20 ENCOUNTER — Other Ambulatory Visit: Payer: Self-pay

## 2019-12-20 ENCOUNTER — Ambulatory Visit
Admission: RE | Admit: 2019-12-20 | Discharge: 2019-12-20 | Disposition: A | Discharge status: Home / Self Care | Payer: Medicare Other | Source: Ambulatory Visit | Attending: Infectious Diseases | Admitting: Infectious Diseases

## 2019-12-20 DIAGNOSIS — Z13820 Encounter for screening for osteoporosis: Secondary | ICD-10-CM | POA: Diagnosis present

## 2019-12-20 DIAGNOSIS — Z780 Asymptomatic menopausal state: Secondary | ICD-10-CM | POA: Insufficient documentation

## 2019-12-20 DIAGNOSIS — Z1231 Encounter for screening mammogram for malignant neoplasm of breast: Secondary | ICD-10-CM

## 2019-12-20 DIAGNOSIS — M810 Age-related osteoporosis without current pathological fracture: Secondary | ICD-10-CM | POA: Insufficient documentation

## 2019-12-20 DIAGNOSIS — M069 Rheumatoid arthritis, unspecified: Secondary | ICD-10-CM | POA: Insufficient documentation

## 2019-12-30 ENCOUNTER — Other Ambulatory Visit: Payer: Self-pay | Admitting: Infectious Diseases

## 2019-12-30 DIAGNOSIS — R928 Other abnormal and inconclusive findings on diagnostic imaging of breast: Secondary | ICD-10-CM

## 2020-01-03 ENCOUNTER — Ambulatory Visit
Admission: RE | Admit: 2020-01-03 | Discharge: 2020-01-03 | Disposition: A | Discharge status: Home / Self Care | Payer: Medicare Other | Source: Ambulatory Visit | Attending: Infectious Diseases | Admitting: Infectious Diseases

## 2020-01-03 ENCOUNTER — Other Ambulatory Visit: Payer: Self-pay

## 2020-01-03 DIAGNOSIS — R928 Other abnormal and inconclusive findings on diagnostic imaging of breast: Secondary | ICD-10-CM | POA: Diagnosis not present

## 2020-01-05 ENCOUNTER — Other Ambulatory Visit: Payer: Self-pay | Admitting: Infectious Diseases

## 2020-01-05 DIAGNOSIS — N6459 Other signs and symptoms in breast: Secondary | ICD-10-CM

## 2020-01-05 DIAGNOSIS — N6489 Other specified disorders of breast: Secondary | ICD-10-CM

## 2020-01-11 ENCOUNTER — Other Ambulatory Visit: Payer: Self-pay

## 2020-01-11 ENCOUNTER — Ambulatory Visit
Admission: RE | Admit: 2020-01-11 | Discharge: 2020-01-11 | Disposition: A | Discharge status: Home / Self Care | Payer: Medicare Other | Source: Ambulatory Visit | Attending: Infectious Diseases | Admitting: Infectious Diseases

## 2020-01-11 DIAGNOSIS — N6459 Other signs and symptoms in breast: Secondary | ICD-10-CM | POA: Diagnosis not present

## 2020-01-11 DIAGNOSIS — R928 Other abnormal and inconclusive findings on diagnostic imaging of breast: Secondary | ICD-10-CM | POA: Insufficient documentation

## 2020-01-11 DIAGNOSIS — N6489 Other specified disorders of breast: Secondary | ICD-10-CM | POA: Insufficient documentation

## 2020-01-11 HISTORY — PX: BREAST BIOPSY: SHX20

## 2020-01-12 LAB — SURGICAL PATHOLOGY

## 2020-01-13 ENCOUNTER — Telehealth: Payer: Self-pay

## 2020-01-13 NOTE — Telephone Encounter (Signed)
I called and left a message for the patient to call me back regarding a surgical referral.

## 2020-01-13 NOTE — Telephone Encounter (Signed)
Karla Roman has an appt with Dr. [name] on Sept 13 at 9:15.  Patient is aware of time/date.

## 2020-01-28 ENCOUNTER — Other Ambulatory Visit: Payer: Self-pay

## 2020-01-28 ENCOUNTER — Other Ambulatory Visit
Admission: RE | Admit: 2020-01-28 | Discharge: 2020-01-28 | Disposition: A | Discharge status: Home / Self Care | Payer: Medicare Other | Source: Ambulatory Visit | Attending: Gastroenterology | Admitting: Gastroenterology

## 2020-01-28 DIAGNOSIS — Z20822 Contact with and (suspected) exposure to covid-19: Secondary | ICD-10-CM | POA: Diagnosis not present

## 2020-01-28 DIAGNOSIS — Z01812 Encounter for preprocedural laboratory examination: Secondary | ICD-10-CM | POA: Diagnosis present

## 2020-01-29 LAB — SARS CORONAVIRUS 2 (TAT 6-24 HRS): SARS Coronavirus 2: NEGATIVE

## 2020-02-01 ENCOUNTER — Other Ambulatory Visit: Payer: Self-pay

## 2020-02-01 ENCOUNTER — Ambulatory Visit
Admission: RE | Admit: 2020-02-01 | Discharge: 2020-02-01 | Disposition: A | Discharge status: Home / Self Care | Payer: Medicare Other | Attending: Gastroenterology | Admitting: Gastroenterology

## 2020-02-01 ENCOUNTER — Ambulatory Visit: Payer: Medicare Other | Admitting: Anesthesiology

## 2020-02-01 ENCOUNTER — Encounter
Admission: RE | Disposition: A | Discharge status: Home / Self Care | Payer: Self-pay | Source: Home / Self Care | Attending: Gastroenterology

## 2020-02-01 DIAGNOSIS — Z881 Allergy status to other antibiotic agents status: Secondary | ICD-10-CM | POA: Insufficient documentation

## 2020-02-01 DIAGNOSIS — G4730 Sleep apnea, unspecified: Secondary | ICD-10-CM | POA: Diagnosis not present

## 2020-02-01 DIAGNOSIS — Z79899 Other long term (current) drug therapy: Secondary | ICD-10-CM | POA: Insufficient documentation

## 2020-02-01 DIAGNOSIS — Z7982 Long term (current) use of aspirin: Secondary | ICD-10-CM | POA: Insufficient documentation

## 2020-02-01 DIAGNOSIS — Z90710 Acquired absence of both cervix and uterus: Secondary | ICD-10-CM | POA: Diagnosis not present

## 2020-02-01 DIAGNOSIS — Z86010 Personal history of colonic polyps: Secondary | ICD-10-CM | POA: Diagnosis not present

## 2020-02-01 DIAGNOSIS — Z681 Body mass index (BMI) 19 or less, adult: Secondary | ICD-10-CM | POA: Diagnosis not present

## 2020-02-01 DIAGNOSIS — E785 Hyperlipidemia, unspecified: Secondary | ICD-10-CM | POA: Diagnosis not present

## 2020-02-01 DIAGNOSIS — D124 Benign neoplasm of descending colon: Secondary | ICD-10-CM | POA: Insufficient documentation

## 2020-02-01 DIAGNOSIS — M1990 Unspecified osteoarthritis, unspecified site: Secondary | ICD-10-CM | POA: Diagnosis not present

## 2020-02-01 DIAGNOSIS — M8580 Other specified disorders of bone density and structure, unspecified site: Secondary | ICD-10-CM | POA: Insufficient documentation

## 2020-02-01 DIAGNOSIS — D123 Benign neoplasm of transverse colon: Secondary | ICD-10-CM | POA: Insufficient documentation

## 2020-02-01 DIAGNOSIS — R159 Full incontinence of feces: Secondary | ICD-10-CM | POA: Insufficient documentation

## 2020-02-01 DIAGNOSIS — I341 Nonrheumatic mitral (valve) prolapse: Secondary | ICD-10-CM | POA: Insufficient documentation

## 2020-02-01 DIAGNOSIS — K640 First degree hemorrhoids: Secondary | ICD-10-CM | POA: Insufficient documentation

## 2020-02-01 DIAGNOSIS — K529 Noninfective gastroenteritis and colitis, unspecified: Secondary | ICD-10-CM | POA: Diagnosis not present

## 2020-02-01 DIAGNOSIS — J45909 Unspecified asthma, uncomplicated: Secondary | ICD-10-CM | POA: Diagnosis not present

## 2020-02-01 DIAGNOSIS — R634 Abnormal weight loss: Secondary | ICD-10-CM | POA: Insufficient documentation

## 2020-02-01 DIAGNOSIS — K219 Gastro-esophageal reflux disease without esophagitis: Secondary | ICD-10-CM | POA: Diagnosis not present

## 2020-02-01 DIAGNOSIS — K5730 Diverticulosis of large intestine without perforation or abscess without bleeding: Secondary | ICD-10-CM | POA: Diagnosis not present

## 2020-02-01 DIAGNOSIS — Z885 Allergy status to narcotic agent status: Secondary | ICD-10-CM | POA: Diagnosis not present

## 2020-02-01 DIAGNOSIS — K449 Diaphragmatic hernia without obstruction or gangrene: Secondary | ICD-10-CM | POA: Diagnosis not present

## 2020-02-01 DIAGNOSIS — K222 Esophageal obstruction: Secondary | ICD-10-CM | POA: Diagnosis not present

## 2020-02-01 DIAGNOSIS — R42 Dizziness and giddiness: Secondary | ICD-10-CM | POA: Insufficient documentation

## 2020-02-01 SURGERY — COLONOSCOPY WITH PROPOFOL
Anesthesia: General

## 2020-02-01 MED ORDER — PROPOFOL 10 MG/ML IV BOLUS
INTRAVENOUS | Status: AC
  Filled 2020-02-01: qty 20

## 2020-02-01 MED ORDER — PROPOFOL 10 MG/ML IV BOLUS
INTRAVENOUS | Status: DC | PRN
  Administered 2020-02-01 (×2): 40 mg via INTRAVENOUS

## 2020-02-01 MED ORDER — PROPOFOL 500 MG/50ML IV EMUL
INTRAVENOUS | Status: DC | PRN
  Administered 2020-02-01: 75 ug/kg/min via INTRAVENOUS

## 2020-02-01 MED ORDER — SODIUM CHLORIDE 0.9 % IV SOLN: INTRAVENOUS | Status: DC

## 2020-02-01 MED ORDER — LIDOCAINE HCL (PF) 2 % IJ SOLN
INTRAMUSCULAR | Status: AC
  Filled 2020-02-01: qty 5

## 2020-02-01 NOTE — Op Note (Signed)
[facility] Patient Name: Karla Roman Procedure Date: 02/01/2020 12:46 PM MRN: [id number] Account #: [id number] Date of Birth: [birth year] Admit Type: Outpatient Age: 79 Room: [facility] ENDO ROOM 3 Gender: Female Note Status: Finalized Procedure:             Colonoscopy Indications:           Change in bowel habits, Fecal incontinence, Weight loss Providers:             Andrey Farmer MD, MD Referring MD:          Mertie Clause. Fletcher Anon, MD (Referring MD) Medicines:             Monitored Anesthesia Care Complications:         No immediate complications. Estimated blood loss:                         Minimal. Procedure:             Pre-Anesthesia Assessment:                        - Prior to the procedure, a History and Physical was                         performed, and patient medications and allergies were                         reviewed. The patient is competent. The risks and                         benefits of the procedure and the sedation options and                         risks were discussed with the patient. All questions                         were answered and informed consent was obtained.                         Patient identification and proposed procedure were                         verified by the physician, the nurse, the anesthetist                         and the technician in the endoscopy suite. Mental                         Status Examination: alert and oriented. Airway                         Examination: normal oropharyngeal airway and neck                         mobility. Respiratory Examination: clear to                         auscultation. CV Examination: normal. Prophylactic  Antibiotics: The patient does not require prophylactic                         antibiotics. Prior Anticoagulants: The patient has                         taken no previous anticoagulant or antiplatelet agents                          except for aspirin. ASA Grade Assessment: II - A                         patient with mild systemic disease. After reviewing                         the risks and benefits, the patient was deemed in                         satisfactory condition to undergo the procedure. The                         anesthesia plan was to use monitored anesthesia care                         (MAC). Immediately prior to administration of                         medications, the patient was re-assessed for adequacy                         to receive sedatives. The heart rate, respiratory                         rate, oxygen saturations, blood pressure, adequacy of                         pulmonary ventilation, and response to care were                         monitored throughout the procedure. The physical                         status of the patient was re-assessed after the                         procedure.                        After obtaining informed consent, the colonoscope was                         passed under direct vision. Throughout the procedure,                         the patient's blood pressure, pulse, and oxygen                         saturations were monitored continuously. The  Colonoscope was introduced through the anus and                         advanced to the the terminal ileum. The colonoscopy                         was performed without difficulty. The patient                         tolerated the procedure well. The quality of the bowel                         preparation was good. Findings:      The perianal and digital rectal examinations were normal.      The terminal ileum contained two diverticula.      The terminal ileum appeared normal.      A 5 mm polyp was found in the hepatic flexure. The polyp was sessile.       The polyp was removed with a cold snare. Resection and retrieval were       complete. To prevent bleeding  post-intervention, one hemostatic clip was       successfully placed. There was no bleeding at the end of the procedure.      A 2 mm polyp was found in the descending colon. The polyp was sessile.       The polyp was removed with a jumbo cold forceps. Resection and retrieval       were complete. Estimated blood loss was minimal.      A 3 mm polyp was found in the descending colon. The polyp was sessile.       The polyp was removed with a cold snare. Resection and retrieval were       complete. Estimated blood loss was minimal.      A few small-mouthed diverticula were found in the sigmoid colon,       descending colon, transverse colon and ascending colon.      Non-bleeding internal hemorrhoids were found during retroflexion. The       hemorrhoids were Grade I (internal hemorrhoids that do not prolapse).      The exam was otherwise without abnormality on direct and retroflexion       views.      Biopsies for histology were taken with a cold forceps from the entire       colon for evaluation of microscopic colitis. Impression:            - Ileal diverticula.                        - The examined portion of the ileum was normal.                        - One 5 mm polyp at the hepatic flexure, removed with                         a cold snare. Resected and retrieved. Clip was placed.                        - One 2 mm polyp in the descending colon, removed with  a jumbo cold forceps. Resected and retrieved.                        - One 3 mm polyp in the descending colon, removed with                         a cold snare. Resected and retrieved.                        - Diverticulosis in the sigmoid colon, in the                         descending colon, in the transverse colon and in the                         ascending colon.                        - Non-bleeding internal hemorrhoids.                        - The examination was otherwise normal on direct and                          retroflexion views.                        - Biopsies were taken with a cold forceps from the                         entire colon for evaluation of microscopic colitis. Recommendation:        - Discharge patient to home.                        - Resume previous diet.                        - Continue present medications.                        - Await pathology results.                        - Repeat colonoscopy is not recommended due to current                         age (57 years or older) for surveillance.                        - Return to referring physician as previously                         scheduled. Procedure Code(s):     --- Professional ---                        [phone], Colonoscopy, flexible; with removal of                         tumor(s), polyp(s), or other lesion(s) by snare  technique                        45380, 59, Colonoscopy, flexible; with biopsy, single                         or multiple Diagnosis Code(s):     --- Professional ---                        K64.0, First degree hemorrhoids                        K63.5, Polyp of colon                        R19.4, Change in bowel habit                        R15.9, Full incontinence of feces                        R63.4, Abnormal weight loss                        K57.50, Diverticulosis of both small and large                         intestine without perforation or abscess without                         bleeding CPT copyright 2019 [employer]. All rights reserved. The codes documented in this report are preliminary and upon coder review may  be revised to meet current compliance requirements. Andrey Farmer, MD Andrey Farmer MD, MD 02/01/2020 1:44:21 PM Number of Addenda: 0 Note Initiated On: 02/01/2020 12:46 PM Scope Withdrawal Time: 0 hours 21 minutes 45 seconds  Total Procedure Duration: 0 hours 29 minutes 11 seconds  Estimated Blood Loss:   Estimated blood loss was minimal.      [facility]

## 2020-02-01 NOTE — Anesthesia Procedure Notes (Signed)
Procedure Name: MAC Date/Time: 02/01/2020 1:03 PM Performed by: Genevie Ann, CRNA Patient Re-evaluated:Patient Re-evaluated prior to induction Oxygen Delivery Method: Nasal cannula

## 2020-02-01 NOTE — Anesthesia Preprocedure Evaluation (Addendum)
Anesthesia Evaluation  Patient identified by MRN, date of birth, ID band Patient awake    Reviewed: Allergy & Precautions, H&P , NPO status , Patient's Chart, lab work & pertinent test results  History of Anesthesia Complications Negative for: history of anesthetic complications  Airway Mallampati: III  TM Distance: <3 FB Neck ROM: limited    Dental  (+) Chipped   Pulmonary asthma , neg sleep apnea,   Possible OSA before but borderline, patient was told she didn't need a cpap. Now she thinks her breathing is better at night   Pulmonary exam normal breath sounds clear to auscultation       Cardiovascular Exercise Tolerance: Good + CAD  Normal cardiovascular exam+ Valvular Problems/Murmurs MVP  Rhythm:Regular Rate:Normal  2018 stress test:  The left ventricular ejection fraction is hyperdynamic (>65%).  Nuclear stress EF: 69%.  There was no ST segment deviation noted during stress.  The study is normal.   1. EF 69%, normal wall motion.  2. No evidence for ischemia or infarction.   Normal study.     Neuro/Psych negative neurological ROS  negative psych ROS   GI/Hepatic Neg liver ROS, hiatal hernia, PUD, GERD  Medicated and Controlled,  Endo/Other  negative endocrine ROS  Renal/GU Renal disease     Musculoskeletal   Abdominal   Peds  Hematology negative hematology ROS (+)   Anesthesia Other Findings Past Medical History: No date: Allergic rhinitis No date: Allergy No date: Anemia     Comment:  in childhood No date: Arthritis No date: Asthma No date: Atrophic vaginitis No date: Cataract No date: Diverticulitis No date: Diverticulosis No date: Duodenal ulcer No date: Dyspareunia 09/2012, 10/2012: Dysplastic nevus     Comment:  right thigh, severe atypia, resected 10/2012 by Dr Jarome Matin No date: Esophagitis     Comment:  grade 1 No date: Eustachian tube dysfunction No date: GERD  (gastroesophageal reflux disease) No date: Hiatal hernia 02/2009: Hx of adenomatous colonic polyps     Comment:  Tubular adenoma in sigmoid. Diminutive.  No date: Hyperlipemia No date: MVP (mitral valve prolapse) No date: Osteopenia 1995: Schatzki's ring 10/2004: Sleep apnea     Comment:  Sleep study by Dr Gwenette Greet. does not need cpap No date: Vertigo  Past Surgical History: 2001: ABDOMINAL HYSTERECTOMY     Comment:  with anterior and posterior vaginal repair 1989: APPENDECTOMY     Comment:  ruptured 1969: BREAST BIOPSY; Right     Comment:  benign 01/2004: CERVICAL DISCECTOMY     Comment:  C3-4, C5-6, C6-7 04/2006: depuytren's contraction; Right 10/2012: dysplastic nevus     Comment:  right thigh 06/04/2013: ESOPHAGOGASTRODUODENOSCOPY; N/A     Comment:  Procedure: ESOPHAGOGASTRODUODENOSCOPY (EGD);  Surgeon:               Lafayette Dragon, MD;  Location: [facility] ENDOSCOPY;  Service:               Endoscopy;  Laterality: N/A; 04/2000: LAPAROSCOPY     Comment:  with lysis of adhesions 03/2013: right shoulder fracture; Right     Comment:  non-displaced, humeral head, non-operative mgt but she               developed frozen shoulder.  1976: SEPTOPLASTY 01/2004: SHOULDER SURGERY; Left     Comment:  release of adhesive capsulitis 1947: TONSILLECTOMY  BMI    Body Mass Index: 21.03 kg/m  Reproductive/Obstetrics negative OB ROS                            Anesthesia Physical  Anesthesia Plan  ASA: III  Anesthesia Plan: General   Post-op Pain Management:    Induction: Intravenous  PONV Risk Score and Plan: 3 and Dexamethasone, Ondansetron, Treatment may vary due to age or medical condition and TIVA  Airway Management Planned: Natural Airway  Additional Equipment: None  Intra-op Plan:   Post-operative Plan:   Informed Consent: I have reviewed the patients History and Physical, chart, labs and discussed the procedure including the risks, benefits and  alternatives for the proposed anesthesia with the patient or authorized representative who has indicated his/her understanding and acceptance.     Dental Advisory Given  Plan Discussed with: CRNA and Surgeon  Anesthesia Plan Comments: (Discussed risks of anesthesia with patient, including possibility of difficulty with spontaneous ventilation under anesthesia necessitating airway intervention, PONV, and rare risks such as cardiac or respiratory or neurological events. Patient understands.)        Anesthesia Quick Evaluation

## 2020-02-01 NOTE — Anesthesia Postprocedure Evaluation (Signed)
Anesthesia Post Note  Patient: Karla Roman  Procedure(s) Performed: COLONOSCOPY WITH PROPOFOL (N/A )  Patient location during evaluation: Endoscopy Anesthesia Type: General Level of consciousness: awake and alert Pain management: pain level controlled Vital Signs Assessment: post-procedure vital signs reviewed and stable Respiratory status: spontaneous breathing, nonlabored ventilation, respiratory function stable and patient connected to nasal cannula oxygen Cardiovascular status: blood pressure returned to baseline and stable Postop Assessment: no apparent nausea or vomiting Anesthetic complications: no   No complications documented.   Last Vitals:  Vitals:   02/01/20 1343 02/01/20 1350  BP: (!) 99/36 (!) 112/49  Pulse: 72 64  Resp: 15 16  Temp: 36.4 C   SpO2: 96% 96%    Last Pain:  Vitals:   02/01/20 1350  PainSc: 0-No pain                 Arita Miss

## 2020-02-01 NOTE — Interval H&P Note (Signed)
History and Physical Interval Note:  02/01/2020 12:58 PM  Karla Roman  has presented today for surgery, with the diagnosis of CHANGE BH.  The various methods of treatment have been discussed with the patient and family. After consideration of risks, benefits and other options for treatment, the patient has consented to  Procedure(s): COLONOSCOPY WITH PROPOFOL (N/A) as a surgical intervention.  The patient's history has been reviewed, patient examined, no change in status, stable for surgery.  I have reviewed the patient's chart and labs.  Questions were answered to the patient's satisfaction.     Lesly Rubenstein  Ok to proceed with colonoscopy

## 2020-02-01 NOTE — Transfer of Care (Signed)
Immediate Anesthesia Transfer of Care Note  Patient: LAITYN BENSEN  Procedure(s) Performed: COLONOSCOPY WITH PROPOFOL (N/A )  Patient Location: PACU and Endoscopy Unit  Anesthesia Type:MAC  Level of Consciousness: sedated  Airway & Oxygen Therapy: Patient Spontanous Breathing and Patient connected to face mask oxygen  Post-op Assessment: Report given to RN and Post -op Vital signs reviewed and stable  Post vital signs: Reviewed and stable  Last Vitals:  Vitals Value Taken Time  BP 99/36 02/01/20 1343  Temp 36.4 C 02/01/20 1343  Pulse 67 02/01/20 1345  Resp 16 02/01/20 1345  SpO2 96 % 02/01/20 1345  Vitals shown include unvalidated device data.  Last Pain:  Vitals:   02/01/20 1343  PainSc: Asleep         Complications: No complications documented.

## 2020-02-01 NOTE — H&P (Signed)
Outpatient short stay form Pre-procedure 02/01/2020 12:56 PM Karla Miyamoto MD, MPH  Primary Physician: Dr. Ola Spurr  Reason for visit:  Fecal Incontinence  History of present illness:   79 y/o lady with history of fecal incontinence here for colonoscopy. Has history of hysterectomy. Had colonoscopy in 2010 with on small polyp, recommended 10 year follow-up. No blood thinners. No family history of GI malignancies.    Current Facility-Administered Medications:  .  0.9 %  sodium chloride infusion, , Intravenous, Continuous, Lynnita Somma, Hilton Cork, MD, Last Rate: 20 mL/hr at 02/01/20 1244, New Bag at 02/01/20 1244  Medications Prior to Admission  Medication Sig Dispense Refill Last Dose  . acetaminophen (TYLENOL) 500 MG tablet Take 1,000 mg by mouth every 4 (four) hours as needed for mild pain or headache.      . Ascorbic Acid (VITAMIN C) 500 MG CHEW Chew 500 mg by mouth daily.     Marland Kitchen aspirin EC 81 MG tablet Take 1 tablet (81 mg total) by mouth daily. (Patient taking differently: Take 81 mg by mouth at bedtime. ) 90 tablet 1 01/30/2020  . atorvastatin (LIPITOR) 20 MG tablet TAKE 1 TABLET BY MOUTH  DAILY (Patient taking differently: Take 20 mg by mouth every evening. ) 90 tablet 1   . Biotin 5000 MCG TABS Take 5,000 mcg by mouth daily.     . Ca Phosphate-Cholecalciferol (CALTRATE GUMMY BITES PO) Take 1 tablet by mouth in the morning and at bedtime.     Marland Kitchen CANNABIDIOL PO Place 1 drop under the tongue at bedtime. CBD Oil     . cholecalciferol (QC VITAMIN D3) 10 MCG (400 UNIT) TABS tablet Take 400 Units by mouth.     . citalopram (CELEXA) 10 MG tablet Take 10 mg by mouth daily.     . Glucosamine-Chondroitin (COSAMIN DS PO) Take 1 tablet by mouth in the morning and at bedtime.     Marland Kitchen HYDROcodone-acetaminophen (NORCO) 5-325 MG tablet Take 1 tablet by mouth every 6 (six) hours as needed for moderate pain. 15 tablet 0   . Multiple Vitamin (MULTIVITAMIN WITH MINERALS) TABS tablet Take 2 tablets by mouth  daily.     . Omega-3 Fatty Acids (OMEGA 3 PO) Take 2.5 g by mouth daily.     . pantoprazole (PROTONIX) 40 MG tablet TAKE 1 TABLET BY MOUTH  DAILY BEFORE BREAKFAST (Patient taking differently: Take 40 mg by mouth daily before breakfast. ) 90 tablet 1   . psyllium (METAMUCIL) 58.6 % packet Take 0.5 packets by mouth at bedtime.     . saccharomyces boulardii (FLORASTOR) 250 MG capsule Take 250 mg by mouth daily.     . vitamin E 180 MG (400 UNITS) capsule Take 400 Units by mouth daily.        Allergies  Allergen Reactions  . Codeine Other (See Comments)    Anxiety, restlessness  . Levaquin [Levofloxacin]     Nausea, vomiting, diarrhea.  . Oxycodone Other (See Comments)    Altered mental status     Past Medical History:  Diagnosis Date  . Allergic rhinitis   . Allergy   . Anemia    in childhood  . Arthritis   . Asthma   . Atrophic vaginitis   . Cataract   . Diverticulitis   . Diverticulosis   . Duodenal ulcer   . Dyspareunia   . Dysplastic nevus 09/2012, 10/2012   right thigh, severe atypia, resected 10/2012 by Dr Jarome Matin  . Esophagitis  grade 1  . Eustachian tube dysfunction   . GERD (gastroesophageal reflux disease)   . Hiatal hernia   . Hx of adenomatous colonic polyps 02/2009   Tubular adenoma in sigmoid. Diminutive.   . Hyperlipemia   . MVP (mitral valve prolapse)   . Osteopenia   . Schatzki's ring 1995  . Sleep apnea 10/2004   Sleep study by Dr Gwenette Greet. does not need cpap  . Vertigo     Review of systems:  Otherwise negative.    Physical Exam  Gen: Alert, oriented. Appears stated age.  HEENT: [place]/AT. PERRLA. Lungs: No respiratory distress Abd: soft, benign, no masses. Ext: No edema.    Planned procedures: Proceed with colonoscopy. The patient understands the nature of the planned procedure, indications, risks, alternatives and potential complications including but not limited to bleeding, infection, perforation, damage to internal organs and possible  oversedation/side effects from anesthesia. The patient agrees and gives consent to proceed.  Please refer to procedure notes for findings, recommendations and patient disposition/instructions.     Karla Miyamoto MD, MPH Gastroenterology 02/01/2020  12:56 PM

## 2020-02-02 ENCOUNTER — Encounter: Payer: Self-pay | Admitting: Gastroenterology

## 2020-02-02 LAB — SURGICAL PATHOLOGY

## 2020-02-02 NOTE — OR Nursing (Signed)
Office notified of loose stools.

## 2020-02-23 ENCOUNTER — Ambulatory Visit
Admission: RE | Admit: 2020-02-23 | Discharge: 2020-02-23 | Disposition: A | Discharge status: Home / Self Care | Payer: Medicare Other | Source: Ambulatory Visit | Attending: Infectious Diseases | Admitting: Infectious Diseases

## 2020-02-23 ENCOUNTER — Other Ambulatory Visit: Payer: Self-pay | Admitting: Infectious Diseases

## 2020-02-23 ENCOUNTER — Other Ambulatory Visit: Payer: Self-pay

## 2020-02-23 ENCOUNTER — Other Ambulatory Visit (HOSPITAL_COMMUNITY): Payer: Self-pay | Admitting: Infectious Diseases

## 2020-02-23 DIAGNOSIS — R200 Anesthesia of skin: Secondary | ICD-10-CM

## 2020-02-23 DIAGNOSIS — G459 Transient cerebral ischemic attack, unspecified: Secondary | ICD-10-CM

## 2020-04-24 ENCOUNTER — Other Ambulatory Visit (HOSPITAL_COMMUNITY): Payer: Self-pay | Admitting: Neurology

## 2020-04-24 ENCOUNTER — Other Ambulatory Visit: Payer: Self-pay | Admitting: Neurology

## 2020-04-24 DIAGNOSIS — G3184 Mild cognitive impairment, so stated: Secondary | ICD-10-CM

## 2020-05-01 ENCOUNTER — Ambulatory Visit (HOSPITAL_COMMUNITY)
Admission: RE | Admit: 2020-05-01 | Discharge: 2020-05-01 | Disposition: A | Discharge status: Home / Self Care | Payer: Medicare Other | Source: Ambulatory Visit | Attending: Neurology | Admitting: Neurology

## 2020-05-01 ENCOUNTER — Other Ambulatory Visit: Payer: Self-pay

## 2020-05-01 DIAGNOSIS — G3184 Mild cognitive impairment, so stated: Secondary | ICD-10-CM | POA: Diagnosis not present

## 2020-05-01 MED ORDER — GADOBUTROL 1 MMOL/ML IV SOLN
4.5000 mL | Freq: Once | INTRAVENOUS | Status: AC | PRN
  Administered 2020-05-01: 4.5 mL via INTRAVENOUS

## 2020-05-16 ENCOUNTER — Other Ambulatory Visit: Payer: Self-pay | Admitting: Surgery

## 2020-05-16 DIAGNOSIS — N63 Unspecified lump in unspecified breast: Secondary | ICD-10-CM

## 2020-07-11 ENCOUNTER — Ambulatory Visit
Admission: RE | Admit: 2020-07-11 | Discharge: 2020-07-11 | Disposition: A | Discharge status: Home / Self Care | Payer: Medicare Other | Source: Ambulatory Visit | Attending: Surgery | Admitting: Surgery

## 2020-07-11 ENCOUNTER — Other Ambulatory Visit: Payer: Self-pay

## 2020-07-11 DIAGNOSIS — N63 Unspecified lump in unspecified breast: Secondary | ICD-10-CM

## 2020-07-11 DIAGNOSIS — N6320 Unspecified lump in the left breast, unspecified quadrant: Secondary | ICD-10-CM | POA: Diagnosis present

## 2020-07-11 DIAGNOSIS — N6321 Unspecified lump in the left breast, upper outer quadrant: Secondary | ICD-10-CM | POA: Diagnosis not present

## 2020-07-31 ENCOUNTER — Ambulatory Visit: Payer: Self-pay | Admitting: Surgery

## 2020-07-31 NOTE — H&P (Signed)
Subjective:   CC: Lump or mass in breast [N63.0] HPI:  Karla Roman is a 80 y.o. female who returns for evaluation of above.   No significant change noted in 60mo f/u mammogram for persistently suspicious area at previous biopsy site.  Pt herself has not noticed any changes either.   Past Medical History:  has a past medical history of Allergy, Anemia, Anxiety, Asthma, unspecified asthma severity, unspecified whether complicated, unspecified whether persistent, CAD (coronary artery disease), Cervical spinal stenosis, Chronic kidney disease, Cystitis, Depression, GERD (gastroesophageal reflux disease), Hyperlipidemia, Mitral valve prolapse, Osteoarthritis, Osteopenia, and Osteoporosis.  Past Surgical History:  has a past surgical history that includes Hysterectomy; cervical disc arthroplasty; anterior approach (2016); arthroplasty total shoulder (Right, 2016); Rhinoplasty; ORIF distal radius fracture (Left, 11/11/2019); Percutaneous Biopsy Breast; Colonoscopy (02/01/2020); and Oophorectomy.  Family History: family history includes Alzheimer's disease in her sister; Dementia in her mother; Diabetes in her brother; Diabetes type II in her brother; Fibromyalgia in her sister; Heart disease in her brother and mother; Migraines in her daughter; Myocardial Infarction (Heart attack) in her brother and father; Stroke in her mother.  Social History:  reports that she has never smoked. She has never used smokeless tobacco. She reports previous alcohol use. She reports previous drug use.  Current Medications: has a current medication list which includes the following prescription(s): acetaminophen, alendronate, ascorbic acid, aspirin, atorvastatin, biotin, calcium carbonate, cholecalciferol (vitamin d3), citalopram, glucosamin,chondroit-mvit-min3, multivitamin, omega-3 acid ethyl esters, pantoprazole, psyllium husk, saccharomyces boulardii, vitamin e, and cranberry.  Allergies:  Allergies as of  07/31/2020 - Reviewed 07/31/2020  Allergen Reaction Noted  . Levofloxacin Diarrhea and Nausea And Vomiting 06/01/2013  . Codeine Other (See Comments) 09/16/2019  . Oxycodone Other (See Comments) 05/08/2015    ROS:  A 15 point review of systems was performed and was negative except as noted in HPI   Objective:     BP 130/60   Pulse 67   Ht 157.5 cm (5\' 2" )   Wt 50.3 kg (111 lb)   BMI 20.30 kg/m   Constitutional :  alert, appears stated age, cooperative and no distress  Lymphatics/Throat:  no asymmetry, masses, or scars  Respiratory:  clear to auscultation bilaterally  Cardiovascular:  regular rate and rhythm  Gastrointestinal: soft, non-tender; bowel sounds normal; no masses,  no organomegaly.   Musculoskeletal: Steady gait and movement  Skin: Cool and moist  Psychiatric: Normal affect, non-agitated, not confused  Breast:  Chaperone present for exam.  breasts appear normal, no suspicious masses, no skin or nipple changes or axillary nodes.    LABS:  n/a   RADS: CLINICAL DATA: Follow-up possible subtle distortion in the  posterior upper left breast biopsied under stereotactic guidance on  01/11/2020 benign results including benign breast tissue with dense  stromal fibrosis.   EXAM:  DIGITAL DIAGNOSTIC UNILATERAL LEFT MAMMOGRAM WITH TOMOSYNTHESIS AND  CAD; ULTRASOUND LEFT BREAST LIMITED   TECHNIQUE:  Left digital diagnostic mammography and breast tomosynthesis was  performed. The images were evaluated with computer-aided detection.;  Targeted ultrasound examination of the left breast was performed   COMPARISON: Previous exam(s).   ACR Breast Density Category c: The breast tissue is heterogeneously  dense, which may obscure small masses.   FINDINGS:  In the oblique projection, there is more scree, definite distortion  in the posterior aspect of the upper aspect of the breast at the  location of the X shaped biopsy marker clip from the previous  stereotactic  guided core needle biopsy with  benign results. No  distortion is seen in the craniocaudal projection. No findings  elsewhere in the left breast suspicious for malignancy.   On physical exam, no mass is palpable in the upper outer left  breast.   Targeted ultrasound is performed, showing a vertically oriented,  somewhat curvilinear hypoechoic area in the 1:30 o'clock position of  the left breast, 7 cm from the nipple. Initially, in the radial  plane, this measures approximately 1.0 x 0.4 cm. There appear to be  a biopsy marker clip within this area at real time. This could not  be reliably reproduced with further imaging in either plane.   Ultrasound of the left axilla demonstrated normal appearing left  axillary lymph nodes.   IMPRESSION:  1. Probable post biopsy changes in the 1:30 o'clock position of the  left breast with associated increaseddistortion and probable post  biopsy changes at ultrasound. Based on the location of the biopsy  marker clip and pathology results, malignancy is less likely but not  excluded.  2. No evidence of malignancy elsewhere in the left breast.   RECOMMENDATION:  1. Consider mammography guided localization and excision of the area  of distortion at the location of the X shaped biopsy marker clip.  2. If this area is not surgically excised, a follow-up bilateral  diagnostic mammogram and left breast ultrasoundwould be recommended  in 6 months.   I have discussed the findings and recommendations with the patient.  If applicable, a reminder letter will be sent to the patient  regarding the next appointment.   BI-RADS CATEGORY 4: Suspicious.    Electronically Signed  By: Claudie Revering M.D.  On: 07/11/2020 12:37   Assessment:   Lump or mass in breast [N63.0] , LEFT Persistent abnormality noted at previous biopsy site on left breast  Plan:     1. Lump or mass in breast [N63.0] , LEFT Discussed possible surgical excisional biopsy of  area due to persistent distortion noted on mammogram even, 29mo after initial diagnosis.  I explained to her that there is no worsening, but there was also no improvement in appearance of area.  Cannot completely exclude sampling error for malignancy in the area, as noted in mammogram report.  The only definitive diagnosis will be through an excisional biopsy.   Discussed the risk of surgery including recurrence, chronic pain, post-op infxn, poor/delayed wound healing, poor cosmesis, seroma, hematoma formation, and possible re-operation to address said risks. The risks of general anesthetic, if used, includes MI, CVA, sudden death or even reaction to anesthetic medications also discussed.  Typical post-op recovery time and possbility of activity restrictions were also discussed.  Alternatives include continued observation.  Benefits include possible symptom relief, pathologic evaluation, and/or curative excision.   The patient verbalized understanding and all questions were answered to the patient's satisfaction.  Will proceed with scheduling.

## 2020-07-31 NOTE — H&P (View-Only) (Signed)
Subjective:   CC: Lump or mass in breast [N63.0] HPI:  Karla Roman is a 80 y.o. female who returns for evaluation of above.   No significant change noted in 6mo f/u mammogram for persistently suspicious area at previous biopsy site.  Pt herself has not noticed any changes either.   Past Medical History:  has a past medical history of Allergy, Anemia, Anxiety, Asthma, unspecified asthma severity, unspecified whether complicated, unspecified whether persistent, CAD (coronary artery disease), Cervical spinal stenosis, Chronic kidney disease, Cystitis, Depression, GERD (gastroesophageal reflux disease), Hyperlipidemia, Mitral valve prolapse, Osteoarthritis, Osteopenia, and Osteoporosis.  Past Surgical History:  has a past surgical history that includes Hysterectomy; cervical disc arthroplasty; anterior approach (2016); arthroplasty total shoulder (Right, 2016); Rhinoplasty; ORIF distal radius fracture (Left, 11/11/2019); Percutaneous Biopsy Breast; Colonoscopy (02/01/2020); and Oophorectomy.  Family History: family history includes Alzheimer's disease in her sister; Dementia in her mother; Diabetes in her brother; Diabetes type II in her brother; Fibromyalgia in her sister; Heart disease in her brother and mother; Migraines in her daughter; Myocardial Infarction (Heart attack) in her brother and father; Stroke in her mother.  Social History:  reports that she has never smoked. She has never used smokeless tobacco. She reports previous alcohol use. She reports previous drug use.  Current Medications: has a current medication list which includes the following prescription(s): acetaminophen, alendronate, ascorbic acid, aspirin, atorvastatin, biotin, calcium carbonate, cholecalciferol (vitamin d3), citalopram, glucosamin,chondroit-mvit-min3, multivitamin, omega-3 acid ethyl esters, pantoprazole, psyllium husk, saccharomyces boulardii, vitamin e, and cranberry.  Allergies:  Allergies as of  07/31/2020 - Reviewed 07/31/2020  Allergen Reaction Noted  . Levofloxacin Diarrhea and Nausea And Vomiting 06/01/2013  . Codeine Other (See Comments) 09/16/2019  . Oxycodone Other (See Comments) 05/08/2015    ROS:  A 15 point review of systems was performed and was negative except as noted in HPI   Objective:     BP 130/60   Pulse 67   Ht 157.5 cm (5' 2")   Wt 50.3 kg (111 lb)   BMI 20.30 kg/m   Constitutional :  alert, appears stated age, cooperative and no distress  Lymphatics/Throat:  no asymmetry, masses, or scars  Respiratory:  clear to auscultation bilaterally  Cardiovascular:  regular rate and rhythm  Gastrointestinal: soft, non-tender; bowel sounds normal; no masses,  no organomegaly.   Musculoskeletal: Steady gait and movement  Skin: Cool and moist  Psychiatric: Normal affect, non-agitated, not confused  Breast:  Chaperone present for exam.  breasts appear normal, no suspicious masses, no skin or nipple changes or axillary nodes.    LABS:  n/a   RADS: CLINICAL DATA: Follow-up possible subtle distortion in the  posterior upper left breast biopsied under stereotactic guidance on  01/11/2020 benign results including benign breast tissue with dense  stromal fibrosis.   EXAM:  DIGITAL DIAGNOSTIC UNILATERAL LEFT MAMMOGRAM WITH TOMOSYNTHESIS AND  CAD; ULTRASOUND LEFT BREAST LIMITED   TECHNIQUE:  Left digital diagnostic mammography and breast tomosynthesis was  performed. The images were evaluated with computer-aided detection.;  Targeted ultrasound examination of the left breast was performed   COMPARISON: Previous exam(s).   ACR Breast Density Category c: The breast tissue is heterogeneously  dense, which may obscure small masses.   FINDINGS:  In the oblique projection, there is more scree, definite distortion  in the posterior aspect of the upper aspect of the breast at the  location of the X shaped biopsy marker clip from the previous  stereotactic  guided core needle biopsy with   benign results. No  distortion is seen in the craniocaudal projection. No findings  elsewhere in the left breast suspicious for malignancy.   On physical exam, no mass is palpable in the upper outer left  breast.   Targeted ultrasound is performed, showing a vertically oriented,  somewhat curvilinear hypoechoic area in the 1:30 o'clock position of  the left breast, 7 cm from the nipple. Initially, in the radial  plane, this measures approximately 1.0 x 0.4 cm. There appear to be  a biopsy marker clip within this area at real time. This could not  be reliably reproduced with further imaging in either plane.   Ultrasound of the left axilla demonstrated normal appearing left  axillary lymph nodes.   IMPRESSION:  1. Probable post biopsy changes in the 1:30 o'clock position of the  left breast with associated increaseddistortion and probable post  biopsy changes at ultrasound. Based on the location of the biopsy  marker clip and pathology results, malignancy is less likely but not  excluded.  2. No evidence of malignancy elsewhere in the left breast.   RECOMMENDATION:  1. Consider mammography guided localization and excision of the area  of distortion at the location of the X shaped biopsy marker clip.  2. If this area is not surgically excised, a follow-up bilateral  diagnostic mammogram and left breast ultrasoundwould be recommended  in 6 months.   I have discussed the findings and recommendations with the patient.  If applicable, a reminder letter will be sent to the patient  regarding the next appointment.   BI-RADS CATEGORY 4: Suspicious.    Electronically Signed  By: Claudie Revering M.D.  On: 07/11/2020 12:37   Assessment:   Lump or mass in breast [N63.0] , LEFT Persistent abnormality noted at previous biopsy site on left breast  Plan:     1. Lump or mass in breast [N63.0] , LEFT Discussed possible surgical excisional biopsy of  area due to persistent distortion noted on mammogram even, 18mo after initial diagnosis.  I explained to her that there is no worsening, but there was also no improvement in appearance of area.  Cannot completely exclude sampling error for malignancy in the area, as noted in mammogram report.  The only definitive diagnosis will be through an excisional biopsy.   Discussed the risk of surgery including recurrence, chronic pain, post-op infxn, poor/delayed wound healing, poor cosmesis, seroma, hematoma formation, and possible re-operation to address said risks. The risks of general anesthetic, if used, includes MI, CVA, sudden death or even reaction to anesthetic medications also discussed.  Typical post-op recovery time and possbility of activity restrictions were also discussed.  Alternatives include continued observation.  Benefits include possible symptom relief, pathologic evaluation, and/or curative excision.   The patient verbalized understanding and all questions were answered to the patient's satisfaction.  Will proceed with scheduling.

## 2020-08-01 ENCOUNTER — Other Ambulatory Visit: Payer: Self-pay | Admitting: Surgery

## 2020-08-01 DIAGNOSIS — N63 Unspecified lump in unspecified breast: Secondary | ICD-10-CM

## 2020-08-04 ENCOUNTER — Other Ambulatory Visit
Admission: RE | Admit: 2020-08-04 | Discharge: 2020-08-04 | Disposition: A | Discharge status: Home / Self Care | Payer: Medicare Other | Source: Ambulatory Visit | Attending: Surgery | Admitting: Surgery

## 2020-08-04 ENCOUNTER — Other Ambulatory Visit: Payer: Self-pay

## 2020-08-04 HISTORY — DX: Pneumonia, unspecified organism: J18.9

## 2020-08-04 HISTORY — DX: Anxiety disorder, unspecified: F41.9

## 2020-08-04 HISTORY — DX: Depression, unspecified: F32.A

## 2020-08-04 NOTE — Patient Instructions (Addendum)
Your procedure is scheduled on: 08/10/20- Thursday Report to the Registration Desk on the 1st floor of the [facility]. To find out your arrival time, please call [phone] between 1PM - 3PM on: 08/09/20- Wednesday  Report to [facility] on 08/08/20 at 10 am for EKG and Covid test.  REMEMBER: Instructions that are not followed completely may result in serious medical risk, up to and including death; or upon the discretion of your surgeon and anesthesiologist your surgery may need to be rescheduled.  Do not eat food after midnight the night before surgery.  No gum chewing, lozengers or hard candies.  You may however, drink CLEAR liquids up to 2 hours before you are scheduled to arrive for your surgery. Do not drink anything within 2 hours of your scheduled arrival time.  Clear liquids include: - water  - apple juice without pulp - gatorade (not RED, PURPLE, OR BLUE) - black coffee or tea (Do NOT add milk or creamers to the coffee or tea) Do NOT drink anything that is not on this list.  TAKE THESE MEDICATIONS THE MORNING OF SURGERY WITH A SIP OF WATER:  - pantoprazole (PROTONIX) 40 MG tablet, take one the night before and one on the morning of surgery - helps to prevent nausea after surgery. - citalopram (CELEXA) 10 MG tablet  Follow recommendations from Cardiologist, Pulmonologist or PCP regarding stopping Aspirin, Coumadin, Plavix, Eliquis, Pradaxa, or Pletal. Stop taking 7 days before your procedure.  One week prior to surgery: Stop Anti-inflammatories (NSAIDS) such as Advil, Aleve, Ibuprofen, Motrin, Naproxen, Naprosyn and Aspirin based products such as Excedrin, Goodys Powder, BC Powder.  Stop ANY OVER THE COUNTER supplements until after surgery.  No Alcohol for 24 hours before or after surgery.  No Smoking including e-cigarettes for 24 hours prior to surgery.  No chewable tobacco products for at least 6 hours prior to surgery.  No nicotine patches on the day of  surgery.  Do not use any "recreational" drugs for at least a week prior to your surgery.  Please be advised that the combination of cocaine and anesthesia may have negative outcomes, up to and including death. If you test positive for cocaine, your surgery will be cancelled.  On the morning of surgery brush your teeth with toothpaste and water, you may rinse your mouth with mouthwash if you wish. Do not swallow any toothpaste or mouthwash.  Do not wear jewelry, make-up, hairpins, clips or nail polish.  Do not wear lotions, powders, or perfumes.   Do not shave body from the neck down 48 hours prior to surgery just in case you cut yourself which could leave a site for infection.  Also, freshly shaved skin may become irritated if using the CHG soap.  Contact lenses, hearing aids and dentures may not be worn into surgery.  Do not bring valuables to the hospital. [facility] is not responsible for any missing/lost belongings or valuables.   Use CHG Soap or wipes as directed on instruction sheet.  Notify your doctor if there is any change in your medical condition (cold, fever, infection).  Wear comfortable clothing (specific to your surgery type) to the hospital.  Plan for stool softeners for home use; pain medications have a tendency to cause constipation. You can also help prevent constipation by eating foods high in fiber such as fruits and vegetables and drinking plenty of fluids as your diet allows.  After surgery, you can help prevent lung complications by doing breathing exercises.  Take  deep breaths and cough every 1-2 hours. Your doctor may order a device called an Incentive Spirometer to help you take deep breaths. When coughing or sneezing, hold a pillow firmly against your incision with both hands. This is called "splinting." Doing this helps protect your incision. It also decreases belly discomfort.  If you are being admitted to the hospital overnight, leave your suitcase in  the car. After surgery it may be brought to your room.  If you are being discharged the day of surgery, you will not be allowed to drive home. You will need a responsible adult (18 years or older) to drive you home and stay with you that night.   If you are taking public transportation, you will need to have a responsible adult (18 years or older) with you. Please confirm with your physician that it is acceptable to use public transportation.   Please call the [facility] at [phone] if you have any questions about these instructions.  Surgery Visitation Policy:  Patients undergoing a surgery or procedure may have one family member or support person with them as long as that person is not COVID-19 positive or experiencing its symptoms.  That person may remain in the waiting area during the procedure.  Inpatient Visitation:    Visiting hours are 7 a.m. to 8 p.m. Inpatients will be allowed two visitors daily. The visitors may change each day during the patient's stay. No visitors under the age of 28. Any visitor under the age of 85 must be accompanied by an adult. The visitor must pass COVID-19 screenings, use hand sanitizer when entering and exiting the patient's room and wear a mask at all times, including in the patient's room. Patients must also wear a mask when staff or their visitor are in the room. Masking is required regardless of vaccination status.

## 2020-08-07 ENCOUNTER — Ambulatory Visit
Admission: RE | Admit: 2020-08-07 | Discharge: 2020-08-07 | Disposition: A | Discharge status: Home / Self Care | Payer: Medicare Other | Source: Ambulatory Visit | Attending: Surgery | Admitting: Surgery

## 2020-08-07 ENCOUNTER — Other Ambulatory Visit: Payer: Self-pay

## 2020-08-07 DIAGNOSIS — N63 Unspecified lump in unspecified breast: Secondary | ICD-10-CM | POA: Diagnosis not present

## 2020-08-08 ENCOUNTER — Encounter
Admission: RE | Admit: 2020-08-08 | Discharge: 2020-08-08 | Disposition: A | Discharge status: Home / Self Care | Payer: Medicare Other | Source: Ambulatory Visit | Attending: Surgery | Admitting: Surgery

## 2020-08-08 ENCOUNTER — Other Ambulatory Visit: Payer: Self-pay

## 2020-08-08 DIAGNOSIS — R54 Age-related physical debility: Secondary | ICD-10-CM | POA: Diagnosis not present

## 2020-08-08 DIAGNOSIS — Z01818 Encounter for other preprocedural examination: Secondary | ICD-10-CM | POA: Insufficient documentation

## 2020-08-08 DIAGNOSIS — Z20822 Contact with and (suspected) exposure to covid-19: Secondary | ICD-10-CM | POA: Diagnosis not present

## 2020-08-08 LAB — SARS CORONAVIRUS 2 (TAT 6-24 HRS): SARS Coronavirus 2: NEGATIVE

## 2020-08-09 MED ORDER — ORAL CARE MOUTH RINSE: 15.0000 mL | Freq: Once | OROMUCOSAL | Status: AC

## 2020-08-09 MED ORDER — CEFAZOLIN SODIUM-DEXTROSE 2-4 GM/100ML-% IV SOLN
2.0000 g | INTRAVENOUS | Status: AC
  Administered 2020-08-10: 1 g via INTRAVENOUS

## 2020-08-09 MED ORDER — LACTATED RINGERS IV SOLN: INTRAVENOUS | Status: DC

## 2020-08-09 MED ORDER — CHLORHEXIDINE GLUCONATE CLOTH 2 % EX PADS: 6.0000 | MEDICATED_PAD | Freq: Once | CUTANEOUS | Status: DC

## 2020-08-09 MED ORDER — CHLORHEXIDINE GLUCONATE 0.12 % MT SOLN: 15.0000 mL | Freq: Once | OROMUCOSAL | Status: AC

## 2020-08-10 ENCOUNTER — Ambulatory Visit
Admission: RE | Admit: 2020-08-10 | Discharge: 2020-08-10 | Disposition: A | Discharge status: Home / Self Care | Payer: Medicare Other | Source: Ambulatory Visit | Attending: Surgery | Admitting: Surgery

## 2020-08-10 ENCOUNTER — Ambulatory Visit: Payer: Medicare Other | Admitting: Urgent Care

## 2020-08-10 ENCOUNTER — Encounter
Admission: RE | Disposition: A | Discharge status: Home / Self Care | Payer: Self-pay | Source: Home / Self Care | Attending: Surgery

## 2020-08-10 ENCOUNTER — Encounter: Payer: Self-pay | Admitting: Surgery

## 2020-08-10 ENCOUNTER — Other Ambulatory Visit: Payer: Self-pay

## 2020-08-10 ENCOUNTER — Ambulatory Visit
Admission: RE | Admit: 2020-08-10 | Discharge: 2020-08-10 | Disposition: A | Discharge status: Home / Self Care | Payer: Medicare Other | Attending: Surgery | Admitting: Surgery

## 2020-08-10 DIAGNOSIS — Z885 Allergy status to narcotic agent status: Secondary | ICD-10-CM | POA: Diagnosis not present

## 2020-08-10 DIAGNOSIS — N6320 Unspecified lump in the left breast, unspecified quadrant: Secondary | ICD-10-CM | POA: Diagnosis present

## 2020-08-10 DIAGNOSIS — Z7982 Long term (current) use of aspirin: Secondary | ICD-10-CM | POA: Diagnosis not present

## 2020-08-10 DIAGNOSIS — N63 Unspecified lump in unspecified breast: Secondary | ICD-10-CM

## 2020-08-10 DIAGNOSIS — Z881 Allergy status to other antibiotic agents status: Secondary | ICD-10-CM | POA: Diagnosis not present

## 2020-08-10 DIAGNOSIS — Q839 Congenital malformation of breast, unspecified: Secondary | ICD-10-CM

## 2020-08-10 DIAGNOSIS — Z79899 Other long term (current) drug therapy: Secondary | ICD-10-CM | POA: Diagnosis not present

## 2020-08-10 HISTORY — PX: BREAST EXCISIONAL BIOPSY: SUR124

## 2020-08-10 HISTORY — PX: BREAST BIOPSY: SHX20

## 2020-08-10 LAB — POCT I-STAT, CHEM 8
BUN: 15 mg/dL (ref 8–23)
Calcium, Ion: 1.22 mmol/L (ref 1.15–1.40)
Chloride: 103 mmol/L (ref 98–111)
Creatinine, Ser: 0.9 mg/dL (ref 0.44–1.00)
Glucose, Bld: 104 mg/dL — ABNORMAL HIGH (ref 70–99)
HCT: 36 % (ref 36.0–46.0)
Hemoglobin: 12.2 g/dL (ref 12.0–15.0)
Potassium: 3.9 mmol/L (ref 3.5–5.1)
Sodium: 140 mmol/L (ref 135–145)
TCO2: 26 mmol/L (ref 22–32)

## 2020-08-10 SURGERY — BREAST BIOPSY
Anesthesia: General | Laterality: Left

## 2020-08-10 MED ORDER — LIDOCAINE HCL 1 % IJ SOLN
INTRAMUSCULAR | Status: DC | PRN
  Administered 2020-08-10: 2.5 mL

## 2020-08-10 MED ORDER — ACETAMINOPHEN 10 MG/ML IV SOLN
INTRAVENOUS | Status: DC | PRN
  Administered 2020-08-10: 500 mg via INTRAVENOUS

## 2020-08-10 MED ORDER — PHENYLEPHRINE HCL (PRESSORS) 10 MG/ML IV SOLN
INTRAVENOUS | Status: DC | PRN
  Administered 2020-08-10 (×2): 50 ug via INTRAVENOUS
  Administered 2020-08-10 (×2): 100 ug via INTRAVENOUS
  Administered 2020-08-10: 150 ug via INTRAVENOUS

## 2020-08-10 MED ORDER — BUPIVACAINE-EPINEPHRINE (PF) 0.5% -1:200000 IJ SOLN
INTRAMUSCULAR | Status: AC
  Filled 2020-08-10: qty 30

## 2020-08-10 MED ORDER — DOCUSATE SODIUM 100 MG PO CAPS: 100.0000 mg | ORAL_CAPSULE | Freq: Two times a day (BID) | ORAL | 0 refills | Status: AC | PRN

## 2020-08-10 MED ORDER — PROPOFOL 10 MG/ML IV BOLUS
INTRAVENOUS | Status: DC | PRN
  Administered 2020-08-10: 50 mg via INTRAVENOUS
  Administered 2020-08-10: 20 mg via INTRAVENOUS
  Administered 2020-08-10: 30 mg via INTRAVENOUS

## 2020-08-10 MED ORDER — ACETAMINOPHEN 10 MG/ML IV SOLN
INTRAVENOUS | Status: AC
  Filled 2020-08-10: qty 100

## 2020-08-10 MED ORDER — FENTANYL CITRATE (PF) 100 MCG/2ML IJ SOLN
INTRAMUSCULAR | Status: DC | PRN
  Administered 2020-08-10 (×3): 25 ug via INTRAVENOUS

## 2020-08-10 MED ORDER — CEFAZOLIN SODIUM-DEXTROSE 2-4 GM/100ML-% IV SOLN
INTRAVENOUS | Status: AC
  Filled 2020-08-10: qty 100

## 2020-08-10 MED ORDER — LIDOCAINE HCL (CARDIAC) PF 100 MG/5ML IV SOSY
PREFILLED_SYRINGE | INTRAVENOUS | Status: DC | PRN
  Administered 2020-08-10 (×2): 50 mg via INTRAVENOUS

## 2020-08-10 MED ORDER — FENTANYL CITRATE (PF) 100 MCG/2ML IJ SOLN: 25.0000 ug | INTRAMUSCULAR | Status: DC | PRN

## 2020-08-10 MED ORDER — CHLORHEXIDINE GLUCONATE 0.12 % MT SOLN
OROMUCOSAL | Status: AC
  Administered 2020-08-10: 15 mL via OROMUCOSAL
  Filled 2020-08-10: qty 15

## 2020-08-10 MED ORDER — IBUPROFEN 400 MG PO TABS: 800.0000 mg | ORAL_TABLET | Freq: Three times a day (TID) | ORAL | 0 refills | Status: DC | PRN

## 2020-08-10 MED ORDER — ONDANSETRON HCL 4 MG/2ML IJ SOLN: 4.0000 mg | Freq: Once | INTRAMUSCULAR | Status: DC | PRN

## 2020-08-10 MED ORDER — DEXAMETHASONE SODIUM PHOSPHATE 10 MG/ML IJ SOLN
INTRAMUSCULAR | Status: DC | PRN
  Administered 2020-08-10: 4 mg via INTRAVENOUS

## 2020-08-10 MED ORDER — EPHEDRINE 5 MG/ML INJ
INTRAVENOUS | Status: AC
  Filled 2020-08-10: qty 10

## 2020-08-10 MED ORDER — SODIUM CHLORIDE 0.9 % IV SOLN: INTRAVENOUS | Status: DC | PRN

## 2020-08-10 MED ORDER — FENTANYL CITRATE (PF) 100 MCG/2ML IJ SOLN
INTRAMUSCULAR | Status: AC
  Filled 2020-08-10: qty 2

## 2020-08-10 MED ORDER — SODIUM CHLORIDE 0.9 % IV SOLN: Freq: Once | INTRAVENOUS | Status: AC

## 2020-08-10 MED ORDER — EPHEDRINE SULFATE 50 MG/ML IJ SOLN
INTRAMUSCULAR | Status: DC | PRN
  Administered 2020-08-10: 10 mg via INTRAVENOUS
  Administered 2020-08-10 (×3): 5 mg via INTRAVENOUS
  Administered 2020-08-10: 10 mg via INTRAVENOUS
  Administered 2020-08-10: 5 mg via INTRAVENOUS

## 2020-08-10 MED ORDER — ONDANSETRON HCL 4 MG/2ML IJ SOLN
INTRAMUSCULAR | Status: DC | PRN
  Administered 2020-08-10: 4 mg via INTRAVENOUS

## 2020-08-10 MED ORDER — LIDOCAINE HCL (PF) 1 % IJ SOLN
INTRAMUSCULAR | Status: AC
  Filled 2020-08-10: qty 30

## 2020-08-10 MED ORDER — BUPIVACAINE-EPINEPHRINE 0.5% -1:200000 IJ SOLN
INTRAMUSCULAR | Status: DC | PRN
  Administered 2020-08-10: 2.5 mL

## 2020-08-10 MED ORDER — TRAMADOL HCL 50 MG PO TABS: 50.0000 mg | ORAL_TABLET | Freq: Four times a day (QID) | ORAL | 0 refills | Status: DC | PRN

## 2020-08-10 SURGICAL SUPPLY — 44 items
APPLIER CLIP 11 MED OPEN (CLIP)
BLADE SURG 15 STRL LF DISP TIS (BLADE) ×1 IMPLANT
BLADE SURG 15 STRL SS (BLADE) ×2
CANISTER SUCT 1200ML W/VALVE (MISCELLANEOUS) ×2 IMPLANT
CHLORAPREP W/TINT 26 (MISCELLANEOUS) ×2 IMPLANT
CLIP APPLIE 11 MED OPEN (CLIP) IMPLANT
CNTNR SPEC 2.5X3XGRAD LEK (MISCELLANEOUS) ×1
CONT SPEC 4OZ STER OR WHT (MISCELLANEOUS) ×1
CONT SPEC 4OZ STRL OR WHT (MISCELLANEOUS) ×1
CONTAINER SPEC 2.5X3XGRAD LEK (MISCELLANEOUS) ×1 IMPLANT
COVER WAND RF STERILE (DRAPES) ×2 IMPLANT
DERMABOND ADVANCED (GAUZE/BANDAGES/DRESSINGS) ×1
DERMABOND ADVANCED .7 DNX12 (GAUZE/BANDAGES/DRESSINGS) ×1 IMPLANT
DEVICE DSSCT PLSMBLD 3.0S LGHT (MISCELLANEOUS) ×1 IMPLANT
DEVICE DUBIN SPECIMEN MAMMOGRA (MISCELLANEOUS) ×2 IMPLANT
DRAPE LAPAROTOMY 77X122 PED (DRAPES) ×2 IMPLANT
ELECT CAUTERY BLADE TIP 2.5 (TIP) ×2
ELECT REM PT RETURN 9FT ADLT (ELECTROSURGICAL) ×2
ELECTRODE CAUTERY BLDE TIP 2.5 (TIP) ×1 IMPLANT
ELECTRODE REM PT RTRN 9FT ADLT (ELECTROSURGICAL) ×1 IMPLANT
GLOVE SURG SYN 6.5 ES PF (GLOVE) ×4 IMPLANT
GLOVE SURG UNDER POLY LF SZ7 (GLOVE) ×2 IMPLANT
GOWN STRL REUS W/ TWL LRG LVL3 (GOWN DISPOSABLE) ×3 IMPLANT
GOWN STRL REUS W/TWL LRG LVL3 (GOWN DISPOSABLE) ×6
KIT MARKER MARGIN INK (KITS) IMPLANT
KIT TURNOVER KIT A (KITS) ×2 IMPLANT
LABEL OR SOLS (LABEL) ×2 IMPLANT
LIGHT WAVEGUIDE WIDE FLAT (MISCELLANEOUS) IMPLANT
MANIFOLD NEPTUNE II (INSTRUMENTS) ×2 IMPLANT
MARKER MARGIN CORRECT CLIP (MARKER) IMPLANT
NEEDLE HYPO 22GX1.5 SAFETY (NEEDLE) ×4 IMPLANT
PACK BASIN MINOR ARMC (MISCELLANEOUS) ×2 IMPLANT
PLASMABLADE 3.0S W/LIGHT (MISCELLANEOUS) ×2
SET LOCALIZER 20 PROBE US (MISCELLANEOUS) ×2 IMPLANT
SUT MNCRL 4-0 (SUTURE) ×4
SUT MNCRL 4-0 27XMFL (SUTURE) ×2
SUT SILK 3 0 12 30 (SUTURE) IMPLANT
SUT VIC AB 3-0 SH 27 (SUTURE) ×4
SUT VIC AB 3-0 SH 27X BRD (SUTURE) ×2 IMPLANT
SUTURE MNCRL 4-0 27XMF (SUTURE) ×2 IMPLANT
SYR 20ML LL LF (SYRINGE) ×2 IMPLANT
SYR BULB IRRIG 60ML STRL (SYRINGE) ×2 IMPLANT
TUBING CONNECTING 10 (TUBING) ×2 IMPLANT
WATER STERILE IRR 1000ML POUR (IV SOLUTION) ×2 IMPLANT

## 2020-08-10 NOTE — Anesthesia Procedure Notes (Signed)
Procedure Name: LMA Insertion Date/Time: 08/10/2020 10:56 AM Performed by: Lia Foyer, CRNA Pre-anesthesia Checklist: Patient identified, Emergency Drugs available, Suction available and Patient being monitored Patient Re-evaluated:Patient Re-evaluated prior to induction Oxygen Delivery Method: Circle system utilized Preoxygenation: Pre-oxygenation with 100% oxygen Induction Type: IV induction Ventilation: Mask ventilation without difficulty LMA: LMA inserted LMA Size: 3.0 Tube type: Oral Number of attempts: 1 Placement Confirmation: positive ETCO2 and breath sounds checked- equal and bilateral Tube secured with: Tape Dental Injury: Teeth and Oropharynx as per pre-operative assessment

## 2020-08-10 NOTE — Discharge Instructions (Signed)
Removal, Care After This sheet gives you information about how to care for yourself after your procedure. Your health care provider may also give you more specific instructions. If you have problems or questions, contact your health care provider. What can I expect after the procedure? After the procedure, it is common to have:  Soreness.  Bruising.  Itching. Follow these instructions at home: site care Follow instructions from your health care provider about how to take care of your site. Make sure you:  Wash your hands with soap and water before and after you change your bandage (dressing). If soap and water are not available, use hand sanitizer.  Leave stitches (sutures), skin glue, or adhesive strips in place. These skin closures may need to stay in place for 2 weeks or longer. If adhesive strip edges start to loosen and curl up, you may trim the loose edges. Do not remove adhesive strips completely unless your health care provider tells you to do that.  If the area bleeds or bruises, apply gentle pressure for 10 minutes.  OK TO SHOWER IN 24HRS  Check your site every day for signs of infection. Check for:  Redness, swelling, or pain.  Fluid or blood.  Warmth.  Pus or a bad smell.  General instructions  Rest and then return to your normal activities as told by your health care provider.  RESUME ASPIRIN IN 48HRS .  tylenol and advil as needed for discomfort.  Please alternate between the two every four hours as needed for pain.   .  Use narcotics, if prescribed, only when tylenol and motrin is not enough to control pain. .  325-650mg  every 8hrs to max of 3000mg /24hrs (including the 325mg  in every norco dose) for the tylenol.   .  Advil up to 800mg  per dose every 8hrs as needed for pain.    Keep all follow-up visits as told by your health care provider. This is important. Contact a health care provider if:  You have redness, swelling, or pain around your site.  You  have fluid or blood coming from your site.  Your site feels warm to the touch.  You have pus or a bad smell coming from your site.  You have a fever.  Your sutures, skin glue, or adhesive strips loosen or come off sooner than expected. Get help right away if:  You have bleeding that does not stop with pressure or a dressing. Summary  After the procedure, it is common to have some soreness, bruising, and itching at the site.  Follow instructions from your health care provider about how to take care of your site.  Check your site every day for signs of infection.  Contact a health care provider if you have redness, swelling, or pain around your site, or your site feels warm to the touch.  Keep all follow-up visits as told by your health care provider. This is important. This information is not intended to replace advice given to you by your health care provider. Make sure you discuss any questions you have with your health care provider. Document Released: 05/26/2015 Document Revised: 10/27/2017 Document Reviewed: 10/27/2017 Elsevier Interactive Patient Education  2019 [place]   1) The drugs that you were given will stay in your system until tomorrow so for the next 24 hours you should not:  A) Drive an automobile B) Make any legal decisions C) Drink any alcoholic beverage   2) You may resume  regular meals tomorrow.  Today it is better to start with liquids and gradually work up to solid foods.  You may eat anything you prefer, but it is better to start with liquids, then soup and crackers, and gradually work up to solid foods.   3) Please notify your doctor immediately if you have any unusual bleeding, trouble breathing, redness and pain at the surgery site, drainage, fever, or pain not relieved by medication.  4) Your post-operative visit with Dr.                                     is: Date:                         Time:    Please call to schedule your post-operative visit.  5) Additional Instructions:

## 2020-08-10 NOTE — Anesthesia Preprocedure Evaluation (Signed)
Anesthesia Evaluation  Patient identified by MRN, date of birth, ID band Patient awake    Reviewed: Allergy & Precautions, H&P , NPO status , Patient's Chart, lab work & pertinent test results  History of Anesthesia Complications Negative for: history of anesthetic complications  Airway Mallampati: III  TM Distance: <3 FB Neck ROM: limited    Dental no notable dental hx. (+) Chipped   Pulmonary asthma , neg sleep apnea,   Possible OSA before but borderline, patient was told she didn't need a cpap. Now she thinks her breathing is better at night   Pulmonary exam normal breath sounds clear to auscultation       Cardiovascular Exercise Tolerance: Good + CAD  Normal cardiovascular exam+ Valvular Problems/Murmurs MVP  Rhythm:Regular Rate:Normal - Systolic murmurs 3220 stress test:  The left ventricular ejection fraction is hyperdynamic (>65%).  Nuclear stress EF: 69%.  There was no ST segment deviation noted during stress.  The study is normal.   1. EF 69%, normal wall motion.  2. No evidence for ischemia or infarction.   Normal study.     Neuro/Psych PSYCHIATRIC DISORDERS Anxiety Depression negative neurological ROS     GI/Hepatic Neg liver ROS, hiatal hernia, PUD, GERD  Medicated and Controlled,  Endo/Other  negative endocrine ROS  Renal/GU Renal disease     Musculoskeletal  (+) Arthritis , S/p c-spine surgery   Abdominal   Peds  Hematology negative hematology ROS (+)   Anesthesia Other Findings Past Medical History: No date: Allergic rhinitis No date: Allergy No date: Anemia     Comment:  in childhood No date: Arthritis No date: Asthma No date: Atrophic vaginitis No date: Cataract No date: Diverticulitis No date: Diverticulosis No date: Duodenal ulcer No date: Dyspareunia 09/2012, 10/2012: Dysplastic nevus     Comment:  right thigh, severe atypia, resected 10/2012 by Dr Jarome Matin No date: Esophagitis     Comment:  grade 1 No date: Eustachian tube dysfunction No date: GERD (gastroesophageal reflux disease) No date: Hiatal hernia 02/2009: Hx of adenomatous colonic polyps     Comment:  Tubular adenoma in sigmoid. Diminutive.  No date: Hyperlipemia No date: MVP (mitral valve prolapse) No date: Osteopenia 1995: Schatzki's ring 10/2004: Sleep apnea     Comment:  Sleep study by Dr Gwenette Greet. does not need cpap No date: Vertigo  Past Surgical History: 2001: ABDOMINAL HYSTERECTOMY     Comment:  with anterior and posterior vaginal repair 1989: APPENDECTOMY     Comment:  ruptured 1969: BREAST BIOPSY; Right     Comment:  benign 01/2004: CERVICAL DISCECTOMY     Comment:  C3-4, C5-6, C6-7 04/2006: depuytren's contraction; Right 10/2012: dysplastic nevus     Comment:  right thigh 06/04/2013: ESOPHAGOGASTRODUODENOSCOPY; N/A     Comment:  Procedure: ESOPHAGOGASTRODUODENOSCOPY (EGD);  Surgeon:               Lafayette Dragon, MD;  Location: [facility];  Service:               Endoscopy;  Laterality: N/A; 04/2000: LAPAROSCOPY     Comment:  with lysis of adhesions 03/2013: right shoulder fracture; Right     Comment:  non-displaced, humeral head, non-operative mgt but she               developed frozen shoulder.  1976: SEPTOPLASTY 01/2004: SHOULDER SURGERY; Left     Comment:  release of adhesive  capsulitis 1947: TONSILLECTOMY  BMI    Body Mass Index: 21.03 kg/m      Reproductive/Obstetrics negative OB ROS                             Anesthesia Physical  Anesthesia Plan  ASA: III  Anesthesia Plan: General   Post-op Pain Management:    Induction: Intravenous  PONV Risk Score and Plan: 3 and Dexamethasone, Ondansetron and Treatment may vary due to age or medical condition  Airway Management Planned: LMA  Additional Equipment: None  Intra-op Plan:   Post-operative Plan: Extubation in OR  Informed Consent: I have reviewed the  patients History and Physical, chart, labs and discussed the procedure including the risks, benefits and alternatives for the proposed anesthesia with the patient or authorized representative who has indicated his/her understanding and acceptance.     Dental Advisory Given  Plan Discussed with: CRNA and Surgeon  Anesthesia Plan Comments: (Discussed risks of anesthesia with patient, including possibility of difficulty with spontaneous ventilation under anesthesia necessitating airway intervention, PONV, and rare risks such as cardiac or respiratory or neurological events. Patient understands.)        Anesthesia Quick Evaluation

## 2020-08-10 NOTE — Interval H&P Note (Signed)
History and Physical Interval Note:  08/10/2020 10:13 AM  Karla Roman  has presented today for surgery, with the diagnosis of Lump or mass in breast.  The various methods of treatment have been discussed with the patient and family. After consideration of risks, benefits and other options for treatment, the patient has consented to  Procedure(s): BREAST BIOPSY (Left) as a surgical intervention.  The patient's history has been reviewed, patient examined, no change in status, stable for surgery.  I have reviewed the patient's chart and labs.  Questions were answered to the patient's satisfaction.     [name] [name]

## 2020-08-10 NOTE — Anesthesia Postprocedure Evaluation (Signed)
Anesthesia Post Note  Patient: Karla Roman  Procedure(s) Performed: BREAST BIOPSY (Left )  Patient location during evaluation: PACU Anesthesia Type: General Level of consciousness: awake and alert Pain management: pain level controlled Vital Signs Assessment: post-procedure vital signs reviewed and stable Respiratory status: spontaneous breathing, nonlabored ventilation, respiratory function stable and patient connected to nasal cannula oxygen Cardiovascular status: blood pressure returned to baseline and stable Postop Assessment: no apparent nausea or vomiting Anesthetic complications: no   No complications documented.   Last Vitals:  Vitals:   08/10/20 1328 08/10/20 1349  BP: (!) 146/60 (!) 140/55  Pulse: 80 72  Resp: 16 14  Temp: (!) 36.2 C 36.8 C  SpO2: 97% 98%    Last Pain:  Vitals:   08/10/20 1349  TempSrc: Temporal  PainSc: 0-No pain                 Arita Miss

## 2020-08-10 NOTE — Transfer of Care (Signed)
Immediate Anesthesia Transfer of Care Note  Patient: Karla Roman  Procedure(s) Performed: BREAST BIOPSY (Left )  Patient Location: PACU  Anesthesia Type:General  Level of Consciousness: drowsy  Airway & Oxygen Therapy: Patient Spontanous Breathing and Patient connected to face mask oxygen  Post-op Assessment: Report given to RN and Post -op Vital signs reviewed and stable  Post vital signs: Reviewed and stable  Last Vitals:  Vitals Value Taken Time  BP 141/58 08/10/20 1300  Temp 36.4 C 08/10/20 1257  Pulse 76 08/10/20 1301  Resp 14 08/10/20 1301  SpO2 100 % 08/10/20 1301  Vitals shown include unvalidated device data.  Last Pain:  Vitals:   08/10/20 1257  TempSrc:   PainSc: Asleep      Patients Stated Pain Goal: 0 (32/20/25 4270)  Complications: No complications documented.

## 2020-08-10 NOTE — Op Note (Signed)
Preoperative diagnosis: Left breast mass Postoperative diagnosis: Same.   Procedure: RF tag-localized left breast lumpectomy  Anesthesia: LMA  Surgeon: Dr. Benjamine Sprague  Wound Classification: Clean  Indications: Patient is a 80 y.o. female with a nonpalpable left breast mass noted on mammography with core biopsy demonstrating stromal fibrosis, but with persistent irregular area noted on follow-up mammogram.  Decision made to proceed with for excisional biopsy r  Specimen: Left breast mass, anterior lateral margin, anterior margin  Complications: None  Estimated Blood Loss: 49mL  Findings: 1. Specimen mammography shows RF localizer but no biopsy marker.  Additional tissue excised based on mammographic imaging, but biopsy marker was never identified. 2. Pathology call refers gross examination of margins was negative for any pathology 3. No other palpable mass or lymph node identified.   Description of procedure: RF localization was performed by radiology prior to procedure.  The patient was taken to the operating room and placed supine on the operating table, and after general anesthesia the left breast and axilla were prepped and draped in the usual sterile fashion. A time-out was completed verifying correct patient, procedure, site, positioning, and implant(s) and/or special equipment prior to beginning this procedure.  By identifying the RF localizer, the probable trajectory and location of the mass was visualized. A skin incision was planned in such a way as to minimize the amount of dissection to reach the mass.  The skin incision was made after infusion of local. Flaps were raised and  Sharp and blunt dissection was then taken down to the mass, taking care to include the entire RF localizer and a margin of grossly normal tissue. The specimen was removed. The specimen was oriented with paint and sent to radiology with the localization studies.  At this point, biopsy marker was noted to  be absent from the initial specimen.  Based on the location of the biopsy clip in relation to the RF tag on previous mammogram imaging, additional tissue was taken from the anterior lateral and anterior portion to see if the biopsy clips were within this tissue.  Unfortunately, subsequent examination of the additional tissue still did not yield the biopsy marker.  Additional tissue resection at this point will be beyond a typical lumpectomy procedure that the patient originally consented for, therefore the procedure was aborted at this time.  The final anterior margin specimen was noted to have some previous hemorrhaging per pathology exam.  Wound irrigated, hemostasis was achieved and the wound closed in layers with  interrupted sutures of 3-0 Vicryl in deep dermal layer and a running subcuticular suture of Monocryl 4-0, then dressed with dermabond. The patient tolerated the procedure well and was taken to the postanesthesia care unit in stable condition. Sponge and instrument count correct at end of procedure.

## 2020-08-14 LAB — SURGICAL PATHOLOGY

## 2020-10-20 ENCOUNTER — Ambulatory Visit: Payer: Medicare Other | Admitting: Cardiovascular Disease

## 2020-10-20 ENCOUNTER — Encounter: Payer: Self-pay | Admitting: Cardiovascular Disease

## 2020-10-20 ENCOUNTER — Other Ambulatory Visit: Payer: Self-pay

## 2020-10-20 VITALS — BP 128/60 | HR 65 | Ht 62.0 in | Wt 112.5 lb

## 2020-10-20 DIAGNOSIS — I2510 Atherosclerotic heart disease of native coronary artery without angina pectoris: Secondary | ICD-10-CM | POA: Diagnosis not present

## 2020-10-20 DIAGNOSIS — E785 Hyperlipidemia, unspecified: Secondary | ICD-10-CM

## 2020-10-20 NOTE — Patient Instructions (Signed)

## 2020-10-20 NOTE — Progress Notes (Signed)
Cardiology Office Note   Date:  10/20/2020   ID:  Karla Roman, DOB 12-18-1940, MRN [id number]  PCP:  Leonel Ramsay, MD  Cardiologist:  Fletcher Anon  Chief Complaint  Patient presents with   Other    12 month f/u no complaints today. Meds reviewed verbally with pt.       History of Present Illness: Karla Roman is a 80 y.o. female who is here today for a follow-up visit regarding coronary atherosclerosis and hyperlipidemia. She had previous atypical chest pain with negative nuclear stress testing in 2018.  Other chronic medical conditions include GERD, irritable bowel syndrome and hyperlipidemia. No history of diabetes or hypertension. She is a lifelong nonsmoker.  Echocardiogram in 2017 showed normal LV systolic function, moderately thickened mitral valve leaflets and mild mitral regurgitation.   She is known to have three-vessel coronary artery disease calcifications on previous CT scan.  She has been doing very well no recent chest pain, shortness of breath or palpitations.  She reports that her husband's dementia has stabilized.   Past Medical History:  Diagnosis Date   Allergic rhinitis    Allergy    Anemia    in childhood   Anxiety    Arthritis    Asthma    Atrophic vaginitis    Cataract    Depression    Diverticulitis    Diverticulosis    Duodenal ulcer    Dyspareunia    Dysplastic nevus 09/2012, 10/2012   right thigh, severe atypia, resected 10/2012 by Dr Jarome Matin   Esophagitis    grade 1   Eustachian tube dysfunction    GERD (gastroesophageal reflux disease)    Hiatal hernia    Hx of adenomatous colonic polyps 02/2009   Tubular adenoma in sigmoid. Diminutive.    Hyperlipemia    MVP (mitral valve prolapse)    Osteopenia    Pneumonia    Schatzki's ring 1995   Sleep apnea 10/2004   Sleep study by Dr Gwenette Greet. does not need cpap   Vertigo     Past Surgical History:  Procedure Laterality Date   ABDOMINAL HYSTERECTOMY  2001   with anterior  and posterior vaginal repair   APPENDECTOMY  1989   ruptured   BREAST BIOPSY Right 1969   benign   BREAST BIOPSY Left 01/11/2020   distortion, x marker, BENIGN BREAST TISSUE WITH DENSE STROMAL FIBROSIS. - NEGATIVE FOR ATYPIA AND MALIGNANCY   BREAST BIOPSY Left 08/10/2020   Procedure: BREAST BIOPSY;  Surgeon: Benjamine Sprague, DO;  Location: [facility] ORS;  Service: General;  Laterality: Left;   BREAST LUMPECTOMY Left    CERVICAL DISCECTOMY  01/2004   C3-4, C5-6, C6-7   COLONOSCOPY WITH PROPOFOL N/A 02/01/2020   Procedure: COLONOSCOPY WITH PROPOFOL;  Surgeon: Lesly Rubenstein, MD;  Location: [facility] ENDOSCOPY;  Service: Endoscopy;  Laterality: N/A;   depuytren's contraction Right 04/2006   dysplastic nevus  10/2012   right thigh   ESOPHAGOGASTRODUODENOSCOPY N/A 06/04/2013   Procedure: ESOPHAGOGASTRODUODENOSCOPY (EGD);  Surgeon: Lafayette Dragon, MD;  Location: [facility] ENDOSCOPY;  Service: Endoscopy;  Laterality: N/A;   LAPAROSCOPY  04/2000   with lysis of adhesions   OPEN REDUCTION INTERNAL FIXATION (ORIF) DISTAL RADIAL FRACTURE Left 11/11/2019   Procedure: OPEN REDUCTION INTERNAL FIXATION (ORIF) DISTAL RADIAL FRACTURE;  Surgeon: Hessie Knows, MD;  Location: [facility] ORS;  Service: Orthopedics;  Laterality: Left;   right shoulder fracture Right 03/2013   non-displaced, humeral head, non-operative mgt but she developed frozen  shoulder.    SEPTOPLASTY  1976   SHOULDER SURGERY Left 01/2004   release of adhesive capsulitis   TONSILLECTOMY  1947     Current Outpatient Medications  Medication Sig Dispense Refill   acetaminophen (TYLENOL) 500 MG tablet Take 500 mg by mouth every 4 (four) hours as needed for mild pain or headache.     Ascorbic Acid (VITAMIN C) 500 MG CAPS Chew 500 mg by mouth daily.     aspirin EC 81 MG tablet Take 1 tablet (81 mg total) by mouth daily. 90 tablet 1   atorvastatin (LIPITOR) 20 MG tablet TAKE 1 TABLET BY MOUTH  DAILY 90 tablet 1   Biotin 5000 MCG TABS Take 5,000 mcg by mouth  daily.     Ca Phosphate-Cholecalciferol (CALTRATE GUMMY BITES PO) Take 600 mg by mouth daily.     CANNABIDIOL PO Place 500 mg under the tongue at bedtime. CBD Oil  Hemp     Cholecalciferol 250 MCG (10000 UT) CAPS Take 1,000 Units by mouth daily.     citalopram (CELEXA) 10 MG tablet Take 10 mg by mouth daily.     Glucosamine-Chondroitin (COSAMIN DS PO) Take 1 tablet by mouth in the morning and at bedtime.     Lidocaine 4 % GEL Apply 1 application topically daily as needed (Pain). Roll-on     Multiple Vitamin (MULTIVITAMIN WITH MINERALS) TABS tablet Take 1 tablet by mouth daily.     Omega 3 1000 MG CAPS Take 1 g by mouth at bedtime.     pantoprazole (PROTONIX) 40 MG tablet TAKE 1 TABLET BY MOUTH  DAILY BEFORE BREAKFAST 90 tablet 1   psyllium (METAMUCIL) 58.6 % packet Take 0.5 packets by mouth at bedtime.     saccharomyces boulardii (FLORASTOR) 250 MG capsule Take 250 mg by mouth daily.     vitamin E 180 MG (400 UNITS) capsule Take 400 Units by mouth daily.     No current facility-administered medications for this visit.    Allergies:   Tramadol, Codeine, Levaquin [levofloxacin], and Oxycodone    Social History:  The patient  reports that she has never smoked. She has never used smokeless tobacco. She reports that she does not drink alcohol and does not use drugs.   Family History:  The patient's family history includes Breast cancer in her maternal aunt; Cancer in her maternal aunt; Coronary artery disease in her brother, brother, father, and mother; Diabetes in her brother and brother; Heart attack in her father; Hypertension in her brother and sister; Hyperthyroidism in her sister; Irritable bowel syndrome in her sister.    ROS:  Please see the history of present illness.   Otherwise, review of systems are positive for none.   All other systems are reviewed and negative.    PHYSICAL EXAM: VS:  BP 128/60 (BP Location: Left Arm, Patient Position: Sitting, Cuff Size: Normal)   Pulse 65    Ht 5\' 2"  (1.575 m)   Wt 112 lb 8 oz (51 kg)   SpO2 98%   BMI 20.58 kg/m  , BMI Body mass index is 20.58 kg/m. GEN: Well nourished, well developed, in no acute distress  HEENT: normal  Neck: no JVD, carotid bruits, or masses Cardiac: RRR; no murmurs, rubs, or gallops,no edema  Respiratory:  clear to auscultation bilaterally, normal work of breathing GI: soft, nontender, nondistended, + BS MS: no deformity or atrophy  Skin: warm and dry, no rash Neuro:  Strength and sensation are intact Psych: euthymic mood,  full affect   EKG:  EKG is ordered today. EKG today was reviewed and showed normal sinus rhythm withv no significant ST or T wave changes.  Recent Labs: 08/10/2020: BUN 15; Creatinine, Ser 0.90; Hemoglobin 12.2; Potassium 3.9; Sodium 140    Lipid Panel    Component Value Date/Time   CHOL 168 01/13/2019 1049   TRIG 56.0 01/13/2019 1049   HDL 96.00 01/13/2019 1049   CHOLHDL 2 01/13/2019 1049   VLDL 11.2 01/13/2019 1049   LDLCALC 60 01/13/2019 1049   LDLDIRECT 115.1 12/23/2012 1015      Wt Readings from Last 3 Encounters:  10/20/20 112 lb 8 oz (51 kg)  08/10/20 106 lb 0.7 oz (48.1 kg)  02/01/20 106 lb (48.1 kg)      No flowsheet data found.    ASSESSMENT AND PLAN:  1. Coronary atherosclerosis noted on CT scan: No symptoms to suggest angina. Continue with healthy lifestyle changes in medical therapy of risk factors.  Continue low-dose aspirin.   2. Hyperlipidemia: Continue small dose atorvastatin.  I reviewed most recent lipid profile with her which was done in May and showed an LDL of 62 and triglyceride of 63.  Both at target.   Disposition:   FU with me in 1 year.   Signed,  Kathlyn [place], MD  10/20/2020 8:41 AM    [place]

## 2020-12-20 ENCOUNTER — Other Ambulatory Visit: Payer: Self-pay | Admitting: Infectious Diseases

## 2020-12-20 DIAGNOSIS — Z1231 Encounter for screening mammogram for malignant neoplasm of breast: Secondary | ICD-10-CM

## 2021-01-03 ENCOUNTER — Other Ambulatory Visit: Payer: Self-pay

## 2021-01-03 ENCOUNTER — Ambulatory Visit
Admission: RE | Admit: 2021-01-03 | Discharge: 2021-01-03 | Disposition: A | Discharge status: Home / Self Care | Payer: Medicare Other | Source: Ambulatory Visit | Attending: Infectious Diseases | Admitting: Infectious Diseases

## 2021-01-03 DIAGNOSIS — Z1231 Encounter for screening mammogram for malignant neoplasm of breast: Secondary | ICD-10-CM | POA: Insufficient documentation

## 2021-02-09 ENCOUNTER — Other Ambulatory Visit: Payer: Self-pay | Admitting: Surgery

## 2021-02-09 DIAGNOSIS — N6321 Unspecified lump in the left breast, upper outer quadrant: Secondary | ICD-10-CM

## 2021-03-23 ENCOUNTER — Other Ambulatory Visit: Payer: Self-pay

## 2021-03-23 ENCOUNTER — Ambulatory Visit
Admission: RE | Admit: 2021-03-23 | Discharge: 2021-03-23 | Disposition: A | Discharge status: Home / Self Care | Payer: Medicare Other | Source: Ambulatory Visit | Attending: Surgery | Admitting: Surgery

## 2021-03-23 DIAGNOSIS — N6321 Unspecified lump in the left breast, upper outer quadrant: Secondary | ICD-10-CM | POA: Diagnosis present

## 2021-12-03 ENCOUNTER — Other Ambulatory Visit: Payer: Self-pay | Admitting: Infectious Diseases

## 2021-12-03 DIAGNOSIS — Z1231 Encounter for screening mammogram for malignant neoplasm of breast: Secondary | ICD-10-CM

## 2022-01-07 ENCOUNTER — Ambulatory Visit
Admission: RE | Admit: 2022-01-07 | Discharge: 2022-01-07 | Disposition: A | Discharge status: Home / Self Care | Payer: Medicare Other | Source: Ambulatory Visit | Attending: Infectious Diseases | Admitting: Infectious Diseases

## 2022-01-07 DIAGNOSIS — Z1231 Encounter for screening mammogram for malignant neoplasm of breast: Secondary | ICD-10-CM | POA: Insufficient documentation

## 2022-02-14 ENCOUNTER — Ambulatory Visit: Payer: Medicare Other | Attending: Cardiovascular Disease | Admitting: Cardiovascular Disease

## 2022-02-14 ENCOUNTER — Encounter: Payer: Self-pay | Admitting: Cardiovascular Disease

## 2022-02-14 VITALS — BP 138/60 | HR 69 | Ht 62.0 in | Wt 126.4 lb

## 2022-02-14 DIAGNOSIS — E785 Hyperlipidemia, unspecified: Secondary | ICD-10-CM

## 2022-02-14 DIAGNOSIS — I2510 Atherosclerotic heart disease of native coronary artery without angina pectoris: Secondary | ICD-10-CM

## 2022-02-14 NOTE — Patient Instructions (Signed)
Medication Instructions:  Your physician recommends that you continue on your current medications as directed. Please refer to the Current Medication list given to you today.  *If you need a refill on your cardiac medications before your next appointment, please call your pharmacy*   Lab Work: None ordered If you have labs (blood work) drawn today and your tests are completely normal, you will receive your results only by: MyChart Message (if you have MyChart) OR A paper copy in the mail If you have any lab test that is abnormal or we need to change your treatment, we will call you to review the results.   Testing/Procedures: None ordered   Follow-Up: At  [facility], you and your health needs are our priority.  As part of our continuing mission to provide you with exceptional heart care, we have created designated Provider Care Teams.  These Care Teams include your primary Cardiologist (physician) and Advanced Practice Providers (APPs -  Physician Assistants and Nurse Practitioners) who all work together to provide you with the care you need, when you need it.  We recommend signing up for the patient portal called "MyChart".  Sign up information is provided on this After Visit Summary.  MyChart is used to connect with patients for Virtual Visits (Telemedicine).  Patients are able to view lab/test results, encounter notes, upcoming appointments, etc.  Non-urgent messages can be sent to your provider as well.   To learn more about what you can do with MyChart, go to [email or web address]://[email or web address].com.    Your next appointment:   Your physician wants you to follow-up in: 1 year You will receive a reminder letter in the mail two months in advance. If you don't receive a letter, please call our office to schedule the follow-up appointment.   The format for your next appointment:   In Person  Provider:   You may see Muhammad Arida, MD or one of the following Advanced Practice Providers on  your designated Care Team:   Christopher Berge, NP Ryan Dunn, PA-C Cadence Furth, PA-C Sheri Hammock, NP    Other Instructions N/A  Important Information About Sugar       

## 2022-02-14 NOTE — Progress Notes (Signed)
Cardiology Office Note   Date:  02/14/2022   ID:  Karla Roman, DOB 1940/09/25, MRN [id number]  PCP:  Leonel Ramsay, MD  Cardiologist:  Fletcher Anon  Chief Complaint  Patient presents with   Other    12 Month f/u no complaints today. Meds reviewed verbally with pt.       History of Present Illness: Karla Roman is a 81 y.o. female who is here today for a follow-up visit regarding coronary atherosclerosis and hyperlipidemia. She had previous atypical chest pain with negative nuclear stress testing in 2018.  Other chronic medical conditions include GERD, irritable bowel syndrome and hyperlipidemia. No history of diabetes or hypertension. She is a lifelong nonsmoker.  Echocardiogram in 2017 showed normal LV systolic function, moderately thickened mitral valve leaflets and mild mitral regurgitation.   She is known to have three-vessel coronary artery disease calcifications and aortic calcifications on previous CT scan.  She has been doing very well no recent chest pain, shortness of breath or palpitations.  She is the caregiver of her husband who has dementia.   Past Medical History:  Diagnosis Date   Allergic rhinitis    Allergy    Anemia    in childhood   Anxiety    Arthritis    Asthma    Atrophic vaginitis    Cataract    Depression    Diverticulitis    Diverticulosis    Duodenal ulcer    Dyspareunia    Dysplastic nevus 09/2012, 10/2012   right thigh, severe atypia, resected 10/2012 by Dr Jarome Matin   Esophagitis    grade 1   Eustachian tube dysfunction    GERD (gastroesophageal reflux disease)    Hiatal hernia    Hx of adenomatous colonic polyps 02/2009   Tubular adenoma in sigmoid. Diminutive.    Hyperlipemia    MVP (mitral valve prolapse)    Osteopenia    Pneumonia    Schatzki's ring 1995   Sleep apnea 10/2004   Sleep study by Dr Gwenette Greet. does not need cpap   Vertigo     Past Surgical History:  Procedure Laterality Date   ABDOMINAL  HYSTERECTOMY  2001   with anterior and posterior vaginal repair   APPENDECTOMY  1989   ruptured   BREAST BIOPSY Right 1969   benign   BREAST BIOPSY Left 01/11/2020   distortion, x marker, BENIGN BREAST TISSUE WITH DENSE STROMAL FIBROSIS. - NEGATIVE FOR ATYPIA AND MALIGNANCY   BREAST BIOPSY Left 08/10/2020   Procedure: BREAST BIOPSY;  Surgeon: Benjamine Sprague, DO;  Location: [facility] ORS;  Service: General;  Laterality: Left;   BREAST EXCISIONAL BIOPSY Left 08/10/2020   neg   CERVICAL DISCECTOMY  01/2004   C3-4, C5-6, C6-7   COLONOSCOPY WITH PROPOFOL N/A 02/01/2020   Procedure: COLONOSCOPY WITH PROPOFOL;  Surgeon: Lesly Rubenstein, MD;  Location: [facility] ENDOSCOPY;  Service: Endoscopy;  Laterality: N/A;   depuytren's contraction Right 04/2006   dysplastic nevus  10/2012   right thigh   ESOPHAGOGASTRODUODENOSCOPY N/A 06/04/2013   Procedure: ESOPHAGOGASTRODUODENOSCOPY (EGD);  Surgeon: Lafayette Dragon, MD;  Location: [facility] END[facility];  Service: Endoscopy;  Laterality: N/A;   LAPAROSCOPY  04/2000   with lysis of adhesions   OPEN REDUCTION INTERNAL FIXATION (ORIF) DISTAL RADIAL FRACTURE Left 11/11/2019   Procedure: OPEN REDUCTION INTERNAL FIXATION (ORIF) DISTAL RADIAL FRACTURE;  Surgeon: Hessie Knows, MD;  Location: [facility] ORS;  Service: Orthopedics;  Laterality: Left;   right shoulder fracture Right 03/2013  non-displaced, humeral head, non-operative mgt but she developed frozen shoulder.    SEPTOPLASTY  1976   SHOULDER SURGERY Left 01/2004   release of adhesive capsulitis   TONSILLECTOMY  1947     Current Outpatient Medications  Medication Sig Dispense Refill   acetaminophen (TYLENOL) 500 MG tablet Take 500 mg by mouth every 4 (four) hours as needed for mild pain or headache.     Ascorbic Acid (VITAMIN C) 500 MG CAPS Chew 500 mg by mouth daily.     aspirin EC 81 MG tablet Take 1 tablet (81 mg total) by mouth daily. 90 tablet 1   atorvastatin (LIPITOR) 20 MG tablet TAKE 1 TABLET BY MOUTH  DAILY  90 tablet 1   Biotin 5000 MCG TABS Take 5,000 mcg by mouth daily.     Ca Phosphate-Cholecalciferol (CALTRATE GUMMY BITES PO) Take 600 mg by mouth daily.     CANNABIDIOL PO Place 500 mg under the tongue at bedtime. CBD Oil  Hemp     Cholecalciferol 250 MCG (10000 UT) CAPS Take 1,000 Units by mouth daily.     citalopram (CELEXA) 10 MG tablet Take 10 mg by mouth daily.     Glucosamine-Chondroitin (COSAMIN DS PO) Take 1 tablet by mouth in the morning and at bedtime.     Lidocaine 4 % GEL Apply 1 application topically daily as needed (Pain). Roll-on     Multiple Vitamin (MULTIVITAMIN WITH MINERALS) TABS tablet Take 1 tablet by mouth daily.     Omega 3 1000 MG CAPS Take 1 g by mouth at bedtime.     pantoprazole (PROTONIX) 40 MG tablet TAKE 1 TABLET BY MOUTH  DAILY BEFORE BREAKFAST 90 tablet 1   psyllium (METAMUCIL) 58.6 % packet Take 0.5 packets by mouth at bedtime.     saccharomyces boulardii (FLORASTOR) 250 MG capsule Take 250 mg by mouth daily.     vitamin E 180 MG (400 UNITS) capsule Take 400 Units by mouth daily.     No current facility-administered medications for this visit.    Allergies:   Tramadol, Codeine, Levaquin [levofloxacin], and Oxycodone    Social History:  The patient  reports that she has never smoked. She has never used smokeless tobacco. She reports that she does not drink alcohol and does not use drugs.   Family History:  The patient's family history includes Breast cancer in her maternal aunt; Cancer in her maternal aunt; Coronary artery disease in her brother, brother, father, and mother; Diabetes in her brother and brother; Heart attack in her father; Hypertension in her brother and sister; Hyperthyroidism in her sister; Irritable bowel syndrome in her sister.    ROS:  Please see the history of present illness.   Otherwise, review of systems are positive for none.   All other systems are reviewed and negative.    PHYSICAL EXAM: VS:  BP 138/60 (BP Location: Left Arm,  Patient Position: Sitting, Cuff Size: Normal)   Pulse 69   Ht '5\' 2"'$  (1.575 m)   Wt 126 lb 6 oz (57.3 kg)   SpO2 98%   BMI 23.11 kg/m  , BMI Body mass index is 23.11 kg/m. GEN: Well nourished, well developed, in no acute distress  HEENT: normal  Neck: no JVD, carotid bruits, or masses Cardiac: RRR; no murmurs, rubs, or gallops,no edema  Respiratory:  clear to auscultation bilaterally, normal work of breathing GI: soft, nontender, nondistended, + BS MS: no deformity or atrophy  Skin: warm and dry, no rash Neuro:  Strength and sensation are intact Psych: euthymic mood, full affect   EKG:  EKG is ordered today. EKG today was reviewed and showed normal sinus rhythm withv no significant ST or T wave changes.  Recent Labs: No results found for requested labs within last 365 days.    Lipid Panel    Component Value Date/Time   CHOL 168 01/13/2019 1049   TRIG 56.0 01/13/2019 1049   HDL 96.00 01/13/2019 1049   CHOLHDL 2 01/13/2019 1049   VLDL 11.2 01/13/2019 1049   LDLCALC 60 01/13/2019 1049   LDLDIRECT 115.1 12/23/2012 1015      Wt Readings from Last 3 Encounters:  02/14/22 126 lb 6 oz (57.3 kg)  10/20/20 112 lb 8 oz (51 kg)  08/10/20 106 lb 0.7 oz (48.1 kg)          No data to display            ASSESSMENT AND PLAN:  1. Coronary atherosclerosis noted on CT scan: No symptoms to suggest angina. Continue with healthy lifestyle changes in medical therapy of risk factors.  Continue low-dose aspirin.   2. Hyperlipidemia: I reviewed most recent lipid profile done in March of this year which showed an LDL of 67 and triglyceride of 43.  Continue treatment with atorvastatin.  3.  Aortic atherosclerosis: No claudication and no evidence of obstructive disease.   Disposition:   FU with me in 1 year.   Signed,  Kathlyn Sacramento, MD  02/14/2022 3:30 PM    [place]

## 2022-07-16 ENCOUNTER — Ambulatory Visit: Payer: Medicare Other | Attending: Neurology | Admitting: Speech Pathology

## 2022-07-16 ENCOUNTER — Encounter: Payer: Self-pay | Admitting: Speech Pathology

## 2022-07-16 DIAGNOSIS — R41841 Cognitive communication deficit: Secondary | ICD-10-CM | POA: Insufficient documentation

## 2022-07-16 NOTE — Patient Instructions (Addendum)
Our assessment today showed that you are having some difficulty with memory that appears to be more than what is part of "normal" aging. We talked about the possibility of doing speech therapy to work together on some strategies and things you can do in your day-to-day life to improve your function and try to maintain your independence.   You would like to run this by your doctor; that's fine! Your next appointment is Monday, 3/18 at 11am with me at [facility]. If you decide that you do not want to return for therapy, please call [phone] and let the front desk know you have decided not to return.

## 2022-07-16 NOTE — Therapy (Signed)
OUTPATIENT SPEECH LANGUAGE PATHOLOGY EVALUATION   Patient Name: Karla Roman MRN: [id number] DOB:12-Mar-1941, 82 y.o., female Today's Date: 07/18/2022  PCP: Adrian Prows, MD REFERRING PROVIDER: Kerry Dory, [place]   End of Session - 07/18/22 1016     Visit Number 1    Number of Visits 1    Date for SLP Re-Evaluation 07/16/22    SLP Start Time 1502    SLP Stop Time  1600    SLP Time Calculation (min) 58 min    Activity Tolerance Patient tolerated treatment well             No past medical history on file.  Patient Active Problem List   Diagnosis Date Noted   Need for influenza vaccination 01/13/2019   Need for shingles vaccine 01/13/2019   [place] 10/29/2018   Chronic renal disease, stage 3, moderately decreased glomerular filtration rate (GFR) between 30-59 mL/min/1.73 square meter ([place]) 10/29/2018   Primary osteoarthritis involving multiple joints 01/08/2018   CAD (coronary artery disease) 09/10/2016   Steroid-induced osteopenia 07/23/2016   Asthma, mild intermittent 09/05/2015   IBS (irritable bowel syndrome) 08/03/2015   Spinal stenosis of cervical region 10/11/2014   Hyperlipidemia with target LDL less than 130 12/27/2013   Duodenal ulcer, with obstruction 06/28/2013   Renal cyst, right 06/16/2013   Routine general medical examination at a health care facility 12/15/2010   MITRAL VALVE PROLAPSE 12/29/2006   Allergic rhinitis 12/29/2006   GERD 12/29/2006    ONSET DATE: 07/01/2022   REFERRING DIAG: Cognitive impairment  THERAPY DIAG:  Cognitive communication deficit  Rationale for Evaluation and Treatment Rehabilitation  SUBJECTIVE:   SUBJECTIVE STATEMENT:  "Could this have anything to do with my neck?"  Pt accompanied by: self  PERTINENT HISTORY: [name] is an 82 y.o. female with past medical history including caregiver stress, questionable pseudodementia of depression, HLD, CAD, GERD, CKD, arthritis, spinal stenosis, osteopenia  referred for mild cognitive impairment from neurology (22/30 on SL[facility]).    DIAGNOSTIC FINDINGS: MRI Brain 05/01/20" No evidence of acute intracranial abnormality. Mild cerebral white matter chronic small vessel ischemic disease. NeuroQuant volumetric analysis of the brain, see details on [employer]. Briefly, the comparison with age and gender matched reference reveals a whole brain volume normative percentile of 43."  PAIN:  Are you having pain? No   FALLS: Has patient fallen in last 6 months?  No  LIVING ENVIRONMENT: Lives with: lives with their spouse Lives in: House/apartment  PLOF:  Level of assistance: Independent with IADLs Employment: Retired Production manager firm   PATIENT GOALS: none stated  OBJECTIVE:   COGNITIVE COMMUNICATION Overall cognitive status: Impaired Areas of impairment:  Oriented to person Oriented to place  Memory: Impaired: Immediate Working Short term Awareness: Impaired: Intellectual Functional communication: Impaired: difficulty recalling details from appointments   AUDITORY COMPREHENSION: Overall auditory comprehension:  WFL YES/NO questions: Appears intact Following directions:  repetition necessary due to working memory Conversation: Moderately Complex Interfering components: hearing and working Marine scientist; needs repetition/ visual cues for some words, especially high-frequency sounds with masking Effective technique: repetition/stressing words, stressing words, and visual/gestural cues   READING COMPREHENSION: Intact  EXPRESSION: verbal  VERBAL EXPRESSION: Level of generative/spontaneous verbalization: conversation Automatic speech: name: intact and social response: intact  Repetition: Appears intact Naming: Confrontation: noted phonological/semantic errors  and Divergent: 51-75% Pragmatics: Appears intact Non-verbal means of communication: N/A   WRITTEN EXPRESSION: Dominant hand: right  Written expression: Appears  intact  MOTOR SPEECH: Overall motor speech: Appears intact Respiration:  wfl Phonation: normal Resonance: WFL Articulation: Appears intact Intelligibility: Intelligible Motor planning: Appears intact Motor speech errors:  N/A Interfering components:  N/A Effective technique:  N/A  ORAL MOTOR EXAMINATION Facial : WFL Lingual: WFL Velum: WFL Mandible: WFL Cough: WFL Voice: WFL   STANDARDIZED ASSESSMENTS: Addenbrooke's Cognitive Examination - ACE III The Addenbrooke's Cognitive Examination-III (ACE-III) is a brief cognitive test that assesses five cognitive domains. The total score is 100 with higher scores indicating better cognitive functioning. Cut off scores of 88 and 82 are recommended for suspicion of dementia (88 has sensitivity of 1.00 and specificity of 0.96, 82 has sensitivity of 0.93 and specificity of 1.00). American Version B  Attention 12/18  Memory 9/26  Fluency 8/14  Language 22/26  Visuospatial 12/16  TOTAL ACE- III Score 63/100     PATIENT REPORTED OUTCOME MEASURES (PROM):  MULTIFACTORIAL MEMORY QUESTIONNAIRE (MMQ)  Administered patient self-reported outcome measure Multifactorial Memory Questionnaire (MMQ). The Multifactorial Memory Questionnaire [facility]) consists of three scales measuring separate aspects of metamemory; Satisfaction, Ability and Strategy.   Pt's responses are converted to T-Scores with severity levels based on pt's T-Score.   Severity Levels (T-score) Very Low - < 20 Low - 20 to 29 Below Average - 30-39 Average - 40 to 60 Above Average - 60 to 70 High - 71 to 80 Very High - > 80  Pt reports:   Above Average - 60 to 70 Memory Ability (T-score: 69)    TODAY'S TREATMENT:  Educated on role of ST and evaluation findings, recommendations   PATIENT EDUCATION: Education details: memory deficits do not appear normal for age Person educated: Patient Education method: Theatre stage manager Education comprehension: verbalized  understanding and needs further education  GOALS: N/a, pt did not want to pursue therapy   ASSESSMENT:  CLINICAL IMPRESSION: Patient is an 82 y.o. female who was seen today for cognitive-linguistic evaluation. She denies functional impacts in being to perform her day-to-day activities including medication and finance management, and caring for her spouse who has Alzheimer's. No family was present to confirm her functioning level; left message with daughter requesting callback. She is presenting with significant cognitive impairments, including in areas of attention, memory, and language. Her score on the ACE-III was 66/100, which is below the cutoff for suspicion of dementia. Patient did not appear to understand why she had been referred for cognitive assessment, and with education on her performance was unsure whether she wanted to pursue therapy; she decided she would like to discuss with her primary care physician first. Potential benefit from [place] limited by pt's awareness and inability to note any functional impacts, and she is unlikely to benefit pt without participation of family member. If concern for underlying depression/pseudodementia, consider referral to psychology/psychiatry and neuropsychiatry for management and further evaluation.  OBJECTIVE IMPAIRMENTS include memory and expressive language. These impairments are limiting patient from  (patient could not provide examples of functional deficits; contacted daughter and requested callback for more information, but did not receive return call) . Factors affecting potential to achieve goals and functional outcome are ability to learn/carryover information and cooperation/participation level.. Patient will benefit from skilled SLP services to address above impairments and improve overall function.  REHAB POTENTIAL: Fair    PLAN: SLP FREQUENCY: one time visit  SLP DURATION: other: eval only  PLANNED INTERVENTIONS: Patient/family  education    Deneise Lever, MS, Actor [phone]

## 2022-07-18 ENCOUNTER — Encounter: Payer: Medicare Other | Admitting: Speech Pathology

## 2022-07-22 ENCOUNTER — Encounter: Payer: Medicare Other | Admitting: Speech Pathology

## 2022-07-25 ENCOUNTER — Encounter: Payer: Medicare Other | Admitting: Speech Pathology

## 2022-07-29 ENCOUNTER — Ambulatory Visit: Payer: Medicare Other | Admitting: Speech Pathology

## 2022-07-31 ENCOUNTER — Encounter: Payer: Medicare Other | Admitting: Speech Pathology

## 2022-08-05 ENCOUNTER — Encounter: Payer: Medicare Other | Admitting: Speech Pathology

## 2022-08-07 ENCOUNTER — Encounter: Payer: Medicare Other | Admitting: Speech Pathology

## 2022-08-13 ENCOUNTER — Encounter: Payer: Medicare Other | Admitting: Speech Pathology

## 2022-12-06 ENCOUNTER — Other Ambulatory Visit: Payer: Self-pay | Admitting: Infectious Diseases

## 2022-12-06 DIAGNOSIS — Z1231 Encounter for screening mammogram for malignant neoplasm of breast: Secondary | ICD-10-CM

## 2023-01-09 ENCOUNTER — Ambulatory Visit
Admission: RE | Admit: 2023-01-09 | Discharge: 2023-01-09 | Disposition: A | Discharge status: Home / Self Care | Payer: Medicare Other | Source: Ambulatory Visit | Attending: Infectious Diseases | Admitting: Infectious Diseases

## 2023-01-09 DIAGNOSIS — Z1231 Encounter for screening mammogram for malignant neoplasm of breast: Secondary | ICD-10-CM | POA: Diagnosis present

## 2023-02-17 DIAGNOSIS — L308 Other specified dermatitis: Secondary | ICD-10-CM | POA: Diagnosis not present

## 2023-02-17 DIAGNOSIS — Z85820 Personal history of malignant melanoma of skin: Secondary | ICD-10-CM | POA: Diagnosis not present

## 2023-02-17 DIAGNOSIS — D225 Melanocytic nevi of trunk: Secondary | ICD-10-CM | POA: Diagnosis not present

## 2024-02-11 ENCOUNTER — Other Ambulatory Visit: Payer: Self-pay | Admitting: Infectious Diseases

## 2024-02-11 DIAGNOSIS — Z1231 Encounter for screening mammogram for malignant neoplasm of breast: Secondary | ICD-10-CM

## 2024-03-10 ENCOUNTER — Ambulatory Visit
Admission: RE | Admit: 2024-03-10 | Discharge: 2024-03-10 | Disposition: A | Discharge status: Home / Self Care | Source: Ambulatory Visit | Attending: Infectious Diseases | Admitting: Infectious Diseases

## 2024-03-10 DIAGNOSIS — Z1231 Encounter for screening mammogram for malignant neoplasm of breast: Secondary | ICD-10-CM | POA: Diagnosis present
